# Patient Record
Sex: Male | Born: 1950
Health system: Southern US, Community
[De-identification: ages and names within clinical notes are randomized; demographics above are authoritative.]

## PROBLEM LIST (undated history)

## (undated) DIAGNOSIS — M1990 Unspecified osteoarthritis, unspecified site: Secondary | ICD-10-CM

## (undated) DIAGNOSIS — R05 Cough: Secondary | ICD-10-CM

## (undated) DIAGNOSIS — K219 Gastro-esophageal reflux disease without esophagitis: Secondary | ICD-10-CM

## (undated) DIAGNOSIS — E669 Obesity, unspecified: Secondary | ICD-10-CM

## (undated) DIAGNOSIS — J189 Pneumonia, unspecified organism: Secondary | ICD-10-CM

## (undated) DIAGNOSIS — E785 Hyperlipidemia, unspecified: Secondary | ICD-10-CM

## (undated) DIAGNOSIS — I10 Essential (primary) hypertension: Secondary | ICD-10-CM

## (undated) DIAGNOSIS — Z5189 Encounter for other specified aftercare: Secondary | ICD-10-CM

## (undated) DIAGNOSIS — J309 Allergic rhinitis, unspecified: Secondary | ICD-10-CM

## (undated) DIAGNOSIS — R7302 Impaired glucose tolerance (oral): Secondary | ICD-10-CM

## (undated) DIAGNOSIS — J45909 Unspecified asthma, uncomplicated: Secondary | ICD-10-CM

## (undated) HISTORY — DX: Unspecified osteoarthritis, unspecified site: M19.90

## (undated) HISTORY — DX: Hyperlipidemia, unspecified: E78.5

## (undated) HISTORY — PX: DG FEMUR LEFT  (ARMC HX): HXRAD1508

## (undated) HISTORY — PX: LUMBAR DISC SURGERY: SHX700

## (undated) HISTORY — DX: Gastro-esophageal reflux disease without esophagitis: K21.9

## (undated) HISTORY — PX: TOOTH EXTRACTION: SUR596

## (undated) HISTORY — DX: Unspecified asthma, uncomplicated: J45.909

## (undated) HISTORY — DX: Obesity, unspecified: E66.9

## (undated) HISTORY — PX: BACK SURGERY: SHX140

## (undated) HISTORY — DX: Impaired glucose tolerance (oral): R73.02

## (undated) HISTORY — DX: Pneumonia, unspecified organism: J18.9

## (undated) HISTORY — DX: Cough: R05

## (undated) HISTORY — DX: Encounter for other specified aftercare: Z51.89

## (undated) HISTORY — PX: COLONOSCOPY: SHX174

## (undated) HISTORY — DX: Allergic rhinitis, unspecified: J30.9

---

## 1999-02-11 ENCOUNTER — Encounter: Payer: Self-pay | Admitting: Specialist

## 1999-02-11 ENCOUNTER — Ambulatory Visit (HOSPITAL_COMMUNITY): Admission: RE | Admit: 1999-02-11 | Discharge: 1999-02-11 | Payer: Self-pay | Admitting: Specialist

## 1999-08-09 ENCOUNTER — Ambulatory Visit (HOSPITAL_COMMUNITY): Admission: RE | Admit: 1999-08-09 | Discharge: 1999-08-09 | Payer: Self-pay | Admitting: Gastroenterology

## 1999-08-16 ENCOUNTER — Ambulatory Visit (HOSPITAL_COMMUNITY): Admission: RE | Admit: 1999-08-16 | Discharge: 1999-08-16 | Payer: Self-pay | Admitting: Nurse Practitioner

## 1999-08-16 ENCOUNTER — Encounter: Payer: Self-pay | Admitting: Internal Medicine

## 1999-11-04 ENCOUNTER — Ambulatory Visit (HOSPITAL_COMMUNITY): Admission: RE | Admit: 1999-11-04 | Discharge: 1999-11-04 | Payer: Self-pay | Admitting: Gastroenterology

## 2003-02-11 ENCOUNTER — Encounter: Payer: Self-pay | Admitting: Emergency Medicine

## 2003-02-11 ENCOUNTER — Inpatient Hospital Stay (HOSPITAL_COMMUNITY): Admission: EM | Admit: 2003-02-11 | Discharge: 2003-02-18 | Payer: Self-pay | Admitting: Emergency Medicine

## 2003-02-11 ENCOUNTER — Encounter: Payer: Self-pay | Admitting: Specialist

## 2003-04-21 ENCOUNTER — Ambulatory Visit (HOSPITAL_BASED_OUTPATIENT_CLINIC_OR_DEPARTMENT_OTHER): Admission: RE | Admit: 2003-04-21 | Discharge: 2003-04-21 | Payer: Self-pay | Admitting: Specialist

## 2003-06-23 ENCOUNTER — Encounter: Payer: Self-pay | Admitting: Specialist

## 2003-06-23 ENCOUNTER — Observation Stay (HOSPITAL_COMMUNITY): Admission: RE | Admit: 2003-06-23 | Discharge: 2003-06-24 | Payer: Self-pay | Admitting: Specialist

## 2004-12-06 ENCOUNTER — Ambulatory Visit: Payer: Self-pay | Admitting: Internal Medicine

## 2004-12-31 ENCOUNTER — Ambulatory Visit: Payer: Self-pay | Admitting: Internal Medicine

## 2005-01-02 ENCOUNTER — Ambulatory Visit: Payer: Self-pay | Admitting: Internal Medicine

## 2005-01-06 ENCOUNTER — Ambulatory Visit: Payer: Self-pay | Admitting: Internal Medicine

## 2005-01-29 ENCOUNTER — Ambulatory Visit: Payer: Self-pay | Admitting: Gastroenterology

## 2005-02-14 ENCOUNTER — Ambulatory Visit: Payer: Self-pay | Admitting: Gastroenterology

## 2006-01-27 ENCOUNTER — Ambulatory Visit: Payer: Self-pay | Admitting: Internal Medicine

## 2006-02-26 ENCOUNTER — Ambulatory Visit: Payer: Self-pay | Admitting: Internal Medicine

## 2006-04-06 ENCOUNTER — Ambulatory Visit: Payer: Self-pay | Admitting: Internal Medicine

## 2006-06-09 ENCOUNTER — Ambulatory Visit: Payer: Self-pay | Admitting: Internal Medicine

## 2006-08-17 ENCOUNTER — Ambulatory Visit: Payer: Self-pay | Admitting: Gastroenterology

## 2006-08-22 ENCOUNTER — Ambulatory Visit: Payer: Self-pay | Admitting: Internal Medicine

## 2006-11-02 ENCOUNTER — Ambulatory Visit: Payer: Self-pay | Admitting: Internal Medicine

## 2007-07-18 ENCOUNTER — Encounter: Payer: Self-pay | Admitting: Internal Medicine

## 2007-07-18 DIAGNOSIS — R059 Cough, unspecified: Secondary | ICD-10-CM

## 2007-07-18 DIAGNOSIS — E669 Obesity, unspecified: Secondary | ICD-10-CM

## 2007-07-18 DIAGNOSIS — R05 Cough: Secondary | ICD-10-CM

## 2007-07-18 DIAGNOSIS — K219 Gastro-esophageal reflux disease without esophagitis: Secondary | ICD-10-CM

## 2007-07-18 DIAGNOSIS — M1990 Unspecified osteoarthritis, unspecified site: Secondary | ICD-10-CM

## 2007-07-18 HISTORY — DX: Gastro-esophageal reflux disease without esophagitis: K21.9

## 2007-07-18 HISTORY — DX: Cough, unspecified: R05.9

## 2007-07-18 HISTORY — DX: Unspecified osteoarthritis, unspecified site: M19.90

## 2007-07-18 HISTORY — DX: Obesity, unspecified: E66.9

## 2007-10-14 ENCOUNTER — Ambulatory Visit: Payer: Self-pay | Admitting: Internal Medicine

## 2007-10-14 DIAGNOSIS — J019 Acute sinusitis, unspecified: Secondary | ICD-10-CM

## 2007-12-31 ENCOUNTER — Ambulatory Visit: Payer: Self-pay | Admitting: Internal Medicine

## 2007-12-31 ENCOUNTER — Telehealth (INDEPENDENT_AMBULATORY_CARE_PROVIDER_SITE_OTHER): Payer: Self-pay | Admitting: *Deleted

## 2007-12-31 DIAGNOSIS — J309 Allergic rhinitis, unspecified: Secondary | ICD-10-CM | POA: Insufficient documentation

## 2007-12-31 DIAGNOSIS — L989 Disorder of the skin and subcutaneous tissue, unspecified: Secondary | ICD-10-CM | POA: Insufficient documentation

## 2007-12-31 HISTORY — DX: Allergic rhinitis, unspecified: J30.9

## 2008-01-01 LAB — CONVERTED CEMR LAB
ALT: 55 units/L — ABNORMAL HIGH (ref 0–53)
AST: 31 units/L (ref 0–37)
Alkaline Phosphatase: 86 units/L (ref 39–117)
BUN: 16 mg/dL (ref 6–23)
Basophils Relative: 0.8 % (ref 0.0–1.0)
Bilirubin Urine: NEGATIVE
CO2: 28 meq/L (ref 19–32)
Calcium: 9.4 mg/dL (ref 8.4–10.5)
Creatinine, Ser: 1.2 mg/dL (ref 0.4–1.5)
Hemoglobin: 14.3 g/dL (ref 13.0–17.0)
Leukocytes, UA: NEGATIVE
Monocytes Relative: 14 % — ABNORMAL HIGH (ref 3.0–11.0)
Nitrite: NEGATIVE
Platelets: 246 10*3/uL (ref 150–400)
RDW: 12.5 % (ref 11.5–14.6)
Specific Gravity, Urine: >=1.03 (ref 1.000–1.03)
Total Bilirubin: 0.6 mg/dL (ref 0.3–1.2)
Total Protein, Urine: NEGATIVE mg/dL
Total Protein: 6.5 g/dL (ref 6.0–8.3)
Triglycerides: 154 mg/dL — ABNORMAL HIGH (ref 0–149)
Urine Glucose: NEGATIVE mg/dL
VLDL: 31 mg/dL (ref 0–40)
WBC: 6.7 10*3/uL (ref 4.5–10.5)
pH: 5 (ref 5.0–8.0)

## 2008-01-03 ENCOUNTER — Encounter (INDEPENDENT_AMBULATORY_CARE_PROVIDER_SITE_OTHER): Payer: Self-pay | Admitting: *Deleted

## 2008-04-15 ENCOUNTER — Ambulatory Visit: Payer: Self-pay | Admitting: Internal Medicine

## 2008-06-27 ENCOUNTER — Ambulatory Visit: Payer: Self-pay | Admitting: Internal Medicine

## 2008-10-11 ENCOUNTER — Ambulatory Visit: Payer: Self-pay | Admitting: Internal Medicine

## 2009-02-19 ENCOUNTER — Telehealth: Payer: Self-pay | Admitting: Gastroenterology

## 2009-03-06 ENCOUNTER — Ambulatory Visit: Payer: Self-pay | Admitting: Gastroenterology

## 2009-03-21 ENCOUNTER — Telehealth: Payer: Self-pay | Admitting: Gastroenterology

## 2009-04-27 ENCOUNTER — Telehealth: Payer: Self-pay | Admitting: Gastroenterology

## 2009-08-09 ENCOUNTER — Ambulatory Visit: Payer: Self-pay | Admitting: Internal Medicine

## 2009-08-09 DIAGNOSIS — S91009A Unspecified open wound, unspecified ankle, initial encounter: Secondary | ICD-10-CM

## 2009-08-09 DIAGNOSIS — S81809A Unspecified open wound, unspecified lower leg, initial encounter: Secondary | ICD-10-CM

## 2009-08-09 DIAGNOSIS — S81009A Unspecified open wound, unspecified knee, initial encounter: Secondary | ICD-10-CM

## 2009-09-08 ENCOUNTER — Encounter: Payer: Self-pay | Admitting: Family Medicine

## 2009-09-08 ENCOUNTER — Ambulatory Visit: Payer: Self-pay | Admitting: Family Medicine

## 2009-09-08 DIAGNOSIS — J069 Acute upper respiratory infection, unspecified: Secondary | ICD-10-CM | POA: Insufficient documentation

## 2009-09-08 DIAGNOSIS — J45909 Unspecified asthma, uncomplicated: Secondary | ICD-10-CM

## 2009-09-08 HISTORY — DX: Unspecified asthma, uncomplicated: J45.909

## 2009-09-26 ENCOUNTER — Telehealth: Payer: Self-pay | Admitting: Internal Medicine

## 2009-09-28 ENCOUNTER — Ambulatory Visit: Payer: Self-pay | Admitting: Internal Medicine

## 2009-10-01 LAB — CONVERTED CEMR LAB
LDL Cholesterol: 128 mg/dL — ABNORMAL HIGH (ref 0–99)
VLDL: 20.6 mg/dL (ref 0.0–40.0)

## 2009-12-21 ENCOUNTER — Telehealth: Payer: Self-pay | Admitting: Internal Medicine

## 2010-08-13 ENCOUNTER — Ambulatory Visit: Payer: Self-pay | Admitting: Internal Medicine

## 2010-08-13 ENCOUNTER — Encounter (INDEPENDENT_AMBULATORY_CARE_PROVIDER_SITE_OTHER): Payer: Self-pay | Admitting: *Deleted

## 2010-08-13 DIAGNOSIS — R109 Unspecified abdominal pain: Secondary | ICD-10-CM

## 2010-08-14 ENCOUNTER — Ambulatory Visit: Payer: Self-pay | Admitting: Internal Medicine

## 2010-08-14 ENCOUNTER — Encounter (INDEPENDENT_AMBULATORY_CARE_PROVIDER_SITE_OTHER): Payer: Self-pay | Admitting: *Deleted

## 2010-08-14 DIAGNOSIS — IMO0002 Reserved for concepts with insufficient information to code with codable children: Secondary | ICD-10-CM

## 2010-08-14 DIAGNOSIS — E785 Hyperlipidemia, unspecified: Secondary | ICD-10-CM | POA: Insufficient documentation

## 2010-08-14 HISTORY — DX: Hyperlipidemia, unspecified: E78.5

## 2010-08-14 LAB — CONVERTED CEMR LAB
ALT: 31 units/L (ref 0–53)
AST: 22 units/L (ref 0–37)
BUN: 19 mg/dL (ref 6–23)
Bilirubin Urine: NEGATIVE
Bilirubin, Direct: 0.2 mg/dL (ref 0.0–0.3)
Eosinophils Relative: 1.9 % (ref 0.0–5.0)
GFR calc non Af Amer: 79.84 mL/min (ref >60)
HCT: 45.1 % (ref 39.0–52.0)
Hemoglobin, Urine: NEGATIVE
Ketones, ur: NEGATIVE mg/dL
Lymphs Abs: 2.2 10*3/uL (ref 0.7–4.0)
Monocytes Relative: 9 % (ref 3.0–12.0)
Platelets: 209 10*3/uL (ref 150.0–400.0)
Potassium: 4.1 meq/L (ref 3.5–5.1)
Sodium: 141 meq/L (ref 135–145)
Total Bilirubin: 0.8 mg/dL (ref 0.3–1.2)
Total Protein, Urine: NEGATIVE mg/dL
Urine Glucose: NEGATIVE mg/dL
WBC: 10.1 10*3/uL (ref 4.5–10.5)

## 2010-08-15 ENCOUNTER — Encounter: Payer: Self-pay | Admitting: Internal Medicine

## 2010-08-15 LAB — CONVERTED CEMR LAB
Cholesterol: 213 mg/dL — ABNORMAL HIGH (ref 0–200)
HDL: 43.3 mg/dL (ref >39.00)
Total CHOL/HDL Ratio: 5
Triglycerides: 166 mg/dL — ABNORMAL HIGH (ref 0.0–149.0)
VLDL: 33.2 mg/dL (ref 0.0–40.0)

## 2010-09-02 ENCOUNTER — Telehealth: Payer: Self-pay | Admitting: Internal Medicine

## 2010-10-21 ENCOUNTER — Ambulatory Visit: Payer: Self-pay | Admitting: Internal Medicine

## 2010-11-18 ENCOUNTER — Encounter: Payer: Self-pay | Admitting: Internal Medicine

## 2010-12-31 NOTE — Assessment & Plan Note (Signed)
Summary: COLD/ NWS   Vital Signs:  Patient profile:   60 year old male Height:      73 inches Weight:      229 pounds BMI:     30.32 O2 Sat:      96 % on Room air Temp:     98.5 degrees F oral Pulse rate:   79 / minute BP sitting:   118 / 80  (left arm) Cuff size:   large  Vitals Entered By: Zella Ball Ewing CMA (AAMA) (August 13, 2010 3:00 PM)  O2 Flow:  Room air CC: Cough, congestion headaches, sweating/RE   Primary Care Laurene Melendrez:  Oliver Barre, MD  CC:  Cough, congestion headaches, and sweating/RE.  History of Present Illness: here with acute illness for 2-3 days, mild to mod with headache, sinus pain, pressure, fever  and greneish d/c, general malaise and weakness, sweats, and today with increased prod cough grenish sputum;  Pt denies CP, worsening sob, doe, wheezing, orthopnea, pnd, worsening LE edema, palps, dizziness or syncope  Pt denies new neuro symptoms such as headache, facial or extremity weakness  Needs nexium refill - has recurrent daily reflux despite diet control and no dysphgia, n/v, abd pain, blood, except for fleeting pains to the LLQ, sharp without bowel change or blood.  Has known umbilical hernia without incr pain or swelling, and discomfort mild worse to walking on the treadmill;  No wt loss, night sweats, loss of appetite or other constitutional symptoms  Denies bladder changes  Problems Prior to Update: 1)  Abdominal Pain, Lower  (ICD-789.09) 2)  Bronchitis-acute  (ICD-466.0) 3)  Uri  (ICD-465.9) 4)  Extrinsic Asthma, Unspecified  (ICD-493.00) 5)  Viral Uri  (ICD-465.9) 6)  Open Wound of Knee Leg and Ankle Complicated  (ICD-891.1) 7)  Observation For Suspected Malignant Neoplasm  (ICD-V71.1) 8)  Skin Lesion  (ICD-709.9) 9)  Allergic Rhinitis  (ICD-477.9) 10)  Sinusitis- Acute-nos  (ICD-461.9) 11)  Preventive Health Care  (ICD-V70.0) 12)  Degenerative Joint Disease  (ICD-715.90) 13)  Obesity  (ICD-278.00) 14)  Gerd  (ICD-530.81) 15)  Cough, Chronic   (ICD-786.2)  Medications Prior to Update: 1)  Nexium 40 Mg  Cpdr (Esomeprazole Magnesium) .Marland Kitchen.. 1 By Mouth Qd 2)  Naproxen 500 Mg  Tabs (Naproxen) .Marland Kitchen.. 1 By Mouth Two Times A Day Prn 3)  Proventil Hfa 108 (90 Base) Mcg/act Aers (Albuterol Sulfate) .... Inhale 2 Puff As Directed Four Times A Day 4)  Fluticasone Propionate 50 Mcg/act  Susp (Fluticasone Propionate) .... 2 Spray/side Once Daily 5)  Robaxin 500 Mg Tabs (Methocarbamol) .... As Needed 6)  Fish Oil  Oil (Fish Oil) .... Take 1 Tablet By Mouth Once A Day 7)  Multivitamins  Tabs (Multiple Vitamin) .... Take 1 Tablet By Mouth Once A Day 8)  Glucosamine 1500 Complex  Caps (Glucosamine-Chondroit-Vit C-Mn) .... Take 1 Tablet By Mouth Once A Day 9)  Doxycycline Hyclate 100 Mg Caps (Doxycycline Hyclate) .Marland Kitchen.. 1 By Mouth Two Times A Day 10)  Prednisone 20 Mg Tabs (Prednisone) .... Uad 11)  Hydromet 5-1.5 Mg/53ml Syrp (Hydrocodone-Homatropine) .Marland Kitchen.. 1 or 2 Tsps Three Times A Day As Needed Cough  Current Medications (verified): 1)  Nexium 40 Mg  Cpdr (Esomeprazole Magnesium) .Marland Kitchen.. 1 By Mouth Once Daily 2)  Naproxen 500 Mg  Tabs (Naproxen) .Marland Kitchen.. 1 By Mouth Two Times A Day Prn 3)  Proventil Hfa 108 (90 Base) Mcg/act Aers (Albuterol Sulfate) .... Inhale 2 Puff As Directed Four Times A Day 4)  Fluticasone Propionate 50 Mcg/act  Susp (Fluticasone Propionate) .... 2 Spray/side Once Daily 5)  Robaxin 500 Mg Tabs (Methocarbamol) .... As Needed 6)  Fish Oil  Oil (Fish Oil) .... Take 1 Tablet By Mouth Once A Day 7)  Multivitamins  Tabs (Multiple Vitamin) .... Take 1 Tablet By Mouth Once A Day 8)  Glucosamine 1500 Complex  Caps (Glucosamine-Chondroit-Vit C-Mn) .... Take 1 Tablet By Mouth Once A Day 9)  Doxycycline Hyclate 100 Mg Caps (Doxycycline Hyclate) .Marland Kitchen.. 1 By Mouth Two Times A Day 10)  Hydromet 5-1.5 Mg/24ml Syrp (Hydrocodone-Homatropine) .Marland Kitchen.. 1 or 2 Tsps Three Times A Day As Needed Cough  Allergies (verified): No Known Drug Allergies  Past  History:  Past Medical History: Last updated: 04/15/2008 Chronic Cough GERD Obesity DJD Allergic rhinitis hx pneumonia  Past Surgical History: Last updated: 03/06/2009 Rods in L Leg-2004 L Knee Surgery- 2004 R Knee Surgery-1970  Social History: Last updated: 03/06/2009 Former Smoker-Marijuana 30 years Alcohol use-yes Married, lives with wife and: 1 daughter and children live with him Daily Caffeine Use coffee in AM Illicit Drug Use - no  Risk Factors: Smoking Status: quit (10/14/2007)  Review of Systems       all otherwise negative per pt -    Physical Exam  General:  alert and overweight-appearing, mild ill .   Head:  normocephalic and atraumatic.   Eyes:  vision grossly intact, pupils equal, and pupils round.   Ears:  bilat tms' mild erythena, sinus tender bilat max area Nose:  nasal dischargemucosal pallor and mucosal edema.   Mouth:  pharyngeal erythema and fair dentition.   Neck:  supple and no masses.   Lungs:  normal respiratory effort and normal breath sounds.   Heart:  normal rate and regular rhythm.   Abdomen:  soft, non-tender, and normal bowel sounds.  including NT and reducible umbilical hernia Msk:  no joint tenderness and no joint swelling.   Extremities:  no edema, no erythema    Impression & Recommendations:  Problem # 1:  SINUSITIS- ACUTE-NOS (ICD-461.9)  His updated medication list for this problem includes:    Fluticasone Propionate 50 Mcg/act Susp (Fluticasone propionate) .Marland Kitchen... 2 spray/side once daily    Doxycycline Hyclate 100 Mg Caps (Doxycycline hyclate) .Marland Kitchen... 1 by mouth two times a day    Hydromet 5-1.5 Mg/20ml Syrp (Hydrocodone-homatropine) .Marland Kitchen... 1 or 2 tsps three times a day as needed cough treat as above, f/u any worsening signs or symptoms , also for antibx  Problem # 2:  BRONCHITIS-ACUTE (ICD-466.0)  His updated medication list for this problem includes:    Proventil Hfa 108 (90 Base) Mcg/act Aers (Albuterol sulfate) .....  Inhale 2 puff as directed four times a day    Doxycycline Hyclate 100 Mg Caps (Doxycycline hyclate) .Marland Kitchen... 1 by mouth two times a day    Hydromet 5-1.5 Mg/31ml Syrp (Hydrocodone-homatropine) .Marland Kitchen... 1 or 2 tsps three times a day as needed cough  Orders: T-2 View CXR, Same Day (71020.5TC) cant r/o pna - for cxr, and treat as above, f/u any worsening signs or symptoms   Problem # 3:  ABDOMINAL PAIN, LOWER (ICD-789.09)  His updated medication list for this problem includes:    Naproxen 500 Mg Tabs (Naproxen) .Marland Kitchen... 1 by mouth two times a day prn    Robaxin 500 Mg Tabs (Methocarbamol) .Marland Kitchen... As needed mid and lower - no GI/GU symptoms - ? msk - no sign of hernia except for the umbilical - ok to follow for now  Complete Medication List: 1)  Nexium 40 Mg Cpdr (Esomeprazole magnesium) .Marland Kitchen.. 1 by mouth once daily 2)  Naproxen 500 Mg Tabs (Naproxen) .Marland Kitchen.. 1 by mouth two times a day prn 3)  Proventil Hfa 108 (90 Base) Mcg/act Aers (Albuterol sulfate) .... Inhale 2 puff as directed four times a day 4)  Fluticasone Propionate 50 Mcg/act Susp (Fluticasone propionate) .... 2 spray/side once daily 5)  Robaxin 500 Mg Tabs (Methocarbamol) .... As needed 6)  Fish Oil Oil (Fish oil) .... Take 1 tablet by mouth once a day 7)  Multivitamins Tabs (Multiple vitamin) .... Take 1 tablet by mouth once a day 8)  Glucosamine 1500 Complex Caps (Glucosamine-chondroit-vit c-mn) .... Take 1 tablet by mouth once a day 9)  Doxycycline Hyclate 100 Mg Caps (Doxycycline hyclate) .Marland Kitchen.. 1 by mouth two times a day 10)  Hydromet 5-1.5 Mg/91ml Syrp (Hydrocodone-homatropine) .Marland Kitchen.. 1 or 2 tsps three times a day as needed cough  Patient Instructions: 1)  Please take all new medications as prescribed 2)  Continue all previous medications as before this visit  3)  Please go to Radiology in the basement level for your X-Ray today  4)  Continue all previous medications as before this visit  5)  Please call the number on the [facility] Card for  results of your testing  6)  Please schedule a follow-up appointment as needed. 7)  You had the flu shot today, and the nexium refill Prescriptions: NEXIUM 40 MG  CPDR (ESOMEPRAZOLE MAGNESIUM) 1 by mouth once daily  #90 x 3   Entered and Authorized by:   Corwin Levins MD   Signed by:   Corwin Levins MD on 08/13/2010   Method used:   Electronically to        [facility] [id number] (retail)       [address] [place], [place]  [address]       Ph: [phone]       Fax: [phone]   RxID:   [id number] HYDROMET 5-1.5 MG/5ML SYRP (HYDROCODONE-HOMATROPINE) 1 or 2 tsps three times a day as needed cough  #6oz x 1   Entered and Authorized by:   Corwin Levins MD   Signed by:   Corwin Levins MD on 08/13/2010   Method used:   Print then Give to Patient   RxID:   [id number] DOXYCYCLINE HYCLATE 100 MG CAPS (DOXYCYCLINE HYCLATE) 1 by mouth two times a day  #20 x 0   Entered and Authorized by:   Corwin Levins MD   Signed by:   Corwin Levins MD on 08/13/2010   Method used:   Print then Give to Patient   RxID:   [id number]

## 2010-12-31 NOTE — Progress Notes (Signed)
Summary: Rx refill req  Phone Note Call from Patient Call back at Home Phone [phone]   Caller: Patient Call For: Corwin Levins MD Summary of Call: Pt requests Nexium. Pt states his GI M.D told him Dr Jonny Ruiz would need to fill this. [facility] for 90 days. [phone] phone/or fax. Initial call taken by: Verdell Face,  September 02, 2010 2:22 PM    Prescriptions: NEXIUM 40 MG  CPDR (ESOMEPRAZOLE MAGNESIUM) 1 by mouth once daily  #90 x 3   Entered by:   Margaret Pyle, CMA   Authorized by:   Corwin Levins MD   Signed by:   Margaret Pyle, CMA on 09/02/2010   Method used:   Faxed to ...       [facility] (mail-order)             , [place]         Ph: [phone]       Fax: [phone]   RxID:   [id number]

## 2010-12-31 NOTE — Letter (Signed)
Summary: Out of Work  [facility]  [address] [place], [place] [address]   Phone: [phone]  Fax: [phone]    August 13, 2010   Employee:  [name] Brainerd    To Whom It May Concern:   For Medical reasons, please excuse the above named employee from work for the following dates:  Start:   08/13/2010  End:   08/14/2010  Return to work on 08/15/2010  If you need additional information, please feel free to contact our office.         Sincerely,    Dr. Oliver Barre

## 2010-12-31 NOTE — Assessment & Plan Note (Signed)
Summary: FEVER--NOT BETTER SAW DR Sammuel Cooper YESTERDAY--[facility]   Vital Signs:  Patient profile:   60 year old male Height:      73 inches Weight:      228 pounds BMI:     30.19 O2 Sat:      98 % on Room air Temp:     97.8 degrees F oral Pulse rate:   83 / minute BP sitting:   120 / 60  (left arm) Cuff size:   large  Vitals Entered By: Zella Ball Ewing CMA Duncan Dull) (August 14, 2010 3:53 PM)  O2 Flow:  Room air CC: Nausea, dizzy, fever/RE   Primary Care Provider:  Oliver Barre, MD  CC:  Nausea, dizzy, and fever/RE.  History of Present Illness: here to f/u;  to me seems more agitated nervous state but he denies this;  very fidgety, cant sit still  - "I'm burning up" though no fever, rash, swelling, tongue sweling, sob, abd pain , v/d.  States overall cough improved, but nausea and just "feeling bad" causing him significant distress.  Pt denies CP, worsening sob, doe, wheezing, orthopnea, pnd, worsening LE edema, palps, dizziness or syncope  Pt denies new neuro symptoms such as headache, facial or extremity weakness  No wt loss, night sweats, loss of appetite or other constitutional symptoms but does have lower appetitie.   Problems Prior to Update: 1)  Hyperlipidemia  (ICD-272.4) 2)  Agitation  (ICD-307.9) 3)  Abdominal Pain, Lower  (ICD-789.09) 4)  Bronchitis-acute  (ICD-466.0) 5)  Uri  (ICD-465.9) 6)  Extrinsic Asthma, Unspecified  (ICD-493.00) 7)  Viral Uri  (ICD-465.9) 8)  Open Wound of Knee Leg and Ankle Complicated  (ICD-891.1) 9)  Observation For Suspected Malignant Neoplasm  (ICD-V71.1) 10)  Skin Lesion  (ICD-709.9) 11)  Allergic Rhinitis  (ICD-477.9) 12)  Sinusitis- Acute-nos  (ICD-461.9) 13)  Preventive Health Care  (ICD-V70.0) 14)  Degenerative Joint Disease  (ICD-715.90) 15)  Obesity  (ICD-278.00) 16)  Gerd  (ICD-530.81) 17)  Cough, Chronic  (ICD-786.2)  Medications Prior to Update: 1)  Nexium 40 Mg  Cpdr (Esomeprazole Magnesium) .Marland Kitchen.. 1 By Mouth Once Daily 2)  Naproxen 500  Mg  Tabs (Naproxen) .Marland Kitchen.. 1 By Mouth Two Times A Day Prn 3)  Proventil Hfa 108 (90 Base) Mcg/act Aers (Albuterol Sulfate) .... Inhale 2 Puff As Directed Four Times A Day 4)  Fluticasone Propionate 50 Mcg/act  Susp (Fluticasone Propionate) .... 2 Spray/side Once Daily 5)  Robaxin 500 Mg Tabs (Methocarbamol) .... As Needed 6)  Fish Oil  Oil (Fish Oil) .... Take 1 Tablet By Mouth Once A Day 7)  Multivitamins  Tabs (Multiple Vitamin) .... Take 1 Tablet By Mouth Once A Day 8)  Glucosamine 1500 Complex  Caps (Glucosamine-Chondroit-Vit C-Mn) .... Take 1 Tablet By Mouth Once A Day 9)  Doxycycline Hyclate 100 Mg Caps (Doxycycline Hyclate) .Marland Kitchen.. 1 By Mouth Two Times A Day 10)  Hydromet 5-1.5 Mg/22ml Syrp (Hydrocodone-Homatropine) .Marland Kitchen.. 1 or 2 Tsps Three Times A Day As Needed Cough  Current Medications (verified): 1)  Nexium 40 Mg  Cpdr (Esomeprazole Magnesium) .Marland Kitchen.. 1 By Mouth Once Daily 2)  Naproxen 500 Mg  Tabs (Naproxen) .Marland Kitchen.. 1 By Mouth Two Times A Day Prn 3)  Proventil Hfa 108 (90 Base) Mcg/act Aers (Albuterol Sulfate) .... Inhale 2 Puff As Directed Four Times A Day 4)  Fluticasone Propionate 50 Mcg/act  Susp (Fluticasone Propionate) .... 2 Spray/side Once Daily 5)  Robaxin 500 Mg Tabs (Methocarbamol) .... As Needed 6)  Fish Oil  Oil (Fish Oil) .... Take 1 Tablet By Mouth Once A Day 7)  Multivitamins  Tabs (Multiple Vitamin) .... Take 1 Tablet By Mouth Once A Day 8)  Glucosamine 1500 Complex  Caps (Glucosamine-Chondroit-Vit C-Mn) .... Take 1 Tablet By Mouth Once A Day 9)  Hydromet 5-1.5 Mg/64ml Syrp (Hydrocodone-Homatropine) .Marland Kitchen.. 1 or 2 Tsps Three Times A Day As Needed Cough 10)  Azithromycin 250 Mg Tabs (Azithromycin) .... 2po Qd For 1 Day, Then 1po Qd For 4days, Then Stop 11)  Promethazine Hcl 25 Mg Tabs (Promethazine Hcl) .Marland Kitchen.. 1 By Mouth Q 6 Hrs As Needed Nausea  Allergies (verified): No Known Drug Allergies  Past History:  Past Medical History: Last updated: 04/15/2008 Chronic  Cough GERD Obesity DJD Allergic rhinitis hx pneumonia  Past Surgical History: Last updated: 03/06/2009 Rods in L Leg-2004 L Knee Surgery- 2004 R Knee Surgery-1970  Social History: Last updated: 03/06/2009 Former Smoker-Marijuana 30 years Alcohol use-yes Married, lives with wife and: 1 daughter and children live with him Daily Caffeine Use coffee in AM Illicit Drug Use - no  Risk Factors: Smoking Status: quit (10/14/2007)  Review of Systems       all otherwise negative per pt -    Physical Exam  General:  alert and overweight-appearing.  , agitated and animated, cant seem to sit still,keeps changing position such as sitting , lying on his front or back Head:  normocephalic and atraumatic.   Eyes:  vision grossly intact, pupils equal, and pupils round.   Ears:  bilat tm's mild erythema, sinus nontender Nose:  no external deformity and no nasal discharge.   Mouth:  no gingival abnormalities and pharyngeal erythema.   Neck:  supple and no masses.   Lungs:  normal respiratory effort and normal breath sounds.   Heart:  normal rate and regular rhythm.   Abdomen:  soft, non-tender, normal bowel sounds, no distention, and no guarding.   Msk:  no joint tenderness and no joint swelling.   Extremities:  no edema, no erythema  Skin:  color normal and no rashes.   Psych:  as above   Impression & Recommendations:  Problem # 1:  BRONCHITIS-ACUTE (ICD-466.0)  The following medications were removed from the medication list:    Doxycycline Hyclate 100 Mg Caps (Doxycycline hyclate) .Marland Kitchen... 1 by mouth two times a day His updated medication list for this problem includes:    Proventil Hfa 108 (90 Base) Mcg/act Aers (Albuterol sulfate) ..... Inhale 2 puff as directed four times a day    Hydromet 5-1.5 Mg/76ml Syrp (Hydrocodone-homatropine) .Marland Kitchen... 1 or 2 tsps three times a day as needed cough    Azithromycin 250 Mg Tabs (Azithromycin) .Marland Kitchen... 2po qd for 1 day, then 1po qd for 4days, then  stop ? improved - to Continue all previous medications as before this visit except stop the doxy with the c/o nausea, start the zpack , cxr reviewed with pt., f/u any worsening symptoms  Problem # 2:  ABDOMINAL PAIN, LOWER (ICD-789.09)  His updated medication list for this problem includes:    Naproxen 500 Mg Tabs (Naproxen) .Marland Kitchen... 1 by mouth two times a day prn    Robaxin 500 Mg Tabs (Methocarbamol) .Marland Kitchen... As needed exam essentially no change but with the nausea and feeling overall somehow worse today with "feverish" and nausea - will check labs, urine cx, blood cx;  ok for tylenol/advil as needed   Orders: T-Culture, Urine ([id number]) T-Blood Culture ([id number]) TLB-BMP (Basic Metabolic Panel-BMET) (80048-METABOL)  TLB-CBC Platelet - w/Differential (85025-CBCD) TLB-Hepatic/Liver Function Pnl (80076-HEPATIC) TLB-Udip w/ Micro (81001-URINE) TLB-Lipase (83690-LIPASE)  Problem # 3:  AGITATION (ICD-307.9) cannot seem to sit still during the exam, "I'm burning up" - no fever today in the office, exam little change, ? anxiety - declines further med trial  Complete Medication List: 1)  Nexium 40 Mg Cpdr (Esomeprazole magnesium) .Marland Kitchen.. 1 by mouth once daily 2)  Naproxen 500 Mg Tabs (Naproxen) .Marland Kitchen.. 1 by mouth two times a day prn 3)  Proventil Hfa 108 (90 Base) Mcg/act Aers (Albuterol sulfate) .... Inhale 2 puff as directed four times a day 4)  Fluticasone Propionate 50 Mcg/act Susp (Fluticasone propionate) .... 2 spray/side once daily 5)  Robaxin 500 Mg Tabs (Methocarbamol) .... As needed 6)  Fish Oil Oil (Fish oil) .... Take 1 tablet by mouth once a day 7)  Multivitamins Tabs (Multiple vitamin) .... Take 1 tablet by mouth once a day 8)  Glucosamine 1500 Complex Caps (Glucosamine-chondroit-vit c-mn) .... Take 1 tablet by mouth once a day 9)  Hydromet 5-1.5 Mg/33ml Syrp (Hydrocodone-homatropine) .Marland Kitchen.. 1 or 2 tsps three times a day as needed cough 10)  Azithromycin 250 Mg Tabs (Azithromycin) ....  2po qd for 1 day, then 1po qd for 4days, then stop 11)  Promethazine Hcl 25 Mg Tabs (Promethazine hcl) .Marland Kitchen.. 1 by mouth q 6 hrs as needed nausea  Other Orders: TLB-Lipid Panel (80061-LIPID)  Patient Instructions: 1)  Please take all new medications as prescribed - the  antibx, and nausea medication 2)  Ok to take tylenol or advil for feverishness 3)  Continue all previous medications as before this visit , except should stop the doxycycline as this can cause nausea 4)  Please go to the Lab in the basement for your blood and/or urine tests today 5)  /Please call the number on the [facility] Card for results of your testing 6)  Please schedule a follow-up appointment as needed. Prescriptions: PROMETHAZINE HCL 25 MG TABS (PROMETHAZINE HCL) 1 by mouth q 6 hrs as needed nausea  #40 x 1   Entered and Authorized by:   Corwin Levins MD   Signed by:   Corwin Levins MD on 08/14/2010   Method used:   Print then Give to Patient   RxID:   [id number] AZITHROMYCIN 250 MG TABS (AZITHROMYCIN) 2po qd for 1 day, then 1po qd for 4days, then stop  #6 x 1   Entered and Authorized by:   Corwin Levins MD   Signed by:   Corwin Levins MD on 08/14/2010   Method used:   Print then Give to Patient   RxID:   [id number]

## 2010-12-31 NOTE — Assessment & Plan Note (Signed)
Summary: head congestion/cd   Vital Signs:  Patient profile:   60 year old male Height:      73 inches Weight:      234 pounds BMI:     30.98 O2 Sat:      97 % on Room air Temp:     98.8 degrees F oral Pulse rate:   72 / minute BP sitting:   118 / 80  (left arm) Cuff size:   large  Vitals Entered By: Zella Ball Ewing CMA Duncan Dull) (October 21, 2010 4:53 PM)  O2 Flow:  Room air  CC: head Congestion, sore throat, ear pain/RE   Primary Care Provider:  Oliver Barre, MD  CC:  head Congestion, sore throat, and ear pain/RE.  History of Present Illness: here with 3 days onset facial pain, pressure, fever and greenish d/c mild to mod, with slight ST, but Pt denies CP, worsening sob, doe, wheezing, orthopnea, pnd, worsening LE edema, palps, dizziness or syncope  Pt denies new neuro symptoms such as headache, facial or extremity weakness  Pt denies polydipsia, polyuria.  Problems Prior to Update: 1)  Sinusitis- Acute-nos  (ICD-461.9) 2)  Hyperlipidemia  (ICD-272.4) 3)  Agitation  (ICD-307.9) 4)  Abdominal Pain, Lower  (ICD-789.09) 5)  Uri  (ICD-465.9) 6)  Extrinsic Asthma, Unspecified  (ICD-493.00) 7)  Viral Uri  (ICD-465.9) 8)  Open Wound of Knee Leg and Ankle Complicated  (ICD-891.1) 9)  Observation For Suspected Malignant Neoplasm  (ICD-V71.1) 10)  Skin Lesion  (ICD-709.9) 11)  Allergic Rhinitis  (ICD-477.9) 12)  Sinusitis- Acute-nos  (ICD-461.9) 13)  Preventive Health Care  (ICD-V70.0) 14)  Degenerative Joint Disease  (ICD-715.90) 15)  Obesity  (ICD-278.00) 16)  Gerd  (ICD-530.81) 17)  Cough, Chronic  (ICD-786.2)  Medications Prior to Update: 1)  Nexium 40 Mg  Cpdr (Esomeprazole Magnesium) .Marland Kitchen.. 1 By Mouth Once Daily 2)  Naproxen 500 Mg  Tabs (Naproxen) .Marland Kitchen.. 1 By Mouth Two Times A Day Prn 3)  Proventil Hfa 108 (90 Base) Mcg/act Aers (Albuterol Sulfate) .... Inhale 2 Puff As Directed Four Times A Day 4)  Fluticasone Propionate 50 Mcg/act  Susp (Fluticasone Propionate) .... 2  Spray/side Once Daily 5)  Robaxin 500 Mg Tabs (Methocarbamol) .... As Needed 6)  Fish Oil  Oil (Fish Oil) .... Take 1 Tablet By Mouth Once A Day 7)  Multivitamins  Tabs (Multiple Vitamin) .... Take 1 Tablet By Mouth Once A Day 8)  Glucosamine 1500 Complex  Caps (Glucosamine-Chondroit-Vit C-Mn) .... Take 1 Tablet By Mouth Once A Day 9)  Hydromet 5-1.5 Mg/24ml Syrp (Hydrocodone-Homatropine) .Marland Kitchen.. 1 or 2 Tsps Three Times A Day As Needed Cough 10)  Azithromycin 250 Mg Tabs (Azithromycin) .... 2po Qd For 1 Day, Then 1po Qd For 4days, Then Stop 11)  Promethazine Hcl 25 Mg Tabs (Promethazine Hcl) .Marland Kitchen.. 1 By Mouth Q 6 Hrs As Needed Nausea  Current Medications (verified): 1)  Nexium 40 Mg  Cpdr (Esomeprazole Magnesium) .Marland Kitchen.. 1 By Mouth Once Daily 2)  Naproxen 500 Mg  Tabs (Naproxen) .Marland Kitchen.. 1 By Mouth Two Times A Day Prn 3)  Fluticasone Propionate 50 Mcg/act  Susp (Fluticasone Propionate) .... 2 Spray/side Once Daily 4)  Robaxin 500 Mg Tabs (Methocarbamol) .... As Needed 5)  Fish Oil  Oil (Fish Oil) .... Take 1 Tablet By Mouth Once A Day 6)  Glucosamine 1500 Complex  Caps (Glucosamine-Chondroit-Vit C-Mn) .... Take 1 Tablet By Mouth Once A Day 7)  Hydromet 5-1.5 Mg/78ml Syrp (Hydrocodone-Homatropine) .Marland Kitchen.. 1 or 2  Tsps Three Times A Day As Needed Cough 8)  Levofloxacin 500 Mg Tabs (Levofloxacin) .Marland Kitchen.. 1po Once Daily  Allergies (verified): No Known Drug Allergies  Past History:  Past Medical History: Last updated: 04/15/2008 Chronic Cough GERD Obesity DJD Allergic rhinitis hx pneumonia  Past Surgical History: Last updated: 03/06/2009 Rods in L Leg-2004 L Knee Surgery- 2004 R Knee Surgery-1970  Social History: Last updated: 03/06/2009 Former Smoker-Marijuana 30 years Alcohol use-yes Married, lives with wife and: 1 daughter and children live with him Daily Caffeine Use coffee in AM Illicit Drug Use - no  Risk Factors: Smoking Status: quit (10/14/2007)  Review of Systems       all  otherwise negative per pt -    Physical Exam  General:  alert and overweight-appearing.  , mild ill  Head:  normocephalic and atraumatic.   Eyes:  vision grossly intact, pupils equal, and pupils round.   Ears:  bilat tm's red, sinus tender bialt Nose:  nasal dischargemucosal pallor and mucosal edema.   Mouth:  pharyngeal erythema and fair dentition.   Neck:  supple and no masses.   Lungs:  normal respiratory effort and normal breath sounds.   Heart:  normal rate and regular rhythm.   Extremities:  no edema, no erythema    Impression & Recommendations:  Problem # 1:  SINUSITIS- ACUTE-NOS (ICD-461.9)  The following medications were removed from the medication list:    Azithromycin 250 Mg Tabs (Azithromycin) .Marland Kitchen... 2po qd for 1 day, then 1po qd for 4days, then stop His updated medication list for this problem includes:    Fluticasone Propionate 50 Mcg/act Susp (Fluticasone propionate) .Marland Kitchen... 2 spray/side once daily    Hydromet 5-1.5 Mg/61ml Syrp (Hydrocodone-homatropine) .Marland Kitchen... 1 or 2 tsps three times a day as needed cough    Levofloxacin 500 Mg Tabs (Levofloxacin) .Marland Kitchen... 1po once daily treat as above, f/u any worsening signs or symptoms   Complete Medication List: 1)  Nexium 40 Mg Cpdr (Esomeprazole magnesium) .Marland Kitchen.. 1 by mouth once daily 2)  Naproxen 500 Mg Tabs (Naproxen) .Marland Kitchen.. 1 by mouth two times a day prn 3)  Fluticasone Propionate 50 Mcg/act Susp (Fluticasone propionate) .... 2 spray/side once daily 4)  Robaxin 500 Mg Tabs (Methocarbamol) .... As needed 5)  Fish Oil Oil (Fish oil) .... Take 1 tablet by mouth once a day 6)  Glucosamine 1500 Complex Caps (Glucosamine-chondroit-vit c-mn) .... Take 1 tablet by mouth once a day 7)  Hydromet 5-1.5 Mg/37ml Syrp (Hydrocodone-homatropine) .Marland Kitchen.. 1 or 2 tsps three times a day as needed cough 8)  Levofloxacin 500 Mg Tabs (Levofloxacin) .Marland Kitchen.. 1po once daily  Patient Instructions: 1)  Please take all new medications as prescribed  2)  Continue all  previous medications as before this visit 3)  You can also use Mucinex OTC or it's generic for congestion  4)  Please schedule a follow-up appointment as needed. Prescriptions: LEVOFLOXACIN 500 MG TABS (LEVOFLOXACIN) 1po once daily  #10 x 0   Entered and Authorized by:   Corwin Levins MD   Signed by:   Corwin Levins MD on 10/21/2010   Method used:   Print then Give to Patient   RxID:   [id number]    Orders Added: 1)  Est. Patient Level III [95093]

## 2010-12-31 NOTE — Progress Notes (Signed)
  Phone Note Refill Request  on December 21, 2009 8:31 AM  Refills Requested: Medication #1:  PROVENTIL HFA 108 (90 BASE) MCG/ACT AERS Inhale 2 puff as directed four times a day   Dosage confirmed as above?Dosage Confirmed   Notes: [facility] Initial call taken by: Scharlene Gloss,  December 21, 2009 8:31 AM    Prescriptions: FLUTICASONE PROPIONATE 50 MCG/ACT  SUSP (FLUTICASONE PROPIONATE) 2 spray/side once daily  #1 x 10   Entered by:   Scharlene Gloss   Authorized by:   Corwin Levins MD   Signed by:   Scharlene Gloss on 12/21/2009   Method used:   Faxed to ...       [facility]* (mail-order)             ,          Ph: [phone]       Fax: [phone]   RxID:   [id number]

## 2010-12-31 NOTE — Letter (Signed)
Summary: Out of Work  [facility]  [address] [place], [place] [address]   Phone: [phone]  Fax: [phone]    August 14, 2010   Employee:  EWART CARRERA Dubach    To Whom It May Concern:   For Medical reasons, please excuse the above named employee from work for the following dates:  Start:   08/12/2010  End:   08/15/2010  Return to work on Friday 08/16/2010  If you need additional information, please feel free to contact our office.         Sincerely,    Dr. Oliver Barre

## 2011-01-02 NOTE — Letter (Signed)
Summary: Primary Care Appointment Letter  [facility]  [address] [place], [place] [address]   Phone: [phone]  Fax: [phone]    11/18/2010 MRN: [id number]  [facility] [address] [place], [place]  [address]  Dear Mr. Shuttleworth,   Your Primary Care Physician Corwin Levins MD has indicated that:    ____X___it is time to schedule an appointment with Dr. Jonny Ruiz for your physical with labs in June 2012.  Please call the office to schedule.    _______you missed your appointment on______ and need to call and          reschedule.    _______you need to have lab work done.    _______you need to schedule an appointment discuss lab or test results.    _______you need to call to reschedule your appointment that is                       scheduled on _________.     Please call our office as soon as possible. Our phone number is [phone]. Please press option 1. Our office is open 8a-12noon and 1p-5p, Monday through Friday.     Thank you,    [facility]

## 2011-03-04 ENCOUNTER — Other Ambulatory Visit: Payer: Self-pay | Admitting: Internal Medicine

## 2011-04-18 NOTE — H&P (Signed)
NAME:  [name], [name]                         ACCOUNT NO.:  [id number]   MEDICAL RECORD NO.:  [id number]                   PATIENT TYPE:  INP   LOCATION:  0478                                 FACILITY:  [facility]   PHYSICIAN:  Ronnell Guadalajara, M.D.                DATE OF BIRTH:  [birth year]   DATE OF ADMISSION:  02/11/2003  DATE OF DISCHARGE:                                HISTORY & PHYSICAL   CHIEF COMPLAINT:  Pain in my left thigh.   HISTORY OF PRESENT ILLNESS:  This 60 year old white male was on his property  cutting some trees.  Apparently he cut a smaller tree which struck a larger  tree and somehow his left thigh became caught between the trees.  He had  immediate pain in the left thigh area.  He called a neighbor who called an  ambulance and he was brought to the [facility] emergency room for evaluation.  He  is very stable.  He had no other injuries other than an abrasion over the  left knee.  Did not lose consciousness.  He was in a considerable amount of  pain and discomfort.  It was known that there were several cases at [facility]  that preceded his emergent surgery so we transferred him to [facility] for admission and intermedullary nail to the left femur.   Neurovascularly he is intact to the left lower extremity.  Pulses were  excellent in the dorsalis pedis and posterior tibial pulses.  The patient  was in [place] traction since being picked up by the EMS.   PAST MEDICAL HISTORY:  This gentleman has been in good health throughout his  lifetime. He has had no serious major surgeries.  He currently has GERD.  He  had some right knee surgery in the distant past.   MEDICATIONS:  1. Nexium.  2. Mobic.   ALLERGIES:  No known drug allergies.   PRIMARY CARE PHYSICIAN:  Dr. Alwyn Ren is his medical doctor.   SOCIAL HISTORY AND FAMILY HISTORY:  Noncontributory.   REVIEW OF SYSTEMS:  CNS:  No seizures, stroke or paralysis, blindness,  double vision.  RESPIRATORY:  No productive  cough, no hemoptysis, no  shortness of breath.  CARDIOVASCULAR:  No chest pain, no angina, no  orthopnea.  GASTROINTESTINAL:  No nausea, vomiting, diarrhea, melena, or  bloody stools.  GENITOURINARY:  No discharge, dysuria, or hematuria.  MUSCULOSKELETAL:  Primarily in the present illness.   PHYSICAL EXAMINATION:  GENERAL:  Alert and cooperative, friendly 60 year old  white male.  VITAL SIGNS:  Blood pressure 101/69, pulse 88, respirations 18, temperature  97.7.  HEENT:  Normocephalic.  Oropharynx is clear.  CHEST:  Clear to auscultation.  No rhonchi or rales.  HEART:  Regular rate and rhythm.  No murmurs are heard.  ABDOMEN:  Soft, nontender.  Liver, spleen not felt.  GENITALIA AND RECTAL:  Not done and not pertinent  to the present illness.  EXTREMITIES:  The patient's left lower extremity is in [place] traction.  He  has good pulses.  His sensation is intact to the foot.   ADMISSION DIAGNOSES:  1. Midshaft femoral fracture of the left leg.  2. Gastroesophageal reflux disease.  3. Osteoarthritis of the right knee.   PLAN:  The patient will be admitted for IM nail of the left femur.     Dooley L. Cherlynn June.                 Ronnell Guadalajara, M.D.    DLU/MEDQ  D:  02/11/2003  T:  02/12/2003  Job:  [id number]   cc:   Titus Dubin. Alwyn Ren, M.D. [facility]

## 2011-04-18 NOTE — H&P (Signed)
NAME:  [name], [name]                         ACCOUNT NO.:  [id number]   MEDICAL RECORD NO.:  [id number]                   PATIENT TYPE:  AMB   LOCATION:  DAY                                  FACILITY:  [facility]   PHYSICIAN:  Ronnell Guadalajara, M.D.                DATE OF BIRTH:  [birth year]   DATE OF ADMISSION:  DATE OF DISCHARGE:                                HISTORY & PHYSICAL   CHIEF COMPLAINT:  Pain in my left leg and buttocks.Marland Kitchen   HISTORY OF PRESENT ILLNESS:  The patient is a 60 year old white male who has  been seen by Dr. Montez Morita for continuing and progressive problems concerning  his left leg.  The patient has had pain into the left buttocks with  radiation into the left lateral foot, into the foot and numbness along the  lateral foot and small toes.  He has been on anti-inflammatory medications  as well as had facet injections into his back by Dr. Ethelene Hal which  unfortunately have not ameliorated his discomfort.  He also has been on  Neurontin 100 mg t.i.d. and 300 mg h.s.  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  The patient has had a rather rough year.  The patient had a fractured left femur when a tree kicked him.  This was in  March of this year. He has an IM rod in the left femur.  Subsequently the  next month he went in for arthroscopic surgery of the left knee with  meniscectomy.   PAST MEDICAL HISTORY:  The patient has gastroesophageal reflux disease.   CURRENT MEDICATIONS:  1. Neurontin 100 mg t.i.d. and 300 mg h.s.  2. Mobic 75 mg b.i.d.  3. Robaxin 500 mg q.6h p.r.n. muscle spasm.  4. Percocet one to two q.4 to 6h p.r.n. pain.  5. Nexium p.r.n.  6. Ranitidine p.r.n.   ALLERGIES:  He has no medical allergies.   SOCIAL HISTORY:  The patient is married and has no intake of tobacco  products. He has occasional beer.   FAMILY HISTORY:  Noncontributory.   REVIEW OF  SYMPTOMS:  CNS:  No seizure disorder, paralysis or double vision  but he does have numbness consistent with L4 nerve root radiculopathy on the  left.  CARDIOVASCULAR:  No chest pain, angina or orthopnea.  RESPIRATORY:  No productive cough.  No hemoptysis.  No shortness of breath.  GASTROINTESTINAL:  No nausea, vomiting, melena or bloody stools.  GENITOURINARY:  No discharge, dysuria or hematuria.  MUSCULOSKELETAL:  Primarily in history of present illness with his low back and left leg.   PHYSICAL EXAMINATION:  GENERAL:  Very cooperative, friendly 60 year old  white male.  VITAL SIGNS:  Blood pressure is 110/78, pulse 80, respirations are 12.  HEENT:  Normocephalic.  PERRLA.  He does not  wear glasses.  Oropharynx is  clear.  He has a slight torus palatinus.  The neck is supple with no  lymphadenopathy.  CHEST:  Clear to auscultation. No rhonchi.  No rales.  No wheezes.  HEART:  Regular rate and rhythm.  No murmurs are heard.  ABDOMEN:  Soft and nontender, slightly obese.  Liver and spleen are not  felt.  GENITALIA AND RECTAL: Not done, not pertinent to present illness.  EXTREMITIES:  The patient has a positive straight leg raise on the left with  radiculitis into the left buttocks area.  He also has a weakness in ankle  dorsiflexion.  Numbness as noted above along the lateral left foot into the  small toe.   ADMISSION DIAGNOSES:  1. As MRI studies have shown, the patient has a synovial cyst at L4-5     secondary to degenerative changes.  The synovial cyst is on the left     dissecting beneath the full ligamentum flavum combined with a disk bulge     causing subarticular recess stenosis and crowding of the left L5 nerve     root.   PLAN:  The patient will undergo excision of the synovial cyst and  foraminotomy at L4-5 on the left.  We will be careful to keep his  medications intact, particularly the Neurontin, while he is in the hospital.  The work note is given for him to remain out  of work for about three months.  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     Dooley L. Cherlynn June.                 Ronnell Guadalajara, M.D.    DLU/MEDQ  D:  06/20/2003  T:  06/20/2003  Job:  [id number]

## 2011-04-18 NOTE — Op Note (Signed)
NAME:  [name], [name]                         ACCOUNT NO.:  [id number]   MEDICAL RECORD NO.:  [id number]                   PATIENT TYPE:  OBV   LOCATION:  0476                                 [facility]ACILITY:  [facility]   PHYSICIAN:  Ronnell Guadalajara, M.D.                DATE OF BIRTH:  [birth year]   DATE OF PROCEDURE:  06/23/2003  DATE OF DISCHARGE:                                 OPERATIVE REPORT   PREOPERATIVE DIAGNOSES:  L5 nerve root compression due to lateral recess  stenosis and ganglion synovial cyst coming from the L4-5 facet joint.   POSTOPERATIVE DIAGNOSES:  L5 nerve root compression due to lateral recess  stenosis and ganglion synovial cyst coming from the L4-5 facet joint.   OPERATION:  Hemilaminectomy, partial facetectomy, foraminotomy, and cyst  removal microscope assisted.   SURGEON:  Ronnell Guadalajara, M.D.   ASSISTANT:  Georges Lynch. Gioffre, M.D.   DESCRIPTION OF PROCEDURE:  After suitable general anesthesia, he was  positioned on the laminectomy frame and two needles were placed to localize  the L4-5 area. An exposure of the L4 spinous process was then made on the  left and a Kocher clamp was placed and the second x-ray confirmed this was  the fourth spinous process. The 4-5 interspace and the enlarged facet joint  were then exposed revealing a synovial cyst coming outward from the facet  joint and this cyst was removed. The microscope was then brought in at this  point and a bur was used to bur down the thickened inferior facet of L4 and  then a goodly portion of it was removed with the rongeurs. We took a portion  of the lamina still leaving a good bit of ligamentum flavum intact. The bur  was used on the portion of the superior lamina of L5, a portion of that was  opened. Between the two, we exposed the superior facet of L5. It was quite  thick and with protecting with a Derrico towards the center. The bur was  used to thin down this facet. We could then get a 2 and 3 mm  rongeur under  it and remove it exposing the L5 nerve totally decompressing it extending it  and then after extending it distally and removing half of the superior facet  of L5, we went proximally and got at more of the cyst that went upward. We  left a portion of the ligamentum flavum more centrally intact, removed the  rest and the cyst protecting the dura revealing the nerve to be quite free  and loose at the end. Gelfoam soaked in thrombin was left in, the wound was  closed routinely with #1 coated Vicryl, 2-0 and running Monocryl, 3-0 on the  skin. With a compression dressing, he goes to recovery in good condition.  Ronnell Guadalajara, M.D.    PC/MEDQ  D:  06/23/2003  T:  06/23/2003  Job:  [id number]

## 2011-04-18 NOTE — Discharge Summary (Signed)
NAME:  [name], [name]                         ACCOUNT NO.:  [id number]   MEDICAL RECORD NO.:  [id number]                   PATIENT TYPE:  INP   LOCATION:  0103                                 FACILITY:  [facility]   PHYSICIAN:  Ronnell Guadalajara, M.D.                DATE OF BIRTH:  1951/02/15   DATE OF ADMISSION:  02/11/2003  DATE OF DISCHARGE:  02/18/2003                                 DISCHARGE SUMMARY   ADMISSION DIAGNOSES:  1. Mid shaft femoral fracture of the left leg.  2. Gastroesophageal reflux disease.  3. Osteoarthritis of the right knee.   DISCHARGE DIAGNOSES:  1. Mid shaft femoral fracture of the left leg.  2. Gastroesophageal reflux disease.  3. Osteoarthritis of the right knee.  4. Postoperative anemia treated with a transfusion.   OPERATION:  On February 11, 2003 the patient underwent fracture reduction with  intramedullary rodding, Dooley L. Idolina Primer, P.A. assisted.   CONSULTATIONS:  None.   BRIEF HISTORY:  This 60 year old white male was cutting some trees on his  property.  He was cutting a smaller tree which struck a larger tree and  somehow his left thigh became caught between the two.  He had immediate pain  in the left thigh area, summoned help and was brought to [facility] Emergency Room  for evaluation.  He was stable.  He had no other injuries other than some  mild abrasions to that knee.  As it would have been late in the evening for  this gentleman to have any surgery due to the backed up [facility]  schedule, it was decided to transfer him to [facility] where he  could receive appropriate and timely care to his left lower extremity.  Fortunately, neurovascular remained intact to the left lower extremity both  preoperatively and postoperatively.   HOSPITAL COURSE:  The patient tolerated the surgical procedure quite well.  He did have blood loss and postoperative hemorrhagic anemia which required  two units of banked blood.  This brought his  hemoglobin up from 8.2 to  eventually final at 9.8 with a hematocrit of 29.1.  Preoperatively, his  hemoglobin was 11.2, hematocrit was 32.8.  I might note that this type of  fracture and trauma to the thigh musculature also increased with the  bleeding.   Postoperatively, we had extreme difficulty in managing his overall pain.  We  tried various medications which caused side effects reported by the patient  to be weird.  We eventually tried several analgesics and settled on  Talwin.  This seemed to control his discomfort where he could participate  more in activities, transfers, and ambulating.  We also had difficulty in  weaning him from the PCA Dilaudid postoperatively as well.  After much  discussion and pain challenge with various medications, we were able to  control his discomfort.  Due to the side effects of the medications, we  had  to use IV antiemetics due to his nausea.  Throughout we allowed touchdown  weightbearing to the left lower extremity.  Eventually, the patient was able  to progress to a higher level of activity.  He did need to have care at home  for his safety and the fact that he only participated in physical therapy  briefly prior to discharge.  Home equipment was in place.  We eventually  were able to note that he had more confidence with ambulation.   On the day of discharge, his wounds were dry, neurovascular was intact to  left lower extremity, he had accomplished some ambulation with touchdown  weightbearing to the left lower extremity, p.o. analgesics were controlling  his discomfort.  His IV's were discontinued and he was discharged home.   LABORATORY DATA:  Laboratory values in the hospital hematologically showed a  preoperatively hemoglobin of 11.2, hematocrit was 32.8, final 9.8 and a  hematocrit of 39.1.  Urinalysis showed a urinary tract infection sensitive  to the sulfa preparation and he was given Septra DS.   No other laboratory studies done.    CONDITION ON DISCHARGE:  Improved, stable.   PLAN:  The patient is discharged to his home in the care of his family.  He  will have home health which I believe was finally decided to be from the  [facility].  He is to continue Keflex 500 mg one b.i.d. x5  days; Phenergan 25 mg, dispense #30 with a refill, one every 6 hours p.r.n.  nausea; Toradol 10 mg one t.i.d. x5 days; Talwin NX, dispense #60, one to  two every 4 hours p.r.n. pain; Robaxin 500 mg, dispense #30 with a refill,  one every 6 hours p.r.n. muscle spasm.  He will use dry dressing to the  wounds in his left lateral thigh/knee p.r.n., he may shower.  A three-in-one  commode, walker, and crutches were supplied for his home health and safety.  He is to return to see Dr. Montez Morita in about 7-10 days after the date of  surgery.  Should he have any dysuria or other problems concerning any  urinary situation, then he should followup with family physician.  We did  recommend for him to take a multivitamin tablet a day as well as one aspirin  a day.     Dooley L. Cherlynn June.                 Ronnell Guadalajara, M.D.    DLU/MEDQ  D:  02/28/2003  T:  02/28/2003  Job:  [id number]

## 2011-04-18 NOTE — Op Note (Signed)
NAME:  [name], [name]                         ACCOUNT NO.:  [id number]   MEDICAL RECORD NO.:  [id number]                   PATIENT TYPE:  INP   LOCATION:  0478                                 FACILITY:  [facility]   PHYSICIAN:  Ronnell Guadalajara, M.D.                DATE OF BIRTH:  04/08/1951   DATE OF PROCEDURE:  02/11/2003  DATE OF DISCHARGE:                                 OPERATIVE REPORT   PREOPERATIVE DIAGNOSIS:  Transverse fracture, left femur, just distal to the  middle third.   POSTOPERATIVE DIAGNOSIS:  Transverse fracture, left femur, just distal to  the middle third.   OPERATIVE PROCEDURE:  Fracture reduction with intramedullary rodding.   SURGEON:  Ronnell Guadalajara, M.D.   ASSISTANT:  Druscilla Brownie. Idolina Primer, P.A.   DESCRIPTION OF PROCEDURE:  After suitable general anesthesia, he was  positioned on the fracture table with the right leg up on a lithotomy leg  holder and the left leg in longitudinal traction.  X-rays show with the leg  in slight adduction good position on the AP view, the same significant  angulation posteriorly that we saw and displacement seen preoperatively.  He  was prepped and draped using the large hip sheet drape by extending the prep  all the way down to the knee and from just above the iliac crest.  An x-ray  was brought in marking the top of the trochanter. A guide pin was used at  that level to find the anterior and posterior areas of the trochanter and  then an incision was made from the trochanter posteriorly towards the iliac  crest.  The muscles were splint in line.  The awl was brought in and placed  at the level of the intercondylar pyriformis fossa, a hole was made with the  awl and it was checked for position and posteriorly and anteriorly looked  good.  A hole was made and a guide pin was passed and passed through nicely  to the fragment.  We reamed a bit with 9 and 10-mm proximally to try to get  some control with a handle on reduction.  We  found it was best reduced by  bringing in a crutch up underneath the distal fragment to reduce it,  applying some force from medial to lateral as well as upward and the guide  pin was passed across the fracture site.  This was a bald-tipped guide pin  in the [employer] titanium nail set.  He was subsequently reamed to 15 mm as we  planned on using a 14-mm nail as the large 15-mm nails were not available at  this time.  No reaming was felt to be needed distal to the fracture.  The  nail was then hooked to the proximal apparatus and driven across the  fracture site and checked in AP and lateral planes and looked excellent.  We  then tried to remove the bald-tipped  guidewire which was supposed to pass  through the nail easily and it would not advance.  After fiddling with it  for a while, we then pushed it distally, pulled the nail back out, put the  usual plastic piece across, pulled out the bald-tipped guidewire, put the  plain guide-tip guidewire across, and then proceeded to reposition the nail.  The traction was taken off as we positioned the nail back across the  fracture site.  The two distal screws were then put in with the targeting  device and we then dynamize the fracture compression as there is a little  gap, and with the new system, a small screw was placed down in the  intramedullary portion of the center of the nail proximally.  Once the bolt  was placed on top of its and we put the screwdriver back into that small  screw, it will dynamize on the proximal screw which was placed through the  dynamizing hole.  Unfortunately, we had trouble getting the small  screwdriver out, it required a pair of vice grip pliers to pull it out and  there was no way to get it back in with soft tissue coverage in the angle,  so although it sounded like a good way to apply compression, it was not  possible.  We then did compression then by banging on the attachment that  was applied to the nail in a  proximal direction so that we would pull the  distal fragment with affected screws up against the proximal and with  checking on the x-ray we did achieve some compression.  The proximal screw  was placed in a dynamizing position so with some weightbearing he should  continue to compress there as well.  The wounds were sterilely irrigated and  closed with some 0 coated Vicryl and some 2-0 and some staples in the skin.  He perhaps lost a pint of blood with the reaming and probably lost a pint  ahead of time but he had a hemoglobin over 14 at the start.  Rotational  check at the end revealed the same rotation as the opposite leg.  He  tolerated the surgery well and went to recovery in good condition.   ADDENDUM:  At the end of the procedure, as it had taken about three hours  longer than anticipated, in and out catheterization was done.                                               Ronnell Guadalajara, M.D.    PC/MEDQ  D:  02/11/2003  T:  02/13/2003  Job:  [id number]

## 2011-04-18 NOTE — Op Note (Signed)
   NAME:  [name], [name]                         ACCOUNT NO.:  [id number]   MEDICAL RECORD NO.:  [id number]                   PATIENT TYPE:  AMB   LOCATION:  DSC                                  FACILITY:  [facility]   PHYSICIAN:  Ronnell Guadalajara, M.D.                DATE OF BIRTH:  [birth year]   DATE OF PROCEDURE:  DATE OF DISCHARGE:                                 OPERATIVE REPORT   PREOPERATIVE DIAGNOSIS:  Torn medial meniscus, left knee.   POSTOPERATIVE DIAGNOSIS:  Torn medial meniscus, left knee.   PROCEDURE:  Partial medial meniscectomy.   SURGEON:  Ronnell Guadalajara, M.D.   ANESTHESIA:  Local plus general.   DESCRIPTION OF PROCEDURE:  After suitable local anesthesia the patient was  brought to the operating room and prepped and draped routinely. Additional  local was put in for a lateral parapatellar in flow stick and it was not  suitable for local and a general was administered.  An anterolateral portal  was made and the scope was brought in.  The patellofemoral joint looked  okay.  The medial meniscus revealed the degenerative tearing as seen on the  MRI.  This was trimmed up with a rotary meniscotome and small punch forceps.  The remaining meniscus was stable with the probe.  The lateral meniscus  looked good.  The ACL was fine.  At the end of the procedure an additional  10 mL of 1/2% Marcaine with adrenalin was put in the joint.  Ice and a  pressure dressing were applied.  The patient goes to the recovery room in  good condition.                                               Ronnell Guadalajara, M.D.    PC/MEDQ  D:  04/21/2003  T:  04/22/2003  Job:  [id number]

## 2011-04-18 NOTE — Assessment & Plan Note (Signed)
 HEALTHCARE                           GASTROENTEROLOGY OFFICE NOTE   NAME:Herbel, KODEY XUE                      MRN:          [id number]  DATE:08/17/2006                            DOB:          1951-10-09    PROBLEM:  Reflux.   REASON:  Mr. Humbarger has returned for a scheduled visit.  He suffers from  GERD manifested by cough.  This is well controlled on daily Nexium.  He has  been taking Ranitidine nightly intermittently.  He denies pyrosis,  dysphagia, or hoarseness.  He has no other GI complaints.  He underwent  screening colonoscopy in March 2006 which was entirely normal.   PHYSICAL EXAMINATION:  Pulse 82, blood pressure 124/84.  Weight 229.  HEENT: EOMI. PERRLA. Sclerae are anicteric.  Conjunctivae are pink.  NECK:  Supple without thyromegaly, adenopathy or carotid bruits.  CHEST:  Clear to auscultation and percussion without adventitious sounds.  CARDIAC:  Regular rhythm; normal S1 S2.  There are no murmurs, gallops or  rubs.  ABDOMEN:  Bowel sounds are normoactive.  Abdomen is soft, non-tender and non-  distended.  There are no abdominal masses, tenderness, splenic enlargement  or hepatomegaly.  EXTREMITIES:  Full range of motion.  No cyanosis, clubbing or edema.  RECTAL:  Deferred.   IMPRESSION:  Gastroesophageal reflux disease manifested by cough - well  controlled with Nexium.  I am not convinced that he requires the Ranitidine.   RECOMMENDATION:  Continue Nexium.                                   Barbette Hair. Arlyce Dice, MD,FACG   RDK/MedQ  DD:  08/17/2006  DT:  08/18/2006  Job #:  [id number]

## 2011-09-23 ENCOUNTER — Other Ambulatory Visit (INDEPENDENT_AMBULATORY_CARE_PROVIDER_SITE_OTHER): Payer: Managed Care, Other (non HMO)

## 2011-09-23 ENCOUNTER — Ambulatory Visit (INDEPENDENT_AMBULATORY_CARE_PROVIDER_SITE_OTHER): Payer: Managed Care, Other (non HMO) | Admitting: Internal Medicine

## 2011-09-23 ENCOUNTER — Other Ambulatory Visit: Payer: Self-pay | Admitting: Internal Medicine

## 2011-09-23 ENCOUNTER — Encounter: Payer: Self-pay | Admitting: Internal Medicine

## 2011-09-23 VITALS — BP 122/82 | HR 75 | Temp 98.1°F | Ht 72.0 in | Wt 230.0 lb

## 2011-09-23 DIAGNOSIS — J019 Acute sinusitis, unspecified: Secondary | ICD-10-CM

## 2011-09-23 DIAGNOSIS — R7309 Other abnormal glucose: Secondary | ICD-10-CM

## 2011-09-23 DIAGNOSIS — R7302 Impaired glucose tolerance (oral): Secondary | ICD-10-CM

## 2011-09-23 DIAGNOSIS — Z0000 Encounter for general adult medical examination without abnormal findings: Secondary | ICD-10-CM | POA: Insufficient documentation

## 2011-09-23 HISTORY — DX: Impaired glucose tolerance (oral): R73.02

## 2011-09-23 LAB — HEMOGLOBIN A1C: Hgb A1c MFr Bld: 5.8 % (ref 4.6–6.5)

## 2011-09-23 LAB — CBC WITH DIFFERENTIAL/PLATELET
Basophils Relative: 0.3 % (ref 0.0–3.0)
Eosinophils Absolute: 0.3 10*3/uL (ref 0.0–0.7)
Eosinophils Relative: 2.9 % (ref 0.0–5.0)
Hemoglobin: 15.2 g/dL (ref 13.0–17.0)
Lymphocytes Relative: 22.3 % (ref 12.0–46.0)
MCHC: 33.8 g/dL (ref 30.0–36.0)
Monocytes Relative: 11.3 % (ref 3.0–12.0)
Neutro Abs: 6.5 10*3/uL (ref 1.4–7.7)
Neutrophils Relative %: 63.2 % (ref 43.0–77.0)
RBC: 4.86 Mil/uL (ref 4.22–5.81)
WBC: 10.2 10*3/uL (ref 4.5–10.5)

## 2011-09-23 LAB — URINALYSIS, ROUTINE W REFLEX MICROSCOPIC
Hgb urine dipstick: NEGATIVE
Nitrite: NEGATIVE
Specific Gravity, Urine: 1.015 (ref 1.000–1.030)
Total Protein, Urine: NEGATIVE
Urine Glucose: NEGATIVE
pH: 6.5 (ref 5.0–8.0)

## 2011-09-23 LAB — BASIC METABOLIC PANEL
CO2: 30 mEq/L (ref 19–32)
Calcium: 9.5 mg/dL (ref 8.4–10.5)
Creatinine, Ser: 1 mg/dL (ref 0.4–1.5)
GFR: 77.77 mL/min (ref >60.00)
Sodium: 142 mEq/L (ref 135–145)

## 2011-09-23 LAB — LIPID PANEL
HDL: 54.2 mg/dL (ref >39.00)
VLDL: 32 mg/dL (ref 0.0–40.0)

## 2011-09-23 LAB — PSA: PSA: 0.77 ng/mL (ref 0.10–4.00)

## 2011-09-23 LAB — HEPATIC FUNCTION PANEL
Bilirubin, Direct: 0.1 mg/dL (ref 0.0–0.3)
Total Protein: 7 g/dL (ref 6.0–8.3)

## 2011-09-23 MED ORDER — LEVOFLOXACIN 500 MG PO TABS: 500.0000 mg | ORAL_TABLET | Freq: Every day | ORAL | Status: DC

## 2011-09-23 MED ORDER — LEVOFLOXACIN 500 MG PO TABS: 500.0000 mg | ORAL_TABLET | Freq: Every day | ORAL | Status: AC

## 2011-09-23 NOTE — Progress Notes (Signed)
Addended by: Scharlene Gloss B on: 09/23/2011 04:39 PM   Modules accepted: Orders

## 2011-09-23 NOTE — Progress Notes (Signed)
Subjective:    Patient ID: [name], male    DOB: 08-31-1951, 60 y.o.   MRN: [id number]  HPI Here for wellness and f/u;  Overall doing ok;  Pt denies CP, worsening SOB, DOE, wheezing, orthopnea, PND, worsening LE edema, palpitations, dizziness or syncope.  Pt denies neurological change such as new Headache, facial or extremity weakness.  Pt denies polydipsia, polyuria, or low sugar symptoms. Pt states overall good compliance with treatment and medications, good tolerability, and trying to follow lower cholesterol diet.  Pt denies worsening depressive symptoms, suicidal ideation or panic. No fever, wt loss, night sweats, loss of appetite, or other constitutional symptoms.  Pt states good ability with ADL's, low fall risk, home safety reviewed and adequate, no significant changes in hearing or vision, and occasionally active with exercise. Also  -  Here with 3 days acute onset fever, facial pain, pressure, general weakness and malaise, and greenish d/c, with marked ST and non prod cough.  Mentions also due to ESI to right lumbar next wk, and scheduled for right knee cortisone approx 1 wk after that.  Had BS 113 with labs sept 2011. No past medical history on file. No past surgical history on file.  reports that he has quit smoking. He does not have any smokeless tobacco history on file. He reports that he drinks alcohol. His drug history not on file. family history is not on file. No Known Allergies Current Outpatient Prescriptions on File Prior to Visit  Medication Sig Dispense Refill  . fluticasone (FLONASE) 50 MCG/ACT nasal spray USE 2 SPRAYS IN EACH NOSTRIL DAILY  3 g  3   Review of Systems Review of Systems  Constitutional: Negative for diaphoresis, activity change, appetite change and unexpected weight change.  HENT: Negative for hearing loss, ear pain, facial swelling, mouth sores and neck stiffness.   Eyes: Negative for pain, redness and visual disturbance.  Respiratory: Negative for  shortness of breath and wheezing.   Cardiovascular: Negative for chest pain and palpitations.  Gastrointestinal: Negative for diarrhea, blood in stool, abdominal distention and rectal pain.  Genitourinary: Negative for hematuria, flank pain and decreased urine volume.  Musculoskeletal: Negative for myalgias and joint swelling.  Skin: Negative for color change and wound.  Neurological: Negative for syncope and numbness.  Hematological: Negative for adenopathy.  Psychiatric/Behavioral: Negative for hallucinations, self-injury, decreased concentration and agitation.      Objective:   Physical Exam BP 122/82  Pulse 75  Temp(Src) 98.1 F (36.7 C) (Oral)  Ht 6' (1.829 m)  Wt 230 lb (104.327 kg)  BMI 31.19 kg/m2  SpO2 95% Physical Exam  VS noted, mild ill Constitutional: Pt is oriented to person, place, and time. Appears well-developed and well-nourished.  Head: Normocephalic and atraumatic.  Right Ear: External ear normal.  Left Ear: External ear normal.  Nose: Nose normal.  Mouth/Throat: Oropharynx is clear and moist.  Bilat tm's mild erythema.  Sinus tender bilat.  Pharynx mild erythema Eyes: Conjunctivae and EOM are normal. Pupils are equal, round, and reactive to light.  Neck: Normal range of motion. Neck supple. No JVD present. No tracheal deviation present.  Cardiovascular: Normal rate, regular rhythm, normal heart sounds and intact distal pulses.   Pulmonary/Chest: Effort normal and breath sounds normal.  Abdominal: Soft. Bowel sounds are normal. There is no tenderness.  Musculoskeletal: Normal range of motion. Exhibits no edema.  Lymphadenopathy:  Has no cervical adenopathy.  Neurological: Pt is alert and oriented to person, place, and time. Pt has  normal reflexes. No cranial nerve deficit.  Skin: Skin is warm and dry. No rash noted.  Psychiatric:  Has  normal mood and affect. Behavior is normal.  Spine nontender,, right knee with marked medial bony osteophyte, loss of medial  cartilage    Assessment & Plan:

## 2011-09-23 NOTE — Patient Instructions (Addendum)
Take all new medications as prescribed Continue all other medications as before You can also take Delsym OTC for cough, and/or Mucinex (or it's generic off brand) for congestion Please go to LAB in the Basement for the blood and/or urine tests to be done today Please call the phone number [phone] (the PhoneTree System) for results of testing in 2-3 days;  When calling, simply dial the number, and when prompted enter the MRN number above (the Medical Record Number) and the # key, then the message should start. Please keep your appointments with your specialists as you have planned - the steroid injections Please return in 1 year for your yearly visit, or sooner if needed, with Lab testing done 3-5 days before

## 2011-09-23 NOTE — Assessment & Plan Note (Signed)
asympt   - for a1c today  

## 2011-09-23 NOTE — Assessment & Plan Note (Signed)
Overall doing well, age appropriate education and counseling updated, referrals for preventative services and immunizations addressed, dietary and smoking counseling addressed, most recent labs and ECG reviewed.  I have personally reviewed and have noted: 1) the patient's medical and social history 2) The pt's use of alcohol, tobacco, and illicit drugs 3) The patient's current medications and supplements 4) Functional ability including ADL's, fall risk, home safety risk, hearing and visual impairment 5) Diet and physical activities 6) Evidence for depression or mood disorder 7) The patient's height, weight, and BMI have been recorded in the chart I have made referrals, and provided counseling and education based on review of the above Declines immunizations, or colonscopy

## 2011-09-23 NOTE — Assessment & Plan Note (Signed)
Mild to mod, for antibx course,  to f/u any worsening symptoms or concerns 

## 2011-09-24 ENCOUNTER — Telehealth: Payer: Self-pay

## 2011-09-24 NOTE — Telephone Encounter (Signed)
No, ok to take - the concern is only theoretical that taking the antibiotic will increase the blood level of the nexium, but this will not cause any problem, so ok to take both at same time

## 2011-09-24 NOTE — Telephone Encounter (Signed)
Patient left message stating he is on nexium and cannot take the antiobiotic prescribed yesterday if taking nexium.

## 2011-09-24 NOTE — Telephone Encounter (Signed)
Patient informed. 

## 2011-12-19 ENCOUNTER — Encounter: Payer: Self-pay | Admitting: Internal Medicine

## 2011-12-19 ENCOUNTER — Ambulatory Visit (INDEPENDENT_AMBULATORY_CARE_PROVIDER_SITE_OTHER): Payer: Managed Care, Other (non HMO) | Admitting: Internal Medicine

## 2011-12-19 VITALS — BP 120/82 | HR 79 | Temp 98.6°F | Ht 73.0 in | Wt 234.5 lb

## 2011-12-19 DIAGNOSIS — R7302 Impaired glucose tolerance (oral): Secondary | ICD-10-CM

## 2011-12-19 DIAGNOSIS — J45909 Unspecified asthma, uncomplicated: Secondary | ICD-10-CM

## 2011-12-19 DIAGNOSIS — R7309 Other abnormal glucose: Secondary | ICD-10-CM

## 2011-12-19 DIAGNOSIS — J019 Acute sinusitis, unspecified: Secondary | ICD-10-CM | POA: Insufficient documentation

## 2011-12-19 MED ORDER — LEVAQUIN 250 MG PO TABS: 250.0000 mg | ORAL_TABLET | Freq: Every day | ORAL | Status: AC

## 2011-12-19 MED ORDER — HYDROCODONE-HOMATROPINE 5-1.5 MG/5ML PO SYRP: 5.0000 mL | ORAL_SOLUTION | Freq: Four times a day (QID) | ORAL | Status: AC | PRN

## 2011-12-19 NOTE — Assessment & Plan Note (Signed)
Mild to mod, for antibx course,  to f/u any worsening symptoms or concerns 

## 2011-12-19 NOTE — Assessment & Plan Note (Signed)
stable overall by hx and exam, most recent data reviewed with pt, and pt to continue medical treatment as before  SpO2 Readings from Last 3 Encounters:  12/19/11 96%  09/23/11 95%  10/21/10 97%

## 2011-12-19 NOTE — Progress Notes (Signed)
  Subjective:    Patient ID: [name], male    DOB: Jan 18, 1951, 61 y.o.   MRN: [id number]  HPI   Here with 3 days acute onset fever, facial pain, pressure, general weakness and malaise, and greenish d/c, with slight ST, but little to no cough and Pt denies chest pain, increased sob or doe, wheezing, orthopnea, PND, increased LE swelling, palpitations, dizziness or syncope.  No night time awakening with wheezing.  Pt denies new neurological symptoms such as new headache, or facial or extremity weakness or numbness   Pt denies polydipsia, polyuria, Pt states overall good compliance with meds, trying to follow lower cholesterol diet, wt overall stable but little exercise however.    Pt denies fever, wt loss, night sweats, loss of appetite, or other constitutional symptoms except for the above.  Has some recurring right sciatica pain to the right buttock no change overall.  Has chronic mod to severe right knee pain, likely due for right knee TKA later this yr per Dr [name] Past Medical History  Diagnosis Date  . ALLERGIC RHINITIS 12/31/2007  . DEGENERATIVE JOINT DISEASE 07/18/2007  . Extrinsic asthma, unspecified 09/08/2009  . GERD 07/18/2007  . HYPERLIPIDEMIA 08/14/2010  . Impaired glucose tolerance 09/23/2011  . COUGH, CHRONIC 07/18/2007  . OBESITY 07/18/2007   No past surgical history on file.  reports that he has quit smoking. He does not have any smokeless tobacco history on file. He reports that he drinks alcohol. His drug history not on file. family history is not on file. No Known Allergies Current Outpatient Prescriptions on File Prior to Visit  Medication Sig Dispense Refill  . fluticasone (FLONASE) 50 MCG/ACT nasal spray USE 2 SPRAYS IN EACH NOSTRIL DAILY  3 g  3  . methocarbamol (ROBAXIN) 500 MG tablet Take 500 mg by mouth as needed.        . naproxen (NAPROSYN) 500 MG tablet Take 500 mg by mouth 2 (two) times daily as needed.         Review of Systems Review of Systems    Constitutional: Negative for diaphoresis and unexpected weight change.  HENT: Negative for drooling and tinnitus.   Eyes: Negative for photophobia and visual disturbance.  Respiratory: Negative for choking and stridor.   Gastrointestinal: Negative for vomiting and blood in stool.  Genitourinary: Negative for hematuria and decreased urine volume.     Objective:   Physical Exam BP 120/82  Pulse 79  Temp(Src) 98.6 F (37 C) (Oral)  Ht 6\' 1"  (1.854 m)  Wt 234 lb 8 oz (106.369 kg)  BMI 30.94 kg/m2  SpO2 96% Physical Exam  VS noted, mild ill Constitutional: Pt appears well-developed and well-nourished.  HENT: Head: Normocephalic.  Right Ear: External ear normal.  Left Ear: External ear normal.  Bilat tm's mild erythema.  Sinus tender bilat.  Pharynx mild erythema Eyes: Conjunctivae and EOM are normal. Pupils are equal, round, and reactive to light.  Neck: Normal range of motion. Neck supple.  Cardiovascular: Normal rate and regular rhythm.   Pulmonary/Chest: Effort normal and breath sounds normal.  Neurological: Pt is alert. No cranial nerve deficit.  Skin: Skin is warm. No erythema.  Psychiatric: Pt behavior is normal. Thought content normal. 1+ nervous    Assessment & Plan:

## 2011-12-19 NOTE — Assessment & Plan Note (Signed)
stable overall by hx and exam, most recent data reviewed with pt, and pt to continue medical treatment as before  Lab Results  Component Value Date   WBC 10.2 09/23/2011   HGB 15.2 09/23/2011   HCT 44.8 09/23/2011   PLT 230.0 09/23/2011   GLUCOSE 72 09/23/2011   CHOL 212* 09/23/2011   TRIG 160.0* 09/23/2011   HDL 54.20 09/23/2011   LDLDIRECT 132.9 09/23/2011   LDLCALC 128* 09/28/2009   ALT 49 09/23/2011   AST 36 09/23/2011   NA 142 09/23/2011   K 4.6 09/23/2011   CL 105 09/23/2011   CREATININE 1.0 09/23/2011   BUN 15 09/23/2011   CO2 30 09/23/2011   TSH 1.22 09/23/2011   PSA 0.77 09/23/2011   HGBA1C 5.8 09/23/2011

## 2011-12-19 NOTE — Patient Instructions (Addendum)
Take all new medications as prescribed Continue all other medications as before  

## 2012-01-14 ENCOUNTER — Encounter: Payer: Self-pay | Admitting: Gastroenterology

## 2012-03-03 ENCOUNTER — Telehealth: Payer: Self-pay

## 2012-03-03 MED ORDER — ESOMEPRAZOLE MAGNESIUM 40 MG PO CPDR: 40.0000 mg | DELAYED_RELEASE_CAPSULE | Freq: Every day | ORAL | Status: DC

## 2012-03-03 NOTE — Telephone Encounter (Signed)
Patient is requesting Nexium to be sent in to [facility], medication currently not on patients medication list please advise

## 2012-03-03 NOTE — Telephone Encounter (Signed)
Done per emr 

## 2012-05-10 ENCOUNTER — Ambulatory Visit (INDEPENDENT_AMBULATORY_CARE_PROVIDER_SITE_OTHER): Payer: Managed Care, Other (non HMO) | Admitting: Internal Medicine

## 2012-05-10 ENCOUNTER — Other Ambulatory Visit: Payer: Self-pay

## 2012-05-10 ENCOUNTER — Encounter: Payer: Self-pay | Admitting: Internal Medicine

## 2012-05-10 VITALS — BP 122/82 | HR 94 | Temp 98.2°F | Resp 16 | Wt 222.2 lb

## 2012-05-10 DIAGNOSIS — L03114 Cellulitis of left upper limb: Secondary | ICD-10-CM | POA: Insufficient documentation

## 2012-05-10 DIAGNOSIS — IMO0002 Reserved for concepts with insufficient information to code with codable children: Secondary | ICD-10-CM

## 2012-05-10 DIAGNOSIS — R7309 Other abnormal glucose: Secondary | ICD-10-CM

## 2012-05-10 DIAGNOSIS — J309 Allergic rhinitis, unspecified: Secondary | ICD-10-CM

## 2012-05-10 DIAGNOSIS — J45909 Unspecified asthma, uncomplicated: Secondary | ICD-10-CM

## 2012-05-10 DIAGNOSIS — R7302 Impaired glucose tolerance (oral): Secondary | ICD-10-CM

## 2012-05-10 MED ORDER — DOXYCYCLINE HYCLATE 100 MG PO TABS: 100.0000 mg | ORAL_TABLET | Freq: Two times a day (BID) | ORAL | Status: DC

## 2012-05-10 NOTE — Assessment & Plan Note (Signed)
stable overall by hx and exam, and pt to continue medical treatment as before 

## 2012-05-10 NOTE — Assessment & Plan Note (Signed)
stable overall by hx and exam,  and pt to continue medical treatment as before, no new med needed at this time

## 2012-05-10 NOTE — Assessment & Plan Note (Signed)
Mild to mod, for antibx course,  to f/u any worsening symptoms or concerns, to Er for any worsening pain, red, swelling, drainage, fever or chills

## 2012-05-10 NOTE — Progress Notes (Signed)
  Subjective:    Patient ID: [name], male    DOB: [birth year], 61 y.o.   MRN: [id number]  HPI  Here with acute onset 2-3 days left arm red, swelling, tender starting at the mid post arm, now extending to the left elbow over 2-3 days, but no fever, chills, n/v or other feeling ill.  No trauma or prior hx of same, no hx of MRSA.  Pt denies chest pain, increased sob or doe, wheezing, orthopnea, PND, increased LE swelling, palpitations, dizziness or syncope.  Pt denies new neurological symptoms such as new headache, or facial or extremity weakness or numbness   Pt denies polydipsia, polyuria.  Recent allergy symptoms improved with re-start flonase, after several wks ongoing nasal allergy symptoms with clear congestion, itch and sneeze, without fever, pain, ST, cough or wheezing.  Past Medical History  Diagnosis Date  . ALLERGIC RHINITIS 12/31/2007  . DEGENERATIVE JOINT DISEASE 07/18/2007  . Extrinsic asthma, unspecified 09/08/2009  . GERD 07/18/2007  . HYPERLIPIDEMIA 08/14/2010  . Impaired glucose tolerance 09/23/2011  . COUGH, CHRONIC 07/18/2007  . OBESITY 07/18/2007   No past surgical history on file.  reports that he has quit smoking. He does not have any smokeless tobacco history on file. He reports that he drinks alcohol. His drug history not on file. family history is not on file. No Known Allergies Current Outpatient Prescriptions on File Prior to Visit  Medication Sig Dispense Refill  . esomeprazole (NEXIUM) 40 MG capsule Take 1 capsule (40 mg total) by mouth daily.  90 capsule  3  . gabapentin (NEURONTIN) 300 MG capsule Take 300 mg by mouth 2 (two) times daily.      . methocarbamol (ROBAXIN) 500 MG tablet Take 500 mg by mouth as needed.        . naproxen (NAPROSYN) 500 MG tablet Take 500 mg by mouth 2 (two) times daily as needed.        Marland Kitchen DISCONTD: fluticasone (FLONASE) 50 MCG/ACT nasal spray USE 2 SPRAYS IN EACH NOSTRIL DAILY  3 g  3   Review of Systems Review of Systems    Constitutional: Negative for diaphoresis and unexpected weight change.  Eyes: Negative for photophobia and visual disturbance.  Respiratory: Negative for choking and stridor.   Gastrointestinal: Negative for vomiting and blood in stool.  Genitourinary: Negative for hematuria and decreased urine volume.  Musculoskeletal: Negative for gait problem.  Neurological: Negative for tremors and numbness.     Objective:   Physical Exam BP 122/82  Pulse 94  Temp(Src) 98.2 F (36.8 C) (Oral)  Resp 16  Wt 222 lb 4 oz (100.812 kg)  SpO2 95% Physical Exam  VS noted Constitutional: Pt appears well-developed and well-nourished.  HENT: Head: Normocephalic.  Right Ear: External ear normal.  Left Ear: External ear normal.  Eyes: Conjunctivae and EOM are normal. Pupils are equal, round, and reactive to light.  Neck: Normal range of motion. Neck supple.  Cardiovascular: Normal rate and regular rhythm.   Pulmonary/Chest: Effort normal and breath sounds normal.  Neurological: Pt is alert. Not confused Skin: left post mid arm with mild to mod approx 6 cm area red, tender, swelling/mild induration extending to the left elbow but not post upper arm, no red streaks, no drainage or fluctuance Psychiatric: Pt behavior is normal. Thought content normal. not nervous or depressed appearing    Assessment & Plan:

## 2012-05-10 NOTE — Assessment & Plan Note (Signed)
stable overall by hx and exam, most recent data reviewed with pt, and pt to continue medical treatment as before Lab Results  Component Value Date   HGBA1C 5.8 09/23/2011

## 2012-05-10 NOTE — Patient Instructions (Addendum)
Take all new medications as prescribed Continue all other medications as before  

## 2012-05-11 ENCOUNTER — Telehealth: Payer: Self-pay

## 2012-05-11 NOTE — Telephone Encounter (Signed)
If only slight, ok to watch further for any more worsening such as more redness, swelling, tender, drainage or d/c, or red streaks going up the arm, or higher fever, chills

## 2012-05-11 NOTE — Telephone Encounter (Signed)
Pt called stating that he started ABX yesterday evening but has noticed very slight increase of redness at infected area. Pt says he was strongly advised to go to ER by MD at visit 06/10 if infection worsened in any way. Please advise, should pt continue with treatment or go to ER?

## 2012-05-11 NOTE — Telephone Encounter (Signed)
Patient informed of MD's instructions

## 2012-05-12 ENCOUNTER — Encounter (HOSPITAL_COMMUNITY): Payer: Self-pay | Admitting: Emergency Medicine

## 2012-05-12 ENCOUNTER — Observation Stay (HOSPITAL_COMMUNITY)
Admission: EM | Admit: 2012-05-12 | Discharge: 2012-05-12 | Disposition: A | Discharge status: Home / Self Care | Payer: Managed Care, Other (non HMO) | Source: Ambulatory Visit | Attending: Emergency Medicine | Admitting: Emergency Medicine

## 2012-05-12 DIAGNOSIS — L0390 Cellulitis, unspecified: Secondary | ICD-10-CM

## 2012-05-12 DIAGNOSIS — L539 Erythematous condition, unspecified: Principal | ICD-10-CM | POA: Insufficient documentation

## 2012-05-12 DIAGNOSIS — R609 Edema, unspecified: Secondary | ICD-10-CM | POA: Insufficient documentation

## 2012-05-12 DIAGNOSIS — Z792 Long term (current) use of antibiotics: Secondary | ICD-10-CM | POA: Insufficient documentation

## 2012-05-12 DIAGNOSIS — M719 Bursopathy, unspecified: Secondary | ICD-10-CM

## 2012-05-12 LAB — CBC
HCT: 41.7 % (ref 39.0–52.0)
Hemoglobin: 13.9 g/dL (ref 13.0–17.0)
MCH: 30 pg (ref 26.0–34.0)
MCHC: 33.3 g/dL (ref 30.0–36.0)
RDW: 13.2 % (ref 11.5–15.5)

## 2012-05-12 LAB — DIFFERENTIAL
Basophils Absolute: 0 10*3/uL (ref 0.0–0.1)
Basophils Relative: 1 % (ref 0–1)
Eosinophils Absolute: 0.1 10*3/uL (ref 0.0–0.7)
Monocytes Absolute: 0.9 10*3/uL (ref 0.1–1.0)
Monocytes Relative: 12 % (ref 3–12)
Neutro Abs: 4.7 10*3/uL (ref 1.7–7.7)
Neutrophils Relative %: 62 % (ref 43–77)

## 2012-05-12 LAB — BASIC METABOLIC PANEL
BUN: 18 mg/dL (ref 6–23)
Creatinine, Ser: 0.9 mg/dL (ref 0.50–1.35)
GFR calc Af Amer: >90 mL/min (ref >90)
GFR calc non Af Amer: >90 mL/min (ref >90)

## 2012-05-12 LAB — SYNOVIAL CELL COUNT + DIFF, W/ CRYSTALS
Crystals, Fluid: NONE SEEN
Lymphocytes-Synovial Fld: 4 % (ref 0–20)
Monocyte-Macrophage-Synovial Fluid: 1 % — ABNORMAL LOW (ref 50–90)
Neutrophil, Synovial: 95 % — ABNORMAL HIGH (ref 0–25)

## 2012-05-12 LAB — GRAM STAIN

## 2012-05-12 MED ORDER — VANCOMYCIN HCL IN DEXTROSE 1-5 GM/200ML-% IV SOLN
1000.0000 mg | Freq: Once | INTRAVENOUS | Status: AC
  Administered 2012-05-12: 1000 mg via INTRAVENOUS
  Filled 2012-05-12: qty 200

## 2012-05-12 MED ORDER — SODIUM CHLORIDE 0.9 % IV SOLN
20.0000 mL | INTRAVENOUS | Status: DC
  Administered 2012-05-12: 20 mL/h via INTRAVENOUS

## 2012-05-12 NOTE — Progress Notes (Signed)
Orthopedic Tech Progress Note Patient Details:  [name] [birth year] [id number]  Ortho Devices Type of Ortho Device: Arm foam sling Ortho Device/Splint Interventions: Application   Cammer, Mickie Bail 05/12/2012, 3:53 PM

## 2012-05-12 NOTE — Discharge Instructions (Signed)
Read the information below.  Please follow the care instructions below and rest your arm as much as possible.   If the redness gets worse and spreads up your arm or your develop fevers, return to the ER immediately for a recheck.  You should be seen on Friday again for a recheck even if it appears to be getting better.  Continue to take ibuprofen and tylenol (or vicodin - do not take tylenol and vicodin at the same time to avoid overdose) as needed for pain.  You may return to the ER at any time for worsening condition or any new symptoms that concern you.   Bursitis Bursitis is when the fluid-filled sac (bursa) that covers and protects a joint gets puffy and irritated. The elbow, shoulder, hip, and knee joints are most often affected. HOME CARE  Put ice on the area.   Put ice in a plastic bag.   Place a towel between your skin and the bag.   Leave the ice on for 15 to 20 minutes, 3 to 4 times a day.   Put the joint through a full range of motion 4 times a day. Rest the injured joint at other times. When you have less pain, begin slow movements and usual activities.   Only take medicine as told by your doctor.   Follow up with your doctor. Any delay in care could stop the bursitis from healing. This could cause long-term pain.  GET HELP RIGHT AWAY IF:   You have more pain with treatment.   You have a temperature by mouth above 102 F (38.9 C), not controlled by medicine.   You have heat and irritation over the fluid-filled sac.  MAKE SURE YOU:   Understand these instructions.   Will watch your condition.   Will get help right away if you are not doing well or get worse.  Document Released: 05/07/2010 Document Revised: 11/06/2011 Document Reviewed: 05/07/2010 [facility] Patient Information 2012 [place], [place].  Cellulitis Cellulitis is an infection of the skin and the tissue beneath it. The area is typically red and tender. It is caused by germs (bacteria) (usually staph or  strep) that enter the body through cuts or sores. Cellulitis most commonly occurs in the arms or lower legs.  HOME CARE INSTRUCTIONS   If you are given a prescription for medications which kill germs (antibiotics), take as directed until finished.   If the infection is on the arm or leg, keep the limb elevated as able.   Use a warm cloth several times per day to relieve pain and encourage healing.   See your caregiver for recheck of the infected site as directed if problems arise.   Only take over-the-counter or prescription medicines for pain, discomfort, or fever as directed by your caregiver.  SEEK MEDICAL CARE IF:   The area of redness (inflammation) is spreading, there are red streaks coming from the infected site, or if a part of the infection begins to turn dark in color.   The joint or bone underneath the infected skin becomes painful after the skin has healed.   The infection returns in the same or another area after it seems to have gone away.   A boil or bump swells up. This may be an abscess.   New, unexplained problems such as pain or fever develop.  SEEK IMMEDIATE MEDICAL CARE IF:   You have a fever.   You or your child feels drowsy or lethargic.   There is vomiting,  diarrhea, or lasting discomfort or feeling ill (malaise) with muscle aches and pains.  MAKE SURE YOU:   Understand these instructions.   Will watch your condition.   Will get help right away if you are not doing well or get worse.  Document Released: 08/27/2005 Document Revised: 11/06/2011 Document Reviewed: 07/05/2008 [facility] Patient Information 2012 [place], [place].

## 2012-05-12 NOTE — ED Provider Notes (Signed)
11:47 AM  Pt is in CDU holding for continued monitoring of left arm.  Signout received from Dr Anitra Lauth.  Pt with erythema, edema over left forearm.  Also with normal appearing synovial fluid from bursa, aspirated by Dr Anitra Lauth.  States he has inflammatory bursitis and cellulitis.  Pt reports it has not gotten worse since he has been here.  Declines pain medication.  1:40 PM Per Dr Plunkett's request, I have spoken with Dr Melvyn Novas.  Dr Melvyn Novas suggests continued treatment on doxycycline, long arm posterior splint, f/u in office on Monday.   2:15 PM Patient's erythema is unchanged.  I have discussed the plan with the patient who states he is leaving town this weekend for the beach and will not be able to see Dr Melvyn Novas in the office on Monday.    I spoke again with Dr Melvyn Novas who cannot see patient earlier this week before patient leaves for the beach.  I spoke again with Dr Anitra Lauth who recommends splint and f/u in ED.  Patient declines splint, as a compromise I have ordered a sling, which he agrees to.  Have discussed return precautions, care instructions with patient.  Patient verbalizes understanding and agrees with plan.    4:33 PM Patient states he has seen Dr Otelia Sergeant of [facility] orthopedics below - he will call there to see if he can follow up with them on Friday, if not he will return to ED.    Results for orders placed during the hospital encounter of 05/12/12  CELL COUNT + DIFF,  W/ CRYST-SYNVL FLD      Component Value Range   Color, Synovial RED (*) YELLOW   Appearance-Synovial CLOUDY (*) CLEAR   Crystals, Fluid NO CRYSTALS SEEN     WBC, Synovial 2181 (*) 0 - 200 /cu mm   Neutrophil, Synovial 95 (*) 0 - 25 %   Lymphocytes-Synovial Fld 4  0 - 20 %   Monocyte-Macrophage-Synovial Fluid 1 (*) 50 - 90 %   Eosinophils-Synovial 0  0 - 1 %   Other Cells-SYN       Value: RARE PYKNOTIC NEUTROPHILS, FEW VACUOLATED NEUTROPHILS.  GRAM STAIN      Component Value Range   Specimen Description FLUID  SYNOVIAL LEFT     Special Requests ELBOW 6.6CC     Gram Stain       Value: FEW WBC PRESENT,BOTH PMN AND MONONUCLEAR     NO ORGANISMS SEEN   Report Status 05/12/2012 FINAL    CBC      Component Value Range   WBC 7.6  4.0 - 10.5 K/uL   RBC 4.64  4.22 - 5.81 MIL/uL   Hemoglobin 13.9  13.0 - 17.0 g/dL   HCT 04.5  40.9 - 81.1 %   MCV 89.9  78.0 - 100.0 fL   MCH 30.0  26.0 - 34.0 pg   MCHC 33.3  30.0 - 36.0 g/dL   RDW 91.4  78.2 - 95.6 %   Platelets 217  150 - 400 K/uL  DIFFERENTIAL      Component Value Range   Neutrophils Relative 62  43 - 77 %   Neutro Abs 4.7  1.7 - 7.7 K/uL   Lymphocytes Relative 23  12 - 46 %   Lymphs Abs 1.8  0.7 - 4.0 K/uL   Monocytes Relative 12  3 - 12 %   Monocytes Absolute 0.9  0.1 - 1.0 K/uL   Eosinophils Relative 2  0 - 5 %   Eosinophils  Absolute 0.1  0.0 - 0.7 K/uL   Basophils Relative 1  0 - 1 %   Basophils Absolute 0.0  0.0 - 0.1 K/uL  BASIC METABOLIC PANEL      Component Value Range   Sodium 138  135 - 145 mEq/L   Potassium 3.9  3.5 - 5.1 mEq/L   Chloride 106  96 - 112 mEq/L   CO2 21  19 - 32 mEq/L   Glucose, Bld 168 (*) 70 - 99 mg/dL   BUN 18  6 - 23 mg/dL   Creatinine, Ser 6.21  0.50 - 1.35 mg/dL   Calcium 9.1  8.4 - 30.8 mg/dL   GFR calc non Af Amer >90  >90 mL/min   GFR calc Af Amer >90  >90 mL/min   No results found.    Rise Patience, PA 05/12/12 1611  Rise Patience, [place] 05/12/12 [phone]

## 2012-05-12 NOTE — ED Provider Notes (Signed)
History     CSN: [id number]  Arrival date & time 05/12/12  4098   First MD Initiated Contact with Patient 05/12/12 0746      Chief Complaint  Patient presents with  . Wound Infection    (Consider location/radiation/quality/duration/timing/severity/associated sxs/prior treatment) HPI Comments: Patient gradually started to develop left upper forearm pain and swelling for 5 days ago. The swelling, redness and pain has gradually worsened now to involve his entire left forearm and a portion of his elbow. He is able to bend the elbow but it's painful. 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 He was seen at his doctor's office on Monday and was placed on doxycycline but told to come to the ER if the symptoms worsen. HEENT is a 5/10 and does not radiate it's made worse by bending the arm or touching the arm. He denies fever, injury or insect/spider bite  The history is provided by the patient.    Past Medical History  Diagnosis Date  . ALLERGIC RHINITIS 12/31/2007  . DEGENERATIVE JOINT DISEASE 07/18/2007  . Extrinsic asthma, unspecified 09/08/2009  . GERD 07/18/2007  . HYPERLIPIDEMIA 08/14/2010  . Impaired glucose tolerance 09/23/2011  . COUGH, CHRONIC 07/18/2007  . OBESITY 07/18/2007    History reviewed. No pertinent past surgical history.  No family history on file.  History  Substance Use Topics  . Smoking status: Former Games developer  . Smokeless tobacco: Not on file   Comment: Marijuana 30 years  . Alcohol Use: Yes      Review of Systems  Constitutional: Negative for fever, chills and fatigue.  Musculoskeletal: Positive for joint swelling.  All other systems reviewed and are negative.    Allergies  Review of patient's allergies indicates no known allergies.  Home Medications   Current Outpatient Rx  Name Route Sig Dispense Refill  . DOXYCYCLINE HYCLATE 100 MG PO TABS Oral Take 100 mg by mouth 2 (two) times daily. Started  6/10 for 10 days    . ESOMEPRAZOLE MAGNESIUM 40 MG PO CPDR Oral Take 40 mg by mouth daily before breakfast.    . GABAPENTIN 300 MG PO CAPS Oral Take 300 mg by mouth 2 (two) times daily.    Marland Kitchen NAPROXEN 500 MG PO TABS Oral Take 500 mg by mouth 2 (two) times daily as needed. For pain      BP 111/74  Pulse 83  Temp 98.1 F (36.7 C)  Resp 16  SpO2 96%  Physical Exam  Nursing note and vitals reviewed. Constitutional: He is oriented to person, place, and time. He appears well-developed and well-nourished. No distress.  HENT:  Head: Normocephalic and atraumatic.  Eyes: EOM are normal. Pupils are equal, round, and reactive to light.  Pulmonary/Chest: Effort normal. No respiratory distress.  Abdominal: Soft. He exhibits no distension. There is no tenderness. There is no rebound and no guarding.  Musculoskeletal: He exhibits tenderness.       Left forearm: He exhibits tenderness and swelling.       Arms:      Areas marked are erythematous with swelling and fluctuance distal to the elbow.  Full ROM of the left elbow with swelling over the olecranon.  Edema from the elbow to the wrist  Neurological: He is alert and oriented to person, place, and time. He has normal strength. No sensory deficit.  Skin: Skin is warm and dry. There is erythema.    ED Course  ARTHOCENTESIS Date/Time: 05/12/2012 9:20 AM Performed by: Gwyneth Sprout Authorized by: Gwyneth Sprout Consent: Verbal consent obtained. Consent given by: patient Patient identity confirmed: verbally with patient Time out: Immediately prior to procedure a "time out" was called to verify the correct patient, procedure, equipment, support staff and site/side marked as required. Indications: joint swelling and pain  Body area: elbow (left elbow bursa) Local anesthesia used: no Patient sedated: no Preparation: Patient was prepped and draped in the usual sterile fashion. Needle gauge: 20 G Approach: inferior Aspirate: yellow and  blood-tinged Aspirate amount: 5 ml Patient tolerance: Patient tolerated the procedure well with no immediate complications.   (including critical care time)  Labs Reviewed  CELL COUNT + DIFF,  W/ CRYST-SYNVL FLD - Abnormal; Notable for the following:    Color, Synovial RED (*)     Appearance-Synovial CLOUDY (*)     WBC, Synovial 2181 (*)     Neutrophil, Synovial 95 (*)     Monocyte-Macrophage-Synovial Fluid 1 (*)     All other components within normal limits  BASIC METABOLIC PANEL - Abnormal; Notable for the following:    Glucose, Bld 168 (*)     All other components within normal limits  GRAM STAIN  CBC  DIFFERENTIAL  BODY FLUID CULTURE   No results found.   No diagnosis found.    MDM   Patient with left arm pain and swelling for the last 5 days. He saw his PCP on Monday and was placed on doxycycline however the redness and swelling has worsened. He denies any systemic symptoms. On exam he has palpable fluid in his proximal forearm as well as over his olecranon.  Pt has full ROM of the joint without concern for septic arthritis however concern for possible infected bursa.  Fluctuance in the proximal forearm had needle inserted with synovial fluid removed.  Pt also has presence of cellulitis in the arm despite being on doxy.  Will give IV dose of vanc and send fluid for culture, cell count, gram stain and crystals.  The lab here is unable to do lactate.  Will place in CDU for obs and will speak with hand when results are back if signs of infection.        Gwyneth Sprout, MD 05/12/12 1528

## 2012-05-12 NOTE — ED Notes (Signed)
Pt. Stated, I started having pain in my lt. Elbow and went to Dr. On Sheral Flow and was given an antibiotic, and I was told to come here if no better.

## 2012-05-14 ENCOUNTER — Emergency Department (HOSPITAL_COMMUNITY)
Admission: EM | Admit: 2012-05-14 | Discharge: 2012-05-14 | Disposition: A | Discharge status: Home Health | Payer: Managed Care, Other (non HMO) | Attending: Emergency Medicine | Admitting: Emergency Medicine

## 2012-05-14 ENCOUNTER — Encounter (HOSPITAL_COMMUNITY): Payer: Self-pay | Admitting: Emergency Medicine

## 2012-05-14 DIAGNOSIS — Z79899 Other long term (current) drug therapy: Secondary | ICD-10-CM | POA: Insufficient documentation

## 2012-05-14 DIAGNOSIS — L03114 Cellulitis of left upper limb: Secondary | ICD-10-CM

## 2012-05-14 DIAGNOSIS — IMO0002 Reserved for concepts with insufficient information to code with codable children: Secondary | ICD-10-CM | POA: Insufficient documentation

## 2012-05-14 DIAGNOSIS — M1990 Unspecified osteoarthritis, unspecified site: Secondary | ICD-10-CM | POA: Insufficient documentation

## 2012-05-14 DIAGNOSIS — E785 Hyperlipidemia, unspecified: Secondary | ICD-10-CM | POA: Insufficient documentation

## 2012-05-14 DIAGNOSIS — Z87891 Personal history of nicotine dependence: Secondary | ICD-10-CM | POA: Insufficient documentation

## 2012-05-14 DIAGNOSIS — Z791 Long term (current) use of non-steroidal anti-inflammatories (NSAID): Secondary | ICD-10-CM | POA: Insufficient documentation

## 2012-05-14 LAB — BODY FLUID CULTURE

## 2012-05-14 MED ORDER — IBUPROFEN 800 MG PO TABS: 800.0000 mg | ORAL_TABLET | Freq: Three times a day (TID) | ORAL | Status: AC | PRN

## 2012-05-14 MED ORDER — HYDROCODONE-ACETAMINOPHEN 5-325 MG PO TABS: ORAL_TABLET | ORAL | Status: AC

## 2012-05-14 NOTE — ED Notes (Signed)
Patient has Staph infection. Chart sent to EDP office for review.

## 2012-05-14 NOTE — ED Notes (Signed)
Pt here on Wednesday for cellulitis of left arm; pt here for recheck; pt sts redness has gone down but some increase in swelling; pt here for recheck

## 2012-05-14 NOTE — ED Provider Notes (Signed)
History     CSN: [id number]  Arrival date & time 05/14/12  7829   First MD Initiated Contact with Patient 05/14/12 [phone]      Chief Complaint  Patient presents with  . Follow-up    (Consider location/radiation/quality/duration/timing/severity/associated sxs/prior treatment) HPI Comments: Patient presents for followup of left forearm cellulitis with associated left elbow bursitis. Patient was seen in emergency department 2 days ago and given IV antibiotics as well as drainage of the bursa. Patient is currently taking oral doxycycline. Patient reports improvement in the redness of the skin on the forearm. He states that swelling and soreness is slightly increased. Patient continues to deny fevers, nausea or vomiting. Movement or palpation makes the symptoms worse. Nothing makes the symptoms better. Course is gradually improving. Onset was acute.  The history is provided by the patient.    Past Medical History  Diagnosis Date  . ALLERGIC RHINITIS 12/31/2007  . DEGENERATIVE JOINT DISEASE 07/18/2007  . Extrinsic asthma, unspecified 09/08/2009  . GERD 07/18/2007  . HYPERLIPIDEMIA 08/14/2010  . Impaired glucose tolerance 09/23/2011  . COUGH, CHRONIC 07/18/2007  . OBESITY 07/18/2007    History reviewed. No pertinent past surgical history.  History reviewed. No pertinent family history.  History  Substance Use Topics  . Smoking status: Former Games developer  . Smokeless tobacco: Not on file   Comment: Marijuana 30 years  . Alcohol Use: Yes      Review of Systems  Constitutional: Negative for fever.  Gastrointestinal: Negative for nausea, vomiting, abdominal pain and diarrhea.  Musculoskeletal: Negative for myalgias.  Skin: Positive for color change and rash. Negative for wound.    Allergies  Review of patient's allergies indicates no known allergies.  Home Medications   Current Outpatient Rx  Name Route Sig Dispense Refill  . ACETAMINOPHEN 500 MG PO TABS Oral Take 1,000 mg by mouth  every 6 (six) hours as needed. For pain    . DOXYCYCLINE HYCLATE 100 MG PO TABS Oral Take 100 mg by mouth 2 (two) times daily. Started 6/10 for 10 days    . ESOMEPRAZOLE MAGNESIUM 40 MG PO CPDR Oral Take 40 mg by mouth daily before breakfast.    . GABAPENTIN 300 MG PO CAPS Oral Take 300 mg by mouth 2 (two) times daily.    Marland Kitchen NAPROXEN 500 MG PO TABS Oral Take 500 mg by mouth 2 (two) times daily as needed. For pain      BP 151/93  Pulse 89  Temp 98 F (36.7 C) (Oral)  Resp 22  SpO2 99%  Physical Exam  Nursing note and vitals reviewed. Constitutional: He appears well-developed and well-nourished.  HENT:  Head: Normocephalic and atraumatic.  Eyes: Conjunctivae are normal.  Neck: Normal range of motion. Neck supple.  Cardiovascular:  Pulses:      Radial pulses are 2+ on the right side, and 2+ on the left side.  Pulmonary/Chest: No respiratory distress.  Musculoskeletal: Normal range of motion.       Right shoulder: He exhibits tenderness, swelling, effusion and pain. He exhibits normal range of motion and normal pulse.       Arms: Neurological: He is alert.  Skin: Skin is warm and dry.  Psychiatric: He has a normal mood and affect.    ED Course  Procedures (including critical care time)  Labs Reviewed - No data to display No results found.   1. Left arm cellulitis     7:51 AM Patient seen and examined. Previous records reviewed. Patient continues to  have an elbow effusion but the erythema is much improved. Patient instructed to continue doxycycline. Urged to return if worsening redness or fever. He verbalizes understanding and agrees with plan.    Vital signs reviewed and are as follows: Filed Vitals:   05/14/12 0725  BP: 151/93  Pulse: 89  Temp: 98 F (36.7 C)  Resp: 22   Pt urged to return with worsening pain, worsening swelling, expanding area of redness or streaking up extremity, fever, or any other concerns. Urged to take complete course of antibiotics as  prescribed. Counseled to take pain medications as prescribed. Pt verbalizes understanding and agrees with plan.  Patient counseled on use of narcotic pain medications. Counseled not to combine these medications with others containing tylenol. Urged not to drink alcohol, drive, or perform any other activities that requires focus while taking these medications. The patient verbalizes understanding and agrees with the plan.  MDM  Bursitis, cellulitis -- erythema is improving. Effusion continues.         [place], [place] 05/14/12 [phone]

## 2012-05-14 NOTE — Discharge Instructions (Signed)
Please read and follow all provided instructions.  Your diagnoses today include:  1. Left arm cellulitis     Tests performed today include:  Vital signs. See below for your results today.   Medications prescribed:   Vicodin (hydrocodone/acetaminophen) - narcotic pain medication  You have been prescribed narcotic pain medication such as Vicodin or Percocet: DO NOT drive or perform any activities that require you to be awake and alert because this medicine can make you drowsy. BE VERY CAREFUL not to take multiple medicines containing Tylenol (also called acetaminophen). Doing so can lead to an overdose which can damage your liver and cause liver failure and possibly death.    Ibuprofen - anti-inflammatory pain medication  Do not exceed 800mg  ibuprofen every 8 hours  You have been prescribed an anti-inflammatory medication or NSAID. Take with food. Take smallest effective dose for the shortest duration needed for your pain. Stop taking if you experience stomach pain or vomiting.   Take any prescribed medications only as directed.  Home care instructions:  Follow any educational materials contained in this packet.  Continue taking the doxycycline as previously prescribed.   BE VERY CAREFUL not to take multiple medicines containing Tylenol (also called acetaminophen). Doing so can lead to an overdose which can damage your liver and cause liver failure and possibly death.   Follow-up instructions: Please follow-up with your primary care provider in the next 3 days for further evaluation of your symptoms. If you do not have a primary care doctor -- see below for referral information.   Return instructions:   Please return to the Emergency Department if you experience worsening symptoms.   Return with fever or worsening redness.   Please return if you have any other emergent concerns.  Additional Information:  Your vital signs today were: BP 151/93  Pulse 89  Temp 98 F (36.7  C) (Oral)  Resp 22  SpO2 99% If your blood pressure (BP) was elevated above 135/85 this visit, please have this repeated by your doctor within one month. -------------- No Primary Care Doctor Call [facility]  [phone] Other agencies that provide inexpensive medical care    [facility]  [phone]    [facility]  [phone]    [facility]  [phone]    [facility]  [phone]    [facility]  [phone]    [facility]  [phone] -------------- RESOURCE GUIDE:  Dental Problems  Patients with Medicaid: [facility] [phone] [address]                                            [phone] [address] Phone:  [phone]                                                   Phone:  [phone]  If unable to pay or uninsured, contact:  [facility] or [facility]. to become qualified for the adult dental clinic.  Chronic Pain Problems Contact [facility]  [phone] Patients need to be referred by their  primary care doctor.  Insufficient Money for Medicine Contact [employer]:  call "211" or [facility] [phone].  Psychological Services [facility]  [phone] [facility]  [phone] [facility]   [phone] (emergency services [phone])  Substance Abuse Resources [facility]  [phone] [facility] [phone] [facility] [place] [phone] Floydene Flock [phone] Residential & Outpatient Substance Abuse Program  [phone]  Abuse/Neglect [facility] Child Abuse Hotline [phone])065-9925 [facility] Child Abuse Hotline 2894114957 (After Hours)  Emergency Shelter [facility] [phone]  [facility] [place] [facility] [place] [phone] [place] [facility] [phone]  [facility] Resources  [facility]  [place]     [employer]                          [facility] Dept. [address] [place]                       [address]      [address]  Blondell Reveal Phone:  [phone]                                   Phone:  [phone]                 Phone:  [phone]  [facility] Phone:  [phone]  [facility] Child Abuse Hotline [phone] [phone] (After Hours)

## 2012-05-14 NOTE — ED Provider Notes (Signed)
Medical screening examination/treatment/procedure(s) were performed by non-physician practitioner and as supervising physician I was immediately available for consultation/collaboration.   Joshuwa Vecchio, MD 05/14/12 1642 

## 2012-05-15 NOTE — ED Provider Notes (Signed)
Medical screening examination/treatment/procedure(s) were conducted as a shared visit with non-physician practitioner(s) and myself.  I personally evaluated the patient during the encounter   Gwyneth Sprout, MD 05/15/12 1104

## 2012-05-17 NOTE — ED Notes (Signed)
Patient needs to be contacted to ensure improvement in symptoms. The vanc and doxycyline should cover.Culture with staph looks sensitive to these abx.He needs to f/u with hand. Melvyn Novas or someone else of choosing per [facility].

## 2012-05-17 NOTE — Progress Notes (Signed)
Observation review is complete for 05/12/2012 visit.

## 2012-05-17 NOTE — ED Notes (Signed)
Patient informed of results.  

## 2012-05-19 ENCOUNTER — Telehealth: Payer: Self-pay

## 2012-05-19 MED ORDER — DOXYCYCLINE HYCLATE 100 MG PO TABS: ORAL_TABLET | ORAL | Status: DC

## 2012-05-19 NOTE — Telephone Encounter (Signed)
Called the patient informed rx sent to pharmacy.

## 2012-05-19 NOTE — Telephone Encounter (Signed)
Done per emr  [facility] [place];  I [place] [facility] [place]

## 2012-05-19 NOTE — Telephone Encounter (Signed)
[place] cellulitis on Lt arm is 50% improved since OV but there has been no improvement in the last 2 days. Pt denies fever, redness, drainage but states there is some swelling. Pt has appt with Ortho 06/24 regarding bursitis of Lt elbow but will be out of Doxy given in ER on 06/12. Pt is requesting refill of ABX to last until this appt? Pt is adamant that after ER visit 06/12 he was told to return to ER 06/14 for IV ABX but did not received any because the doctor noted improvement but he believes he still needs "aggressive" treatment". Please advise.

## 2012-11-16 ENCOUNTER — Telehealth: Payer: Self-pay | Admitting: Internal Medicine

## 2012-11-16 NOTE — Telephone Encounter (Signed)
Patient is calling for refill for Flonase. Is not listed on medication list but states only uses when has allergy symptoms. May have to use 3 days and then symptoms are gone. Wanted to get refilled "before the end of the year". Declines appointment when advised MD may want to see before refills since  Has not been seen for this symptoms since 09-25-11. Declines triage. PLEASE CALL AND ADVISE IF MD WILL RENEW THIS MEDICATION

## 2012-11-17 MED ORDER — FLUTICASONE PROPIONATE 50 MCG/ACT NA SUSP: 2.0000 | Freq: Every day | NASAL | Status: DC

## 2012-11-17 NOTE — Telephone Encounter (Signed)
Patient informed. 

## 2012-11-17 NOTE — Telephone Encounter (Signed)
Done erx 

## 2013-12-05 ENCOUNTER — Ambulatory Visit (INDEPENDENT_AMBULATORY_CARE_PROVIDER_SITE_OTHER)
Admission: RE | Admit: 2013-12-05 | Discharge: 2013-12-05 | Disposition: A | Discharge status: Home / Self Care | Payer: Managed Care, Other (non HMO) | Source: Ambulatory Visit | Attending: Internal Medicine | Admitting: Internal Medicine

## 2013-12-05 ENCOUNTER — Ambulatory Visit (INDEPENDENT_AMBULATORY_CARE_PROVIDER_SITE_OTHER): Payer: Managed Care, Other (non HMO) | Admitting: Internal Medicine

## 2013-12-05 ENCOUNTER — Encounter: Payer: Self-pay | Admitting: Internal Medicine

## 2013-12-05 VITALS — BP 136/86 | HR 81 | Temp 98.0°F | Resp 16 | Ht 73.0 in | Wt 235.0 lb

## 2013-12-05 DIAGNOSIS — R059 Cough, unspecified: Secondary | ICD-10-CM

## 2013-12-05 DIAGNOSIS — R05 Cough: Secondary | ICD-10-CM

## 2013-12-05 DIAGNOSIS — J209 Acute bronchitis, unspecified: Secondary | ICD-10-CM

## 2013-12-05 MED ORDER — AZITHROMYCIN 500 MG PO TABS: 500.0000 mg | ORAL_TABLET | Freq: Every day | ORAL | Status: DC

## 2013-12-05 MED ORDER — HYDROCODONE-HOMATROPINE 5-1.5 MG/5ML PO SYRP: 5.0000 mL | ORAL_SOLUTION | Freq: Three times a day (TID) | ORAL | Status: DC | PRN

## 2013-12-05 NOTE — Progress Notes (Signed)
Pre visit review using our clinic review tool, if applicable. No additional management support is needed unless otherwise documented below in the visit note. 

## 2013-12-05 NOTE — Patient Instructions (Signed)

## 2013-12-06 ENCOUNTER — Encounter: Payer: Self-pay | Admitting: Internal Medicine

## 2013-12-06 NOTE — Progress Notes (Signed)
   Subjective:    Patient ID: [name], male    DOB: 10/11/1951, 63 y.o.   MRN: [id number]  Cough This is a new problem. The current episode started in the past 7 days. The problem has been gradually worsening. The problem occurs every few hours. The cough is productive of purulent sputum. Associated symptoms include a sore throat. Pertinent negatives include no chest pain, chills, ear congestion, ear pain, fever, headaches, heartburn, hemoptysis, myalgias, nasal congestion, postnasal drip, rash, rhinorrhea, shortness of breath, sweats, weight loss or wheezing. He has tried OTC cough suppressant for the symptoms. The treatment provided mild relief. There is no history of asthma, bronchiectasis, bronchitis, COPD, emphysema, environmental allergies or pneumonia.      Review of Systems  Constitutional: Negative.  Negative for fever, chills, weight loss, diaphoresis, appetite change and fatigue.  HENT: Positive for sore throat. Negative for ear pain, nosebleeds, postnasal drip, rhinorrhea, sinus pressure, sneezing, tinnitus, trouble swallowing and voice change.   Eyes: Negative.   Respiratory: Positive for cough. Negative for apnea, hemoptysis, choking, chest tightness, shortness of breath and wheezing.   Cardiovascular: Negative.  Negative for chest pain, palpitations and leg swelling.  Gastrointestinal: Negative.  Negative for heartburn, vomiting, abdominal pain, diarrhea, constipation and anal bleeding.  Endocrine: Negative.   Genitourinary: Negative.   Musculoskeletal: Negative.  Negative for myalgias.  Skin: Negative.  Negative for rash.  Allergic/Immunologic: Negative.  Negative for environmental allergies, food allergies and immunocompromised state.  Neurological: Negative.  Negative for dizziness, tremors, syncope, weakness, light-headedness and headaches.  Hematological: Negative.  Negative for adenopathy. Does not bruise/bleed easily.  Psychiatric/Behavioral: Negative.          Objective:   Physical Exam  Vitals reviewed. Constitutional: He is oriented to person, place, and time. He appears well-developed and well-nourished.  Non-toxic appearance. He does not have a sickly appearance. He does not appear ill. No distress.  HENT:  Head: Normocephalic and atraumatic.  Mouth/Throat: Oropharynx is clear and moist. No oropharyngeal exudate.  Eyes: Conjunctivae are normal. Right eye exhibits no discharge. Left eye exhibits no discharge. No scleral icterus.  Neck: Normal range of motion. Neck supple. No JVD present. No tracheal deviation present. No thyromegaly present.  Cardiovascular: Normal rate, regular rhythm, normal heart sounds and intact distal pulses.  Exam reveals no gallop and no friction rub.   No murmur heard. Pulmonary/Chest: Effort normal and breath sounds normal. No accessory muscle usage or stridor. Not tachypneic. No respiratory distress. He has no decreased breath sounds. He has no wheezes. He has no rhonchi. He has no rales. He exhibits no tenderness.  Abdominal: Soft. Bowel sounds are normal. He exhibits no distension and no mass. There is no tenderness. There is no rebound and no guarding.  Musculoskeletal: Normal range of motion. He exhibits no edema and no tenderness.  Lymphadenopathy:    He has no cervical adenopathy.  Neurological: He is oriented to person, place, and time.  Skin: Skin is warm and dry. No rash noted. He is not diaphoretic. No erythema. No pallor.  Psychiatric: He has a normal mood and affect. His behavior is normal. Judgment normal.          Assessment & Plan:

## 2013-12-06 NOTE — Assessment & Plan Note (Addendum)
His CXR shows emhysema so I have asked him to return for further testing

## 2013-12-06 NOTE — Assessment & Plan Note (Signed)
I will treat the infection with Zpak and will control the cough with hycodan 

## 2013-12-16 ENCOUNTER — Telehealth: Payer: Self-pay

## 2013-12-16 MED ORDER — ESOMEPRAZOLE MAGNESIUM 40 MG PO CPDR: 40.0000 mg | DELAYED_RELEASE_CAPSULE | Freq: Every day | ORAL | Status: DC

## 2013-12-16 NOTE — Telephone Encounter (Signed)
[facility]

## 2014-04-13 ENCOUNTER — Other Ambulatory Visit: Payer: Self-pay | Admitting: Internal Medicine

## 2014-06-27 DIAGNOSIS — S0501XS Injury of conjunctiva and corneal abrasion without foreign body, right eye, sequela: Secondary | ICD-10-CM | POA: Insufficient documentation

## 2014-07-04 ENCOUNTER — Encounter: Payer: Self-pay | Admitting: Internal Medicine

## 2014-07-04 ENCOUNTER — Ambulatory Visit (INDEPENDENT_AMBULATORY_CARE_PROVIDER_SITE_OTHER): Payer: Managed Care, Other (non HMO) | Admitting: Internal Medicine

## 2014-07-04 VITALS — BP 160/102 | HR 73 | Temp 98.2°F | Wt 239.0 lb

## 2014-07-04 DIAGNOSIS — R030 Elevated blood-pressure reading, without diagnosis of hypertension: Secondary | ICD-10-CM

## 2014-07-04 DIAGNOSIS — S8010XA Contusion of unspecified lower leg, initial encounter: Secondary | ICD-10-CM

## 2014-07-04 DIAGNOSIS — S8012XA Contusion of left lower leg, initial encounter: Secondary | ICD-10-CM

## 2014-07-04 NOTE — Progress Notes (Signed)
   Subjective:    Patient ID: Joseph FarberRobert M Geister, male    DOB: 1951-06-28, 63 y.o.   MRN: [id number]  HPI  4-5 weeks ago his  L shin was struck by a steel beam resulting in a significant hematoma with bruising as far as the foot/heel.  Approximately 3-4 weeks ago the lesion drainined clear material and decreased in size.  He's had some residual tenderness but there has been progressive, albeit slow decrease in size of the lesion. His wife is still concerned and made this appointment.    Review of Systems    Chest pain, palpitations, tachycardia, exertional dyspnea, paroxysmal nocturnal dyspnea, claudication or edema are absent.         Objective:   Physical Exam   Positive or significant findings include:  There is a 4 x 4.5 cm slightly fluctuant boss over the left medial shin. This is not significantly tender to palpation. Homans sign is negative. Repeat blood pressure was 118/80.  Appears healthy and well-nourished & in no acute distress No carotid bruits are present.No neck pain distention present at 10 - 15 degrees. Thyroid normal to palpation Heart rhythm and rate are normal with no gallop or murmur Chest is clear with no increased work of breathing There is no evidence of aortic aneurysm or renal artery bruits Abdomen soft with no organomegaly or masses. No HJR No clubbing, cyanosis or edema present. Pedal pulses are intact  No ischemic skin changes are present . Fingernails healthy  Alert and oriented. Strength, tone, DTRs reflexes normal          Assessment & Plan:  #1 posttraumatic hematoma left shin; no evidence of complications such as deep venous thrombosis  #2 elevated hypertension, transitory  Plan: Elevation  & warm compresses to the hematoma as much as possible

## 2014-07-04 NOTE — Progress Notes (Signed)
Pre visit review using our clinic review tool, if applicable. No additional management support is needed unless otherwise documented below in the visit note. 

## 2014-07-04 NOTE — Patient Instructions (Signed)
Use warm moist compresses to 3 times a day to the affected area.Elevation & compression will help resolve the hematoma as we discussed.

## 2015-01-17 ENCOUNTER — Encounter: Payer: Self-pay | Admitting: Gastroenterology

## 2015-01-17 ENCOUNTER — Ambulatory Visit (INDEPENDENT_AMBULATORY_CARE_PROVIDER_SITE_OTHER): Payer: Managed Care, Other (non HMO) | Admitting: Internal Medicine

## 2015-01-17 ENCOUNTER — Encounter: Payer: Self-pay | Admitting: Internal Medicine

## 2015-01-17 ENCOUNTER — Other Ambulatory Visit (INDEPENDENT_AMBULATORY_CARE_PROVIDER_SITE_OTHER): Payer: Managed Care, Other (non HMO)

## 2015-01-17 VITALS — BP 160/100 | HR 68 | Temp 98.5°F | Ht 72.0 in | Wt 237.0 lb

## 2015-01-17 DIAGNOSIS — Z0000 Encounter for general adult medical examination without abnormal findings: Secondary | ICD-10-CM

## 2015-01-17 DIAGNOSIS — N529 Male erectile dysfunction, unspecified: Secondary | ICD-10-CM | POA: Insufficient documentation

## 2015-01-17 DIAGNOSIS — Z23 Encounter for immunization: Secondary | ICD-10-CM

## 2015-01-17 DIAGNOSIS — R7302 Impaired glucose tolerance (oral): Secondary | ICD-10-CM

## 2015-01-17 DIAGNOSIS — J069 Acute upper respiratory infection, unspecified: Secondary | ICD-10-CM

## 2015-01-17 DIAGNOSIS — E785 Hyperlipidemia, unspecified: Secondary | ICD-10-CM

## 2015-01-17 LAB — URINALYSIS, ROUTINE W REFLEX MICROSCOPIC
BILIRUBIN URINE: NEGATIVE
Hgb urine dipstick: NEGATIVE
Ketones, ur: NEGATIVE
Leukocytes, UA: NEGATIVE
NITRITE: NEGATIVE
RBC / HPF: NONE SEEN (ref 0–?)
Specific Gravity, Urine: 1.02 (ref 1.000–1.030)
Total Protein, Urine: NEGATIVE
UROBILINOGEN UA: 0.2 (ref 0.0–1.0)
Urine Glucose: NEGATIVE
WBC UA: NONE SEEN (ref 0–?)
pH: 7 (ref 5.0–8.0)

## 2015-01-17 LAB — CBC WITH DIFFERENTIAL/PLATELET
Basophils Absolute: 0 10*3/uL (ref 0.0–0.1)
Basophils Relative: 0.8 % (ref 0.0–3.0)
EOS PCT: 3.4 % (ref 0.0–5.0)
Eosinophils Absolute: 0.2 10*3/uL (ref 0.0–0.7)
HCT: 44.5 % (ref 39.0–52.0)
HEMOGLOBIN: 14.7 g/dL (ref 13.0–17.0)
Lymphocytes Relative: 29.1 % (ref 12.0–46.0)
Lymphs Abs: 1.7 10*3/uL (ref 0.7–4.0)
MCHC: 33.2 g/dL (ref 30.0–36.0)
MCV: 89.3 fl (ref 78.0–100.0)
MONOS PCT: 16.8 % — AB (ref 3.0–12.0)
Monocytes Absolute: 1 10*3/uL (ref 0.1–1.0)
NEUTROS PCT: 49.9 % (ref 43.0–77.0)
Neutro Abs: 2.9 10*3/uL (ref 1.4–7.7)
Platelets: 200 10*3/uL (ref 150.0–400.0)
RBC: 4.98 Mil/uL (ref 4.22–5.81)
RDW: 13.5 % (ref 11.5–15.5)
WBC: 5.8 10*3/uL (ref 4.0–10.5)

## 2015-01-17 LAB — BASIC METABOLIC PANEL
BUN: 18 mg/dL (ref 6–23)
CALCIUM: 9.6 mg/dL (ref 8.4–10.5)
CO2: 27 meq/L (ref 19–32)
CREATININE: 1.02 mg/dL (ref 0.40–1.50)
Chloride: 108 mEq/L (ref 96–112)
GFR: 78.65 mL/min (ref >60.00)
GLUCOSE: 80 mg/dL (ref 70–99)
Potassium: 4.8 mEq/L (ref 3.5–5.1)
Sodium: 142 mEq/L (ref 135–145)

## 2015-01-17 LAB — HEPATIC FUNCTION PANEL
ALBUMIN: 4.1 g/dL (ref 3.5–5.2)
ALT: 30 U/L (ref 0–53)
AST: 21 U/L (ref 0–37)
Alkaline Phosphatase: 80 U/L (ref 39–117)
Bilirubin, Direct: 0.1 mg/dL (ref 0.0–0.3)
TOTAL PROTEIN: 6.5 g/dL (ref 6.0–8.3)
Total Bilirubin: 0.6 mg/dL (ref 0.2–1.2)

## 2015-01-17 LAB — LIPID PANEL
CHOL/HDL RATIO: 4
Cholesterol: 180 mg/dL (ref 0–200)
HDL: 46.2 mg/dL (ref >39.00)
LDL CALC: 113 mg/dL — AB (ref 0–99)
NonHDL: 133.8
Triglycerides: 104 mg/dL (ref 0.0–149.0)
VLDL: 20.8 mg/dL (ref 0.0–40.0)

## 2015-01-17 LAB — TSH: TSH: 0.83 u[IU]/mL (ref 0.35–4.50)

## 2015-01-17 LAB — HEMOGLOBIN A1C: HEMOGLOBIN A1C: 5.8 % (ref 4.6–6.5)

## 2015-01-17 LAB — PSA: PSA: 0.76 ng/mL (ref 0.10–4.00)

## 2015-01-17 MED ORDER — AZITHROMYCIN 250 MG PO TABS: ORAL_TABLET | ORAL | Status: DC

## 2015-01-17 MED ORDER — TADALAFIL 20 MG PO TABS: 20.0000 mg | ORAL_TABLET | Freq: Every day | ORAL | Status: DC | PRN

## 2015-01-17 NOTE — Progress Notes (Signed)
Pre visit review using our clinic review tool, if applicable. No additional management support is needed unless otherwise documented below in the visit note. 

## 2015-01-17 NOTE — Assessment & Plan Note (Signed)
Mild to mod, for antibx course,  to f/u any worsening symptoms or concerns 

## 2015-01-17 NOTE — Assessment & Plan Note (Signed)
Ok for cialis trial,  to f/u any worsening symptoms or concerns  

## 2015-01-17 NOTE — Assessment & Plan Note (Signed)
stable overall by history and exam, recent data reviewed with pt, and pt to continue medical treatment as before,  to f/u any worsening symptoms or concerns Lab Results  Component Value Date   LDLCALC 128* 09/28/2009   For f/u lab

## 2015-01-17 NOTE — Progress Notes (Signed)
Subjective:    Patient ID: [name], male    DOB: [birth year], 64 y.o.   MRN: [id number]  HPI  Here for wellness and f/u;  Overall doing ok;  Pt denies CP, worsening SOB, DOE, wheezing, orthopnea, PND, worsening LE edema, palpitations, dizziness or syncope.  Pt denies neurological change such as new headache, facial or extremity weakness.  Pt denies polydipsia, polyuria, or low sugar symptoms. Pt states overall good compliance with treatment and medications, good tolerability, and has been trying to follow lower cholesterol diet.  Pt denies worsening depressive symptoms, suicidal ideation or panic. No fever, night sweats, wt loss, loss of appetite, or other constitutional symptoms.  Pt states good ability with ADL's, has low fall risk, home safety reviewed and adequate, no other significant changes in hearing or vision, and only occasionally active with exercise, as he has end stage right knee DJD, needs TKR, but has been putting off. 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 BP at home normally < 140/90, nervous today Incidentally today -  Here with 2 days acute onset fever, facial pain, pressure, headache, general weakness and malaise, and greenish d/c, with mild ST and cough.  A;so with worsening ED symtpoms in past 6 mo, asks for cialis to try  Past Medical History  Diagnosis Date  . ALLERGIC RHINITIS 12/31/2007  . DEGENERATIVE JOINT DISEASE 07/18/2007  . Extrinsic asthma, unspecified 09/08/2009  . GERD 07/18/2007  . HYPERLIPIDEMIA 08/14/2010  . Impaired glucose tolerance 09/23/2011  . COUGH, CHRONIC 07/18/2007  . OBESITY 07/18/2007   No past surgical history on file.  reports that he has quit smoking. He does not have any smokeless tobacco history on file. He reports that he does not drink alcohol. His drug history is not on file. family history is not on file. No Known Allergies Current Outpatient Prescriptions on File Prior to Visit  Medication Sig Dispense Refill  . acetaminophen  (TYLENOL) 500 MG tablet Take 1,000 mg by mouth every 6 (six) hours as needed. For pain    . esomeprazole (NEXIUM) 40 MG capsule Take 1 capsule (40 mg total) by mouth daily before breakfast. 90 capsule 3  . fluticasone (FLONASE) 50 MCG/ACT nasal spray Place 2 sprays into the nose daily. 16 g 2  . gabapentin (NEURONTIN) 300 MG capsule Take 300 mg by mouth 2 (two) times daily.    . naproxen (NAPROSYN) 500 MG tablet Take 500 mg by mouth 2 (two) times daily as needed. For pain     No current facility-administered medications on file prior to visit.    Review of Systems Constitutional: Negative for increased diaphoresis, other activity, appetite or other siginficant weight change  HENT: Negative for worsening hearing loss, ear pain, facial swelling, mouth sores and neck stiffness.   Eyes: Negative for other worsening pain, redness or visual disturbance.  Respiratory: Negative for shortness of breath and wheezing.   Cardiovascular: Negative for chest pain and palpitations.  Gastrointestinal: Negative for diarrhea, blood in stool, abdominal distention or other pain Genitourinary: Negative for hematuria, flank pain or change in urine volume.  Musculoskeletal: Negative for myalgias or other joint complaints.  Skin: Negative for color change and wound.  Neurological: Negative for syncope and numbness. other than noted Hematological: Negative for adenopathy. or other swelling Psychiatric/Behavioral: Negative for hallucinations, self-injury, decreased concentration or other worsening agitation.      Objective:   Physical Exam BP 160/100 mmHg  Pulse 68  Temp(Src) 98.5 F (36.9 C) (Oral)  Ht 6' (1.829 m)  Wt 237 lb (107.502 kg)  BMI 32.14 kg/m2 VS noted,  Constitutional: Pt is oriented to person, place, and time. Appears well-developed and well-nourished. Lavella Lemons/obese Head: Normocephalic and atraumatic.  Right Ear: External ear normal.  Left Ear: External ear normal.  Nose: Nose normal.    Mouth/Throat: Oropharynx is clear and moist.  Bilat tm's with mild erythema.  Max sinus areas mild tender.  Pharynx with mild erythema, no exudate Eyes: Conjunctivae and EOM are normal. Pupils are equal, round, and reactive to light.  Neck: Normal range of motion. Neck supple. No JVD present. No tracheal deviation present.  Cardiovascular: Normal rate, regular rhythm, normal heart sounds and intact distal pulses.   Pulmonary/Chest: Effort normal and breath sounds without rales or wheezing  Abdominal: Soft. Bowel sounds are normal. NT. No HSM  Musculoskeletal: Normal range of motion. Exhibits no edema.  Lymphadenopathy:  Has no cervical adenopathy.  Neurological: Pt is alert and oriented to person, place, and time. Pt has normal reflexes. No cranial nerve deficit. Motor grossly intact Skin: Skin is warm and dry. No rash noted.  Psychiatric:  Has normal mood and affect. Behavior is normal. mild nervous Has severe right knee bony changes c/w DJD    Assessment & Plan:

## 2015-01-17 NOTE — Assessment & Plan Note (Signed)

## 2015-01-17 NOTE — Assessment & Plan Note (Signed)
Asymtp, for a1c,  to f/u any worsening symptoms or concerns  

## 2015-01-17 NOTE — Patient Instructions (Signed)
Please take all new medication as prescribed - [facility]), and the cialis is given in the hardcopy prescription to "shop around"  Please continue all other medications as before, and refills have been done if requested.  Please have the pharmacy call with any other refills you may need.  Please continue your efforts at being more active, low cholesterol diet, and weight control.  You are otherwise up to date with prevention measures today.  Please keep your appointments with your specialists as you may have planned  You will be contacted regarding the referral for:   Colonoscopy  Please go to the LAB in the Basement (turn left off the elevator) for the tests to be done today  You will be contacted by phone if any changes need to be made immediately.  Otherwise, you will receive a letter about your results with an explanation, but please check with MyChart first.  Please remember to sign up for MyChart if you have not done so, as this will be important to you in the future with finding out test results, communicating by private email, and scheduling acute appointments online when needed.  Please return in 1 year for your yearly visit, or sooner if needed, with Lab testing done 3-5 days before

## 2015-02-08 ENCOUNTER — Other Ambulatory Visit: Payer: Self-pay

## 2015-02-08 ENCOUNTER — Telehealth: Payer: Self-pay | Admitting: Internal Medicine

## 2015-02-08 MED ORDER — FLUTICASONE PROPIONATE 50 MCG/ACT NA SUSP: 2.0000 | Freq: Every day | NASAL | Status: DC

## 2015-02-08 NOTE — Telephone Encounter (Signed)
Patient is requesting refill of fluticasone (FLONASE) 50 MCG/ACT nasal spray [[id number]][[id number]] [facility]

## 2015-02-08 NOTE — Telephone Encounter (Signed)
Done

## 2015-02-15 ENCOUNTER — Encounter: Payer: Self-pay | Admitting: Internal Medicine

## 2015-07-11 ENCOUNTER — Encounter: Payer: Self-pay | Admitting: Gastroenterology

## 2015-08-21 ENCOUNTER — Encounter: Payer: Self-pay | Admitting: Emergency Medicine

## 2015-08-21 ENCOUNTER — Encounter: Payer: Self-pay | Admitting: Internal Medicine

## 2015-08-21 ENCOUNTER — Ambulatory Visit (INDEPENDENT_AMBULATORY_CARE_PROVIDER_SITE_OTHER): Payer: Managed Care, Other (non HMO) | Admitting: Internal Medicine

## 2015-08-21 VITALS — BP 120/68 | HR 107 | Temp 98.9°F | Resp 18 | Wt 231.0 lb

## 2015-08-21 DIAGNOSIS — J309 Allergic rhinitis, unspecified: Secondary | ICD-10-CM

## 2015-08-21 MED ORDER — AMOXICILLIN 500 MG PO CAPS: 500.0000 mg | ORAL_CAPSULE | Freq: Three times a day (TID) | ORAL | Status: DC

## 2015-08-21 MED ORDER — PREDNISONE 10 MG PO TABS: ORAL_TABLET | ORAL | Status: DC

## 2015-08-21 NOTE — Patient Instructions (Signed)
Plain Mucinex (NOT D) for thick secretions ;force NON dairy fluids .   Nasal cleansing in the shower as discussed with lather of mild shampoo.After 10 seconds wash off lather while  exhaling through nostrils. Make sure that all residual soap is removed to prevent irritation.  Flonase OR Nasacort AQ 1 spray in each nostril twice a day as needed. Use the "crossover" technique into opposite nostril spraying toward opposite ear @ 45 degree angle, not straight up into nostril.  Plain Allegra (NOT D )  160 daily , Loratidine 10 mg , OR Zyrtec 10 mg @ bedtime  as needed for itchy eyes & sneezing  Fill the  prescription for antibiotic if fever, discolored nasal or chest secretions or significant pain above & below eyes appear in the next 48-72 hours. Fill the  prescription for Prednisone if cough or wheezing not better in the next 48 hours with new medications.  To use Breo: Pull cap down to release medication. Blow out as much as possible then inhale powder as deeply as possible. Hold breath to count of ten then exhale.  Gargle and spit after use. Lot #: [id number] Expiration date: 2/18

## 2015-08-21 NOTE — Progress Notes (Signed)
Pre visit review using our clinic review tool, if applicable. No additional management support is needed unless otherwise documented below in the visit note. 

## 2015-08-21 NOTE — Progress Notes (Signed)
   Subjective:    Patient ID: [name], male    DOB: [birth year], 64 y.o.   MRN: [id number]  HPI   He was working in his yard 08/19/15 with  some dust exposure. He subsequently developed sore throat and rhinorrhea with clear secretions. Cough has been nonproductive. He does have some maxillary sinus pressure as well as watery eyes. He's noticed some chest discomfort on the left with the cough. He also has noted some wheezing. He does have some pressure in his ears.  He does have a history of allergic rhinitis as well as extrinsic asthma. He is a former smoker.  Also that same-day he spilled diluted Roundup on his back. He rinsed it off an hour later. He's had no associated rash, itching, or skin lesions following this.  His main concern is he will be visiting his father in law who is terminally ill and does not want to expose him to any acute illness  Review of Systems   He denies significant frontal or maxillary sinus pain or nasal purulence. He has no itchy eyes. He also denies fever, chills, or sweats. There's been no purulent sputum. He has no shortness of breath.     Objective:   Physical Exam   General appearance:Adequately nourished; no acute distress or increased work of breathing is present.    Lymphatic: No  lymphadenopathy about the head, neck, or axilla .  Eyes: No conjunctival inflammation or lid edema is present. There is no scleral icterus.  Ears:  External ear exam shows no significant lesions or deformities.  Otoscopic examination reveals clear canals, tympanic membranes are intact bilaterally without bulging, retraction, inflammation or discharge.  Nose:  External nasal examination shows no deformity or inflammation.  He has bilateral rhinitis with boggy erythematous nares.  Oral exam: Dental hygiene is good; lips and gums are healthy appearing.There is no oropharyngeal erythema or exudate .  Neck:  No deformities, thyromegaly, masses, or tenderness noted.    Supple with full range of motion without pain.   Heart:   He exhibits a resting S4 gallop with pulse rate of 104. The rhytym is regular. S1 and S2 normal without  murmur, click, rub or other extra sounds.   Lungs:Chest clear to auscultation; no wheezes, rhonchi,rales ,or rubs present.  Extremities:  No cyanosis, edema, or clubbing  noted    Skin: Warm & dry w/o tenting or jaundice. No significant lesions or rash.       Assessment & Plan:  #1 extrinsic rhinitis without rhinosinusitis  #2 topical weed killer exposure without clinical evidence of sequela  Plan: See orders and recommendations

## 2015-11-22 ENCOUNTER — Ambulatory Visit (INDEPENDENT_AMBULATORY_CARE_PROVIDER_SITE_OTHER): Payer: Managed Care, Other (non HMO) | Admitting: Internal Medicine

## 2015-11-22 ENCOUNTER — Encounter: Payer: Self-pay | Admitting: Internal Medicine

## 2015-11-22 VITALS — BP 146/102 | HR 68 | Temp 98.0°F | Ht 72.0 in | Wt 238.0 lb

## 2015-11-22 DIAGNOSIS — Z0189 Encounter for other specified special examinations: Secondary | ICD-10-CM | POA: Diagnosis not present

## 2015-11-22 DIAGNOSIS — E785 Hyperlipidemia, unspecified: Secondary | ICD-10-CM

## 2015-11-22 DIAGNOSIS — Z0000 Encounter for general adult medical examination without abnormal findings: Secondary | ICD-10-CM

## 2015-11-22 DIAGNOSIS — R609 Edema, unspecified: Secondary | ICD-10-CM

## 2015-11-22 DIAGNOSIS — R7302 Impaired glucose tolerance (oral): Secondary | ICD-10-CM | POA: Diagnosis not present

## 2015-11-22 DIAGNOSIS — R030 Elevated blood-pressure reading, without diagnosis of hypertension: Secondary | ICD-10-CM

## 2015-11-22 NOTE — Progress Notes (Signed)
Subjective:    Patient ID: [name], male    DOB: 01/03/1951, 64 y.o.   MRN: [id number]  HPI  Here to f/u, has seen ortho approx 2 wks ago, and advised to f/u here due to finding of peripheral edema, pt states was unaware at the time, had not noticed wt change, and Pt denies chest pain, increased sob or doe, wheezing, orthopnea, PND, increased LE swelling, palpitations, dizziness or syncope.  Pt denies new neurological symptoms such as new headache, or facial or extremity weakness or numbness   Pt denies polydipsia, polyuria, BP Readings from Last 3 Encounters:  11/22/15 146/102  08/21/15 120/68  01/17/15 160/100   Wt Readings from Last 3 Encounters:  11/22/15 238 lb (107.956 kg)  08/21/15 231 lb (104.781 kg)  01/17/15 237 lb (107.502 kg)   Past Medical History  Diagnosis Date  . ALLERGIC RHINITIS 12/31/2007  . DEGENERATIVE JOINT DISEASE 07/18/2007  . Extrinsic asthma, unspecified 09/08/2009  . GERD 07/18/2007  . HYPERLIPIDEMIA 08/14/2010  . Impaired glucose tolerance 09/23/2011  . COUGH, CHRONIC 07/18/2007  . OBESITY 07/18/2007   No past surgical history on file.  reports that he has quit smoking. He does not have any smokeless tobacco history on file. He reports that he does not drink alcohol. His drug history is not on file. family history is not on file. No Known Allergies Current Outpatient Prescriptions on File Prior to Visit  Medication Sig Dispense Refill  . acetaminophen (TYLENOL) 500 MG tablet Take 1,000 mg by mouth every 6 (six) hours as needed. For pain    . esomeprazole (NEXIUM) 40 MG capsule Take 1 capsule (40 mg total) by mouth daily before breakfast. 90 capsule 3  . gabapentin (NEURONTIN) 300 MG capsule Take 300 mg by mouth 2 (two) times daily.    . naproxen (NAPROSYN) 500 MG tablet Take 500 mg by mouth 2 (two) times daily as needed. For pain    . tadalafil (CIALIS) 20 MG tablet Take 1 tablet (20 mg total) by mouth daily as needed for erectile dysfunction. 10  tablet 11  . amoxicillin (AMOXIL) 500 MG capsule Take 1 capsule (500 mg total) by mouth 3 (three) times daily. (Patient not taking: Reported on 11/22/2015) 30 capsule 0  . fluticasone (FLONASE) 50 MCG/ACT nasal spray Place 2 sprays into both nostrils daily. (Patient not taking: Reported on 11/22/2015) 16 g 2  . predniSONE (DELTASONE) 10 MG tablet 1 tid pc (Patient not taking: Reported on 11/22/2015) 21 tablet 0   No current facility-administered medications on file prior to visit.   Review of Systems  Constitutional: Negative for unusual diaphoresis or night sweats HENT: Negative for ringing in ear or discharge Eyes: Negative for double vision or worsening visual disturbance.  Respiratory: Negative for choking and stridor.   Gastrointestinal: Negative for vomiting or other signifcant bowel change Genitourinary: Negative for hematuria or change in urine volume.  Musculoskeletal: Negative for other MSK pain or swelling Skin: Negative for color change and worsening wound.  Neurological: Negative for tremors and numbness other than noted  Psychiatric/Behavioral: Negative for decreased concentration or agitation other than above       Objective:   Physical Exam BP 146/102 mmHg  Pulse 68  Temp(Src) 98 F (36.7 C) (Oral)  Ht 6' (1.829 m)  Wt 238 lb (107.956 kg)  BMI 32.27 kg/m2  SpO2 97% BP 146/102 mmHg  Pulse 68  Temp(Src) 98 F (36.7 C) (Oral)  Ht 6' (1.829 m)  Wt 238  lb (107.956 kg)  BMI 32.27 kg/m2  SpO2 97% VS noted,  Constitutional: Pt appears in no significant distress HENT: Head: NCAT.  Right Ear: External ear normal.  Left Ear: External ear normal.  Eyes: . Pupils are equal, round, and reactive to light. Conjunctivae and EOM are normal Neck: Normal range of motion. Neck supple.  Cardiovascular: Normal rate and regular rhythm.   Pulmonary/Chest: Effort normal and breath sounds without rales or wheezing.  Abd:  Soft, NT, ND, + BS Neurological: Pt is alert. Not confused  , motor grossly intact Skin: Skin is warm. No rash, no LE edema, has mult varicosities,  Left calf more muscular volume compared to right (pt is left handed) Dorsalis pedis 1+ bilat Psychiatric: Pt behavior is normal. No agitation.     Assessment & Plan:

## 2015-11-22 NOTE — Patient Instructions (Signed)
Please continue to monitor your Blood Pressure at home as you can, as we are looking for an average BP less than 140/90  Please continue all other medications as before, and refills have been done if requested.  Please have the pharmacy call with any other refills you may need.  Please continue your efforts at being more active, low cholesterol diet, and weight control  Please keep your appointments with your specialists as you may have planned  Please return in 3 months, or sooner if needed, with Lab testing done 3-5 days before

## 2015-11-22 NOTE — Progress Notes (Signed)
Pre visit review using our clinic review tool, if applicable. No additional management support is needed unless otherwise documented below in the visit note. 

## 2015-11-24 DIAGNOSIS — R030 Elevated blood-pressure reading, without diagnosis of hypertension: Secondary | ICD-10-CM | POA: Insufficient documentation

## 2015-11-24 NOTE — Assessment & Plan Note (Signed)
Suspect pt likely to have sustained essential HTN, but he believes situational, for BP further checks at home and next visit BP Readings from Last 3 Encounters:  11/22/15 146/102  08/21/15 120/68  01/17/15 160/100

## 2015-11-24 NOTE — Assessment & Plan Note (Signed)
No LE edema noted today, ? Possible some venous insufficiency type edema, but unclear etiology for now, ok to follow, no change in tx for this at this time

## 2015-11-24 NOTE — Assessment & Plan Note (Signed)
stable overall by history and exam, recent data reviewed with pt, and pt to continue medical treatment as before,  to f/u any worsening symptoms or concerns Lab Results  Component Value Date   HGBA1C 5.8 01/17/2015

## 2015-11-24 NOTE — Assessment & Plan Note (Signed)
D/w pt, trying to follow lower chol diet, stable overall by history and exam, recent data reviewed with pt, and pt to continue medical treatment as before,  to f/u any worsening symptoms or concerns Lab Results  Component Value Date   LDLCALC 113* 01/17/2015

## 2015-12-13 ENCOUNTER — Other Ambulatory Visit: Payer: Self-pay | Admitting: *Deleted

## 2015-12-13 MED ORDER — ESOMEPRAZOLE MAGNESIUM 40 MG PO CPDR: 40.0000 mg | DELAYED_RELEASE_CAPSULE | Freq: Every day | ORAL | Status: DC

## 2015-12-13 NOTE — Telephone Encounter (Signed)
Pt stated he fail to get a new rx on his nexium when he was saw md back in Dec. Needing rx [facility]. Sent electronically....Raechel Chute/lmb

## 2016-02-27 ENCOUNTER — Encounter (INDEPENDENT_AMBULATORY_CARE_PROVIDER_SITE_OTHER): Payer: Self-pay

## 2016-05-19 ENCOUNTER — Other Ambulatory Visit (HOSPITAL_COMMUNITY): Payer: Self-pay | Admitting: Physical Medicine and Rehabilitation

## 2016-05-19 DIAGNOSIS — M545 Low back pain: Secondary | ICD-10-CM

## 2016-05-19 DIAGNOSIS — M48061 Spinal stenosis, lumbar region without neurogenic claudication: Secondary | ICD-10-CM

## 2016-05-26 ENCOUNTER — Ambulatory Visit (HOSPITAL_COMMUNITY)
Admission: RE | Admit: 2016-05-26 | Discharge: 2016-05-26 | Disposition: A | Discharge status: Home / Self Care | Payer: Managed Care, Other (non HMO) | Source: Ambulatory Visit | Attending: Physical Medicine and Rehabilitation | Admitting: Physical Medicine and Rehabilitation

## 2016-05-26 DIAGNOSIS — M5136 Other intervertebral disc degeneration, lumbar region: Secondary | ICD-10-CM | POA: Insufficient documentation

## 2016-05-26 DIAGNOSIS — M4806 Spinal stenosis, lumbar region: Secondary | ICD-10-CM | POA: Insufficient documentation

## 2016-05-26 DIAGNOSIS — M545 Low back pain: Secondary | ICD-10-CM

## 2016-05-26 DIAGNOSIS — M48061 Spinal stenosis, lumbar region without neurogenic claudication: Secondary | ICD-10-CM

## 2016-05-26 DIAGNOSIS — M5126 Other intervertebral disc displacement, lumbar region: Secondary | ICD-10-CM | POA: Insufficient documentation

## 2016-10-20 ENCOUNTER — Ambulatory Visit (INDEPENDENT_AMBULATORY_CARE_PROVIDER_SITE_OTHER): Payer: Managed Care, Other (non HMO) | Admitting: Nurse Practitioner

## 2016-10-20 ENCOUNTER — Encounter: Payer: Self-pay | Admitting: Nurse Practitioner

## 2016-10-20 VITALS — BP 138/94 | HR 82 | Temp 98.2°F | Ht 72.0 in | Wt 232.0 lb

## 2016-10-20 DIAGNOSIS — J209 Acute bronchitis, unspecified: Secondary | ICD-10-CM

## 2016-10-20 MED ORDER — ALBUTEROL SULFATE HFA 108 (90 BASE) MCG/ACT IN AERS: 2.0000 | INHALATION_SPRAY | Freq: Four times a day (QID) | RESPIRATORY_TRACT | 0 refills | Status: DC | PRN

## 2016-10-20 MED ORDER — CEFUROXIME AXETIL 500 MG PO TABS: 500.0000 mg | ORAL_TABLET | Freq: Two times a day (BID) | ORAL | 0 refills | Status: DC

## 2016-10-20 MED ORDER — DM-GUAIFENESIN ER 30-600 MG PO TB12: 1.0000 | ORAL_TABLET | Freq: Two times a day (BID) | ORAL | 0 refills | Status: DC | PRN

## 2016-10-20 MED ORDER — BENZONATATE 100 MG PO CAPS: 100.0000 mg | ORAL_CAPSULE | Freq: Three times a day (TID) | ORAL | 0 refills | Status: DC | PRN

## 2016-10-20 MED ORDER — ALBUTEROL SULFATE (2.5 MG/3ML) 0.083% IN NEBU
2.5000 mg | INHALATION_SOLUTION | Freq: Once | RESPIRATORY_TRACT | Status: AC
  Administered 2016-10-20: 2.5 mg via RESPIRATORY_TRACT

## 2016-10-20 MED ORDER — SACCHAROMYCES BOULARDII 250 MG PO CAPS: 250.0000 mg | ORAL_CAPSULE | Freq: Two times a day (BID) | ORAL | 0 refills | Status: DC

## 2016-10-20 MED ORDER — SALINE SPRAY 0.65 % NA SOLN: 1.0000 | NASAL | 0 refills | Status: DC | PRN

## 2016-10-20 NOTE — Progress Notes (Signed)
Pre visit review using our clinic review tool, if applicable. No additional management support is needed unless otherwise documented below in the visit note. 

## 2016-10-20 NOTE — Progress Notes (Signed)
Subjective:  Patient ID: Joseph FarberRobert M [name], male    DOB: [birth year]  Age: 65 y.o. MRN: [id number]  CC: Cough (pt stated having cough,chest tightness,tired for 1 week)   Cough  This is a new problem. The current episode started in the past 7 days. The problem has been gradually worsening. The problem occurs constantly. The cough is productive of sputum. Associated symptoms include nasal congestion, postnasal drip, rhinorrhea, a sore throat, shortness of breath and wheezing. Pertinent negatives include no fever. The symptoms are aggravated by lying down. Risk factors for lung disease include smoking/tobacco exposure. He has tried OTC cough suppressant for the symptoms. The treatment provided no relief.    Outpatient Medications Prior to Visit  Medication Sig Dispense Refill  . acetaminophen (TYLENOL) 500 MG tablet Take 1,000 mg by mouth every 6 (six) hours as needed. For pain    . esomeprazole (NEXIUM) 40 MG capsule Take 1 capsule (40 mg total) by mouth daily before breakfast. 90 capsule 3  . gabapentin (NEURONTIN) 300 MG capsule Take 300 mg by mouth 2 (two) times daily.    . naproxen (NAPROSYN) 500 MG tablet Take 500 mg by mouth 2 (two) times daily as needed. For pain    . tadalafil (CIALIS) 20 MG tablet Take 1 tablet (20 mg total) by mouth daily as needed for erectile dysfunction. 10 tablet 11  . amoxicillin (AMOXIL) 500 MG capsule Take 1 capsule (500 mg total) by mouth 3 (three) times daily. (Patient not taking: Reported on 11/22/2015) 30 capsule 0  . fluticasone (FLONASE) 50 MCG/ACT nasal spray Place 2 sprays into both nostrils daily. (Patient not taking: Reported on 11/22/2015) 16 g 2  . predniSONE (DELTASONE) 10 MG tablet 1 tid pc (Patient not taking: Reported on 11/22/2015) 21 tablet 0   No facility-administered medications prior to visit.     ROS See HPI  Objective:  BP (!) 138/94 (BP Location: Left Arm, Patient Position: Sitting, Cuff Size: Normal)   Pulse 82   Temp 98.2 F (36.8  C)   Ht 6' (1.829 m)   Wt 232 lb (105.2 kg)   SpO2 95%   BMI 31.46 kg/m   BP Readings from Last 3 Encounters:  10/20/16 (!) 138/94  11/22/15 (!) 146/102  08/21/15 120/68    Wt Readings from Last 3 Encounters:  10/20/16 232 lb (105.2 kg)  05/26/16 230 lb (104.3 kg)  11/22/15 238 lb (108 kg)    Physical Exam  Constitutional: He is oriented to person, place, and time. No distress.  HENT:  Right Ear: Tympanic membrane, external ear and ear canal normal.  Left Ear: Tympanic membrane, external ear and ear canal normal.  Nose: Mucosal edema present. No rhinorrhea. Right sinus exhibits no maxillary sinus tenderness and no frontal sinus tenderness. Left sinus exhibits no maxillary sinus tenderness and no frontal sinus tenderness.  Mouth/Throat: Posterior oropharyngeal erythema present. No oropharyngeal exudate.  Eyes: No scleral icterus.  Neck: Normal range of motion. Neck supple.  Cardiovascular: Normal rate and normal heart sounds.   Pulmonary/Chest: Effort normal. He has wheezes.  Musculoskeletal: He exhibits no edema.  Lymphadenopathy:    He has no cervical adenopathy.  Neurological: He is alert and oriented to person, place, and time.  Skin: Skin is warm and dry.  Vitals reviewed.   Lab Results  Component Value Date   WBC 5.8 01/17/2015   HGB 14.7 01/17/2015   HCT 44.5 01/17/2015   PLT 200.0 01/17/2015   GLUCOSE 80 01/17/2015  CHOL 180 01/17/2015   TRIG 104.0 01/17/2015   HDL 46.20 01/17/2015   LDLDIRECT 132.9 09/23/2011   LDLCALC 113 (H) 01/17/2015   ALT 30 01/17/2015   AST 21 01/17/2015   NA 142 01/17/2015   K 4.8 01/17/2015   CL 108 01/17/2015   CREATININE 1.02 01/17/2015   BUN 18 01/17/2015   CO2 27 01/17/2015   TSH 0.83 01/17/2015   PSA 0.76 01/17/2015   HGBA1C 5.8 01/17/2015    Mr Lumbar Spine Wo Contrast  Result Date: 05/26/2016 CLINICAL DATA:  Larey SeatFell from tree in 2004 with back injury and subsequent surgery. Low back pain and severe right leg pain  recently. EXAM: MRI LUMBAR SPINE WITHOUT CONTRAST TECHNIQUE: Multiplanar, multisequence MR imaging of the lumbar spine was performed. No intravenous contrast was administered. COMPARISON:  03/17/2011 FINDINGS: Segmentation:  5 lumbar type vertebral bodies. Alignment: No curvature. 4 mm anterolisthesis at L4-5. 2 mm anterolisthesis at L2-3. Vertebrae: No focal bone lesion. Degenerative endplate changes at L5-S1. Conus medullaris: Extends to the L1 level and appears normal. Paraspinal and other soft tissues: No significant finding. Disc levels: T12-L1:  Normal. L1-2: Desiccation and bulging of the disc with a shallow left posterior lateral disc herniation that indents the thecal sac. Narrowing of the left lateral recess could cause neural compression. This has worsened since the previous study. L2-3: Bilateral facet degeneration and hypertrophy. 2 mm of anterolisthesis. Mild bulging of the disc. Very shallow left-sided protrusion superimposed. Mild narrowing of the lateral recesses without visible neural compression. Similar when compared to the previous study. L3-4: Mild bulging of the disc. Mild facet and ligamentous hypertrophy. Mild lateral recess narrowing without visible neural compression. L4-5: Advanced bilateral facet arthropathy with 4 mm of anterolisthesis. Circumferential bulging of the disc. Stenosis of both lateral recesses that could cause neural compression on either or both sides. Moderate foraminal narrowing right more than left. Facet edema could be associated with pain. Spinous process abutment could be associated with pain. Findings have worsened considerably since the previous study at this level. L5-S1: Chronic disc degeneration with desiccation and loss of height. Chronic endplate cystic changes. Mild bulging of the disc. Mild facet degeneration. Mild narrowing of the subarticular lateral recesses without visible neural compression. Mild foraminal narrowing without visible neural compression.  Degenerative changes at this level have worsened somewhat since previous study. IMPRESSION: The dominant finding is worsening of degenerative changes at the L4-5 level. There is advanced facet arthropathy with hypertrophic degenerative change allowing anterolisthesis of 4 mm. Facet joints are edematous and could be painful. The disc bulges circumferentially. There is spinal stenosis that could cause neural compression in either or both lateral recesses or neural foramina. This appearance could worsen with standing or flexion. L2-3 facet arthropathy with 2 mm of anterolisthesis. Bulging of the disc and a shallow left posterior lateral disc protrusion. Mild stenosis of both lateral recesses. L1-2 shallow disc herniation in the left posterior lateral direction with narrowing of left lateral recess that could be symptomatic. This has worsened since the previous study. Worsened disc degeneration at L5-S1 with endplate changes. Mild facet degeneration. No visible neural compression at this level, but the findings could contribute to low back pain. Electronically Signed   By: Paulina FusiMark  Shogry M.D.   On: 05/26/2016 19:26    Assessment & Plan:   Molly MaduroRobert was seen today for cough.  Diagnoses and all orders for this visit:  Acute bronchitis, unspecified organism -     albuterol (PROVENTIL) (2.5 MG/3ML) 0.083% nebulizer solution  2.5 mg; Take 3 mLs (2.5 mg total) by nebulization once. -     Discontinue: benzonatate (TESSALON) 100 MG capsule; Take 1 capsule (100 mg total) by mouth 3 (three) times daily as needed for cough. -     Discontinue: albuterol (PROVENTIL HFA;VENTOLIN HFA) 108 (90 Base) MCG/ACT inhaler; Inhale 2 puffs into the lungs every 6 (six) hours as needed for wheezing or shortness of breath. -     dextromethorphan-guaiFENesin (MUCINEX DM) 30-600 MG 12hr tablet; Take 1 tablet by mouth 2 (two) times daily as needed for cough. -     Discontinue: cefUROXime (CEFTIN) 500 MG tablet; Take 1 tablet (500 mg total) by  mouth 2 (two) times daily with a meal. -     sodium chloride (OCEAN) 0.65 % SOLN nasal spray; Place 1 spray into both nostrils as needed for congestion. -     saccharomyces boulardii (FLORASTOR) 250 MG capsule; Take 1 capsule (250 mg total) by mouth 2 (two) times daily. -     benzonatate (TESSALON) 100 MG capsule; Take 1 capsule (100 mg total) by mouth 3 (three) times daily as needed for cough. -     albuterol (PROVENTIL HFA;VENTOLIN HFA) 108 (90 Base) MCG/ACT inhaler; Inhale 2 puffs into the lungs every 6 (six) hours as needed for wheezing or shortness of breath. -     cefUROXime (CEFTIN) 500 MG tablet; Take 1 tablet (500 mg total) by mouth 2 (two) times daily with a meal.   I have discontinued Mr. Ra's fluticasone, amoxicillin, and predniSONE. I am also having him start on dextromethorphan-guaiFENesin, sodium chloride, and saccharomyces boulardii. Additionally, I am having him maintain his naproxen, gabapentin, acetaminophen, tadalafil, esomeprazole, Hydrocodone-Acetaminophen, benzonatate, albuterol, and cefUROXime. We administered albuterol.  Meds ordered this encounter  Medications  . Hydrocodone-Acetaminophen 5-300 MG TABS    Sig: Take by mouth.  Marland Kitchen albuterol (PROVENTIL) (2.5 MG/3ML) 0.083% nebulizer solution 2.5 mg  . DISCONTD: benzonatate (TESSALON) 100 MG capsule    Sig: Take 1 capsule (100 mg total) by mouth 3 (three) times daily as needed for cough.    Dispense:  20 capsule    Refill:  0    Order Specific Question:   Supervising Provider    Answer:   Tresa Garter [[id number]]  . DISCONTD: albuterol (PROVENTIL HFA;VENTOLIN HFA) 108 (90 Base) MCG/ACT inhaler    Sig: Inhale 2 puffs into the lungs every 6 (six) hours as needed for wheezing or shortness of breath.    Dispense:  1 Inhaler    Refill:  0    Order Specific Question:   Supervising Provider    Answer:   Tresa Garter [[id number]]  . dextromethorphan-guaiFENesin (MUCINEX DM) 30-600 MG 12hr tablet    Sig: Take 1 tablet  by mouth 2 (two) times daily as needed for cough.    Dispense:  14 tablet    Refill:  0    Order Specific Question:   Supervising Provider    Answer:   Tresa Garter [[id number]]  . DISCONTD: cefUROXime (CEFTIN) 500 MG tablet    Sig: Take 1 tablet (500 mg total) by mouth 2 (two) times daily with a meal.    Dispense:  14 tablet    Refill:  0    Order Specific Question:   Supervising Provider    Answer:   Tresa Garter [[id number]]  . sodium chloride (OCEAN) 0.65 % SOLN nasal spray    Sig: Place 1 spray into both nostrils as needed for congestion.  Dispense:  15 mL    Refill:  0    Order Specific Question:   Supervising Provider    Answer:   Tresa Garter [[id number]]  . saccharomyces boulardii (FLORASTOR) 250 MG capsule    Sig: Take 1 capsule (250 mg total) by mouth 2 (two) times daily.    Dispense:  14 capsule    Refill:  0    Order Specific Question:   Supervising Provider    Answer:   Tresa Garter [[id number]]  . benzonatate (TESSALON) 100 MG capsule    Sig: Take 1 capsule (100 mg total) by mouth 3 (three) times daily as needed for cough.    Dispense:  20 capsule    Refill:  0  . albuterol (PROVENTIL HFA;VENTOLIN HFA) 108 (90 Base) MCG/ACT inhaler    Sig: Inhale 2 puffs into the lungs every 6 (six) hours as needed for wheezing or shortness of breath.    Dispense:  1 Inhaler    Refill:  0  . cefUROXime (CEFTIN) 500 MG tablet    Sig: Take 1 tablet (500 mg total) by mouth 2 (two) times daily with a meal.    Dispense:  14 tablet    Refill:  0    Follow-up: Return if symptoms worsen or fail to improve.  Alysia Penna, NP

## 2016-10-20 NOTE — Patient Instructions (Signed)
URI Instructions: Flonase and Afrin use: apply 1spray of afrin in each nare, wait 5mins, then apply 2sprays of flonase in each nare. Use both nasal spray consecutively x 3days, then flonase only for at least 14days.  Use over-the-counter  "cold" medicines  such as "Tylenol cold" , "Advil cold",  "Mucinex" or" Mucinex D"  for cough and congestion.  Avoid decongestants if you have high blood pressure. Use" Delsym" or" Robitussin" cough syrup varietis for cough.  You can use plain "Tylenol" or "Advi"l for fever, chills and achyness.   "Common cold" symptoms are usually triggered by a virus.  The antibiotics are usually not necessary. On average, a" viral cold" illness would take 4-7 days to resolve. Please, make an appointment if you are not better or if you're worse.  

## 2016-10-30 ENCOUNTER — Ambulatory Visit (INDEPENDENT_AMBULATORY_CARE_PROVIDER_SITE_OTHER): Payer: Managed Care, Other (non HMO) | Admitting: Internal Medicine

## 2016-10-30 ENCOUNTER — Encounter: Payer: Self-pay | Admitting: Internal Medicine

## 2016-10-30 VITALS — BP 138/80 | HR 80 | Temp 98.8°F | Resp 20 | Wt 230.0 lb

## 2016-10-30 DIAGNOSIS — J45901 Unspecified asthma with (acute) exacerbation: Secondary | ICD-10-CM | POA: Insufficient documentation

## 2016-10-30 DIAGNOSIS — R05 Cough: Secondary | ICD-10-CM | POA: Diagnosis not present

## 2016-10-30 DIAGNOSIS — I10 Essential (primary) hypertension: Secondary | ICD-10-CM | POA: Insufficient documentation

## 2016-10-30 DIAGNOSIS — R7302 Impaired glucose tolerance (oral): Secondary | ICD-10-CM

## 2016-10-30 DIAGNOSIS — R059 Cough, unspecified: Secondary | ICD-10-CM | POA: Insufficient documentation

## 2016-10-30 DIAGNOSIS — Z0001 Encounter for general adult medical examination with abnormal findings: Secondary | ICD-10-CM | POA: Diagnosis not present

## 2016-10-30 DIAGNOSIS — J4521 Mild intermittent asthma with (acute) exacerbation: Secondary | ICD-10-CM

## 2016-10-30 MED ORDER — LEVOFLOXACIN 500 MG PO TABS: 500.0000 mg | ORAL_TABLET | Freq: Every day | ORAL | 0 refills | Status: AC

## 2016-10-30 MED ORDER — LOSARTAN POTASSIUM 50 MG PO TABS: 50.0000 mg | ORAL_TABLET | Freq: Every day | ORAL | 3 refills | Status: DC

## 2016-10-30 MED ORDER — HYDROCODONE-HOMATROPINE 5-1.5 MG/5ML PO SYRP: 5.0000 mL | ORAL_SOLUTION | Freq: Four times a day (QID) | ORAL | 0 refills | Status: AC | PRN

## 2016-10-30 MED ORDER — PREDNISONE 10 MG PO TABS: ORAL_TABLET | ORAL | 0 refills | Status: DC

## 2016-10-30 NOTE — Assessment & Plan Note (Signed)
New onset mild, for losartan 50 qd,  to f/u any worsening symptoms or concerns

## 2016-10-30 NOTE — Assessment & Plan Note (Signed)
Mild to mod, c/w bornchitis vs pna, declines cxr, for antibx course, cough med prn,  to f/u any worsening symptoms or concerns °

## 2016-10-30 NOTE — Progress Notes (Signed)
Subjective:    Patient ID: Joseph FarberRobert M [name], male    DOB: 11-06-1951, 65 y.o.   MRN: [id number]  HPI   Here with acute onset mild to mod 2-3 days ST, HA, general weakness and malaise, with prod cough greenish sputum, but Pt denies chest pain, increased sob or doe, orthopnea, PND, increased LE swelling, palpitations, dizziness or syncope, except with onset mild wheezing yestesrday with increased inhaler use.  Even before this acute illness, BP has been elevated mild several times at home in last few wks.   Pt denies polydipsia, polyuria,  Past Medical History:  Diagnosis Date  . ALLERGIC RHINITIS 12/31/2007  . COUGH, CHRONIC 07/18/2007  . DEGENERATIVE JOINT DISEASE 07/18/2007  . Extrinsic asthma, unspecified 09/08/2009  . GERD 07/18/2007  . HYPERLIPIDEMIA 08/14/2010  . Impaired glucose tolerance 09/23/2011  . OBESITY 07/18/2007   No past surgical history on file.  reports that he has quit smoking. He does not have any smokeless tobacco history on file. He reports that he does not drink alcohol. His drug history is not on file. family history is not on file. No Known Allergies Current Outpatient Prescriptions on File Prior to Visit  Medication Sig Dispense Refill  . acetaminophen (TYLENOL) 500 MG tablet Take 1,000 mg by mouth every 6 (six) hours as needed. For pain    . albuterol (PROVENTIL HFA;VENTOLIN HFA) 108 (90 Base) MCG/ACT inhaler Inhale 2 puffs into the lungs every 6 (six) hours as needed for wheezing or shortness of breath. 1 Inhaler 0  . benzonatate (TESSALON) 100 MG capsule Take 1 capsule (100 mg total) by mouth 3 (three) times daily as needed for cough. 20 capsule 0  . dextromethorphan-guaiFENesin (MUCINEX DM) 30-600 MG 12hr tablet Take 1 tablet by mouth 2 (two) times daily as needed for cough. 14 tablet 0  . esomeprazole (NEXIUM) 40 MG capsule Take 1 capsule (40 mg total) by mouth daily before breakfast. 90 capsule 3  . gabapentin (NEURONTIN) 300 MG capsule Take 300 mg by mouth 2  (two) times daily.    . Hydrocodone-Acetaminophen 5-300 MG TABS Take by mouth.    . naproxen (NAPROSYN) 500 MG tablet Take 500 mg by mouth 2 (two) times daily as needed. For pain    . saccharomyces boulardii (FLORASTOR) 250 MG capsule Take 1 capsule (250 mg total) by mouth 2 (two) times daily. 14 capsule 0  . sodium chloride (OCEAN) 0.65 % SOLN nasal spray Place 1 spray into both nostrils as needed for congestion. 15 mL 0  . tadalafil (CIALIS) 20 MG tablet Take 1 tablet (20 mg total) by mouth daily as needed for erectile dysfunction. 10 tablet 11   No current facility-administered medications on file prior to visit.    Review of Systems  Constitutional: Negative for unusual diaphoresis or night sweats HENT: Negative for ear swelling or discharge Eyes: Negative for worsening visual haziness  Respiratory: Negative for choking and stridor.   Gastrointestinal: Negative for distension or worsening eructation Genitourinary: Negative for retention or change in urine volume.  Musculoskeletal: Negative for other MSK pain or swelling Skin: Negative for color change and worsening wound Neurological: Negative for tremors and numbness other than noted  Psychiatric/Behavioral: Negative for decreased concentration or agitation other than above   All other system neg per pt    Objective:   Physical Exam BP 138/80   Pulse 80   Temp 98.8 F (37.1 C) (Oral)   Resp 20   Wt 230 lb (104.3 kg)  SpO2 92%   BMI 31.19 kg/m  VS noted, f/u BP 142/100 Constitutional: Pt appears in no apparent distress HENT: Head: NCAT.  Right Ear: External ear normal.  Left Ear: External ear normal.  Eyes: . Pupils are equal, round, and reactive to light. Conjunctivae and EOM are normal Neck: Normal range of motion. Neck supple.  Cardiovascular: Normal rate and regular rhythm.   Pulmonary/Chest: Effort normal and breath sounds decreased without rales but with mild diffuse wheezing.  Neurological: Pt is alert. Not  confused , motor grossly intact Skin: Skin is warm. No rash, no LE edema Psychiatric: Pt behavior is normal. No agitation.  No other new exam findings    Assessment & Plan:

## 2016-10-30 NOTE — Assessment & Plan Note (Signed)
Mild to mod, for depomedrol IM 80, predpac asd, to f/u any worsening symptoms or concerns 

## 2016-10-30 NOTE — Progress Notes (Signed)
Pre visit review using our clinic review tool, if applicable. No additional management support is needed unless otherwise documented below in the visit note. 

## 2016-10-30 NOTE — Patient Instructions (Addendum)
You had the steroid shot today  Please take all new medication as prescribed - the antibiotic, cough medicine if needed, prednisone, as well as the losartan 50 mg per day for blood pressure  Please continue all other medications as before, and refills have been done if requested.  Please have the pharmacy call with any other refills you may need.  Please continue your efforts at being more active, low cholesterol diet, and weight control.  Please keep your appointments with your specialists as you may have planned  Please return in 3 months, or sooner if needed, with Lab testing done 3-5 days before

## 2016-10-30 NOTE — Assessment & Plan Note (Signed)
stable overall by history and exam, recent data reviewed with pt, and pt to continue medical treatment as before,  to f/u any worsening symptoms or concerns Lab Results  Component Value Date   HGBA1C 5.8 01/17/2015    

## 2017-01-20 ENCOUNTER — Other Ambulatory Visit (INDEPENDENT_AMBULATORY_CARE_PROVIDER_SITE_OTHER): Payer: Self-pay | Admitting: Specialist

## 2017-01-21 NOTE — Telephone Encounter (Signed)
Naproxen Refill

## 2017-01-30 ENCOUNTER — Telehealth (INDEPENDENT_AMBULATORY_CARE_PROVIDER_SITE_OTHER): Payer: Self-pay | Admitting: Specialist

## 2017-01-30 ENCOUNTER — Other Ambulatory Visit (INDEPENDENT_AMBULATORY_CARE_PROVIDER_SITE_OTHER): Payer: Self-pay | Admitting: Specialist

## 2017-01-30 MED ORDER — METHOCARBAMOL 500 MG PO TABS: 500.0000 mg | ORAL_TABLET | Freq: Four times a day (QID) | ORAL | 1 refills | Status: DC

## 2017-01-30 NOTE — Telephone Encounter (Signed)
Rx for robaxin renewed. jen

## 2017-01-30 NOTE — Telephone Encounter (Signed)
Patient called asking for a refill on robaxin

## 2017-01-30 NOTE — Telephone Encounter (Signed)
Patient called asking for a refill on robaxin. CB # [phone]

## 2017-02-02 NOTE — Telephone Encounter (Signed)
I called and advised that his rx was sent in to his pharm

## 2017-02-12 ENCOUNTER — Telehealth: Payer: Self-pay | Admitting: Internal Medicine

## 2017-02-12 ENCOUNTER — Telehealth: Payer: Self-pay

## 2017-02-12 MED ORDER — LOSARTAN POTASSIUM 50 MG PO TABS: 50.0000 mg | ORAL_TABLET | Freq: Every day | ORAL | 2 refills | Status: DC

## 2017-02-12 NOTE — Telephone Encounter (Signed)
Pt called and said that he needs a refill on losartan (COZAAR) 50 MG tablet. He said that this would need to go to [facility] for a 90 day supply. If this could be sent as soon as possible. They told him that he would not receive it until April 6th. Please advise. Thanks [employer]

## 2017-02-12 NOTE — Telephone Encounter (Signed)
Patient advised refill has been sent to mail order---he has picked up partial amount at local pharm until mail order arrives

## 2017-02-12 NOTE — Telephone Encounter (Signed)
error 

## 2017-03-04 ENCOUNTER — Other Ambulatory Visit: Payer: Self-pay | Admitting: *Deleted

## 2017-03-04 ENCOUNTER — Telehealth (INDEPENDENT_AMBULATORY_CARE_PROVIDER_SITE_OTHER): Payer: Self-pay | Admitting: Specialist

## 2017-03-04 MED ORDER — ESOMEPRAZOLE MAGNESIUM 40 MG PO CPDR: 40.0000 mg | DELAYED_RELEASE_CAPSULE | Freq: Every day | ORAL | 0 refills | Status: DC

## 2017-03-04 NOTE — Telephone Encounter (Signed)
PT REQUESTS REFILL OF HYDROCODONE.   [phone]

## 2017-03-04 NOTE — Telephone Encounter (Signed)
PT REQUESTS REFILL OF HYDROCODONE

## 2017-03-23 ENCOUNTER — Other Ambulatory Visit (INDEPENDENT_AMBULATORY_CARE_PROVIDER_SITE_OTHER): Payer: Self-pay | Admitting: Specialist

## 2017-04-30 ENCOUNTER — Other Ambulatory Visit (INDEPENDENT_AMBULATORY_CARE_PROVIDER_SITE_OTHER): Payer: Self-pay | Admitting: Specialist

## 2017-04-30 NOTE — Telephone Encounter (Signed)
Patient called and asked for a refill on Hydrocodone. CB # [phone] °

## 2017-05-01 NOTE — Telephone Encounter (Signed)
Patient called and asked for a refill on Hydrocodone. CB # [phone]

## 2017-05-04 ENCOUNTER — Other Ambulatory Visit (INDEPENDENT_AMBULATORY_CARE_PROVIDER_SITE_OTHER): Payer: Self-pay | Admitting: Specialist

## 2017-05-04 MED ORDER — HYDROCODONE-ACETAMINOPHEN 5-300 MG PO TABS: 1.0000 | ORAL_TABLET | Freq: Three times a day (TID) | ORAL | 0 refills | Status: DC

## 2017-05-04 NOTE — Telephone Encounter (Signed)
Patient is aware his rx is ready for pick up at the front desk.   Advised patient of message about pain meds. He states that a 1 month supply lasted him 7 months.

## 2017-05-04 NOTE — Telephone Encounter (Signed)
Rx for reduced amount prescribed if he needs continued narcotic then a pain management  Program is recommended.

## 2017-05-06 NOTE — Telephone Encounter (Signed)
error 

## 2017-05-25 ENCOUNTER — Telehealth (INDEPENDENT_AMBULATORY_CARE_PROVIDER_SITE_OTHER): Payer: Self-pay | Admitting: Orthopaedic Surgery

## 2017-05-25 NOTE — Telephone Encounter (Signed)
error 

## 2017-06-19 ENCOUNTER — Ambulatory Visit (INDEPENDENT_AMBULATORY_CARE_PROVIDER_SITE_OTHER): Payer: Self-pay | Admitting: Specialist

## 2017-06-23 ENCOUNTER — Other Ambulatory Visit (INDEPENDENT_AMBULATORY_CARE_PROVIDER_SITE_OTHER): Payer: Self-pay | Admitting: Specialist

## 2017-06-23 NOTE — Telephone Encounter (Signed)
Patient called needing Rx refilled ([facility]apentin) at [facility]. The number to contact patient [phone]

## 2017-06-24 MED ORDER — GABAPENTIN 300 MG PO CAPS: 300.0000 mg | ORAL_CAPSULE | Freq: Two times a day (BID) | ORAL | 11 refills | Status: DC

## 2017-10-13 ENCOUNTER — Other Ambulatory Visit: Payer: Self-pay | Admitting: Internal Medicine

## 2018-01-07 ENCOUNTER — Ambulatory Visit (INDEPENDENT_AMBULATORY_CARE_PROVIDER_SITE_OTHER): Payer: Managed Care, Other (non HMO) | Admitting: Internal Medicine

## 2018-01-07 ENCOUNTER — Other Ambulatory Visit (INDEPENDENT_AMBULATORY_CARE_PROVIDER_SITE_OTHER): Payer: Managed Care, Other (non HMO)

## 2018-01-07 ENCOUNTER — Ambulatory Visit (INDEPENDENT_AMBULATORY_CARE_PROVIDER_SITE_OTHER)
Admission: RE | Admit: 2018-01-07 | Discharge: 2018-01-07 | Disposition: A | Discharge status: Home / Self Care | Payer: Managed Care, Other (non HMO) | Source: Ambulatory Visit | Attending: Internal Medicine | Admitting: Internal Medicine

## 2018-01-07 ENCOUNTER — Encounter: Payer: Self-pay | Admitting: Internal Medicine

## 2018-01-07 ENCOUNTER — Ambulatory Visit (INDEPENDENT_AMBULATORY_CARE_PROVIDER_SITE_OTHER): Payer: Managed Care, Other (non HMO) | Admitting: Specialist

## 2018-01-07 ENCOUNTER — Encounter (INDEPENDENT_AMBULATORY_CARE_PROVIDER_SITE_OTHER): Payer: Self-pay | Admitting: Specialist

## 2018-01-07 VITALS — BP 142/90 | HR 60 | Temp 97.7°F | Ht 72.0 in | Wt 239.0 lb

## 2018-01-07 VITALS — BP 141/90 | HR 78 | Ht 72.0 in | Wt 237.0 lb

## 2018-01-07 DIAGNOSIS — Z0001 Encounter for general adult medical examination with abnormal findings: Secondary | ICD-10-CM

## 2018-01-07 DIAGNOSIS — I10 Essential (primary) hypertension: Secondary | ICD-10-CM | POA: Diagnosis not present

## 2018-01-07 DIAGNOSIS — R0600 Dyspnea, unspecified: Secondary | ICD-10-CM

## 2018-01-07 DIAGNOSIS — M1711 Unilateral primary osteoarthritis, right knee: Secondary | ICD-10-CM

## 2018-01-07 DIAGNOSIS — M48062 Spinal stenosis, lumbar region with neurogenic claudication: Secondary | ICD-10-CM | POA: Diagnosis not present

## 2018-01-07 DIAGNOSIS — M4316 Spondylolisthesis, lumbar region: Secondary | ICD-10-CM

## 2018-01-07 DIAGNOSIS — Z1159 Encounter for screening for other viral diseases: Secondary | ICD-10-CM

## 2018-01-07 DIAGNOSIS — R7302 Impaired glucose tolerance (oral): Secondary | ICD-10-CM

## 2018-01-07 DIAGNOSIS — Z114 Encounter for screening for human immunodeficiency virus [HIV]: Secondary | ICD-10-CM

## 2018-01-07 DIAGNOSIS — J209 Acute bronchitis, unspecified: Secondary | ICD-10-CM

## 2018-01-07 LAB — CBC WITH DIFFERENTIAL/PLATELET
BASOS ABS: 0 10*3/uL (ref 0.0–0.1)
BASOS PCT: 0.9 % (ref 0.0–3.0)
EOS ABS: 0.2 10*3/uL (ref 0.0–0.7)
Eosinophils Relative: 3.8 % (ref 0.0–5.0)
HEMATOCRIT: 43.3 % (ref 39.0–52.0)
Hemoglobin: 14.3 g/dL (ref 13.0–17.0)
LYMPHS ABS: 1.9 10*3/uL (ref 0.7–4.0)
Lymphocytes Relative: 37.1 % (ref 12.0–46.0)
MCHC: 33 g/dL (ref 30.0–36.0)
MCV: 91.4 fl (ref 78.0–100.0)
MONO ABS: 0.7 10*3/uL (ref 0.1–1.0)
Monocytes Relative: 14.5 % — ABNORMAL HIGH (ref 3.0–12.0)
NEUTROS ABS: 2.2 10*3/uL (ref 1.4–7.7)
NEUTROS PCT: 43.7 % (ref 43.0–77.0)
PLATELETS: 212 10*3/uL (ref 150.0–400.0)
RBC: 4.74 Mil/uL (ref 4.22–5.81)
RDW: 13.2 % (ref 11.5–15.5)
WBC: 5 10*3/uL (ref 4.0–10.5)

## 2018-01-07 LAB — PSA: PSA: 0.93 ng/mL (ref 0.10–4.00)

## 2018-01-07 LAB — URINALYSIS, ROUTINE W REFLEX MICROSCOPIC
BILIRUBIN URINE: NEGATIVE
Hgb urine dipstick: NEGATIVE
KETONES UR: NEGATIVE
LEUKOCYTES UA: NEGATIVE
NITRITE: NEGATIVE
RBC / HPF: NONE SEEN (ref 0–?)
Specific Gravity, Urine: 1.01 (ref 1.000–1.030)
TOTAL PROTEIN, URINE-UPE24: NEGATIVE
URINE GLUCOSE: NEGATIVE
UROBILINOGEN UA: 0.2 (ref 0.0–1.0)
WBC, UA: NONE SEEN (ref 0–?)
pH: 5.5 (ref 5.0–8.0)

## 2018-01-07 LAB — HEPATIC FUNCTION PANEL
ALK PHOS: 77 U/L (ref 39–117)
ALT: 22 U/L (ref 0–53)
AST: 20 U/L (ref 0–37)
Albumin: 4.4 g/dL (ref 3.5–5.2)
BILIRUBIN DIRECT: 0.1 mg/dL (ref 0.0–0.3)
TOTAL PROTEIN: 6.9 g/dL (ref 6.0–8.3)
Total Bilirubin: 0.6 mg/dL (ref 0.2–1.2)

## 2018-01-07 LAB — BASIC METABOLIC PANEL
BUN: 18 mg/dL (ref 6–23)
CHLORIDE: 107 meq/L (ref 96–112)
CO2: 28 meq/L (ref 19–32)
CREATININE: 1.11 mg/dL (ref 0.40–1.50)
Calcium: 9.7 mg/dL (ref 8.4–10.5)
GFR: 70.67 mL/min (ref >60.00)
Glucose, Bld: 93 mg/dL (ref 70–99)
POTASSIUM: 4.9 meq/L (ref 3.5–5.1)
Sodium: 140 mEq/L (ref 135–145)

## 2018-01-07 LAB — LIPID PANEL
CHOL/HDL RATIO: 4
CHOLESTEROL: 172 mg/dL (ref 0–200)
HDL: 45.7 mg/dL (ref >39.00)
LDL CALC: 109 mg/dL — AB (ref 0–99)
NonHDL: 126.65
TRIGLYCERIDES: 86 mg/dL (ref 0.0–149.0)
VLDL: 17.2 mg/dL (ref 0.0–40.0)

## 2018-01-07 LAB — HEMOGLOBIN A1C: HEMOGLOBIN A1C: 5.6 % (ref 4.6–6.5)

## 2018-01-07 LAB — TSH: TSH: 0.75 u[IU]/mL (ref 0.35–4.50)

## 2018-01-07 LAB — BRAIN NATRIURETIC PEPTIDE: PRO B NATRI PEPTIDE: 53 pg/mL (ref 0.0–100.0)

## 2018-01-07 MED ORDER — PREDNISONE 10 MG PO TABS: ORAL_TABLET | ORAL | 0 refills | Status: DC

## 2018-01-07 MED ORDER — SALINE SPRAY 0.65 % NA SOLN: 1.0000 | NASAL | 0 refills | Status: DC | PRN

## 2018-01-07 MED ORDER — ALBUTEROL SULFATE HFA 108 (90 BASE) MCG/ACT IN AERS: 2.0000 | INHALATION_SPRAY | Freq: Four times a day (QID) | RESPIRATORY_TRACT | 3 refills | Status: DC | PRN

## 2018-01-07 MED ORDER — HYDROCODONE-ACETAMINOPHEN 5-300 MG PO TABS: 1.0000 | ORAL_TABLET | Freq: Three times a day (TID) | ORAL | 0 refills | Status: DC

## 2018-01-07 MED ORDER — ALBUTEROL SULFATE HFA 108 (90 BASE) MCG/ACT IN AERS: 2.0000 | INHALATION_SPRAY | Freq: Four times a day (QID) | RESPIRATORY_TRACT | 0 refills | Status: DC | PRN

## 2018-01-07 MED ORDER — BUDESONIDE-FORMOTEROL FUMARATE 160-4.5 MCG/ACT IN AERO: 2.0000 | INHALATION_SPRAY | Freq: Two times a day (BID) | RESPIRATORY_TRACT | 3 refills | Status: DC

## 2018-01-07 MED ORDER — METHYLPREDNISOLONE ACETATE 80 MG/ML IJ SUSP
80.0000 mg | Freq: Once | INTRAMUSCULAR | Status: AC
  Administered 2018-01-07: 80 mg via INTRAMUSCULAR

## 2018-01-07 NOTE — Patient Instructions (Addendum)
You will be contacted regarding the referral for: cologuard (but please check with your insurance to make sure is covered)  You had the steroid shot today  Your EKG was OK today  Please take all new medication as prescribed - the prednisone (to [facility]), and the Symbicort inhaler to [facility]  Please continue all other medications as before, and refills have been done if requested.  Please have the pharmacy call with any other refills you may need.  Please continue your efforts at being more active, low cholesterol diet, and weight control.  You are otherwise up to date with prevention measures today.  Please keep your appointments with your specialists as you may have planned  Please go to the XRAY Department in the Basement (go straight as you get off the elevator) for the x-ray testing  Please go to the LAB in the Basement (turn left off the elevator) for the tests to be done today  You will be contacted by phone if any changes need to be made immediately.  Otherwise, you will receive a letter about your results with an explanation, but please check with MyChart first.  Please return in 6 months, or sooner if needed, with Lab testing done 3-5 days before

## 2018-01-07 NOTE — Patient Instructions (Addendum)
Avoid bending, stooping and avoid lifting weights greater than 10 lbs. Avoid prolong standing and walking. Avoid frequent bending and stooping  No lifting greater than 10 lbs. May use ice or moist heat for pain. Weight loss is of benefit.    Knee is suffering from osteoarthritis, only real proven treatments are Weight loss, NSIADs like diclofenac and exercise. Well padded shoes help. Ice the knee 2-3 times a day 15-20 mins at a time.

## 2018-01-07 NOTE — Progress Notes (Signed)
Subjective:    Patient ID: [name], male    DOB: 12-01-1951, 67 y.o.   MRN: [id number]  HPI  Here for wellness and f/u;  Overall doing ok;  Pt denies Chest pain, orthopnea, PND, worsening LE edema, palpitations, dizziness or syncope.  Pt denies neurological change such as new headache, facial or extremity weakness.  Pt denies polydipsia, polyuria, or low sugar symptoms. Pt states overall good compliance with treatment and medications, good tolerability, and has been trying to follow appropriate diet.  Pt denies worsening depressive symptoms, suicidal ideation or panic. No fever, night sweats, wt loss, loss of appetite, or other constitutional symptoms.  Pt states good ability with ADL's, has low fall risk, home safety reviewed and adequate, no other significant changes in hearing or vision, and only occasionally active with exercise. Declines colonoscopy repeat, but will do the cologuard.   Gabapentin has been working ok for RLE radiating pain/sciatica.  Forgot to take losartan this am.   Also c/o incidental onset 2-3 days dyspnea in the setting of hx of asthma, conts to work hard, was wheezing this am, better now after albuterol, but has occurred several times in past few months like this as well.  Nothing else seems to make better or worse.  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  .   Past Medical History:  Diagnosis Date  . ALLERGIC RHINITIS 12/31/2007  . COUGH, CHRONIC 07/18/2007  . DEGENERATIVE JOINT DISEASE 07/18/2007  . Extrinsic asthma, unspecified 09/08/2009  . GERD 07/18/2007  . HYPERLIPIDEMIA 08/14/2010  . Impaired glucose tolerance 09/23/2011  . OBESITY 07/18/2007   No past surgical history on file.  reports that he has quit smoking. he has never used smokeless tobacco. He reports that he does not drink alcohol. His drug history is not on file. family history is not on  file. No Known Allergies Current Outpatient Medications on File Prior to Visit  Medication Sig Dispense Refill  . acetaminophen (TYLENOL) 500 MG tablet Take 1,000 mg by mouth every 6 (six) hours as needed. For pain    . esomeprazole (NEXIUM) 40 MG capsule TAKE 1 CAPSULE DAILY BEFORE BREAKFAST. (YEARLY PHYSICAL WITH LABS ARE DUE, MUST SEE M.D. FOR FUTURE REFILLS) 90 capsule 0  . gabapentin (NEURONTIN) 300 MG capsule Take 1 capsule (300 mg total) by mouth 2 (two) times daily. 60 capsule 11  . losartan (COZAAR) 50 MG tablet Take 1 tablet (50 mg total) by mouth daily. 90 tablet 2  . methocarbamol (ROBAXIN) 500 MG tablet Take 1 tablet (500 mg total) by mouth 4 (four) times daily. 40 tablet 1  . naproxen (NAPROSYN) 500 MG tablet TAKE 1 TABLET TWICE A DAY WITH FOOD 180 tablet 3  . tadalafil (CIALIS) 20 MG tablet Take 1 tablet (20 mg total) by mouth daily as needed for erectile dysfunction. 10 tablet 11  . Hydrocodone-Acetaminophen 5-300 MG TABS Take 1 tablet by mouth 3 (three) times daily. 50 each 0   No current facility-administered medications on file prior to visit.    Review of Systems Constitutional: Negative for other unusual diaphoresis, sweats, appetite or weight changes HENT: Negative for other worsening hearing loss, ear pain, facial swelling, mouth sores or neck stiffness.   Eyes: Negative for other worsening pain, redness or other visual disturbance.  Respiratory: Negative for other stridor or swelling Cardiovascular: Negative for other palpitations  or other chest pain  Gastrointestinal: Negative for worsening diarrhea or loose stools, blood in stool, distention or other pain Genitourinary: Negative for hematuria, flank pain or other change in urine volume.  Musculoskeletal: Negative for myalgias or other joint swelling.  Skin: Negative for other color change, or other wound or worsening drainage.  Neurological: Negative for other syncope or numbness. Hematological: Negative for other  adenopathy or swelling Psychiatric/Behavioral: Negative for hallucinations, other worsening agitation, SI, self-injury, or new decreased concentration All other system neg per pt    Objective:   Physical Exam BP (!) 142/90   Pulse 60   Temp 97.7 F (36.5 C) (Oral)   Ht 6' (1.829 m)   Wt 239 lb (108.4 kg)   SpO2 98%   BMI 32.41 kg/m  VS noted, not ill appaering Constitutional: Pt is oriented to person, place, and time. Appears well-developed and well-nourished, in no significant distress and comfortable Head: Normocephalic and atraumatic  Eyes: Conjunctivae and EOM are normal. Pupils are equal, round, and reactive to light Right Ear: External ear normal without discharge Left Ear: External ear normal without discharge Nose: Nose without discharge or deformity Mouth/Throat: Oropharynx is without other ulcerations and moist  Neck: Normal range of motion. Neck supple. No JVD present. No tracheal deviation present or significant neck LA or mass Cardiovascular: Normal rate, regular rhythm, normal heart sounds and intact distal pulses.   Pulmonary/Chest: WOB normal and breath sounds decreased bilat without rales but with few bilat wheezing  Abdominal: Soft. Bowel sounds are normal. NT. No HSM  Musculoskeletal: Normal range of motion. Exhibits no edema Lymphadenopathy: Has no other cervical adenopathy.  Neurological: Pt is alert and oriented to person, place, and time. Pt has normal reflexes. No cranial nerve deficit. Motor grossly intact, Gait intact Skin: Skin is warm and dry. No rash noted or new ulcerations Psychiatric:  Has normal mood and affect. Behavior is normal without agitation No other exam findings  ECG today I have personaly interpreted SR 62 - no ischemia changes    Assessment & Plan:

## 2018-01-07 NOTE — Progress Notes (Signed)
Office Visit Note   Patient: Joseph FarberRobert M [name]           Date of Birth: 1951-08-20           MRN: [id number] Visit Date: 01/07/2018              Requested by: Corwin LevinsJohn, Haseeb Fiallos W, MD [address] [place], [place] [id number] PCP: Corwin LevinsJohn, Roldan Laforest W, MD   Assessment & Plan: Visit Diagnoses:  1. Unilateral primary osteoarthritis, right knee   2. Spondylolisthesis, lumbar region   3. Spinal stenosis of lumbar region with neurogenic claudication     Plan: Avoid bending, stooping and avoid lifting weights greater than 10 lbs. Avoid prolong standing and walking. Avoid frequent bending and stooping  No lifting greater than 10 lbs. May use ice or moist heat for pain. Weight loss is of benefit.  Knee is suffering from osteoarthritis, only real proven treatments are Weight loss, NSIADs like diclofenac and exercise. Well padded shoes help. Ice the knee 2-3 times a day 15-20 mins at a time. Follow-Up Instructions: No Follow-up on file.   Orders:  No orders of the defined types were placed in this encounter.  Meds ordered this encounter  Medications  . Hydrocodone-Acetaminophen 5-300 MG TABS    Sig: Take 1 tablet by mouth 3 (three) times daily.    Dispense:  50 each    Refill:  0      Procedures: No procedures performed   Clinical Data: No additional findings.   Subjective: Chief Complaint  Patient presents with  . Right Foot - Numbness  . Left Foot - Numbness    67 year old male with history of right knee pain with history of meniscectomy by Dr. Simonne ComeAplington years in the 631970s. Still with difficulty with the right knee but is doing delivery work full time. Takes intermittant hydrocodone about one or two every week for more severe pain.     Review of Systems  Constitutional: Negative.   HENT: Negative.   Eyes: Negative.   Respiratory: Negative.   Cardiovascular: Negative.   Gastrointestinal: Negative.   Endocrine: Negative.   Genitourinary: Negative.   Musculoskeletal:  Negative.   Skin: Negative.   Allergic/Immunologic: Negative.   Neurological: Negative.   Hematological: Negative.   Psychiatric/Behavioral: Negative.      Objective: Vital Signs: BP (!) 141/90 (BP Location: Left Arm, Patient Position: Sitting)   Pulse 78   Ht 6' (1.829 m)   Wt 237 lb (107.5 kg)   BMI 32.14 kg/m   Physical Exam  Constitutional: He is oriented to person, place, and time. He appears well-developed and well-nourished.  HENT:  Head: Normocephalic and atraumatic.  Eyes: EOM are normal. Pupils are equal, round, and reactive to light.  Neck: Normal range of motion. Neck supple.  Pulmonary/Chest: Effort normal and breath sounds normal.  Abdominal: Soft. Bowel sounds are normal.  Neurological: He is alert and oriented to person, place, and time.  Skin: Skin is warm and dry.  Psychiatric: He has a normal mood and affect. His behavior is normal. Judgment and thought content normal.    Right Knee Exam   Tenderness  The patient is experiencing tenderness in the medial retinaculum, medial joint line and patella.  Range of Motion  Extension:  -10 abnormal  Flexion: 130   Tests  McMurray:  Medial - positive Lateral - negative Varus: positive Valgus: negative Lachman:  Anterior - negative    Posterior - negative Drawer:  Anterior -  negative    Posterior - negative Pivot shift: negative Patellar apprehension: positive  Other  Erythema: absent Scars: present Sensation: normal Pulse: present Swelling: none  Comments:  Grating of the P-F joint.      Specialty Comments:  No specialty comments available.  Imaging: No results found.   PMFS History: Patient Active Problem List   Diagnosis Date Noted  . Dyspnea 01/07/2018  . HTN (hypertension) 10/30/2016  . Cough 10/30/2016  . Asthma with exacerbation 10/30/2016  . Edema 11/22/2015  . Erectile dysfunction 01/17/2015  . Encounter for well adult exam with abnormal findings 09/23/2011  . Impaired  glucose tolerance 09/23/2011  . Hyperlipidemia 08/14/2010  . Extrinsic asthma, unspecified 09/08/2009  . ALLERGIC RHINITIS 12/31/2007  . OBESITY 07/18/2007  . GERD 07/18/2007  . DEGENERATIVE JOINT DISEASE 07/18/2007   Past Medical History:  Diagnosis Date  . ALLERGIC RHINITIS 12/31/2007  . COUGH, CHRONIC 07/18/2007  . DEGENERATIVE JOINT DISEASE 07/18/2007  . Extrinsic asthma, unspecified 09/08/2009  . GERD 07/18/2007  . HYPERLIPIDEMIA 08/14/2010  . Impaired glucose tolerance 09/23/2011  . OBESITY 07/18/2007    History reviewed. No pertinent family history.  History reviewed. No pertinent surgical history. Social History   Occupational History  . Not on file  Tobacco Use  . Smoking status: Former Games developer  . Smokeless tobacco: Never Used  . Tobacco comment: Marijuana 30 years  Substance and Sexual Activity  . Alcohol use: No  . Drug use: Not on file  . Sexual activity: Not on file

## 2018-01-08 LAB — HIV ANTIBODY (ROUTINE TESTING W REFLEX): HIV 1&2 Ab, 4th Generation: NONREACTIVE

## 2018-01-08 LAB — HEPATITIS C ANTIBODY
Hepatitis C Ab: NONREACTIVE
SIGNAL TO CUT-OFF: 0.01 (ref <1.00)

## 2018-01-10 NOTE — Assessment & Plan Note (Signed)
Lab Results  Component Value Date   HGBA1C 5.6 01/07/2018  stable overall by history and exam, recent data reviewed with pt, and pt to continue medical treatment as before,  to f/u any worsening symptoms or concerns

## 2018-01-10 NOTE — Assessment & Plan Note (Addendum)
Etiology unclear, most likely bronchospastic type but cant r/o other, ECG reviewed, for BNP, CXR but also depomedrol 80 IM, predpac asd, and add daily symbicort  In addition to the time spent performing CPE, I spent an additional 15 minutes face to face,in which greater than 50% of this time was spent in counseling and coordination of care for patient's illness as documented, including the differential dx, treatment, further evaluation and other management of dyspnea, asthma, hyperglycemia, and HTN

## 2018-01-10 NOTE — Assessment & Plan Note (Signed)
Did not take losartan this and o/w likely reactive, o/w stable overall by history and exam, recent data reviewed with pt, and pt to continue medical treatment as before,  to f/u any worsening symptoms or concerns BP Readings from Last 3 Encounters:  01/07/18 (!) 141/90  01/07/18 (!) 142/90  10/30/16 138/80

## 2018-01-10 NOTE — Assessment & Plan Note (Signed)

## 2018-01-13 ENCOUNTER — Telehealth: Payer: Self-pay | Admitting: Internal Medicine

## 2018-01-13 NOTE — Telephone Encounter (Signed)
Pt has been informed and expressed understanding.  

## 2018-01-13 NOTE — Telephone Encounter (Signed)
Copied from CRM [phone]. Topic: Quick Communication - See Telephone Encounter >> Jan 13, 2018 10:06 AM Terisa Starraylor, Brittany L wrote: CRM for notification. See Telephone encounter for:   01/13/18.  Patient said that budesonide-formoterol (SYMBICORT) 160-4.5 MCG/ACT inhaler [facility] was $270. He wants to know is there something cheaper? [facility] [place] - [address]  He wants to know what else he could have and call him first before he [facility] this back [facility]  Call back is [phone]

## 2018-01-13 NOTE — Telephone Encounter (Signed)
I could not know, as cost of medications are between pt and insurance  OK [facility]?

## 2018-01-20 ENCOUNTER — Ambulatory Visit: Payer: Managed Care, Other (non HMO) | Admitting: Internal Medicine

## 2018-01-20 ENCOUNTER — Encounter: Payer: Self-pay | Admitting: Internal Medicine

## 2018-01-20 VITALS — BP 114/72 | HR 99 | Temp 97.8°F | Ht 72.0 in | Wt 239.0 lb

## 2018-01-20 DIAGNOSIS — J4520 Mild intermittent asthma, uncomplicated: Secondary | ICD-10-CM | POA: Diagnosis not present

## 2018-01-20 DIAGNOSIS — I10 Essential (primary) hypertension: Secondary | ICD-10-CM

## 2018-01-20 DIAGNOSIS — R7302 Impaired glucose tolerance (oral): Secondary | ICD-10-CM

## 2018-01-20 DIAGNOSIS — R6889 Other general symptoms and signs: Secondary | ICD-10-CM

## 2018-01-20 LAB — POC INFLUENZA A&B (BINAX/QUICKVUE)
INFLUENZA A, POC: NEGATIVE
Influenza B, POC: NEGATIVE

## 2018-01-20 MED ORDER — AZITHROMYCIN 250 MG PO TABS: ORAL_TABLET | ORAL | 0 refills | Status: DC

## 2018-01-20 MED ORDER — OSELTAMIVIR PHOSPHATE 75 MG PO CAPS: 75.0000 mg | ORAL_CAPSULE | Freq: Two times a day (BID) | ORAL | 0 refills | Status: DC

## 2018-01-20 MED ORDER — HYDROCOD POLST-CPM POLST ER 10-8 MG/5ML PO SUER: 5.0000 mL | Freq: Two times a day (BID) | ORAL | 0 refills | Status: DC | PRN

## 2018-01-20 MED ORDER — AZITHROMYCIN 250 MG PO TABS
ORAL_TABLET | ORAL | 0 refills | Status: DC
Start: 2018-01-20 — End: 2018-01-20

## 2018-01-20 MED ORDER — HYDROCODONE-ACETAMINOPHEN 5-300 MG PO TABS: 1.0000 | ORAL_TABLET | Freq: Three times a day (TID) | ORAL | 0 refills | Status: DC

## 2018-01-20 NOTE — Patient Instructions (Signed)
Please take all new medication as prescribed - the antibiotic, and cough medicine  Please continue all other medications as before, and refills have been done if requested.  Please have the pharmacy call with any other refills you may need.  Please keep your appointments with your specialists as you may have planned      

## 2018-01-20 NOTE — Progress Notes (Signed)
Subjective:    Patient ID: [name], male    DOB: 02-Sep-1951, 67 y.o.   MRN: [id number]  HPI  Here with acute onset mild to mod 2-3 days fever,l ST, HA, general weakness and malaise, with prod cough sometimes clear and sometime greenish sputum, but Pt denies chest pain, increased sob or doe, wheezing, orthopnea, PND, increased LE swelling, palpitations, dizziness or syncope. Pt denies new neurological symptoms such as new headache, or facial or extremity weakness or numbness   Pt denies polydipsia, polyuria Past Medical History:  Diagnosis Date  . ALLERGIC RHINITIS 12/31/2007  . COUGH, CHRONIC 07/18/2007  . DEGENERATIVE JOINT DISEASE 07/18/2007  . Extrinsic asthma, unspecified 09/08/2009  . GERD 07/18/2007  . HYPERLIPIDEMIA 08/14/2010  . Impaired glucose tolerance 09/23/2011  . OBESITY 07/18/2007   No past surgical history on file.  reports that he has quit smoking. he has never used smokeless tobacco. He reports that he does not drink alcohol. His drug history is not on file. family history is not on file. No Known Allergies Current Outpatient Medications on File Prior to Visit  Medication Sig Dispense Refill  . acetaminophen (TYLENOL) 500 MG tablet Take 1,000 mg by mouth every 6 (six) hours as needed. For pain    . albuterol (PROVENTIL HFA;VENTOLIN HFA) 108 (90 Base) MCG/ACT inhaler Inhale 2 puffs into the lungs every 6 (six) hours as needed for wheezing or shortness of breath. 3 Inhaler 3  . budesonide-formoterol (SYMBICORT) 160-4.5 MCG/ACT inhaler Inhale 2 puffs into the lungs 2 (two) times daily. 3 Inhaler 3  . esomeprazole (NEXIUM) 40 MG capsule TAKE 1 CAPSULE DAILY BEFORE BREAKFAST. (YEARLY PHYSICAL WITH LABS ARE DUE, MUST SEE M.D. FOR FUTURE REFILLS) 90 capsule 0  . gabapentin (NEURONTIN) 300 MG capsule Take 1 capsule (300 mg total) by mouth 2 (two) times daily. 60 capsule 11  . losartan (COZAAR) 50 MG tablet Take 1 tablet (50 mg total) by mouth daily. 90 tablet 2  .  methocarbamol (ROBAXIN) 500 MG tablet Take 1 tablet (500 mg total) by mouth 4 (four) times daily. 40 tablet 1  . naproxen (NAPROSYN) 500 MG tablet TAKE 1 TABLET TWICE A DAY WITH FOOD 180 tablet 3  . predniSONE (DELTASONE) 10 MG tablet 3 tabs by mouth per day for 3 days,2tabs per day for 3 days,1tab per day for 3 days 18 tablet 0  . sodium chloride (OCEAN) 0.65 % SOLN nasal spray Place 1 spray into both nostrils as needed for congestion. 15 mL 0  . tadalafil (CIALIS) 20 MG tablet Take 1 tablet (20 mg total) by mouth daily as needed for erectile dysfunction. 10 tablet 11   No current facility-administered medications on file prior to visit.    Review of Systems  Constitutional: Negative for other unusual diaphoresis or sweats HENT: Negative for ear discharge or swelling Eyes: Negative for other worsening visual disturbances Respiratory: Negative for stridor or other swelling  Gastrointestinal: Negative for worsening distension or other blood Genitourinary: Negative for retention or other urinary change Musculoskeletal: Negative for other MSK pain or swelling Skin: Negative for color change or other new lesions Neurological: Negative for worsening tremors and other numbness  Psychiatric/Behavioral: Negative for worsening agitation or other fatigue No other exam findings    Objective:   Physical Exam BP 114/72   Pulse 99   Temp 97.8 F (36.6 C) (Oral)   Ht 6' (1.829 m)   Wt 239 lb (108.4 kg)   SpO2 99%   BMI 32.41  kg/m  VS noted, mild ill Constitutional: Pt appears in NAD HENT: Head: NCAT.  Right Ear: External ear normal.  Left Ear: External ear normal.  Eyes: . Pupils are equal, round, and reactive to light. Conjunctivae and EOM are normal Bilat tm's with mild erythema.  Max sinus areas non tender.  Pharynx with mild erythema, no exudate Nose: without d/c or deformity Neck: Neck supple. Gross normal ROM Cardiovascular: Normal rate and regular rhythm.   Pulmonary/Chest: Effort  normal and breath sounds without rales or wheezing.  Abd:  Soft, NT, ND, + BS, no organomegaly Neurological: Pt is alert. At baseline orientation, motor grossly intact Skin: Skin is warm. No rashes, other new lesions, no LE edema Psychiatric: Pt behavior is normal without agitation  No other exam findings  POC Influenza A&B(BINAX/QUICKVUE)   Ref Range & Units 11:29  Influenza A, POC Negative Negative   Influenza B, POC Negative Negative             Assessment & Plan:

## 2018-01-23 DIAGNOSIS — R6889 Other general symptoms and signs: Secondary | ICD-10-CM | POA: Insufficient documentation

## 2018-01-23 NOTE — Assessment & Plan Note (Signed)
Primarily with cough, cant r/o viral vs bacterial as Rapid flu has 30% false neg rate, for tamiflu and zpack asd,  to f/u any worsening symptoms or concerns

## 2018-01-23 NOTE — Assessment & Plan Note (Signed)
stable overall by history and exam, and pt to continue medical treatment as before,  to f/u any worsening symptoms or concerns 

## 2018-01-23 NOTE — Assessment & Plan Note (Signed)
stable overall by history and exam, recent data reviewed with pt, and pt to continue medical treatment as before,  to f/u any worsening symptoms or concerns Lab Results  Component Value Date   HGBA1C 5.6 01/07/2018

## 2018-01-23 NOTE — Assessment & Plan Note (Signed)
stable overall by history and exam, recent data reviewed with pt, and pt to continue medical treatment as before,  to f/u any worsening symptoms or concerns BP Readings from Last 3 Encounters:  01/20/18 114/72  01/07/18 (!) 141/90  01/07/18 (!) 142/90

## 2018-02-05 ENCOUNTER — Telehealth: Payer: Self-pay | Admitting: Internal Medicine

## 2018-02-05 MED ORDER — LOSARTAN POTASSIUM 50 MG PO TABS: 50.0000 mg | ORAL_TABLET | Freq: Every day | ORAL | 2 refills | Status: DC

## 2018-02-05 NOTE — Telephone Encounter (Signed)
Copied from CRM [phone]. Topic: Quick Communication - Rx Refill/Question >> Feb 05, 2018  3:55 PM Landry MellowFoltz, Melissa J wrote: Medication:  losartan (COZAAR) 50 MG tablet  Has the patient contacted their pharmacy? Yes.     (Agent: If no, request that the patient contact the pharmacy for the refill.)   Preferred Pharmacy (with phone number or street name[facility]  Cb is [phone]    Agent: Please be advised that RX refills may take up to 3 business days. We ask that you follow-up with your pharmacy.

## 2018-02-05 NOTE — Telephone Encounter (Signed)
LOV 01/20/18 with Dr. Jonny RuizJohn / Refill request for Cozaar / Refilled per protocol /

## 2018-02-08 ENCOUNTER — Ambulatory Visit (INDEPENDENT_AMBULATORY_CARE_PROVIDER_SITE_OTHER): Payer: Managed Care, Other (non HMO) | Admitting: Orthopaedic Surgery

## 2018-02-08 ENCOUNTER — Encounter (INDEPENDENT_AMBULATORY_CARE_PROVIDER_SITE_OTHER): Payer: Self-pay | Admitting: Orthopaedic Surgery

## 2018-02-08 DIAGNOSIS — M1711 Unilateral primary osteoarthritis, right knee: Secondary | ICD-10-CM

## 2018-02-08 MED ORDER — DICLOFENAC SODIUM 1 % TD GEL: 2.0000 g | Freq: Four times a day (QID) | TRANSDERMAL | 5 refills | Status: DC

## 2018-02-08 MED ORDER — LIDOCAINE HCL 1 % IJ SOLN
2.0000 mL | INTRAMUSCULAR | Status: AC | PRN
  Administered 2018-02-08: 2 mL

## 2018-02-08 MED ORDER — BUPIVACAINE HCL 0.5 % IJ SOLN
2.0000 mL | INTRAMUSCULAR | Status: AC | PRN
  Administered 2018-02-08: 2 mL via INTRA_ARTICULAR

## 2018-02-08 MED ORDER — METHYLPREDNISOLONE ACETATE 40 MG/ML IJ SUSP
40.0000 mg | INTRAMUSCULAR | Status: AC | PRN
  Administered 2018-02-08: 40 mg via INTRA_ARTICULAR

## 2018-02-08 NOTE — Progress Notes (Signed)
Office Visit Note   Patient: Joseph FarberRobert M [name]           Date of Birth: Dec 31, 1950           MRN: [id number] Visit Date: 02/08/2018              Requested by: Corwin LevinsJohn, [name], MD [address] [place] [place], [place] [id number] PCP: Corwin LevinsJohn, [name], MD   Assessment & Plan: Visit Diagnoses:  1. Unilateral primary osteoarthritis, right knee     Plan: Impression is 67 year old gentleman with advanced degenerative joint disease of the right knee.  Cortisone injection was performed today.  Prescription for Voltaren gel.  Patient is currently still able to live with the pain and would like to postpone total knee replacement for as long as he can.  Follow-up with me as needed.  Follow-Up Instructions: Return if symptoms worsen or fail to improve.   Orders:  No orders of the defined types were placed in this encounter.  Meds ordered this encounter  Medications  . diclofenac sodium (VOLTAREN) 1 % GEL    Sig: Apply 2 g topically 4 (four) times daily.    Dispense:  1 Tube    Refill:  5      Procedures: Large Joint Inj: R knee on 02/08/2018 1:15 PM Indications: pain Details: 22 G needle  Arthrogram: No  Medications: 40 mg methylPREDNISolone acetate 40 MG/ML; 2 mL lidocaine 1 %; 2 mL bupivacaine 0.5 % Consent was given by the patient. Patient was prepped and draped in the usual sterile fashion.       Clinical Data: No additional findings.   Subjective: Chief Complaint  Patient presents with  . Right Knee - Pain    Patient is 67 year old gentleman who is coming in today for recent onset of right knee pain worse on the medial side.  He has had previous cortisone injections.  He denies any constitutional symptoms.  He does endorse some swelling and giving way.    Review of Systems  Constitutional: Negative.   All other systems reviewed and are negative.    Objective: Vital Signs: There were no vitals taken for this visit.  Physical Exam  Constitutional: He is oriented to  person, place, and time. He appears well-developed and well-nourished.  Pulmonary/Chest: Effort normal.  Abdominal: Soft.  Neurological: He is alert and oriented to person, place, and time.  Skin: Skin is warm.  Psychiatric: He has a normal mood and affect. His behavior is normal. Judgment and thought content normal.  Nursing note and vitals reviewed.   Ortho Exam Right knee exam shows a trace effusion.  Collaterals and cruciates are stable.  Medial joint line tenderness.  Normal range of motion.  1+ patellofemoral crepitus Specialty Comments:  No specialty comments available.  Imaging: No results found.   PMFS History: Patient Active Problem List   Diagnosis Date Noted  . Flu-like symptoms 01/23/2018  . Dyspnea 01/07/2018  . HTN (hypertension) 10/30/2016  . Cough 10/30/2016  . Asthma with exacerbation 10/30/2016  . Edema 11/22/2015  . Erectile dysfunction 01/17/2015  . Encounter for well adult exam with abnormal findings 09/23/2011  . Impaired glucose tolerance 09/23/2011  . Hyperlipidemia 08/14/2010  . Extrinsic asthma 09/08/2009  . ALLERGIC RHINITIS 12/31/2007  . OBESITY 07/18/2007  . GERD 07/18/2007  . DEGENERATIVE JOINT DISEASE 07/18/2007   Past Medical History:  Diagnosis Date  . ALLERGIC RHINITIS 12/31/2007  . COUGH, CHRONIC 07/18/2007  . DEGENERATIVE JOINT DISEASE 07/18/2007  .  Extrinsic asthma, unspecified 09/08/2009  . GERD 07/18/2007  . HYPERLIPIDEMIA 08/14/2010  . Impaired glucose tolerance 09/23/2011  . OBESITY 07/18/2007    History reviewed. No pertinent family history.  History reviewed. No pertinent surgical history. Social History   Occupational History  . Not on file  Tobacco Use  . Smoking status: Former Games developer  . Smokeless tobacco: Never Used  . Tobacco comment: Marijuana 30 years  Substance and Sexual Activity  . Alcohol use: No  . Drug use: Not on file  . Sexual activity: Not on file

## 2018-03-03 ENCOUNTER — Telehealth (INDEPENDENT_AMBULATORY_CARE_PROVIDER_SITE_OTHER): Payer: Self-pay | Admitting: Specialist

## 2018-03-03 NOTE — Telephone Encounter (Signed)
Patient called saying that his knee is really swollen and hot to the touch. He would like to speak with you about seeing Dr. Otelia SergeantNitka asap to get it drained. CB # [phone]

## 2018-03-03 NOTE — Telephone Encounter (Signed)
I called and worked him in with Fayrene FearingJames on 03/04/18, @ 415pm, per pts request

## 2018-03-04 ENCOUNTER — Ambulatory Visit (INDEPENDENT_AMBULATORY_CARE_PROVIDER_SITE_OTHER): Payer: Self-pay

## 2018-03-04 ENCOUNTER — Encounter (INDEPENDENT_AMBULATORY_CARE_PROVIDER_SITE_OTHER): Payer: Self-pay | Admitting: Surgery

## 2018-03-04 ENCOUNTER — Ambulatory Visit (INDEPENDENT_AMBULATORY_CARE_PROVIDER_SITE_OTHER): Payer: Managed Care, Other (non HMO) | Admitting: Surgery

## 2018-03-04 DIAGNOSIS — G8929 Other chronic pain: Secondary | ICD-10-CM | POA: Diagnosis not present

## 2018-03-04 DIAGNOSIS — M25561 Pain in right knee: Secondary | ICD-10-CM | POA: Diagnosis not present

## 2018-03-04 DIAGNOSIS — M1711 Unilateral primary osteoarthritis, right knee: Secondary | ICD-10-CM | POA: Diagnosis not present

## 2018-03-04 NOTE — Progress Notes (Signed)
Office Visit Note   Patient: Joseph FarberRobert M [name]           Date of Birth: 17-Nov-1951           MRN: [id number] Visit Date: 03/04/2018              Requested by: Corwin LevinsJohn, Senya Hinzman W, MD [address] [place] [place], [place] [id number] PCP: Corwin LevinsJohn, Rachit Grim W, MD   Assessment & Plan: Visit Diagnoses:  1. Chronic pain of right knee   2. Unilateral primary osteoarthritis, right knee     Plan: I advised patient that only viable option at this point regarding his right knee would be total knee replacement.  I do not recommend aspirating his knee today.  He actually has about 120 degrees flexion with may be 2+ effusion.  I do not think Visco supplementation would be of any benefit to him due to the severity of his medial compartment bone-on-bone changes.  He will follow-up as needed and let us know when he is ready to proceed with scheduling surgery.  Patient was given samples of Pennsaid to use.  Follow-Up Instructions: Return if symptoms worsen or fail to improve.   Orders:  Orders Placed This Encounter  Procedures  . XR KNEE 3 VIEW RIGHT   No orders of the defined types were placed in this encounter.     Procedures: No procedures performed   Clinical Data: No additional findings.   Subjective: Chief Complaint  Patient presents with  . Right Knee - Pain    HPI Patient returns with complaints of right knee pain.  Has known history of severe end-stage right knee arthritis.  Dr. Roda Shuttersxu performed right knee intra-articular Marcaine/Depo-Medrol injection a couple weeks ago and this did not give any long-term improvement.  He continues to have ongoing pain and swelling in his knee and is wanting to have knee aspiration today.  He has been told that definitive treatment is total knee replacement Review of Systems No current cardiac pulmonary GI GU issues  Objective: Vital Signs: There were no vitals taken for this visit.  Physical Exam  Constitutional: He is oriented to person, place, and time.  No distress.  HENT:  Head: Normocephalic and atraumatic.  Eyes: Pupils are equal, round, and reactive to light.  Pulmonary/Chest: No respiratory distress.  Musculoskeletal:  Gait is somewhat antalgic.  Right knee positive crepitus.  Range of motion about 0-120 degrees.  2+ effusion.  Palpable loose bodies suprapatellar pouch.  Neurological: He is alert and oriented to person, place, and time.    Ortho Exam  Specialty Comments:  No specialty comments available.  Imaging: No results found.   PMFS History: Patient Active Problem List   Diagnosis Date Noted  . Flu-like symptoms 01/23/2018  . Dyspnea 01/07/2018  . HTN (hypertension) 10/30/2016  . Cough 10/30/2016  . Asthma with exacerbation 10/30/2016  . Edema 11/22/2015  . Erectile dysfunction 01/17/2015  . Encounter for well adult exam with abnormal findings 09/23/2011  . Impaired glucose tolerance 09/23/2011  . Hyperlipidemia 08/14/2010  . Extrinsic asthma 09/08/2009  . ALLERGIC RHINITIS 12/31/2007  . OBESITY 07/18/2007  . GERD 07/18/2007  . DEGENERATIVE JOINT DISEASE 07/18/2007   Past Medical History:  Diagnosis Date  . ALLERGIC RHINITIS 12/31/2007  . COUGH, CHRONIC 07/18/2007  . DEGENERATIVE JOINT DISEASE 07/18/2007  . Extrinsic asthma, unspecified 09/08/2009  . GERD 07/18/2007  . HYPERLIPIDEMIA 08/14/2010  . Impaired glucose tolerance 09/23/2011  . OBESITY 07/18/2007    History reviewed.  No pertinent family history.  History reviewed. No pertinent surgical history. Social History   Occupational History  . Not on file  Tobacco Use  . Smoking status: Former Games developer  . Smokeless tobacco: Never Used  . Tobacco comment: Marijuana 30 years  Substance and Sexual Activity  . Alcohol use: No  . Drug use: Not on file  . Sexual activity: Not on file

## 2018-03-25 ENCOUNTER — Telehealth (INDEPENDENT_AMBULATORY_CARE_PROVIDER_SITE_OTHER): Payer: Self-pay | Admitting: Specialist

## 2018-03-25 NOTE — Telephone Encounter (Signed)
Patient called asked if he can be seen sooner by Dr Otelia SergeantNitka is an appointment comes open. Patient has an appointment 04/01/18 at 10:30am. Patient said there is fluid on his knee. The number to contact patient is [phone]

## 2018-03-25 NOTE — Telephone Encounter (Signed)
I put patient on cancellation list 

## 2018-03-29 ENCOUNTER — Ambulatory Visit (INDEPENDENT_AMBULATORY_CARE_PROVIDER_SITE_OTHER): Payer: Managed Care, Other (non HMO) | Admitting: Specialist

## 2018-03-29 ENCOUNTER — Encounter (INDEPENDENT_AMBULATORY_CARE_PROVIDER_SITE_OTHER): Payer: Self-pay | Admitting: Specialist

## 2018-03-29 VITALS — BP 141/99 | HR 85 | Ht 72.0 in | Wt 237.0 lb

## 2018-03-29 DIAGNOSIS — M1711 Unilateral primary osteoarthritis, right knee: Secondary | ICD-10-CM | POA: Diagnosis not present

## 2018-03-29 DIAGNOSIS — M7041 Prepatellar bursitis, right knee: Secondary | ICD-10-CM | POA: Diagnosis not present

## 2018-03-29 NOTE — Progress Notes (Signed)
Office Visit Note   Patient: [name]           Date of Birth: July 01, 1951           MRN: [id number] Visit Date: 03/29/2018              Requested by: Corwin Levins, MD [address] [place], [place] [address] PCP: Corwin Levins, MD   Assessment & Plan: Visit Diagnoses:  1. Prepatellar bursitis, right knee   2. Unilateral primary osteoarthritis, right knee     Plan: Decrease kneeling, squatting and ladder or stair climbing. Extension exercises. Ice locally at the end of the day. Hot shower and stretching exercises at the beginning of the day.  Follow-Up Instructions: Return in about 3 years (around 03/29/2021).   Orders:  Orders Placed This Encounter  Procedures  . Incision & Drainage   No orders of the defined types were placed in this encounter.     Procedures: Incision & Drainage Date/Time: 03/29/2018 4:32 PM Performed by: Kerrin Champagne, MD Authorized by: Kerrin Champagne, MD   Consent:    Consent obtained:  Verbal   Consent given by:  Patient   Risks discussed:  Bleeding and infection   Alternatives discussed:  Delayed treatment, alternative treatment, observation and no treatment Location:    Type:  Bursa   Size:  Right prepatella bursitis, moderately large amount of fluid.   Location:  Lower extremity   Lower extremity location:  Knee   Knee location:  R knee Pre-procedure details:    Skin preparation:  Alcohol and Betadine Anesthesia (see MAR for exact dosages):    Anesthesia method:  Local infiltration   Local anesthetic:  Lidocaine 2% w/o epi and bupivacaine 0.5% WITH epi Procedure type:    Complexity:  Simple Procedure details:    Needle aspiration: yes     Needle size:  18 G   Drainage:  Serosanguinous   Drainage amount:  Moderate Post-procedure details:    Patient tolerance of procedure:  Tolerated with difficulty Comments:     Aspirated the right prepatellabursa 24cc of yellow clear fluid, consistent with inflamatory bursa fluid.       Clinical Data: No additional findings.   Subjective: Chief Complaint  Patient presents with  . Right Knee - Edema, Pain    67 year old male with history of right greater than left severe osteoarthritis of the knee and he has been under going cortisone injection almost every 3 months for pain and swelling. He was seen with severe pain and night pain. He takes vicodin for pain and tylenol for pain. Has tried ice but it doesn't help and a neoprene sleeve with out  Help.    Review of Systems  Constitutional: Negative.   HENT: Negative.   Eyes: Negative.   Respiratory: Negative.   Cardiovascular: Negative.   Gastrointestinal: Negative.   Endocrine: Negative.   Genitourinary: Negative.   Musculoskeletal: Negative.   Skin: Negative.   Allergic/Immunologic: Negative.   Neurological: Negative.   Hematological: Negative.   Psychiatric/Behavioral: Negative.      Objective: Vital Signs: BP (!) 141/99 (BP Location: Left Arm, Patient Position: Sitting)   Pulse 85   Ht 6' (1.829 m)   Wt 237 lb (107.5 kg)   BMI 32.14 kg/m   Physical Exam  Constitutional: He is oriented to person, place, and time. He appears well-developed and well-nourished.  HENT:  Head: Normocephalic and atraumatic.  Eyes: Pupils are equal,  round, and reactive to light. EOM are normal.  Neck: Normal range of motion. Neck supple.  Pulmonary/Chest: Effort normal and breath sounds normal.  Abdominal: Soft. Bowel sounds are normal.  Neurological: He is alert and oriented to person, place, and time.  Skin: Skin is warm and dry.  Psychiatric: He has a normal mood and affect. His behavior is normal. Judgment and thought content normal.    Right Knee Exam   Tenderness  The patient is experiencing tenderness in the medial joint line, patella and patellar tendon.  Range of Motion  Extension:  -5 abnormal  Flexion: 130   Tests  McMurray:  Medial - positive Lateral - negative Valgus: negative Lachman:   Anterior - negative    Posterior - negative Drawer:  Anterior - negative    Posterior - negative Pivot shift: negative  Other  Erythema: absent Scars: absent Sensation: normal Pulse: present Swelling: none      Specialty Comments:  No specialty comments available.  Imaging: No results found.   PMFS History: Patient Active Problem List   Diagnosis Date Noted  . Flu-like symptoms 01/23/2018  . Dyspnea 01/07/2018  . HTN (hypertension) 10/30/2016  . Cough 10/30/2016  . Asthma with exacerbation 10/30/2016  . Edema 11/22/2015  . Erectile dysfunction 01/17/2015  . Encounter for well adult exam with abnormal findings 09/23/2011  . Impaired glucose tolerance 09/23/2011  . Hyperlipidemia 08/14/2010  . Extrinsic asthma 09/08/2009  . ALLERGIC RHINITIS 12/31/2007  . OBESITY 07/18/2007  . GERD 07/18/2007  . DEGENERATIVE JOINT DISEASE 07/18/2007   Past Medical History:  Diagnosis Date  . ALLERGIC RHINITIS 12/31/2007  . COUGH, CHRONIC 07/18/2007  . DEGENERATIVE JOINT DISEASE 07/18/2007  . Extrinsic asthma, unspecified 09/08/2009  . GERD 07/18/2007  . HYPERLIPIDEMIA 08/14/2010  . Impaired glucose tolerance 09/23/2011  . OBESITY 07/18/2007    History reviewed. No pertinent family history.  History reviewed. No pertinent surgical history. Social History   Occupational History  . Not on file  Tobacco Use  . Smoking status: Former Games developer  . Smokeless tobacco: Never Used  . Tobacco comment: Marijuana 30 years  Substance and Sexual Activity  . Alcohol use: No  . Drug use: Not on file  . Sexual activity: Not on file

## 2018-03-29 NOTE — Patient Instructions (Signed)
Decrease kneeling, squatting and ladder or stair climbing. Extension exercises. Ice locally at the end of the day. Hot shower and stretching exercises at the beginning of the day.

## 2018-03-30 ENCOUNTER — Encounter (INDEPENDENT_AMBULATORY_CARE_PROVIDER_SITE_OTHER): Payer: Self-pay | Admitting: Specialist

## 2018-03-30 ENCOUNTER — Ambulatory Visit (INDEPENDENT_AMBULATORY_CARE_PROVIDER_SITE_OTHER): Payer: Managed Care, Other (non HMO) | Admitting: Specialist

## 2018-03-30 ENCOUNTER — Telehealth (INDEPENDENT_AMBULATORY_CARE_PROVIDER_SITE_OTHER): Payer: Self-pay | Admitting: Specialist

## 2018-03-30 VITALS — BP 145/80 | HR 75 | Temp 97.4°F | Ht 72.0 in | Wt 237.0 lb

## 2018-03-30 DIAGNOSIS — M1711 Unilateral primary osteoarthritis, right knee: Secondary | ICD-10-CM | POA: Diagnosis not present

## 2018-03-30 DIAGNOSIS — M25561 Pain in right knee: Secondary | ICD-10-CM | POA: Diagnosis not present

## 2018-03-30 MED ORDER — HYDROCODONE-ACETAMINOPHEN 5-300 MG PO TABS: 1.0000 | ORAL_TABLET | Freq: Three times a day (TID) | ORAL | 0 refills | Status: DC

## 2018-03-30 MED ORDER — BUPIVACAINE HCL 0.25 % IJ SOLN
4.0000 mL | INTRAMUSCULAR | Status: AC | PRN
  Administered 2018-03-30: 4 mL via INTRA_ARTICULAR

## 2018-03-30 MED ORDER — METHYLPREDNISOLONE ACETATE 40 MG/ML IJ SUSP
40.0000 mg | INTRAMUSCULAR | Status: AC | PRN
  Administered 2018-03-30: 40 mg via INTRA_ARTICULAR

## 2018-03-30 NOTE — Progress Notes (Signed)
Office Visit Note   Patient: [name]           Date of Birth: 12-22-1950           MRN: [id number] Visit Date: 03/30/2018              Requested by: Corwin Levins, MD [address] [place], [place] [address] PCP: Corwin Levins, MD   Assessment & Plan: Visit Diagnoses:  1. Acute pain of right knee   2. Unilateral primary osteoarthritis, right knee     Plan: Knee is suffering from osteoarthritis, only real proven treatments are Weight loss, NSIADs like naprosyn and exercise. Well padded shoes help. Ice the knee 2-3 times a day 15-20 mins at a time. Hydrocodone for more severe pain  Follow-Up Instructions: Return in about 6 weeks (around 05/11/2018).   Orders:  Orders Placed This Encounter  Procedures  . Large Joint Inj: R knee   Meds ordered this encounter  Medications  . HYDROcodone-Acetaminophen 5-300 MG TABS    Sig: Take 1 tablet by mouth 3 (three) times daily.    Dispense:  50 each    Refill:  0      Procedures: Large Joint Inj: R knee on 03/30/2018 4:57 PM Indications: pain Details: 25 G 1.5 in needle, anteromedial approach  Arthrogram: No  Medications: 40 mg methylPREDNISolone acetate 40 MG/ML; 4 mL bupivacaine 0.25 % Outcome: tolerated well, no immediate complications  Bandaid applied. Procedure, treatment alternatives, risks and benefits explained, specific risks discussed. Consent was given by the patient. Immediately prior to procedure a time out was called to verify the correct patient, procedure, equipment, support staff and site/side marked as required. Patient was prepped and draped in the usual sterile fashion.       Clinical Data: No additional findings.   Subjective: Chief Complaint  Patient presents with  . Right Knee - Pain    67 year old male with history of right knee OA and recent prepatella bursitis. Now with significant worsening of right knee. The prepatella bursa was aspirated and injected with corticosteroid but  the pain relief was only for a few hours and the pain returned. He has pain over the right medial knee, pain with weight bearing. He is considering TKA this  Fall.    Review of Systems  Constitutional: Negative.   HENT: Negative.   Eyes: Negative.   Respiratory: Negative.   Cardiovascular: Negative.   Gastrointestinal: Negative.   Endocrine: Negative.   Genitourinary: Negative.   Musculoskeletal: Negative.   Skin: Negative.   Allergic/Immunologic: Negative.   Neurological: Negative.   Hematological: Negative.   Psychiatric/Behavioral: Negative.      Objective: Vital Signs: BP (!) 145/80   Pulse 75   Temp (!) 97.4 F (36.3 C)   Ht 6' (1.829 m)   Wt 237 lb (107.5 kg)   BMI 32.14 kg/m   Physical Exam  Constitutional: He is oriented to person, place, and time. He appears well-developed and well-nourished.  HENT:  Head: Normocephalic and atraumatic.  Eyes: Pupils are equal, round, and reactive to light. EOM are normal.  Neck: Normal range of motion. Neck supple.  Pulmonary/Chest: Effort normal and breath sounds normal.  Abdominal: Soft. Bowel sounds are normal.  Musculoskeletal: Normal range of motion.  Neurological: He is alert and oriented to person, place, and time.  Skin: Skin is warm and dry.  Psychiatric: He has a normal mood and affect. His behavior is normal. Judgment and  thought content normal.    Right Knee Exam   Tenderness  The patient is experiencing tenderness in the medial retinaculum, patellar tendon and patella.  Range of Motion  Extension: -10  Flexion: 120   Tests  McMurray:  Medial - negative  Varus: positive Valgus: positive Lachman:  Anterior - negative    Posterior - negative Drawer:  Anterior - negative    Posterior - negative Pivot shift: negative Patellar apprehension: negative  Other  Erythema: absent Scars: present Sensation: normal Pulse: present Swelling: mild  Comments:  Some mild recurrence of the prepatella bursa  swelling may 20%. There is more pain right medial knee.       Specialty Comments:  No specialty comments available.  Imaging: No results found.   PMFS History: Patient Active Problem List   Diagnosis Date Noted  . Flu-like symptoms 01/23/2018  . Dyspnea 01/07/2018  . HTN (hypertension) 10/30/2016  . Cough 10/30/2016  . Asthma with exacerbation 10/30/2016  . Edema 11/22/2015  . Erectile dysfunction 01/17/2015  . Encounter for well adult exam with abnormal findings 09/23/2011  . Impaired glucose tolerance 09/23/2011  . Hyperlipidemia 08/14/2010  . Extrinsic asthma 09/08/2009  . ALLERGIC RHINITIS 12/31/2007  . OBESITY 07/18/2007  . GERD 07/18/2007  . DEGENERATIVE JOINT DISEASE 07/18/2007   Past Medical History:  Diagnosis Date  . ALLERGIC RHINITIS 12/31/2007  . COUGH, CHRONIC 07/18/2007  . DEGENERATIVE JOINT DISEASE 07/18/2007  . Extrinsic asthma, unspecified 09/08/2009  . GERD 07/18/2007  . HYPERLIPIDEMIA 08/14/2010  . Impaired glucose tolerance 09/23/2011  . OBESITY 07/18/2007    History reviewed. No pertinent family history.  History reviewed. No pertinent surgical history. Social History   Occupational History  . Not on file  Tobacco Use  . Smoking status: Former Games developer  . Smokeless tobacco: Never Used  . Tobacco comment: Marijuana 30 years  Substance and Sexual Activity  . Alcohol use: No  . Drug use: Not on file  . Sexual activity: Not on file

## 2018-03-30 NOTE — Telephone Encounter (Signed)
Patient called asked for a call back when you get a chance. The number to contact patient is [phone]

## 2018-03-30 NOTE — Patient Instructions (Addendum)
  Knee is suffering from osteoarthritis, only real proven treatments are Weight loss, NSIADs like naprosyn and exercise. Well padded shoes help. Ice the knee 2-3 times a day 15-20 mins at a time. Hydrocodone for more severe pain

## 2018-03-30 NOTE — Telephone Encounter (Signed)
I called and worked patient in due to increased pain

## 2018-04-01 ENCOUNTER — Ambulatory Visit (INDEPENDENT_AMBULATORY_CARE_PROVIDER_SITE_OTHER): Payer: Managed Care, Other (non HMO) | Admitting: Specialist

## 2018-04-27 ENCOUNTER — Telehealth (INDEPENDENT_AMBULATORY_CARE_PROVIDER_SITE_OTHER): Payer: Self-pay | Admitting: Specialist

## 2018-04-27 NOTE — Telephone Encounter (Signed)
Pleases sched with Fayrene Fearing

## 2018-04-27 NOTE — Telephone Encounter (Signed)
Patient called advised still his right knee is still swollen and he could hardly walk on it yesterday. Patient said he went to work today but don't think he should have gone into work. Patient said he don't know what he should do. Patient said he has put ice and wrapped his knee. Patient said his knee fill warm.  Patient said his knee still have fluid in it. The number to contact patient is [phone]

## 2018-04-29 ENCOUNTER — Encounter (INDEPENDENT_AMBULATORY_CARE_PROVIDER_SITE_OTHER): Payer: Self-pay | Admitting: Surgery

## 2018-04-29 ENCOUNTER — Ambulatory Visit (INDEPENDENT_AMBULATORY_CARE_PROVIDER_SITE_OTHER): Payer: Managed Care, Other (non HMO) | Admitting: Surgery

## 2018-04-29 DIAGNOSIS — M1711 Unilateral primary osteoarthritis, right knee: Secondary | ICD-10-CM | POA: Diagnosis not present

## 2018-04-29 NOTE — Progress Notes (Signed)
   Office Visit Note   Patient: [name]           Date of Birth: [birth year]           MRN: [id number] Visit Date: 04/29/2018              Requested by: Corwin Levins, MD [address] [place], [place] [address] PCP: Corwin Levins, MD   Assessment & Plan: Visit Diagnoses:  1. Unilateral primary osteoarthritis, right knee     Plan: Advised patient that I absolutely would not aspirate his knee today risk of infection.  He just had knee injection a month ago.  He understands that we absolutely cannot keep doing this.  I did fill out preop paperwork today total knee replacement.  He will call and let us know he is ready to schedule.    Follow-Up Instructions: Return if symptoms worsen or fail to improve.   Orders:  No orders of the defined types were placed in this encounter.  No orders of the defined types were placed in this encounter.     Procedures: No procedures performed   Clinical Data: No additional findings.   Subjective: Chief Complaint  Patient presents with  . Right Knee - Pain    HPI Patient with history of end-stage DJD right knee returns requesting knee aspiration.  He was last seen by Dr. Otelia Sergeant about a month ago and had knee injection.  My last office visit with him I advised that definitive treatment for his significant degenerative changes would be total knee replacement.  He continues to have ongoing pain and swelling throughout his knee.  He [place] his [place] up. Review of Systems No current cardiac pulmonary GI GU issues  Objective: Vital Signs: There were no vitals taken for this visit.  Physical Exam Gait is somewhat antalgic.  Right knee range of motion about 0 to 120 degrees.  Positive effusion. Ortho Exam  Specialty Comments:  No specialty comments available.  Imaging: No results found.   PMFS History: Patient Active Problem List   Diagnosis Date Noted  . Flu-like  symptoms 01/23/2018  . Dyspnea 01/07/2018  . HTN (hypertension) 10/30/2016  . Cough 10/30/2016  . Asthma with exacerbation 10/30/2016  . Edema 11/22/2015  . Erectile dysfunction 01/17/2015  . Corneal abrasion, right, sequela 06/27/2014  . Encounter for well adult exam with abnormal findings 09/23/2011  . Impaired glucose tolerance 09/23/2011  . Hyperlipidemia 08/14/2010  . Extrinsic asthma 09/08/2009  . ALLERGIC RHINITIS 12/31/2007  . OBESITY 07/18/2007  . GERD 07/18/2007  . DEGENERATIVE JOINT DISEASE 07/18/2007   Past Medical History:  Diagnosis Date  . ALLERGIC RHINITIS 12/31/2007  . COUGH, CHRONIC 07/18/2007  . DEGENERATIVE JOINT DISEASE 07/18/2007  . Extrinsic asthma, unspecified 09/08/2009  . GERD 07/18/2007  . HYPERLIPIDEMIA 08/14/2010  . Impaired glucose tolerance 09/23/2011  . OBESITY 07/18/2007    History reviewed. No pertinent family history.  History reviewed. No pertinent surgical history. Social History   Occupational History  . Not on file  Tobacco Use  . Smoking status: Former Games developer  . Smokeless tobacco: Never Used  . Tobacco comment: Marijuana 30 years  Substance and Sexual Activity  . Alcohol use: No  . Drug use: Not on file  . Sexual activity: Not on file

## 2018-05-04 ENCOUNTER — Other Ambulatory Visit: Payer: Self-pay | Admitting: Internal Medicine

## 2018-06-04 ENCOUNTER — Other Ambulatory Visit (INDEPENDENT_AMBULATORY_CARE_PROVIDER_SITE_OTHER): Payer: Self-pay | Admitting: Specialist

## 2018-06-07 NOTE — Telephone Encounter (Signed)
naproxen refill request

## 2018-06-09 ENCOUNTER — Other Ambulatory Visit (INDEPENDENT_AMBULATORY_CARE_PROVIDER_SITE_OTHER): Payer: Self-pay

## 2018-06-17 NOTE — Pre-Procedure Instructions (Signed)
Eino FarberRobert M Hoppel  06/17/2018      [facility] - [place] - [address] [place] [id number] Phone: [phone] Fax: [phone]  [phone] [place] - [address] [place] [address] Phone: [phone] Fax: [phone]  [facility] - [place] - [address] [place] [id number] Phone: [phone] Fax: [phone]    Your procedure is scheduled on July 29  [facility] at 0530 A.M.  Call this number if you have problems the morning of surgery:  [phone]   Remember:  Do not eat or drink after midnight.      Take these medicines the morning of surgery with A SIP OF WATER  acetaminophen (TYLENOL)  albuterol (PROVENTIL HFA;VENTOLIN HFA) budesonide-formoterol (SYMBICORT) esomeprazole (NEXIUM) HYDROcodone-Acetaminophen methocarbamol (ROBAXIN)   7 days prior to surgery STOP taking any Aspirin(unless otherwise instructed by your surgeon), Aleve, Naproxen, Ibuprofen, Motrin, Advil, Goody's, BC's, all herbal medications, fish oil, and all vitamins     Do not wear jewelry  Do not wear lotions, powders, or cologne, or deodorant.  Men may shave face and neck.  Do not bring valuables to the hospital.  [facility] is not responsible for any belongings or valuables.  Contacts, dentures or bridgework may not be worn into surgery.  Leave your suitcase in the car.  After surgery it may be brought to your room.  For patients admitted to the hospital, discharge time will be determined by your treatment team.  Patients discharged the day of surgery will not be allowed to drive home.    Special instructions:   [place]- Preparing For Surgery  Before surgery, you can play an important role. Because skin is not sterile, your skin needs to be as free of germs as possible. You can reduce the  number of germs on your skin by washing with CHG (chlorahexidine gluconate) Soap before surgery.  CHG is an antiseptic cleaner which kills germs and bonds with the skin to continue killing germs even after washing.    Oral Hygiene is also important to reduce your risk of infection.  Remember - BRUSH YOUR TEETH THE MORNING OF SURGERY WITH YOUR REGULAR TOOTHPASTE  Please do not use if you have an allergy to CHG or antibacterial soaps. If your skin becomes reddened/irritated stop using the CHG.  Do not shave (including legs and underarms) for at least 48 hours prior to first CHG shower. It is OK to shave your face.  Please follow these instructions carefully.   1. Shower the NIGHT BEFORE SURGERY and the MORNING OF SURGERY with CHG.   2. If you chose to wash your hair, wash your hair first as usual with your normal shampoo.  3. After you shampoo, rinse your hair and body thoroughly to remove the shampoo.  4. Use CHG as you would any other liquid soap. You can apply CHG directly to the skin and wash gently with a scrungie or a clean washcloth.   5. Apply the CHG Soap to your body ONLY FROM THE NECK DOWN.  Do not use on open wounds or open sores. Avoid contact with your eyes, ears, mouth and genitals (private parts). Wash Face and genitals (private parts)  with your normal soap.  6. Wash thoroughly, paying special attention to the area where your surgery will be performed.  7.  Thoroughly rinse your body with warm water from the neck down.  8. DO NOT shower/wash with your normal soap after using and rinsing off the CHG Soap.  9. Pat yourself dry with a CLEAN TOWEL.  10. Wear CLEAN PAJAMAS to bed the night before surgery, wear comfortable clothes the morning of surgery  11. Place CLEAN SHEETS on your bed the night of your first shower and DO NOT SLEEP WITH PETS.    Day of Surgery:  Do not apply any deodorants/lotions.  Please wear clean clothes to the hospital/surgery center.   Remember  to brush your teeth WITH YOUR REGULAR TOOTHPASTE.    Please read over the following fact sheets that you were given.

## 2018-06-18 ENCOUNTER — Other Ambulatory Visit: Payer: Self-pay

## 2018-06-18 ENCOUNTER — Encounter (HOSPITAL_COMMUNITY): Payer: Self-pay

## 2018-06-18 ENCOUNTER — Encounter (HOSPITAL_COMMUNITY)
Admission: RE | Admit: 2018-06-18 | Discharge: 2018-06-18 | Disposition: A | Discharge status: Home / Self Care | Payer: Managed Care, Other (non HMO) | Source: Ambulatory Visit | Attending: Specialist | Admitting: Specialist

## 2018-06-18 DIAGNOSIS — Z01812 Encounter for preprocedural laboratory examination: Secondary | ICD-10-CM | POA: Insufficient documentation

## 2018-06-18 HISTORY — DX: Essential (primary) hypertension: I10

## 2018-06-18 LAB — BASIC METABOLIC PANEL
Anion gap: 6 (ref 5–15)
BUN: 17 mg/dL (ref 8–23)
CO2: 25 mmol/L (ref 22–32)
Calcium: 9.4 mg/dL (ref 8.9–10.3)
Chloride: 110 mmol/L (ref 98–111)
Creatinine, Ser: 1.11 mg/dL (ref 0.61–1.24)
GFR calc non Af Amer: >60 mL/min (ref >60)
Glucose, Bld: 87 mg/dL (ref 70–99)
POTASSIUM: 4.7 mmol/L (ref 3.5–5.1)
SODIUM: 141 mmol/L (ref 135–145)

## 2018-06-18 LAB — SURGICAL PCR SCREEN
MRSA, PCR: NEGATIVE
STAPHYLOCOCCUS AUREUS: POSITIVE — AB

## 2018-06-18 LAB — CBC
HCT: 44.7 % (ref 39.0–52.0)
HEMOGLOBIN: 14.2 g/dL (ref 13.0–17.0)
MCH: 30.1 pg (ref 26.0–34.0)
MCHC: 31.8 g/dL (ref 30.0–36.0)
MCV: 94.9 fL (ref 78.0–100.0)
Platelets: 217 10*3/uL (ref 150–400)
RBC: 4.71 MIL/uL (ref 4.22–5.81)
RDW: 12.4 % (ref 11.5–15.5)
WBC: 5.8 10*3/uL (ref 4.0–10.5)

## 2018-06-24 ENCOUNTER — Ambulatory Visit (INDEPENDENT_AMBULATORY_CARE_PROVIDER_SITE_OTHER): Payer: Managed Care, Other (non HMO) | Admitting: Surgery

## 2018-06-24 ENCOUNTER — Encounter (INDEPENDENT_AMBULATORY_CARE_PROVIDER_SITE_OTHER): Payer: Self-pay | Admitting: Surgery

## 2018-06-24 VITALS — BP 120/86 | HR 86 | Ht 73.0 in | Wt 232.0 lb

## 2018-06-24 DIAGNOSIS — M1711 Unilateral primary osteoarthritis, right knee: Secondary | ICD-10-CM

## 2018-06-24 NOTE — Progress Notes (Signed)
67 year old white male history of end-stage DJD right knee and chronic pain presents for preop evaluation today.  Today full history and physical performed.  Patient states that he was watching the preop total knee replacement video and there was something as stated that all dental caries should be addressed before schedule surgery.  He stated that he currently has 3 cavities and was encouraged last year to have this addressed by his dentist but he did not return to do so.  Will speak with Dr. Otelia SergeantNitka to see if we should proceed with surgery.

## 2018-06-25 ENCOUNTER — Telehealth (INDEPENDENT_AMBULATORY_CARE_PROVIDER_SITE_OTHER): Payer: Self-pay | Admitting: Radiology

## 2018-06-25 ENCOUNTER — Telehealth (INDEPENDENT_AMBULATORY_CARE_PROVIDER_SITE_OTHER): Payer: Self-pay | Admitting: Specialist

## 2018-06-25 NOTE — Telephone Encounter (Signed)
Called to inform patient right TKA will be canceled for 06/28/18 due to needing dental work for cavities. Patient states he is being worked in as a new patient at a dental office today ( 06/25/18) and trying to have fillings done today. Informed patient Dr. Nitka's surgery scheduler will be in contact with him to reschedule and verify dates of fillings to start over the pre-op appointments.  Patient # [phone] 

## 2018-06-25 NOTE — Telephone Encounter (Signed)
Patient was advised in office today that surgery has been cancelled.

## 2018-06-25 NOTE — Telephone Encounter (Signed)
Patient called stated he wanted to speak to you in regards in having dental work prior to surgery.  Please call patient as he has information on dental work.  [phone]

## 2018-06-25 NOTE — Telephone Encounter (Signed)
Called to inform patient right TKA will be canceled for 06/28/18 due to needing dental work for cavities. Patient states he is being worked in as a new patient at a [facility] office today ( 06/25/18) and trying to have fillings done today. Informed patient Dr. Barbaraann FasterNitka's surgery scheduler will be in contact with him to reschedule and verify dates of fillings to start over the pre-op appointments.  Patient # [phone]

## 2018-06-25 NOTE — Telephone Encounter (Signed)
Patient came into office this afternoon, said he went to a dentist today.  He was evaluated and found to only have one small cavity.  The dentist could not fix it, and said it was very tiny, but it was on a tooth that already has a large filling in it, and if they tried to fix it he would not have enough tooth to attach a crown.  They referred him to an oral surgeon to discuss options.  I have told him we would need a letter of clearance from his dentist/oral surgeon.   I advised him that there is no way to do surgery as previously planned at this point, it has been cancelled already.   He is upset that he did not know this about his teeth, that he needed this addressed prior to a total joint.  He says he only asked about it because he saw it in the paperwork [facility]. He has really had to do a ton of paperwork and prep for this surgery, and is upset it is not happening as planned.

## 2018-06-28 ENCOUNTER — Telehealth (INDEPENDENT_AMBULATORY_CARE_PROVIDER_SITE_OTHER): Payer: Self-pay | Admitting: Radiology

## 2018-06-28 ENCOUNTER — Inpatient Hospital Stay (HOSPITAL_COMMUNITY)
Admission: RE | Admit: 2018-06-28 | Payer: Managed Care, Other (non HMO) | Source: Ambulatory Visit | Admitting: Specialist

## 2018-06-28 ENCOUNTER — Encounter (HOSPITAL_COMMUNITY): Admission: RE | Payer: Self-pay | Source: Ambulatory Visit

## 2018-06-28 SURGERY — ARTHROPLASTY, KNEE, TOTAL
Anesthesia: General | Laterality: Right

## 2018-06-28 NOTE — Telephone Encounter (Signed)
I s/w Dr Otelia SergeantNitka and he advised me to fax a clearance letter to Dr Geoffery LyonsHerbert McNeil's office, and then we can proceed with surgery if patient is cleared.  I called and LM at Dr Mickie KayMcNeil's office for them to call me with their fax number.  Ph [phone], patient updated and advised we will get back with him ASAP.

## 2018-06-29 NOTE — Telephone Encounter (Signed)
Clearance request faxed to Dois DavenportSandra Public affairs consultant(office manager) at [phone].  It should come back attn to you.

## 2018-07-02 ENCOUNTER — Telehealth (INDEPENDENT_AMBULATORY_CARE_PROVIDER_SITE_OTHER): Payer: Self-pay | Admitting: Radiology

## 2018-07-02 NOTE — Telephone Encounter (Signed)
Jill AlexandersJustin was calling to get info regarding patients surgery, I called him back and left message to advise that the surgery had been cancelled due to patient needing dental work, and that he would have to be cleared to oral surgeon before we can do surgery.

## 2018-07-02 NOTE — Telephone Encounter (Signed)
Spoke with patient yesterday.  He is going to oral surgeon today to get the problem tooth taken care of.  He will call me back once all work done and he is cleared to proceed with surgery.

## 2018-07-15 NOTE — Pre-Procedure Instructions (Addendum)
Joseph FarberRobert M [name]  07/15/2018      [facility] - [place] - [address] [place] [id number] Phone: [phone] Fax: [phone]  [facility] [phone] [place] - [address] [place] [address] Phone: [phone] Fax: [phone]  [facility] - [place] - [address] [place] [id number] Phone: [phone] Fax: [phone]    Your procedure is scheduled on Aug.26  [name] at 5:30A.M.  Call this number if you have problems the morning of surgery:  [phone]   Remember:  Do not eat or drink after midnight.                      Take these medicines the morning of surgery with A SIP OF WATER :              Tylenol if needed              Albuterol inhaler--bring to [name]              symbicort--bring to [name]              Hydrocodone if needed              Methocarbamol (robaxin) if needed                              7 days prior to surgery STOP taking any Aspirin(unless otherwise instructed by your surgeon), Aleve, Naproxen, Ibuprofen, Motrin, Advil, Goody's, BC's, all herbal medications, fish oil, and all vitamins    Do not wear jewelry.  Do not wear lotions, powders, or perfumes, or deodorant.  Do not shave 48 hours prior to surgery.  Men may shave face and neck.  Do not bring valuables to the [name].  [name] is not responsible for any belongings or valuables.  Contacts, dentures or bridgework may not be worn into surgery.  Leave your suitcase in the car.  After surgery it may be brought to your room.  For patients admitted to the [name], discharge time will be determined by your treatment team.  Patients discharged the day of surgery will not be allowed to drive home.    Special instructions:  [name]- Preparing For Surgery  Before surgery, you can play an  important role. Because skin is not sterile, your skin needs to be as free of germs as possible. You can reduce the number of germs on your skin by washing with CHG (chlorahexidine gluconate) Soap before surgery.  CHG is an antiseptic cleaner which kills germs and bonds with the skin to continue killing germs even after washing.    Oral Hygiene is also important to reduce your risk of infection.  Remember - BRUSH YOUR TEETH THE MORNING OF SURGERY WITH YOUR REGULAR TOOTHPASTE  Please do not use if you have an allergy to CHG or antibacterial soaps. If your skin becomes reddened/irritated stop using the CHG.  Do not shave (including legs and underarms) for at least 48 hours prior to first CHG shower. It is OK to shave your face.  Please follow these instructions carefully.   1. Shower the NIGHT BEFORE SURGERY and the MORNING OF SURGERY with CHG.   2. If you chose to wash your hair, wash your hair  first as usual with your normal shampoo.  3. After you shampoo, rinse your hair and body thoroughly to remove the shampoo.  4. Use CHG as you would any other liquid soap. You can apply CHG directly to the skin and wash gently with a scrungie or a clean washcloth.   5. Apply the CHG Soap to your body ONLY FROM THE NECK DOWN.  Do not use on open wounds or open sores. Avoid contact with your eyes, ears, mouth and genitals (private parts). Wash Face and genitals (private parts)  with your normal soap.  6. Wash thoroughly, paying special attention to the area where your surgery will be performed.  7. Thoroughly rinse your body with warm water from the neck down.  8. DO NOT shower/wash with your normal soap after using and rinsing off the CHG Soap.  9. Pat yourself dry with a CLEAN TOWEL.  10. Wear CLEAN PAJAMAS to bed the night before surgery, wear comfortable clothes the morning of surgery  11. Place CLEAN SHEETS on your bed the night of your first shower and DO NOT SLEEP WITH PETS.    Day of  Surgery:  Do not apply any deodorants/lotions.  Please wear clean clothes to the [name]/surgery center.   Remember to brush your teeth WITH YOUR REGULAR TOOTHPASTE.    Please read over the following fact sheets that you were given. Coughing and Deep Breathing, MRSA Information and Surgical Site Infection Prevention

## 2018-07-16 ENCOUNTER — Encounter (HOSPITAL_COMMUNITY)
Admission: RE | Admit: 2018-07-16 | Discharge: 2018-07-16 | Disposition: A | Discharge status: Home / Self Care | Payer: Managed Care, Other (non HMO) | Source: Ambulatory Visit | Attending: Specialist | Admitting: Specialist

## 2018-07-16 ENCOUNTER — Other Ambulatory Visit: Payer: Self-pay

## 2018-07-16 ENCOUNTER — Encounter (HOSPITAL_COMMUNITY): Payer: Self-pay

## 2018-07-16 DIAGNOSIS — Z01812 Encounter for preprocedural laboratory examination: Secondary | ICD-10-CM | POA: Diagnosis not present

## 2018-07-16 DIAGNOSIS — Z01818 Encounter for other preprocedural examination: Secondary | ICD-10-CM | POA: Insufficient documentation

## 2018-07-16 DIAGNOSIS — Z0183 Encounter for blood typing: Secondary | ICD-10-CM | POA: Insufficient documentation

## 2018-07-16 LAB — URINALYSIS, ROUTINE W REFLEX MICROSCOPIC
BILIRUBIN URINE: NEGATIVE
GLUCOSE, UA: NEGATIVE mg/dL
Hgb urine dipstick: NEGATIVE
KETONES UR: NEGATIVE mg/dL
Leukocytes, UA: NEGATIVE
NITRITE: NEGATIVE
PROTEIN: NEGATIVE mg/dL
Specific Gravity, Urine: 1.031 — ABNORMAL HIGH (ref 1.005–1.030)
pH: 5 (ref 5.0–8.0)

## 2018-07-16 LAB — PROTIME-INR
INR: 0.96
PROTHROMBIN TIME: 12.7 s (ref 11.4–15.2)

## 2018-07-16 LAB — COMPREHENSIVE METABOLIC PANEL
ALT: 27 U/L (ref 0–44)
AST: 27 U/L (ref 15–41)
Albumin: 3.9 g/dL (ref 3.5–5.0)
Alkaline Phosphatase: 77 U/L (ref 38–126)
Anion gap: 8 (ref 5–15)
BUN: 18 mg/dL (ref 8–23)
CO2: 22 mmol/L (ref 22–32)
Calcium: 9.4 mg/dL (ref 8.9–10.3)
Chloride: 110 mmol/L (ref 98–111)
Creatinine, Ser: 1.27 mg/dL — ABNORMAL HIGH (ref 0.61–1.24)
GFR, EST NON AFRICAN AMERICAN: 58 mL/min — AB (ref >60)
Glucose, Bld: 97 mg/dL (ref 70–99)
POTASSIUM: 4.4 mmol/L (ref 3.5–5.1)
Sodium: 140 mmol/L (ref 135–145)
Total Bilirubin: 1 mg/dL (ref 0.3–1.2)
Total Protein: 6.5 g/dL (ref 6.5–8.1)

## 2018-07-16 LAB — CBC
HCT: 44.9 % (ref 39.0–52.0)
Hemoglobin: 14.2 g/dL (ref 13.0–17.0)
MCH: 29.8 pg (ref 26.0–34.0)
MCHC: 31.6 g/dL (ref 30.0–36.0)
MCV: 94.3 fL (ref 78.0–100.0)
PLATELETS: 205 10*3/uL (ref 150–400)
RBC: 4.76 MIL/uL (ref 4.22–5.81)
RDW: 12.5 % (ref 11.5–15.5)
WBC: 5.1 10*3/uL (ref 4.0–10.5)

## 2018-07-16 LAB — SURGICAL PCR SCREEN
MRSA, PCR: NEGATIVE
Staphylococcus aureus: NEGATIVE

## 2018-07-16 LAB — TYPE AND SCREEN
ABO/RH(D): A POS
ANTIBODY SCREEN: NEGATIVE

## 2018-07-16 LAB — APTT: aPTT: 28 seconds (ref 24–36)

## 2018-07-16 LAB — ABO/RH: ABO/RH(D): A POS

## 2018-07-16 NOTE — Progress Notes (Signed)
PCP - Oliver BarreJohn JAmes Chest x-ray - 01/07/18 EKG - 01/07/18  Denies cardiac workup or cardiac hx.  Pt previously cancelled d/t multiple cavities. Pt got second opinion on cavities and found to have only one cavity. This cavity was filled.   Patient denies shortness of breath, fever, cough and chest pain at PAT appointment   Patient verbalized understanding of instructions that were given to them at the PAT appointment. Patient was also instructed that they will need to review over the PAT instructions again at home before surgery.

## 2018-07-20 ENCOUNTER — Other Ambulatory Visit (INDEPENDENT_AMBULATORY_CARE_PROVIDER_SITE_OTHER): Payer: Self-pay

## 2018-07-26 ENCOUNTER — Inpatient Hospital Stay (HOSPITAL_COMMUNITY): Payer: Managed Care, Other (non HMO) | Admitting: Anesthesiology

## 2018-07-26 ENCOUNTER — Encounter (HOSPITAL_COMMUNITY): Payer: Self-pay | Admitting: *Deleted

## 2018-07-26 ENCOUNTER — Inpatient Hospital Stay (HOSPITAL_COMMUNITY)
Admission: RE | Admit: 2018-07-26 | Discharge: 2018-07-29 | DRG: 470 | Disposition: A | Discharge status: Home / Self Care | Payer: Managed Care, Other (non HMO) | Source: Ambulatory Visit | Attending: Specialist | Admitting: Specialist

## 2018-07-26 ENCOUNTER — Encounter (HOSPITAL_COMMUNITY)
Admission: RE | Disposition: A | Discharge status: Home / Self Care | Payer: Self-pay | Source: Ambulatory Visit | Attending: Specialist

## 2018-07-26 DIAGNOSIS — M25751 Osteophyte, right hip: Secondary | ICD-10-CM | POA: Diagnosis present

## 2018-07-26 DIAGNOSIS — D631 Anemia in chronic kidney disease: Secondary | ICD-10-CM | POA: Diagnosis present

## 2018-07-26 DIAGNOSIS — K219 Gastro-esophageal reflux disease without esophagitis: Secondary | ICD-10-CM | POA: Diagnosis present

## 2018-07-26 DIAGNOSIS — Z23 Encounter for immunization: Secondary | ICD-10-CM | POA: Diagnosis not present

## 2018-07-26 DIAGNOSIS — I951 Orthostatic hypotension: Secondary | ICD-10-CM | POA: Diagnosis not present

## 2018-07-26 DIAGNOSIS — M1711 Unilateral primary osteoarthritis, right knee: Secondary | ICD-10-CM | POA: Diagnosis present

## 2018-07-26 DIAGNOSIS — Z87891 Personal history of nicotine dependence: Secondary | ICD-10-CM | POA: Diagnosis not present

## 2018-07-26 DIAGNOSIS — D62 Acute posthemorrhagic anemia: Secondary | ICD-10-CM | POA: Diagnosis not present

## 2018-07-26 DIAGNOSIS — I129 Hypertensive chronic kidney disease with stage 1 through stage 4 chronic kidney disease, or unspecified chronic kidney disease: Secondary | ICD-10-CM | POA: Diagnosis present

## 2018-07-26 DIAGNOSIS — I82409 Acute embolism and thrombosis of unspecified deep veins of unspecified lower extremity: Secondary | ICD-10-CM

## 2018-07-26 DIAGNOSIS — Z79899 Other long term (current) drug therapy: Secondary | ICD-10-CM | POA: Diagnosis not present

## 2018-07-26 DIAGNOSIS — I82491 Acute embolism and thrombosis of other specified deep vein of right lower extremity: Secondary | ICD-10-CM | POA: Diagnosis not present

## 2018-07-26 DIAGNOSIS — Z7951 Long term (current) use of inhaled steroids: Secondary | ICD-10-CM

## 2018-07-26 DIAGNOSIS — I10 Essential (primary) hypertension: Secondary | ICD-10-CM | POA: Diagnosis present

## 2018-07-26 DIAGNOSIS — Z6831 Body mass index (BMI) 31.0-31.9, adult: Secondary | ICD-10-CM | POA: Diagnosis not present

## 2018-07-26 DIAGNOSIS — E669 Obesity, unspecified: Secondary | ICD-10-CM | POA: Diagnosis present

## 2018-07-26 DIAGNOSIS — R7302 Impaired glucose tolerance (oral): Secondary | ICD-10-CM | POA: Diagnosis present

## 2018-07-26 DIAGNOSIS — Z96651 Presence of right artificial knee joint: Secondary | ICD-10-CM

## 2018-07-26 DIAGNOSIS — N183 Chronic kidney disease, stage 3 (moderate): Secondary | ICD-10-CM | POA: Diagnosis present

## 2018-07-26 DIAGNOSIS — M7989 Other specified soft tissue disorders: Secondary | ICD-10-CM | POA: Diagnosis not present

## 2018-07-26 DIAGNOSIS — J309 Allergic rhinitis, unspecified: Secondary | ICD-10-CM | POA: Diagnosis present

## 2018-07-26 DIAGNOSIS — M79609 Pain in unspecified limb: Secondary | ICD-10-CM | POA: Diagnosis not present

## 2018-07-26 DIAGNOSIS — E785 Hyperlipidemia, unspecified: Secondary | ICD-10-CM | POA: Diagnosis present

## 2018-07-26 HISTORY — PX: TOTAL KNEE ARTHROPLASTY: SHX125

## 2018-07-26 SURGERY — ARTHROPLASTY, KNEE, TOTAL
Anesthesia: Monitor Anesthesia Care | Site: Knee | Laterality: Right

## 2018-07-26 MED ORDER — METOCLOPRAMIDE HCL 5 MG/ML IJ SOLN
5.0000 mg | Freq: Three times a day (TID) | INTRAMUSCULAR | Status: DC | PRN
  Administered 2018-07-27 – 2018-07-28 (×2): 10 mg via INTRAVENOUS
  Filled 2018-07-26 (×2): qty 2

## 2018-07-26 MED ORDER — BUPIVACAINE LIPOSOME 1.3 % IJ SUSP
20.0000 mL | INTRAMUSCULAR | Status: AC
  Administered 2018-07-26: 10 mL
  Filled 2018-07-26: qty 20

## 2018-07-26 MED ORDER — DIPHENHYDRAMINE HCL 12.5 MG/5ML PO ELIX: 12.5000 mg | ORAL_SOLUTION | ORAL | Status: DC | PRN

## 2018-07-26 MED ORDER — ONDANSETRON HCL 4 MG/2ML IJ SOLN
INTRAMUSCULAR | Status: AC
  Filled 2018-07-26: qty 2

## 2018-07-26 MED ORDER — POLYETHYLENE GLYCOL 3350 17 G PO PACK: 17.0000 g | PACK | Freq: Every day | ORAL | Status: DC | PRN

## 2018-07-26 MED ORDER — OXYCODONE HCL 5 MG PO TABS
5.0000 mg | ORAL_TABLET | ORAL | Status: DC | PRN
  Administered 2018-07-27 – 2018-07-28 (×6): 10 mg via ORAL
  Filled 2018-07-26 (×5): qty 2

## 2018-07-26 MED ORDER — OXYCODONE HCL 5 MG/5ML PO SOLN: 5.0000 mg | Freq: Once | ORAL | Status: DC | PRN

## 2018-07-26 MED ORDER — BISACODYL 5 MG PO TBEC: 5.0000 mg | DELAYED_RELEASE_TABLET | Freq: Every day | ORAL | Status: DC | PRN

## 2018-07-26 MED ORDER — BUPIVACAINE HCL 0.5 % IJ SOLN
INTRAMUSCULAR | Status: DC | PRN
  Administered 2018-07-26: 10 mL

## 2018-07-26 MED ORDER — MENTHOL 3 MG MT LOZG: 1.0000 | LOZENGE | OROMUCOSAL | Status: DC | PRN

## 2018-07-26 MED ORDER — OXYCODONE HCL ER 10 MG PO T12A
10.0000 mg | EXTENDED_RELEASE_TABLET | Freq: Two times a day (BID) | ORAL | Status: DC
  Administered 2018-07-26 – 2018-07-27 (×3): 10 mg via ORAL
  Filled 2018-07-26 (×3): qty 1

## 2018-07-26 MED ORDER — CEFAZOLIN SODIUM-DEXTROSE 2-4 GM/100ML-% IV SOLN
2.0000 g | INTRAVENOUS | Status: AC
  Administered 2018-07-26: 2 g via INTRAVENOUS
  Filled 2018-07-26: qty 100

## 2018-07-26 MED ORDER — BUPIVACAINE HCL (PF) 0.5 % IJ SOLN
INTRAMUSCULAR | Status: AC
  Filled 2018-07-26: qty 30

## 2018-07-26 MED ORDER — PANTOPRAZOLE SODIUM 40 MG PO TBEC
80.0000 mg | DELAYED_RELEASE_TABLET | Freq: Every day | ORAL | Status: DC
  Administered 2018-07-27 – 2018-07-29 (×3): 80 mg via ORAL
  Filled 2018-07-26 (×3): qty 2

## 2018-07-26 MED ORDER — PHENYLEPHRINE 40 MCG/ML (10ML) SYRINGE FOR IV PUSH (FOR BLOOD PRESSURE SUPPORT)
PREFILLED_SYRINGE | INTRAVENOUS | Status: AC
  Filled 2018-07-26: qty 10

## 2018-07-26 MED ORDER — EPHEDRINE SULFATE 50 MG/ML IJ SOLN
INTRAMUSCULAR | Status: DC | PRN
  Administered 2018-07-26 (×5): 10 mg via INTRAVENOUS

## 2018-07-26 MED ORDER — ROPIVACAINE HCL 7.5 MG/ML IJ SOLN
INTRAMUSCULAR | Status: DC | PRN
  Administered 2018-07-26: 20 mL via PERINEURAL

## 2018-07-26 MED ORDER — PHENOL 1.4 % MT LIQD: 1.0000 | OROMUCOSAL | Status: DC | PRN

## 2018-07-26 MED ORDER — MIDAZOLAM HCL 2 MG/2ML IJ SOLN
INTRAMUSCULAR | Status: AC
  Filled 2018-07-26: qty 2

## 2018-07-26 MED ORDER — KETOROLAC TROMETHAMINE 15 MG/ML IJ SOLN
7.5000 mg | Freq: Four times a day (QID) | INTRAMUSCULAR | Status: AC
  Administered 2018-07-26 – 2018-07-27 (×4): 7.5 mg via INTRAVENOUS
  Filled 2018-07-26 (×4): qty 1

## 2018-07-26 MED ORDER — ACETAMINOPHEN 325 MG PO TABS: 325.0000 mg | ORAL_TABLET | Freq: Four times a day (QID) | ORAL | Status: DC | PRN

## 2018-07-26 MED ORDER — EPHEDRINE 5 MG/ML INJ
INTRAVENOUS | Status: AC
  Filled 2018-07-26: qty 10

## 2018-07-26 MED ORDER — PROPOFOL 500 MG/50ML IV EMUL
INTRAVENOUS | Status: DC | PRN
  Administered 2018-07-26: 25 ug/kg/min via INTRAVENOUS

## 2018-07-26 MED ORDER — ONDANSETRON HCL 4 MG PO TABS
4.0000 mg | ORAL_TABLET | Freq: Four times a day (QID) | ORAL | Status: DC | PRN
  Administered 2018-07-26: 4 mg via ORAL
  Filled 2018-07-26: qty 1

## 2018-07-26 MED ORDER — BUPIVACAINE IN DEXTROSE 0.75-8.25 % IT SOLN
INTRATHECAL | Status: DC | PRN
  Administered 2018-07-26: 2 mL via INTRATHECAL

## 2018-07-26 MED ORDER — PROPOFOL 10 MG/ML IV BOLUS
INTRAVENOUS | Status: DC | PRN
  Administered 2018-07-26: 20 mg via INTRAVENOUS

## 2018-07-26 MED ORDER — PHENYLEPHRINE HCL 10 MG/ML IJ SOLN
INTRAMUSCULAR | Status: DC | PRN
  Administered 2018-07-26 (×5): 80 ug via INTRAVENOUS

## 2018-07-26 MED ORDER — LIDOCAINE 2% (20 MG/ML) 5 ML SYRINGE
INTRAMUSCULAR | Status: AC
  Filled 2018-07-26: qty 5

## 2018-07-26 MED ORDER — MOMETASONE FURO-FORMOTEROL FUM 200-5 MCG/ACT IN AERO
2.0000 | INHALATION_SPRAY | Freq: Two times a day (BID) | RESPIRATORY_TRACT | Status: DC
  Administered 2018-07-27 – 2018-07-29 (×5): 2 via RESPIRATORY_TRACT
  Filled 2018-07-26: qty 8.8

## 2018-07-26 MED ORDER — ONDANSETRON HCL 4 MG/2ML IJ SOLN
4.0000 mg | Freq: Four times a day (QID) | INTRAMUSCULAR | Status: DC | PRN
  Administered 2018-07-26 – 2018-07-29 (×6): 4 mg via INTRAVENOUS
  Filled 2018-07-26 (×6): qty 2

## 2018-07-26 MED ORDER — METOCLOPRAMIDE HCL 5 MG PO TABS: 5.0000 mg | ORAL_TABLET | Freq: Three times a day (TID) | ORAL | Status: DC | PRN

## 2018-07-26 MED ORDER — DEXAMETHASONE SODIUM PHOSPHATE 10 MG/ML IJ SOLN
INTRAMUSCULAR | Status: DC | PRN
  Administered 2018-07-26: 10 mg via INTRAVENOUS

## 2018-07-26 MED ORDER — CHLORHEXIDINE GLUCONATE 4 % EX LIQD: 60.0000 mL | Freq: Once | CUTANEOUS | Status: DC

## 2018-07-26 MED ORDER — ASPIRIN EC 325 MG PO TBEC
325.0000 mg | DELAYED_RELEASE_TABLET | Freq: Every day | ORAL | Status: DC
  Administered 2018-07-27 – 2018-07-28 (×2): 325 mg via ORAL
  Filled 2018-07-26 (×2): qty 1

## 2018-07-26 MED ORDER — ALBUTEROL SULFATE (2.5 MG/3ML) 0.083% IN NEBU: 2.5000 mg | INHALATION_SOLUTION | Freq: Four times a day (QID) | RESPIRATORY_TRACT | Status: DC | PRN

## 2018-07-26 MED ORDER — HYDROMORPHONE HCL 1 MG/ML IJ SOLN
0.5000 mg | INTRAMUSCULAR | Status: DC | PRN
  Administered 2018-07-26 – 2018-07-28 (×2): 1 mg via INTRAVENOUS
  Filled 2018-07-26 (×2): qty 1

## 2018-07-26 MED ORDER — GABAPENTIN 300 MG PO CAPS
300.0000 mg | ORAL_CAPSULE | Freq: Three times a day (TID) | ORAL | Status: DC
  Administered 2018-07-26 – 2018-07-29 (×9): 300 mg via ORAL
  Filled 2018-07-26 (×10): qty 1

## 2018-07-26 MED ORDER — 0.9 % SODIUM CHLORIDE (POUR BTL) OPTIME
TOPICAL | Status: DC | PRN
  Administered 2018-07-26: 1000 mL

## 2018-07-26 MED ORDER — OXYCODONE HCL 5 MG PO TABS
10.0000 mg | ORAL_TABLET | ORAL | Status: DC | PRN
  Administered 2018-07-26: 10 mg via ORAL
  Filled 2018-07-26 (×3): qty 2

## 2018-07-26 MED ORDER — DOCUSATE SODIUM 100 MG PO CAPS
100.0000 mg | ORAL_CAPSULE | Freq: Two times a day (BID) | ORAL | Status: DC
  Administered 2018-07-26 – 2018-07-29 (×6): 100 mg via ORAL
  Filled 2018-07-26 (×6): qty 1

## 2018-07-26 MED ORDER — OXYCODONE HCL 5 MG PO TABS: 5.0000 mg | ORAL_TABLET | Freq: Once | ORAL | Status: DC | PRN

## 2018-07-26 MED ORDER — SODIUM CHLORIDE 0.9 % IR SOLN
Status: DC | PRN
  Administered 2018-07-26: 3000 mL

## 2018-07-26 MED ORDER — LOSARTAN POTASSIUM 50 MG PO TABS
50.0000 mg | ORAL_TABLET | Freq: Every day | ORAL | Status: DC
  Administered 2018-07-27 – 2018-07-29 (×3): 50 mg via ORAL
  Filled 2018-07-26 (×3): qty 1

## 2018-07-26 MED ORDER — GABAPENTIN 300 MG PO CAPS: 300.0000 mg | ORAL_CAPSULE | Freq: Two times a day (BID) | ORAL | Status: DC

## 2018-07-26 MED ORDER — MIDAZOLAM HCL 5 MG/5ML IJ SOLN
INTRAMUSCULAR | Status: DC | PRN
  Administered 2018-07-26 (×2): 2 mg via INTRAVENOUS

## 2018-07-26 MED ORDER — SODIUM CHLORIDE 0.9 % IV SOLN
INTRAVENOUS | Status: DC
  Administered 2018-07-26 – 2018-07-27 (×3): via INTRAVENOUS

## 2018-07-26 MED ORDER — FLEET ENEMA 7-19 GM/118ML RE ENEM: 1.0000 | ENEMA | Freq: Once | RECTAL | Status: DC | PRN

## 2018-07-26 MED ORDER — FENTANYL CITRATE (PF) 250 MCG/5ML IJ SOLN
INTRAMUSCULAR | Status: AC
  Filled 2018-07-26: qty 5

## 2018-07-26 MED ORDER — HYDROMORPHONE HCL 1 MG/ML IJ SOLN: 0.2500 mg | INTRAMUSCULAR | Status: DC | PRN

## 2018-07-26 MED ORDER — FENTANYL CITRATE (PF) 100 MCG/2ML IJ SOLN
INTRAMUSCULAR | Status: DC | PRN
  Administered 2018-07-26 (×3): 50 ug via INTRAVENOUS

## 2018-07-26 MED ORDER — PROPOFOL 10 MG/ML IV BOLUS
INTRAVENOUS | Status: AC
  Filled 2018-07-26: qty 40

## 2018-07-26 MED ORDER — LACTATED RINGERS IV SOLN
INTRAVENOUS | Status: DC | PRN
  Administered 2018-07-26 (×2): via INTRAVENOUS

## 2018-07-26 MED ORDER — DEXAMETHASONE SODIUM PHOSPHATE 10 MG/ML IJ SOLN
INTRAMUSCULAR | Status: AC
  Filled 2018-07-26: qty 1

## 2018-07-26 MED ORDER — ONDANSETRON HCL 4 MG/2ML IJ SOLN
INTRAMUSCULAR | Status: DC | PRN
  Administered 2018-07-26: 4 mg via INTRAVENOUS

## 2018-07-26 MED ORDER — ALUM & MAG HYDROXIDE-SIMETH 200-200-20 MG/5ML PO SUSP: 30.0000 mL | ORAL | Status: DC | PRN

## 2018-07-26 SURGICAL SUPPLY — 72 items
ATTUNE MED DOME PAT 38 KNEE (Knees) ×2 IMPLANT
ATTUNE MED DOME PAT 38MM KNEE (Knees) ×1 IMPLANT
ATTUNE PS FEM RT SZ 8 CEM KNEE (Femur) ×3 IMPLANT
ATTUNE PSRP INSR SZ8 5 KNEE (Insert) ×2 IMPLANT
ATTUNE PSRP INSR SZ8 5MM KNEE (Insert) ×1 IMPLANT
BANDAGE ACE 4X5 VEL STRL LF (GAUZE/BANDAGES/DRESSINGS) ×3 IMPLANT
BANDAGE ACE 6X5 VEL STRL LF (GAUZE/BANDAGES/DRESSINGS) ×3 IMPLANT
BANDAGE ESMARK 6X9 LF (GAUZE/BANDAGES/DRESSINGS) ×1 IMPLANT
BASE TIBIAL ROT PLAT SZ 8 KNEE (Knees) ×1 IMPLANT
BENZOIN TINCTURE PRP APPL 2/3 (GAUZE/BANDAGES/DRESSINGS) ×3 IMPLANT
BLADE SAG 18X100X1.27 (BLADE) ×6 IMPLANT
BLADE SAW SGTL 13X75X1.27 (BLADE) ×3 IMPLANT
BNDG ELASTIC 6X10 VLCR STRL LF (GAUZE/BANDAGES/DRESSINGS) ×3 IMPLANT
BNDG ESMARK 6X9 LF (GAUZE/BANDAGES/DRESSINGS) ×3
BOWL SMART MIX CTS (DISPOSABLE) ×3 IMPLANT
CEMENT HV SMART SET (Cement) ×6 IMPLANT
CLOSURE WOUND 1/2 X4 (GAUZE/BANDAGES/DRESSINGS)
COVER SURGICAL LIGHT HANDLE (MISCELLANEOUS) ×3 IMPLANT
CUFF TOURNIQUET SINGLE 34IN LL (TOURNIQUET CUFF) ×3 IMPLANT
CUFF TOURNIQUET SINGLE 44IN (TOURNIQUET CUFF) IMPLANT
DRAPE ORTHO SPLIT 77X108 STRL (DRAPES) ×4
DRAPE SURG ORHT 6 SPLT 77X108 (DRAPES) ×2 IMPLANT
DRAPE U-SHAPE 47X51 STRL (DRAPES) ×3 IMPLANT
DRSG ADAPTIC 3X8 NADH LF (GAUZE/BANDAGES/DRESSINGS) ×3 IMPLANT
DRSG AQUACEL AG ADV 3.5X14 (GAUZE/BANDAGES/DRESSINGS) ×3 IMPLANT
DRSG PAD ABDOMINAL 8X10 ST (GAUZE/BANDAGES/DRESSINGS) ×3 IMPLANT
DURAPREP 26ML APPLICATOR (WOUND CARE) ×3 IMPLANT
ELECT REM PT RETURN 9FT ADLT (ELECTROSURGICAL) ×3
ELECTRODE REM PT RTRN 9FT ADLT (ELECTROSURGICAL) ×1 IMPLANT
EVACUATOR 1/8 PVC DRAIN (DRAIN) IMPLANT
FACESHIELD WRAPAROUND (MASK) ×6 IMPLANT
GAUZE SPONGE 4X4 12PLY STRL (GAUZE/BANDAGES/DRESSINGS) IMPLANT
GLOVE BIOGEL PI IND STRL 8 (GLOVE) ×1 IMPLANT
GLOVE BIOGEL PI INDICATOR 8 (GLOVE) ×2
GLOVE ECLIPSE 9.0 STRL (GLOVE) ×3 IMPLANT
GLOVE ORTHO TXT STRL SZ7.5 (GLOVE) ×3 IMPLANT
GLOVE SURG 8.5 LATEX PF (GLOVE) ×3 IMPLANT
GOWN STRL REUS W/ TWL LRG LVL3 (GOWN DISPOSABLE) ×1 IMPLANT
GOWN STRL REUS W/TWL 2XL LVL3 (GOWN DISPOSABLE) ×6 IMPLANT
GOWN STRL REUS W/TWL LRG LVL3 (GOWN DISPOSABLE) ×2
HANDPIECE INTERPULSE COAX TIP (DISPOSABLE) ×2
IMMOBILIZER KNEE 22 UNIV (SOFTGOODS) ×3 IMPLANT
KIT BASIN OR (CUSTOM PROCEDURE TRAY) ×3 IMPLANT
KIT TURNOVER KIT B (KITS) ×3 IMPLANT
MANIFOLD NEPTUNE II (INSTRUMENTS) ×3 IMPLANT
NEEDLE HYPO 25X1 1.5 SAFETY (NEEDLE) IMPLANT
NS IRRIG 1000ML POUR BTL (IV SOLUTION) ×3 IMPLANT
PACK TOTAL JOINT (CUSTOM PROCEDURE TRAY) ×3 IMPLANT
PAD ARMBOARD 7.5X6 YLW CONV (MISCELLANEOUS) ×3 IMPLANT
PAD CAST 4YDX4 CTTN HI CHSV (CAST SUPPLIES) IMPLANT
PADDING CAST COTTON 4X4 STRL (CAST SUPPLIES)
PADDING CAST COTTON 6X4 STRL (CAST SUPPLIES) IMPLANT
SET HNDPC FAN SPRY TIP SCT (DISPOSABLE) ×1 IMPLANT
STAPLER VISISTAT 35W (STAPLE) IMPLANT
STRIP CLOSURE SKIN 1/2X4 (GAUZE/BANDAGES/DRESSINGS) IMPLANT
SUCTION FRAZIER HANDLE 10FR (MISCELLANEOUS)
SUCTION TUBE FRAZIER 10FR DISP (MISCELLANEOUS) IMPLANT
SUT BONE WAX W31G (SUTURE) IMPLANT
SUT VIC AB 0 CT1 27 (SUTURE) ×6
SUT VIC AB 0 CT1 27XBRD ANBCTR (SUTURE) ×3 IMPLANT
SUT VIC AB 1 CT1 27 (SUTURE) ×4
SUT VIC AB 1 CT1 27XBRD ANBCTR (SUTURE) ×2 IMPLANT
SUT VIC AB 2-0 CT1 27 (SUTURE) ×6
SUT VIC AB 2-0 CT1 TAPERPNT 27 (SUTURE) ×3 IMPLANT
SUT VICRYL 4-0 PS2 18IN ABS (SUTURE) ×3 IMPLANT
SYR CONTROL 10ML LL (SYRINGE) IMPLANT
TIBIAL BASE ROT PLAT SZ 8 KNEE (Knees) ×3 IMPLANT
TOWEL OR 17X24 6PK STRL BLUE (TOWEL DISPOSABLE) ×3 IMPLANT
TOWEL OR 17X26 10 PK STRL BLUE (TOWEL DISPOSABLE) ×3 IMPLANT
TRAY CATH 16FR W/PLASTIC CATH (SET/KITS/TRAYS/PACK) IMPLANT
TRAY FOLEY CATH SILVER 16FR (SET/KITS/TRAYS/PACK) IMPLANT
WATER STERILE IRR 1000ML POUR (IV SOLUTION) IMPLANT

## 2018-07-26 NOTE — Progress Notes (Signed)
Patient due to void by 1700 per pacu report.  Patient bladder scanned for 25mL urine.  No bladder distention noted upon palpation.  Will continue to monitor.

## 2018-07-26 NOTE — Interval H&P Note (Signed)
History and Physical Interval Note:  07/26/2018 7:33 AM  [name]  has presented today for surgery, with the diagnosis of right knee arthritis  The various methods of treatment have been discussed with the patient and family. After consideration of risks, benefits and other options for treatment, the patient has consented to  Procedure(s): RIGHT TOTAL KNEE ARTHROPLASTY (Right) as a surgical intervention .  The patient's history has been reviewed, patient examined, no change in status, stable for surgery.  I have reviewed the patient's chart and labs.  Questions were answered to the patient's satisfaction.     Vira BrownsJames Nitka

## 2018-07-26 NOTE — Transfer of Care (Signed)
Immediate Anesthesia Transfer of Care Note  Patient: [name]  Procedure(s) Performed: RIGHT TOTAL KNEE ARTHROPLASTY (Right Knee)  Patient Location: PACU  Anesthesia Type:MAC, Regional and Spinal  Level of Consciousness: awake, alert , oriented and sedated  Airway & Oxygen Therapy: Patient Spontanous Breathing and Patient connected to face mask oxygen  Post-op Assessment: Report given to RN, Post -op Vital signs reviewed and stable and Patient moving all extremities  Post vital signs: Reviewed and stable  Last Vitals:  Vitals Value Taken Time  BP 93/53 07/26/2018 11:00 AM  Temp    Pulse 68 07/26/2018 11:02 AM  Resp 10 07/26/2018 11:02 AM  SpO2 98 % 07/26/2018 11:02 AM  Vitals shown include unvalidated device data.  Last Pain:  Vitals:   07/26/18 0605  TempSrc:   PainSc: 0-No pain         Complications: No apparent anesthesia complications

## 2018-07-26 NOTE — Anesthesia Procedure Notes (Signed)
Anesthesia Regional Block: Adductor canal block   Pre-Anesthetic Checklist: ,, timeout performed, Correct Patient, Correct Site, Correct Laterality, Correct Procedure, Correct Position, site marked, Risks and benefits discussed,  Surgical consent,  Pre-op evaluation,  At surgeon's request and post-op pain management  Laterality: Lower and Right  Prep: chloraprep       Needles:  Injection technique: Single-shot  Needle Type: Echogenic Stimulator Needle          Additional Needles:   Procedures:,,,, ultrasound used (permanent image in chart),,,,  Narrative:  Start time: 07/26/2018 7:18 AM End time: 07/26/2018 7:23 AM Injection made incrementally with aspirations every 5 mL.  Performed by: Personally  Anesthesiologist: Val EagleMoser, Snyder Colavito, MD  Additional Notes: H+P and labs reviewed, risks and benefits discussed with patient, procedure tolerated well without complications

## 2018-07-26 NOTE — Anesthesia Preprocedure Evaluation (Addendum)
Anesthesia Evaluation  Patient identified by MRN, date of birth, ID band Patient awake    Reviewed: Allergy & Precautions, NPO status , Patient's Chart, lab work & pertinent test results  History of Anesthesia Complications Negative for: history of anesthetic complications  Airway Mallampati: I  TM Distance: >3 FB Neck ROM: Full    Dental  (+) Teeth Intact   Pulmonary neg shortness of breath, asthma , neg sleep apnea, neg COPD, neg recent URI, former smoker,    breath sounds clear to auscultation       Cardiovascular hypertension, Pt. on medications  Rhythm:Regular     Neuro/Psych negative neurological ROS  negative psych ROS   GI/Hepatic Neg liver ROS, GERD  Medicated and Controlled,  Endo/Other  negative endocrine ROS  Renal/GU Renal InsufficiencyRenal disease  negative genitourinary   Musculoskeletal  (+) Arthritis ,   Abdominal   Peds negative pediatric ROS (+)  Hematology negative hematology ROS (+)   Anesthesia Other Findings   Reproductive/Obstetrics                             Anesthesia Physical Anesthesia Plan  ASA: II  Anesthesia Plan: MAC, Regional and Spinal   Post-op Pain Management:    Induction:   PONV Risk Score and Plan: 1 and Treatment may vary due to age or medical condition  Airway Management Planned: Nasal Cannula  Additional Equipment:   Intra-op Plan:   Post-operative Plan:   Informed Consent: I have reviewed the patients History and Physical, chart, labs and discussed the procedure including the risks, benefits and alternatives for the proposed anesthesia with the patient or authorized representative who has indicated his/her understanding and acceptance.   Dental advisory given  Plan Discussed with: CRNA and Surgeon  Anesthesia Plan Comments:         Anesthesia Quick Evaluation

## 2018-07-26 NOTE — Evaluation (Signed)
Physical Therapy Evaluation Patient Details Name: [name] MRN: [id number] DOB: Feb 09, 1951 Today's Date: 07/26/2018   History of Present Illness  Pt is a 67 y/o male s/p elective R TKA. PMH includes HTN.   Clinical Impression  Pt is s/p surgery above with deficits below. Mobility limited to chair this session as pt with increased lightheadedness and nausea. Checked BP and BP at 123/82 mmHg. Pt requiring min to min guard A for mobility using RW this session. Educated about knee precautions and supine HEP. Will continue to follow acutely to maximize functional mobility independence and safety.     Follow Up Recommendations Follow surgeon's recommendation for DC plan and follow-up therapies;Supervision for mobility/OOB    Equipment Recommendations  None recommended by PT    Recommendations for Other Services OT consult     Precautions / Restrictions Precautions Precautions: Knee Precaution Booklet Issued: Yes (comment) Precaution Comments: Reviewed knee precautions and supine HEP with pt.  Required Braces or Orthoses: Knee Immobilizer - Right Restrictions Weight Bearing Restrictions: Yes RLE Weight Bearing: Weight bearing as tolerated      Mobility  Bed Mobility Overal bed mobility: Needs Assistance Bed Mobility: Supine to Sit     Supine to sit: Supervision     General bed mobility comments: Supervision for safety. Increased time required.   Transfers Overall transfer level: Needs assistance Equipment used: Rolling walker (2 wheeled) Transfers: Sit to/from Stand Sit to Stand: Min assist         General transfer comment: Min A for lift assist and steadying. Verbal cues for safe hand placement.   Ambulation/Gait Ambulation/Gait assistance: Min assist;Min guard Gait Distance (Feet): 5 Feet Assistive device: Rolling walker (2 wheeled) Gait Pattern/deviations: Step-to pattern;Decreased step length - right;Decreased step length - left;Decreased weight shift to  left;Antalgic Gait velocity: Decreased    General Gait Details: Slow, antalgic gait. Required verbal cues for sequencing using RW. Pt with limited weightbearing on RLE. Reports increasing lightheadedness and nausea, so distance limited to chair. Checked BP and BP at 123/82 mmHg. Notified RN.   Stairs            Wheelchair Mobility    Modified Rankin (Stroke Patients Only)       Balance Overall balance assessment: Needs assistance Sitting-balance support: Feet supported;No upper extremity supported Sitting balance-Leahy Scale: Good     Standing balance support: Bilateral upper extremity supported;During functional activity Standing balance-Leahy Scale: Poor Standing balance comment: Reliant on BUE support                              Pertinent Vitals/Pain Pain Assessment: Faces Faces Pain Scale: Hurts even more Pain Location: R knee  Pain Descriptors / Indicators: Aching;Operative site guarding Pain Intervention(s): Limited activity within patient's tolerance;Monitored during session;Repositioned    Home Living Family/patient expects to be discharged to:: Private residence Living Arrangements: Spouse/significant other Available Help at Discharge: Family;Available 24 hours/day Type of Home: House Home Access: Stairs to enter Entrance Stairs-Rails: None Entrance Stairs-Number of Steps: 2 Home Layout: One level Home Equipment: Walker - 2 wheels;Bedside commode      Prior Function Level of Independence: Independent         Comments: Was working as a Dance movement psychotherapist        Extremity/Trunk Assessment   Upper Extremity Assessment Upper Extremity Assessment: Defer to OT evaluation    Lower Extremity Assessment Lower Extremity Assessment: RLE  deficits/detail RLE Deficits / Details: Deficits consistent with post op pain and weakness. Able to perform ther ex below.     Cervical / Trunk Assessment Cervical / Trunk Assessment:  Normal  Communication   Communication: No difficulties  Cognition Arousal/Alertness: Awake/alert Behavior During Therapy: WFL for tasks assessed/performed Overall Cognitive Status: Within Functional Limits for tasks assessed                                        General Comments      Exercises Total Joint Exercises Ankle Circles/Pumps: AROM;Both;20 reps;Supine Quad Sets: AROM;Right;10 reps;Supine Heel Slides: AROM;Right;10 reps;Supine   Assessment/Plan    PT Assessment Patient needs continued PT services  PT Problem List Decreased strength;Decreased range of motion;Decreased activity tolerance;Decreased balance;Decreased mobility;Decreased knowledge of use of DME;Decreased knowledge of precautions;Pain       PT Treatment Interventions DME instruction;Gait training;Stair training;Therapeutic activities;Therapeutic exercise;Functional mobility training;Balance training;Patient/family education    PT Goals (Current goals can be found in the Care Plan section)  Acute Rehab PT Goals Patient Stated Goal: to feel better  PT Goal Formulation: With patient Time For Goal Achievement: 08/09/18 Potential to Achieve Goals: Good    Frequency 7X/week   Barriers to discharge        Co-evaluation               AM-PAC PT "6 Clicks" Daily Activity  Outcome Measure Difficulty turning over in bed (including adjusting bedclothes, sheets and blankets)?: None Difficulty moving from lying on back to sitting on the side of the bed? : A Little Difficulty sitting down on and standing up from a chair with arms (e.g., wheelchair, bedside commode, etc,.)?: Unable Help needed moving to and from a bed to chair (including a wheelchair)?: A Little Help needed walking in hospital room?: A Little Help needed climbing 3-5 steps with a railing? : A Lot 6 Click Score: 16    End of Session Equipment Utilized During Treatment: Gait belt Activity Tolerance: Treatment limited  secondary to medical complications (Comment)(lightheadedness/nausea ) Patient left: in chair;with call bell/phone within reach Nurse Communication: Mobility status;Other (comment)(lightheadedness/nausea ) PT Visit Diagnosis: Unsteadiness on feet (R26.81);Muscle weakness (generalized) (M62.81);Pain Pain - Right/Left: Right Pain - part of body: Knee    Time: [id number] PT Time Calculation (min) (ACUTE ONLY): 24 min   Charges:   PT Evaluation $PT Eval Low Complexity: 1 Low PT Treatments $Therapeutic Activity: 8-22 mins       Gladys DammeBrittany Jjesus Dingley, PT, DPT  Acute Rehabilitation Services  Pager: [phone]   Lehman PromBrittany S Anjelo Pullman 07/26/2018, 2:37 PM

## 2018-07-26 NOTE — Plan of Care (Signed)

## 2018-07-26 NOTE — Discharge Instructions (Addendum)
° ° °Keep knee incision dry for 5 days post op then may wet while bathing. °Therapy daily and CPM goal full extension and greater than 90 degrees flexion. °Call if fever or chills or increased drainage. °Go to ER if acutely short of breath or call for ambulance. °Return for follow up in 2 weeks. °May full weight bear on the surgical leg unless told otherwise. °Use knee immobilizer until able to straight leg raise off bed with knee stable. °In house walking for first 2 weeks. °INSTRUCTIONS AFTER JOINT REPLACEMENT  ° °o Remove items at home which could result in a fall. This includes throw rugs or furniture in walking pathways °o ICE to the affected joint every three hours while awake for 30 minutes at a time, for at least the first 3-5 days, and then as needed for pain and swelling.  Continue to use ice for pain and swelling. You may notice swelling that will progress down to the foot and ankle.  This is normal after surgery.  Elevate your leg when you are not up walking on it.   °o Continue to use the breathing machine you got in the hospital (incentive spirometer) which will help keep your temperature down.  It is common for your temperature to cycle up and down following surgery, especially at night when you are not up moving around and exerting yourself.  The breathing machine keeps your lungs expanded and your temperature down. ° ° °DIET:  As you were doing prior to hospitalization, we recommend a well-balanced diet. ° °DRESSING / WOUND CARE / SHOWERING ° °You may change your dressing 3-5 days after surgery.  Then change the dressing every day with sterile gauze.  Please use good hand washing techniques before changing the dressing.  Do not use any lotions or creams on the incision until instructed by your surgeon. ° °ACTIVITY ° °o Increase activity slowly as tolerated, but follow the weight bearing instructions below.   °o No driving for 6 weeks or until further direction given by your physician.  You cannot  drive while taking narcotics.  °o No lifting or carrying greater than 10 lbs. until further directed by your surgeon. °o Avoid periods of inactivity such as sitting longer than an hour when not asleep. This helps prevent blood clots.  °o You may return to work once you are authorized by your doctor.  ° ° ° °WEIGHT BEARING  ° °Weight bearing as tolerated with assist device (walker, cane, etc) as directed, use it as long as suggested by your surgeon or therapist, typically at least 4-6 weeks. ° ° °EXERCISES ° °Results after joint replacement surgery are often greatly improved when you follow the exercise, range of motion and muscle strengthening exercises prescribed by your doctor. Safety measures are also important to protect the joint from further injury. Any time any of these exercises cause you to have increased pain or swelling, decrease what you are doing until you are comfortable again and then slowly increase them. If you have problems or questions, call your caregiver or physical therapist for advice.  ° °Rehabilitation is important following a joint replacement. After just a few days of immobilization, the muscles of the leg can become weakened and shrink (atrophy).  These exercises are designed to build up the tone and strength of the thigh and leg muscles and to improve motion. Often times heat used for twenty to thirty minutes before working out will loosen up your tissues and help with improving the range   of motion but do not use heat for the first two weeks following surgery (sometimes heat can increase post-operative swelling).  ° °These exercises can be done on a training (exercise) mat, on the floor, on a table or on a bed. Use whatever works the best and is most comfortable for you.    Use music or television while you are exercising so that the exercises are a pleasant break in your day. This will make your life better with the exercises acting as a break in your routine that you can look forward  to.   Perform all exercises about fifteen times, three times per day or as directed.  You should exercise both the operative leg and the other leg as well. ° °Exercises include: °  °• Quad Sets - Tighten up the muscle on the front of the thigh (Quad) and hold for 5-10 seconds.   °• Straight Leg Raises - With your knee straight (if you were given a brace, keep it on), lift the leg to 60 degrees, hold for 3 seconds, and slowly lower the leg.  Perform this exercise against resistance later as your leg gets stronger.  °• Leg Slides: Lying on your back, slowly slide your foot toward your buttocks, bending your knee up off the floor (only go as far as is comfortable). Then slowly slide your foot back down until your leg is flat on the floor again.  °• Angel Wings: Lying on your back spread your legs to the side as far apart as you can without causing discomfort.  °• Hamstring Strength:  Lying on your back, push your heel against the floor with your leg straight by tightening up the muscles of your buttocks.  Repeat, but this time bend your knee to a comfortable angle, and push your heel against the floor.  You may put a pillow under the heel to make it more comfortable if necessary.  ° °A rehabilitation program following joint replacement surgery can speed recovery and prevent re-injury in the future due to weakened muscles. Contact your doctor or a physical therapist for more information on knee rehabilitation.  ° ° °CONSTIPATION ° °Constipation is defined medically as fewer than three stools per week and severe constipation as less than one stool per week.  Even if you have a regular bowel pattern at home, your normal regimen is likely to be disrupted due to multiple reasons following surgery.  Combination of anesthesia, postoperative narcotics, change in appetite and fluid intake all can affect your bowels.  ° °YOU MUST use at least one of the following options; they are listed in order of increasing strength to get  the job done.  They are all available over the counter, and you may need to use some, POSSIBLY even all of these options:   ° °Drink plenty of fluids (prune juice may be helpful) and high fiber foods °Colace 100 mg by mouth twice a day  °Senokot for constipation as directed and as needed Dulcolax (bisacodyl), take with full glass of water  °Miralax (polyethylene glycol) once or twice a day as needed. ° °If you have tried all these things and are unable to have a bowel movement in the first 3-4 days after surgery call either your surgeon or your primary doctor.   ° °If you experience loose stools or diarrhea, hold the medications until you stool forms back up.  If your symptoms do not get better within 1 week or if they get worse, check with your   doctor.  If you experience "the worst abdominal pain ever" or develop nausea or vomiting, please contact the office immediately for further recommendations for treatment.   ITCHING:  If you experience itching with your medications, try taking only a single pain pill, or even half a pain pill at a time.  You can also use Benadryl over the counter for itching or also to help with sleep.   TED HOSE STOCKINGS:  Use stockings on both legs until for at least 2 weeks or as directed by physician office. They may be removed at night for sleeping.  MEDICATIONS:  See your medication summary on the After Visit Summary that nursing will review with you.  You may have some home medications which will be placed on hold until you complete the course of blood thinner medication.  It is important for you to complete the blood thinner medication as prescribed.  PRECAUTIONS:  If you experience chest pain or shortness of breath - call 911 immediately for transfer to the hospital emergency department.   If you develop a fever greater that 101 F, purulent drainage from wound, increased redness or drainage from wound, foul odor from the wound/dressing, or calf pain - CONTACT YOUR  SURGEON.                                                   FOLLOW-UP APPOINTMENTS:  If you do not already have a post-op appointment, please call the office for an appointment to be seen by your surgeon.  Guidelines for how soon to be seen are listed in your After Visit Summary, but are typically between 1-4 weeks after surgery.  OTHER INSTRUCTIONS:   Knee Replacement:  Do not place pillow under knee, focus on keeping the knee straight while resting. CPM instructions: 0-90 degrees, 2 hours in the morning, 2 hours in the afternoon, and 2 hours in the evening. Place foam block, curve side up under heel at all times except when in CPM or when walking.  DO NOT modify, tear, cut, or change the foam block in any way.  MAKE SURE YOU:   Understand these instructions.   Get help right away if you are not doing well or get worse.    Thank you for letting us be a part of your medical care team.  It is a privilege we respect greatly.  We hope these instructions will help you stay on track for a fast and full recovery!    Information on my medicine - ELIQUIS (apixaban)  Why was Eliquis prescribed for you? Eliquis was prescribed to treat blood clots that may have been found in the veins of your legs (deep vein thrombosis) or in your lungs (pulmonary embolism) and to reduce the risk of them occurring again.  What do You need to know about Eliquis ? The starting dose is 10 mg (two 5 mg tablets) taken TWICE daily for the FIRST SEVEN (7) DAYS, then on 9/4 the dose is reduced to ONE 5 mg tablet taken TWICE daily.  Eliquis may be taken with or without food.   Try to take the dose about the same time in the morning and in the evening. If you have difficulty swallowing the tablet whole please discuss with your pharmacist how to take the medication safely.  Take Eliquis exactly as prescribed and DO NOT  stop taking Eliquis without talking to the doctor who prescribed the medication.  Stopping may  increase your risk of developing a new blood clot.  Refill your prescription before you run out.  After discharge, you should have regular check-up appointments with your healthcare provider that is prescribing your Eliquis.    What do you do if you miss a dose? If a dose of ELIQUIS is not taken at the scheduled time, take it as soon as possible on the same day and twice-daily administration should be resumed. The dose should not be doubled to make up for a missed dose.  Important Safety Information A possible side effect of Eliquis is bleeding. You should call your healthcare provider right away if you experience any of the following: ? Bleeding from an injury or your nose that does not stop. ? Unusual colored urine (red or dark brown) or unusual colored stools (red or black). ? Unusual bruising for unknown reasons. ? A serious fall or if you hit your head (even if there is no bleeding).  Some medicines may interact with Eliquis and might increase your risk of bleeding or clotting while on Eliquis. To help avoid this, consult your healthcare provider or pharmacist prior to using any new prescription or non-prescription medications, including herbals, vitamins, non-steroidal anti-inflammatory drugs (NSAIDs) and supplements.  This website has more information on Eliquis (apixaban): http://www.eliquis.com/eliquis/home

## 2018-07-26 NOTE — Progress Notes (Signed)
Orthopedic Tech Progress Note Patient Details:  Joseph FarberRobert M [name] March 30, 1951 [id number]  CPM Right Knee CPM Right Knee: On Right Knee Flexion (Degrees): 60 Right Knee Extension (Degrees): 0 Additional Comments: trapeze bar patient helper Viewed order from doctor's order list Post Interventions Patient Tolerated: Well Instructions Provided: Care of device  Nikki DomCrawford, [name] 07/26/2018, 11:36 AM

## 2018-07-26 NOTE — Anesthesia Procedure Notes (Signed)
Spinal  Patient location during procedure: OR Start time: 07/26/2018 7:46 AM End time: 07/26/2018 7:50 AM Staffing Anesthesiologist: Val EagleMoser, Yashas Camilli, MD Performed: anesthesiologist  Preanesthetic Checklist Completed: patient identified, surgical consent, pre-op evaluation, timeout performed, IV checked, risks and benefits discussed and monitors and equipment checked Spinal Block Patient position: sitting Prep: Betadine, site prepped and draped and DuraPrep Patient monitoring: heart rate, cardiac monitor, continuous pulse ox and blood pressure Approach: midline Location: L3-4 Injection technique: single-shot Needle Needle type: Pencan  Needle gauge: 24 G Needle length: 10 cm Assessment Sensory level: T4 Additional Notes Functioning IV was confirmed and monitors were applied. Sterile prep and drape, including hand hygiene and sterile gloves were used. The patient was positioned and the spine was prepped. The skin was anesthetized with lidocaine.  Free flow of clear CSF was obtained prior to injecting local anesthetic into the CSF.  The spinal needle aspirated freely following injection.  The needle was carefully withdrawn.  The patient tolerated the procedure well.

## 2018-07-26 NOTE — Progress Notes (Signed)
Patient arrived to room 5N14 at 1150 from pacu.  Orders were released at that time.  Patient has requested pain medication several times but there was nothing in the pyxis under his name.  This RN called pharmacy and was told "the person who verifies the meds hasnt gotten to him yet" but I can go ahead and verify his pain medication.  Waiting for meds to be verified at this time so they can be administered

## 2018-07-26 NOTE — Brief Op Note (Signed)
07/26/2018  10:37 AM  PATIENT:  [name]  67 y.o. male  PRE-OPERATIVE DIAGNOSIS:  right knee arthritis  POST-OPERATIVE DIAGNOSIS:  right knee arthritis  PROCEDURE:  Procedure(s): RIGHT TOTAL KNEE ARTHROPLASTY (Right)  SURGEON:  Surgeon(s) and Role:    * Jessy Oto, MD - Primary  PHYSICIAN ASSISTANT: Esaw Grandchild  ANESTHESIA:   local, regional, spinal, MAC and Dr. Ermalene Postin. Right adductor canal regional, spinal Dr. Ermalene Postin.  EBL:  75 mL   BLOOD ADMINISTERED:none  DRAINS: none   LOCAL MEDICATIONS USED:  MARCAINE 90.5% 1:1 EXPAREL 1.3%Amount: 55m  SPECIMEN:  No Specimen  DISPOSITION OF SPECIMEN:  N/A  COUNTS:  YES  TOURNIQUET:   Total Tourniquet Time Documented: Thigh (Right) - 98 minutes Total: Thigh (Right) - 98 minutes   DICTATION: .DViviann SpareDictation  PLAN OF CARE: Admit to inpatient   PATIENT DISPOSITION:  PACU - hemodynamically stable.   Delay start of Pharmacological VTE agent (>24hrs) due to surgical blood loss or risk of bleeding: no

## 2018-07-26 NOTE — H&P (Signed)
PREOPERATIVE H&P  Chief Complaint: right knee arthritis  HPI: Joseph FarberRobert M [name] is a 67 y.o. male who presents for preoperative history and physical with a diagnosis of right knee arthritis. Symptoms are rated as moderate to severe, and have been worsening.  This is significantly impairing activities of daily living.  He has elected for surgical management.   Past Medical History:  Diagnosis Date  . ALLERGIC RHINITIS 12/31/2007  . COUGH, CHRONIC 07/18/2007  . DEGENERATIVE JOINT DISEASE 07/18/2007  . Extrinsic asthma, unspecified 09/08/2009  . GERD 07/18/2007  . HYPERLIPIDEMIA 08/14/2010  . Hypertension   . Impaired glucose tolerance 09/23/2011  . OBESITY 07/18/2007   Past Surgical History:  Procedure Laterality Date  . BACK SURGERY    . DG FEMUR LEFT  ([facility] HX)     rod placed  . TOOTH EXTRACTION     Social History   Socioeconomic History  . Marital status: Married    Spouse name: Not on file  . Number of children: Not on file  . Years of education: Not on file  . Highest education level: Not on file  Occupational History  . Not on file  Social Needs  . Financial resource strain: Not on file  . Food insecurity:    Worry: Not on file    Inability: Not on file  . Transportation needs:    Medical: Not on file    Non-medical: Not on file  Tobacco Use  . Smoking status: Former Games developermoker  . Smokeless tobacco: Never Used  . Tobacco comment: Marijuana 30 years  Substance and Sexual Activity  . Alcohol use: Yes    Comment: occ  . Drug use: Never  . Sexual activity: Not on file  Lifestyle  . Physical activity:    Days per week: Not on file    Minutes per session: Not on file  . Stress: Not on file  Relationships  . Social connections:    Talks on phone: Not on file    Gets together: Not on file    Attends religious service: Not on file    Active member of club or organization: Not on file    Attends meetings of clubs or organizations: Not on file    Relationship status: Not  on file  Other Topics Concern  . Not on file  Social History Narrative  . Not on file   History reviewed. No pertinent family history. No Known Allergies Prior to Admission medications   Medication Sig Start Date End Date Taking? Authorizing Provider  acetaminophen (TYLENOL) 500 MG tablet Take 500 mg by mouth 2 (two) times daily. For pain    Yes [provider]  albuterol (PROVENTIL HFA;VENTOLIN HFA) 108 (90 Base) MCG/ACT inhaler Inhale 2 puffs into the lungs every 6 (six) hours as needed for wheezing or shortness of breath. 01/07/18  Yes Corwin LevinsJohn, [name], MD  budesonide-formoterol [facility](SYMBICORT) 160-4.5 MCG/ACT inhaler Inhale 2 puffs into the lungs 2 (two) times daily. Patient taking differently: Inhale 1 puff into the lungs every evening.  01/07/18  Yes Corwin LevinsJohn, [name], MD  esomeprazole (NEXIUM) 40 MG capsule Take 1 capsule (40 mg total) by mouth daily before breakfast. Patient taking differently: Take 40 mg by mouth every three (3) days as needed (heartburn).  05/05/18  Yes Corwin LevinsJohn, [name], MD  gabapentin (NEURONTIN) 300 MG capsule Take 1 capsule (300 mg total) by mouth 2 (two) times daily. Patient taking differently: Take 300 mg by mouth at bedtime.  06/24/17  Yes  Kerrin Champagne, MD  HYDROcodone-acetaminophen (NORCO/VICODIN) 5-325 MG tablet Take 1 tablet by mouth at bedtime as needed (for pain.).   Yes [provider]  losartan (COZAAR) 50 MG tablet Take 1 tablet (50 mg total) by mouth daily. Patient taking differently: Take 50 mg by mouth at bedtime.  02/05/18  Yes Corwin Levins, MD  methocarbamol (ROBAXIN) 500 MG tablet Take 1 tablet (500 mg total) by mouth 4 (four) times daily. Patient taking differently: Take 500 mg by mouth every 8 (eight) hours as needed for muscle spasms.  01/30/17  Yes Kerrin Champagne, MD  mupirocin ointment (BACTROBAN) 2 % Place 1 application into the nose 2 (two) times daily. 06/18/18  Yes [provider]  naproxen (NAPROSYN) 500 MG tablet TAKE 1 TABLET  TWICE A DAY WITH FOOD Patient taking differently: Take 1 tablet at bedtime 06/07/18  Yes Kerrin Champagne, MD  HYDROcodone-Acetaminophen 5-300 MG TABS Take 1 tablet by mouth 3 (three) times daily. Patient not taking: Reported on 07/14/2018 03/30/18   Kerrin Champagne, MD  tadalafil (CIALIS) 20 MG tablet Take 1 tablet (20 mg total) by mouth daily as needed for erectile dysfunction. 01/17/15   Corwin Levins, MD     Positive ROS: All other systems have been reviewed and were otherwise negative with the exception of those mentioned in the HPI and as above.  Physical Exam: General: Alert, no acute distress Cardiovascular: No pedal edema Respiratory: No cyanosis, no use of accessory musculature GI: No organomegaly, abdomen is soft and non-tender Skin: No lesions in the area of chief complaint Neurologic: Sensation intact distally Psychiatric: Patient is competent for consent with normal mood and affect Lymphatic: No axillary or cervical lymphadenopathy  MUSCULOSKELETAL: Right knee varus deformity, ROM 10-120, with grating of P-F joint, loose body within the suprapatella pouch.   Assessment: right knee arthritis  Plan: Plan for Procedure(s): RIGHT TOTAL KNEE ARTHROPLASTY  The risks benefits and alternatives were discussed with the patient including but not limited to the risks of nonoperative treatment, versus surgical intervention including infection, bleeding, nerve injury,  blood clots, cardiopulmonary complications, morbidity, mortality, among others, and they were willing to proceed.   Vira Browns, MD Cell [phone] Office [phone] 07/26/2018 7:31 AM

## 2018-07-26 NOTE — Op Note (Signed)
07/26/2018  10:46 AM  PATIENT:  [name]  67 y.o. male  MRN: [id number]  OPERATIVE REPORT   PRE-OPERATIVE DIAGNOSIS:  right knee arthritis  POST-OPERATIVE DIAGNOSIS:  right knee arthritis  PROCEDURE:  Procedure(s): RIGHT TOTAL KNEE ARTHROPLASTY    SURGEON: Basil Dess, MD     ASSISTANT:  Benjiman Core, PA-C  (Present throughout the entire procedure and necessary for completion of procedure in a timely manner)     ANESTHESIA:  Spinal T4 level, with supplemental right adductor block. Supplemented with local marcaine 0.5% 1:1 exparel 1.3% total 20 cc, Dr. Ermalene Postin.     COMPLICATIONS:  None.   EBL:150CC  TOTAL TOURNIQUET TIME:       99 MIN @ 300  mm Hg    COMPONENTS:  DePuy Attune cemented total knee system.  A #8 femoral component.  A #8 tibial tray with a 5 mm polyethylene RP tibial spacer , a 38 mm polyethylene patella.   PROCEDURE:The patient was met in the holding area, and the appropriate right knee identified and marked with "X" and my initials. The patient did not received a preoperative femoral nerve block by anesthesia.  The patient was then transported to OR and was placed on the operative table in a supine position. The patient was then placed under general anesthesia without difficulty. The patient received appropriate preoperative antibiotic prophylaxis. The patien'ts left knee was examined under anesthesia and shown to have -10-120 range of motion,varus deformity, ligamentously stable, and normal patella tracking.Tourniquet was applied to the operative thigh. Leg was then prepped using sterile conditions and draped using sterile technique. Time-out procedure was called and correct right knee identified.  The leg was elevated and Esmarch exsanguinated with a thigh  tourniquet elevated ot 300 mmHg.  Initially, through a 15-cm longitudinal incision based over the patella initial exposure was made. The underlying subcutaneous tissues were incised along with the skin  incision. A median arthrotomy was performed revealing an excessive amount of normal-appearing joint fluid,a large free floating loose body was removed from the medial suprapatella pouch.   The articular surfaces were inspected. The patient had grade 6 changes medially, grade 4 changes laterally, and grade 5 changes in the patellofemoral joint. Osteophytes were removed from the femoral condyles and the tibial plateau. The medial and lateral meniscal remnants were removed as well as the anterior cruciate ligament.   Attention then turned to the femur where the knee was flexed to 90 degrees and an intramedullary guide placed a drill hole placed just anterior 7 mm to the intercondylar notch on the distal femur. The distal cutting jig then attached to the intramedulllay nail 5 degrees of valgus for the right knee. A planned 9 mm to be removed off the most prominent condyle, medial in this case. The jig pinned in place and the distal femoral cut performed.  Anterior bow of the femur measured at 0. Based on this then the amount of proximal tibial cut was determined to be in line with off the depressed medial side of the joint 38m. Using the proximal tibial cutting jig with the leg alignment guide the jig was pinned to the proximal tibia. 3 pins were used 0 degrees of posterior slope.Tibia then subluxed and proximal tibial cut was then performed. Soft tissue tensioning then examined and found to be well balanced in extension. #8 femoral component chosen. The trial placed against the end of the femur and felt to be a good fit. Distal femur cutting jig was then  carefully positioned on the distal femur using the end guide and placing 2 pins in the anterior end of the cut distal femur.  2 threaded pins used to pin the jig in place   Rotation guide was then corrected the alignment 2 pins placed over the distal cut surface of the femur for placement of the jig for the chamfer cuts and coronal cuts. This is a carefully placed  and held in place with threaded pins. Protecting soft tissues then the anterior cut was made after first verifying with the wing depth of the cut. Then the posterior coronal cuts protect soft tissue structures posteriorly. Posterior chamfer cut and anterior chamfer cut. These were done without difficulty. Cutting guide removed and box cutting jig was then applied to the distal femur carefully aligned and pinned into place. Box cut was then performed from the distal femur intercondylar box. Proximal tibia was then subluxed the posterior cruciate resected. A #8 plate and attached to the transverse cut surface of the proximal tibia using 2 pins. The upright for the reamer was then applied and reaming carried out for the keel for the tibial component. Using the osteotome was then inserted and impacted into place the temporary fixation pins removed from the proximal tibial plate. Femoral component was inserted using a trial component. Then a 5 mm insert placed and the knee brought into full extension full extension to 0 and 1.5 hyperextension was possible full flexion to 135 without difficulty or lift off. The components were accepted and the permanent #8 femoral and tibial components chosen and brought onto the field. Patella was observed to be a width anterior to posterior 23 mm and the residual 15.5 mm was allowed for the patella component. The cutting jig was then adjusted to allow for 15.5 mm remaining width. Applied and then the coronal cut the posterior aspect of the patella was performed. Lateral facet of patella showed very little resection. The trial 38 mm patella was chosen based on the size of the cut surface of patella. The 38 mm drill plate applied and drill holes placed through the posterior aspect of the patella. Trial reduction with the 38 mm trial and the leg placed through range of motion showed excellent motion full extension and flexion 135 patella remained located and glided normally with  flexion and extension. No shuck noted then the knee was stable to varus and valgus at 0 30 and 60. Expose were then removed and the knee was then irrigated with copious amounts of. Solution. A 38 mm permanent patella prosthesis also brought to the field as well as the #8 femoral and tibial components. Cement was mixed the knee was subluxed and carefully soft tissues protected note that the lug holes in the distal femur where drill using the trial prosthesis and with the drill guide provided. With irrigation completed and permanent tibial components were then inserted into place using cement and a semi-putty state over the proximal aspect of the tibia cement was also applied to the deep surface of the tibial component as well as over the posterior runners of the femur and the deep surface of the patella component. These were then carefully place excess cement removed about the circumference of the tibial tray the femur was then placed applying cement to the cut it distal aspect of the femur and posterior chamfer area anterior chamfer and anterior coronal cut surface small amount within the box. Femoral component was then impacted into place excess cement removed amounts of circumference including  the posterior chamfer area and the posterior femoral condyle area. The trial tibial peg then placed in the tibial component and a 5 mm rotating platform trial was inserted knee brought into full extension observed on the appeared to be in 0 of valgus with full extension 1 of hyperextension. Cement was then applied to the posterior cut surface of the patella within each of the patella peg opening was then pin holes were placed for the cementing along the lateral aspect of the patella. Patella component was then carefully aligned and inserted over the posterior aspect of the patella and clamped in place excess cement was then resected circumferentially using scalpel in order for her to preserve cement the lateral facet  area. Cement was allowed to fully harden tourniquet was released while cement was nearly completely hardened. Following this then irrigation was carried down careful inspection demonstrated some small bleeders present over the lateral posterior aspect of the knee joint cauterized using electrocautery off to the medial aspect and the areas of geniculate arteries. There is no active bleeding present then at that time. Trial 5 mm tibial insert demonstrated stability of the anterior drawer and negative and the knee stable to varus valgus stress and 30 and 60 flexion and full extension. A 5 mm rotating platform insert was then chosen this was applied to the tibia using the permanent implant removing the trial examining tibia and determined there was no evident residual cement remaining. Also examined posterior runners I posterior aspect of the femoral condyles for any residual cement none was present. The tibial insert in place the reduced placed through range of motion and full extension flexion and 30 knee stable to varus stress at 0 30 and 60 anterior drawer negative. This component was accepted.Tourniquet released at 99 minutes.  A drain was not necessary the synovium reapproximated over the medial aspect of the knee with 0 Vicryl sutures.  The retinaculum of the knee and the quadriceps tendon were then reapproximated with interrupted #1 Vicryl sutures. Peritenon of the reapproximated with interrupted 0 Vicryl sutures deep subcutaneous layers approximated with interrupted 0 and 2-0 Vicryl sutures and the skin closed with stainless steel staples. Adaptic 4 x 4's ABDs pads fixed to the skin with sterile labrum an Ace wrap applied from the foot to the right upper thigh and knee immobilizer. All instrument and sponge counts were correct. Then reactivated extubated and returned to recovery room in satisfactory condition.  Benjiman Core, PA-C perform the duties of assistant surgeon she performed careful retraction of  soft tissues assisted in addition the patient had removal on the OR table is present beginning the case the case and performed closure of the incision from the peritenon to the skin and application of dressing.  Basil Dess 07/26/2018, 10:46 AM

## 2018-07-27 ENCOUNTER — Telehealth (INDEPENDENT_AMBULATORY_CARE_PROVIDER_SITE_OTHER): Payer: Self-pay

## 2018-07-27 ENCOUNTER — Encounter (HOSPITAL_COMMUNITY): Payer: Self-pay | Admitting: Specialist

## 2018-07-27 LAB — BASIC METABOLIC PANEL
Anion gap: 6 (ref 5–15)
BUN: 17 mg/dL (ref 8–23)
CHLORIDE: 107 mmol/L (ref 98–111)
CO2: 24 mmol/L (ref 22–32)
Calcium: 8.8 mg/dL — ABNORMAL LOW (ref 8.9–10.3)
Creatinine, Ser: 1.24 mg/dL (ref 0.61–1.24)
GFR calc Af Amer: >60 mL/min (ref >60)
GFR calc non Af Amer: 59 mL/min — ABNORMAL LOW (ref >60)
GLUCOSE: 142 mg/dL — AB (ref 70–99)
Potassium: 4.3 mmol/L (ref 3.5–5.1)
Sodium: 137 mmol/L (ref 135–145)

## 2018-07-27 LAB — CBC
HEMATOCRIT: 35.3 % — AB (ref 39.0–52.0)
HEMOGLOBIN: 11.3 g/dL — AB (ref 13.0–17.0)
MCH: 30.4 pg (ref 26.0–34.0)
MCHC: 32 g/dL (ref 30.0–36.0)
MCV: 94.9 fL (ref 78.0–100.0)
Platelets: 180 10*3/uL (ref 150–400)
RBC: 3.72 MIL/uL — ABNORMAL LOW (ref 4.22–5.81)
RDW: 12.8 % (ref 11.5–15.5)
WBC: 10.3 10*3/uL (ref 4.0–10.5)

## 2018-07-27 MED ORDER — PNEUMOCOCCAL VAC POLYVALENT 25 MCG/0.5ML IJ INJ
0.5000 mL | INJECTION | INTRAMUSCULAR | Status: AC
  Administered 2018-07-28: 0.5 mL via INTRAMUSCULAR
  Filled 2018-07-27: qty 0.5

## 2018-07-27 NOTE — Evaluation (Signed)
Occupational Therapy Evaluation Patient Details Name: [name] MRN: [id number] DOB: 12/05/1950 Today's Date: 07/27/2018    History of Present Illness Pt is a 67 y/o male s/p elective R TKA. PMH includes HTN.    Clinical Impression   Pt admitted with the above diagnoses and presents with below problem list. Pt will benefit from continued acute OT to address the below listed deficits and maximize independence with basic ADLs prior to d/c home. PTA pt was independent with ADLs, works as a Naval architect. Pt is currently mostly min guard with occasional min A with LB ADLs, toilet and shower transfers, and functional mobility.       Follow Up Recommendations  No OT follow up;Supervision - Intermittent    Equipment Recommendations  None recommended by OT    Recommendations for Other Services       Precautions / Restrictions Precautions Precautions: Knee Precaution Booklet Issued: Yes (comment) Precaution Comments: Reviewed knee precautions  Restrictions Weight Bearing Restrictions: Yes RLE Weight Bearing: Weight bearing as tolerated      Mobility Bed Mobility Overal bed mobility: Modified Independent Bed Mobility: Supine to Sit;Sit to Supine     Supine to sit: Supervision Sit to supine: Modified independent (Device/Increase time)   General bed mobility comments: pt able to bring bilat LE back into bed without assist  Transfers Overall transfer level: Needs assistance Equipment used: Rolling walker (2 wheeled) Transfers: Sit to/from Stand Sit to Stand: Min assist;Min guard         General transfer comment: minA for initial power up and steadiness during transition of hands from chair to walker    Balance Overall balance assessment: Needs assistance Sitting-balance support: Feet supported;No upper extremity supported Sitting balance-Leahy Scale: Good     Standing balance support: Bilateral upper extremity supported;During functional activity Standing  balance-Leahy Scale: Poor Standing balance comment: Reliant on BUE support. able to stand to brush teeth while leaning into sink                           ADL either performed or assessed with clinical judgement   ADL Overall ADL's : Needs assistance/impaired Eating/Feeding: Set up;Sitting   Grooming: Min guard;Oral care;Standing   Upper Body Bathing: Set up;Sitting   Lower Body Bathing: Min guard;Sit to/from stand   Upper Body Dressing : Set up;Sitting   Lower Body Dressing: Min guard;Sit to/from stand   Toilet Transfer: Min guard;Ambulation;RW(3n1 over toilet)   Toileting- Clothing Manipulation and Hygiene: Min guard;Sit to/from stand   Tub/ Shower Transfer: Tub transfer;Min guard;Ambulation;3 in 1;Rolling walker Tub/Shower Transfer Details (indicate cue type and reason): Pt reports he practiced prior to surgery. Also provided handout on how to use 3n1 as tub shower seat. Functional mobility during ADLs: Min guard;Rolling walker General ADL Comments: Pt completed bed mobility, ambulated to/from bathroom, toilet transfer, and grooming task as detailed above.     Vision         Perception     Praxis      Pertinent Vitals/Pain Pain Assessment: Faces Pain Score: 4  Faces Pain Scale: Hurts little more Pain Location: R knee Pain Descriptors / Indicators: Aching;Operative site guarding Pain Intervention(s): Limited activity within patient's tolerance;Monitored during session;Premedicated before session;Repositioned     Hand Dominance     Extremity/Trunk Assessment Upper Extremity Assessment Upper Extremity Assessment: Overall WFL for tasks assessed   Lower Extremity Assessment Lower Extremity Assessment: Defer to PT evaluation   Cervical / Trunk  Assessment Cervical / Trunk Assessment: Normal   Communication Communication Communication: No difficulties   Cognition Arousal/Alertness: Awake/alert Behavior During Therapy: WFL for tasks  assessed/performed Overall Cognitive Status: Within Functional Limits for tasks assessed                                     General Comments          Shoulder Instructions      Home Living Family/patient expects to be discharged to:: Private residence Living Arrangements: Spouse/significant other Available Help at Discharge: Family;Available 24 hours/day Type of Home: House Home Access: Stairs to enter Entergy CorporationEntrance Stairs-Number of Steps: 2 Entrance Stairs-Rails: None Home Layout: One level     Bathroom Shower/Tub: Chief Strategy OfficerTub/shower unit   Bathroom Toilet: Standard     Home Equipment: Environmental consultantWalker - 2 wheels;Bedside commode          Prior Functioning/Environment Level of Independence: Independent        Comments: Was working as a Health and safety inspectortruck driver         OT Problem List: Impaired balance (sitting and/or standing);Decreased knowledge of use of DME or AE;Decreased knowledge of precautions;Pain      OT Treatment/Interventions: Self-care/ADL training;DME and/or AE instruction;Therapeutic activities;Patient/family education;Balance training    OT Goals(Current goals can be found in the care plan section) Acute Rehab OT Goals Patient Stated Goal: "I want to do what you want me to do" OT Goal Formulation: With patient Time For Goal Achievement: 08/03/18 Potential to Achieve Goals: Good ADL Goals Pt Will Perform Lower Body Bathing: with modified independence;sit to/from stand Pt Will Perform Lower Body Dressing: with modified independence;sit to/from stand Pt Will Transfer to Toilet: with modified independence;ambulating Pt Will Perform Toileting - Clothing Manipulation and hygiene: with modified independence;sit to/from stand Pt Will Perform Tub/Shower Transfer: Tub transfer;with supervision;ambulating;3 in 1;rolling walker  OT Frequency: Min 2X/week   Barriers to D/C:            Co-evaluation              AM-PAC PT "6 Clicks" Daily Activity     Outcome  Measure Help from another person eating meals?: None Help from another person taking care of personal grooming?: None Help from another person toileting, which includes using toliet, bedpan, or urinal?: A Little Help from another person bathing (including washing, rinsing, drying)?: A Little Help from another person to put on and taking off regular upper body clothing?: None Help from another person to put on and taking off regular lower body clothing?: A Little 6 Click Score: 21   End of Session Equipment Utilized During Treatment: Rolling walker CPM Right Knee CPM Right Knee: Off  Activity Tolerance: Patient tolerated treatment well Patient left: in bed;with call bell/phone within reach  OT Visit Diagnosis: Unsteadiness on feet (R26.81);Pain Pain - Right/Left: Right Pain - part of body: Knee                Time: [id number] OT Time Calculation (min): 25 min Charges:  OT General Charges $OT Visit: 1 Visit OT Evaluation $OT Eval Low Complexity: 1 Low OT Treatments $Self Care/Home Management : 8-22 mins    Pilar GrammesMathews, Mackinzie Vuncannon H 07/27/2018, 1:59 PM

## 2018-07-27 NOTE — Plan of Care (Signed)
?  Problem: Education: ?Goal: Knowledge of General Education information will improve ?Description: Including pain rating scale, medication(s)/side effects and non-pharmacologic comfort measures ?Outcome: Progressing ?  ?Problem: Clinical Measurements: ?Goal: Ability to maintain clinical measurements within normal limits will improve ?Outcome: Progressing ?  ?Problem: Activity: ?Goal: Risk for activity intolerance will decrease ?Outcome: Progressing ?  ?Problem: Elimination: ?Goal: Will not experience complications related to bowel motility ?Outcome: Progressing ?  ?Problem: Pain Managment: ?Goal: General experience of comfort will improve ?Outcome: Progressing ?  ?

## 2018-07-27 NOTE — Progress Notes (Signed)
     Subjective: 1 Day Post-Op Procedure(s) (LRB): RIGHT TOTAL KNEE ARTHROPLASTY (Right) Awake, alert and oriented x 4. Right leg N-V normal, had DF weakness yesterday but this is resolved. Dressing with saturation with blood, changed to mepilex dressing tonight. New ACE wrap applied. Moderated pain, regional and local probable wearing off. Ecchymosis right lateral and posterior knee.  Discomfort right posterior knee.   Patient reports pain as marked.    Objective:   VITALS:  Temp:  [97.3 F (36.3 C)-97.6 F (36.4 C)] 97.6 F (36.4 C) (08/27 1349) Pulse Rate:  [66-84] 84 (08/27 1349) Resp:  [16] 16 (08/27 1349) BP: (121-137)/(75-87) 131/75 (08/27 1349) SpO2:  [97 %-99 %] 98 % (08/27 1349)  Neurologically intact ABD soft Neurovascular intact Sensation intact distally Intact pulses distally Dorsiflexion/Plantar flexion intact Incision: moderate drainage and dressing changed.  Ace wrap applied to knee.  No cellulitis present Compartment soft   LABS Recent Labs    07/27/18 0603  HGB 11.3*  WBC 10.3  PLT 180   Recent Labs    07/27/18 0603  NA 137  K 4.3  CL 107  CO2 24  BUN 17  CREATININE 1.24  GLUCOSE 142*   No results for input(s): LABPT, INR in the last 72 hours.   Assessment/Plan: 1 Day Post-Op Procedure(s) (LRB): RIGHT TOTAL KNEE ARTHROPLASTY (Right)  Advance diet Up with therapy D/C IV fluids Plan for discharge tomorrow Discharge home with home health  Vira BrownsJames Nitka 07/27/2018, 6:25 PMPatient ID: Eino Farberobert M Shutter, male   DOB: [birth year], 67 y.o.   MRN: [id number]

## 2018-07-27 NOTE — Progress Notes (Signed)
OT Cancellation Note  Patient Details Name: Joseph FarberRobert M [name] MRN: [id number] DOB: 1951/02/12   Cancelled Treatment:    Reason Eval/Treat Not Completed: Pain limiting ability to participate;Other (comment). Pt recently finished PT session and reporting 5/10 pain (FACES 8/10). Plan to reattempt.   Pilar GrammesMathews, Kaushik Maul H 07/27/2018, 12:23 PM

## 2018-07-27 NOTE — Telephone Encounter (Signed)
Called to confirm that pt did have surgery yesterday and cpt code billed so they can pay him his benefits

## 2018-07-27 NOTE — Progress Notes (Signed)
Physical Therapy Treatment Patient Details Name: Joseph FarberRobert M [name] MRN: [id number] DOB: Jan 22, 1951 Today's Date: 07/27/2018    History of Present Illness Pt is a 67 y/o male s/p elective R TKA. PMH includes HTN.     PT Comments    Pt with improved ROM of R knee and ambulation tolerance this date. Pt remains to have expected R quadricep weakness, limiting ability to perform strong quad set and limiting ambulation tolerance. Pt motivate and anticipate with progress well once he can complete active R quad set consistently. Pt able to achive 70 deg of active R Knee flexion sitting EOB. Acute PT to cont to follow.   Follow Up Recommendations  Follow surgeon's recommendation for DC plan and follow-up therapies;Supervision for mobility/OOB     Equipment Recommendations  None recommended by PT    Recommendations for Other Services OT consult     Precautions / Restrictions Precautions Precautions: Knee Precaution Booklet Issued: Yes (comment) Precaution Comments: Reviewed knee precautions and supine HEP with pt.  Restrictions Weight Bearing Restrictions: Yes RLE Weight Bearing: Weight bearing as tolerated    Mobility  Bed Mobility Overal bed mobility: Needs Assistance Bed Mobility: Supine to Sit     Supine to sit: Supervision     General bed mobility comments: increased time, able to manage R LE  Transfers Overall transfer level: Needs assistance Equipment used: Rolling walker (2 wheeled) Transfers: Sit to/from Stand Sit to Stand: Min assist         General transfer comment: minA for initial power up, increased time, dependent on bilat UEs, minimal R LE WBing  Ambulation/Gait Ambulation/Gait assistance: Min assist Gait Distance (Feet): 50 Feet Assistive device: Rolling walker (2 wheeled) Gait Pattern/deviations: Step-to pattern;Decreased step length - right;Decreased step length - left;Decreased weight shift to left;Antalgic Gait velocity: dec Gait velocity  interpretation: <1.8 ft/sec, indicate of risk for recurrent falls General Gait Details: max directional verbal cues for sequencing and ot complete quad set with R knee when in stance phase of gait. Pt with onset of bilat UE fatigue due to excessive bilat UE support, encouraged pt to increased WBing on LEs and decrease WBing oN UEs   Stairs             Wheelchair Mobility    Modified Rankin (Stroke Patients Only)       Balance Overall balance assessment: Needs assistance Sitting-balance support: Feet supported;No upper extremity supported Sitting balance-Leahy Scale: Good     Standing balance support: Bilateral upper extremity supported;During functional activity Standing balance-Leahy Scale: Poor Standing balance comment: Reliant on BUE support                             Cognition Arousal/Alertness: Awake/alert Behavior During Therapy: WFL for tasks assessed/performed Overall Cognitive Status: Within Functional Limits for tasks assessed                                        Exercises Total Joint Exercises Ankle Circles/Pumps: AROM;Both;20 reps;Supine Quad Sets: AROM;Right;10 reps;Supine Heel Slides: AROM;Right;10 reps;Supine(also completed in sitting with washcloth under foot)    General Comments        Pertinent Vitals/Pain Pain Assessment: 0-10 Pain Score: 2 (2 at rest, 6 with WBing) Pain Location: R knee Pain Descriptors / Indicators: Aching;Operative site guarding Pain Intervention(s): Limited activity within patient's tolerance    Home Living  Prior Function            PT Goals (current goals can now be found in the care plan section) Acute Rehab PT Goals Patient Stated Goal: walk Progress towards PT goals: Progressing toward goals    Frequency    7X/week      PT Plan Current plan remains appropriate    Co-evaluation              AM-PAC PT "6 Clicks" Daily Activity   Outcome Measure  Difficulty turning over in bed (including adjusting bedclothes, sheets and blankets)?: None Difficulty moving from lying on back to sitting on the side of the bed? : None Difficulty sitting down on and standing up from a chair with arms (e.g., wheelchair, bedside commode, etc,.)?: Unable Help needed moving to and from a bed to chair (including a wheelchair)?: A Little Help needed walking in hospital room?: A Little Help needed climbing 3-5 steps with a railing? : A Lot 6 Click Score: 17    End of Session Equipment Utilized During Treatment: Gait belt Activity Tolerance: Patient tolerated treatment well Patient left: in chair;with call bell/phone within reach Nurse Communication: Mobility status PT Visit Diagnosis: Unsteadiness on feet (R26.81);Muscle weakness (generalized) (M62.81);Pain Pain - Right/Left: Right Pain - part of body: Knee     Time: 0730-0803 PT Time Calculation (min) (ACUTE ONLY): 33 min  Charges:  $Gait Training: 8-22 mins $Therapeutic Exercise: 8-22 mins                     [name], PT, DPT Pager #: [phone] Office #: [phone]    [name] M [name] 07/27/2018, 10:02 AM

## 2018-07-27 NOTE — Progress Notes (Signed)
Physical Therapy Treatment Patient Details Name: [name] MRN: [id number] DOB: 1951/04/13 Today's Date: 07/27/2018    History of Present Illness Pt is a 67 y/o male s/p elective R TKA. PMH includes HTN.     PT Comments    Pt con't to have difficulty with ambulation due to increased bilat UE fatigue due to increased bilat UE WBing. Pt very motivated and dedicated to his HEP. Acute PT to cont to follow.    Follow Up Recommendations  Follow surgeon's recommendation for DC plan and follow-up therapies;Supervision for mobility/OOB     Equipment Recommendations  None recommended by PT    Recommendations for Other Services OT consult     Precautions / Restrictions Precautions Precautions: Knee Precaution Booklet Issued: Yes (comment) Precaution Comments: Reviewed knee precautions and supine HEP with pt.  Restrictions Weight Bearing Restrictions: Yes RLE Weight Bearing: Weight bearing as tolerated    Mobility  Bed Mobility Overal bed mobility: Modified Independent Bed Mobility: Sit to Supine     Supine to sit: Supervision Sit to supine: Modified independent (Device/Increase time)   General bed mobility comments: pt able to bring bilat LE back into bed without assist  Transfers Overall transfer level: Needs assistance Equipment used: Rolling walker (2 wheeled) Transfers: Sit to/from Stand Sit to Stand: Min assist         General transfer comment: minA for initial power up and steadiness during transition of hands from chair to walker  Ambulation/Gait Ambulation/Gait assistance: Min guard Gait Distance (Feet): 100 Feet Assistive device: Rolling walker (2 wheeled) Gait Pattern/deviations: Step-to pattern;Antalgic Gait velocity: dec Gait velocity interpretation: <1.8 ft/sec, indicate of risk for recurrent falls General Gait Details: pt cont to have significant weight-bearing on bilat UEs, pt focusing on achieving terminal knee extension in stance phase on the R   LE however encouraged pt to activate the R quad not push it to full extension   Stairs             Wheelchair Mobility    Modified Rankin (Stroke Patients Only)       Balance Overall balance assessment: Needs assistance Sitting-balance support: Feet supported;No upper extremity supported Sitting balance-Leahy Scale: Good     Standing balance support: Bilateral upper extremity supported;During functional activity Standing balance-Leahy Scale: Poor Standing balance comment: Reliant on BUE support                             Cognition Arousal/Alertness: Awake/alert Behavior During Therapy: WFL for tasks assessed/performed Overall Cognitive Status: Within Functional Limits for tasks assessed                                        Exercises Total Joint Exercises Ankle Circles/Pumps: AROM;Both;20 reps;Supine Quad Sets: AROM;Right;10 reps;Supine Heel Slides: AROM;Right;10 reps;Supine    General Comments        Pertinent Vitals/Pain Pain Assessment: 0-10 Pain Score: 4  Pain Location: R knee Pain Descriptors / Indicators: Aching;Operative site guarding Pain Intervention(s): Monitored during session    Home Living                      Prior Function            PT Goals (current goals can now be found in the care plan section) Acute Rehab PT Goals Patient Stated Goal: "I want  to do waht you want me to do" Progress towards PT goals: Progressing toward goals    Frequency    7X/week      PT Plan Current plan remains appropriate    Co-evaluation              AM-PAC PT "6 Clicks" Daily Activity  Outcome Measure  Difficulty turning over in bed (including adjusting bedclothes, sheets and blankets)?: None Difficulty moving from lying on back to sitting on the side of the bed? : None Difficulty sitting down on and standing up from a chair with arms (e.g., wheelchair, bedside commode, etc,.)?: A Little Help needed  moving to and from a bed to chair (including a wheelchair)?: A Little Help needed walking in hospital room?: A Little Help needed climbing 3-5 steps with a railing? : A Lot 6 Click Score: 19    End of Session Equipment Utilized During Treatment: Gait belt Activity Tolerance: Patient tolerated treatment well Patient left: in chair;with call bell/phone within reach Nurse Communication: Mobility status PT Visit Diagnosis: Unsteadiness on feet (R26.81);Muscle weakness (generalized) (M62.81);Pain Pain - Right/Left: Right Pain - part of body: Knee     Time: [id number] PT Time Calculation (min) (ACUTE ONLY): 30 min  Charges:  $Gait Training: 8-22 mins $Therapeutic Exercise: 8-22 mins                     Lewis ShockAshly Zanayah Shadowens, PT, DPT Pager #: [phone] Office #: [phone]    Janaysia Mcleroy M Najwa Spillane 07/27/2018, 12:49 PM

## 2018-07-28 ENCOUNTER — Encounter (HOSPITAL_COMMUNITY): Payer: Self-pay | Admitting: General Practice

## 2018-07-28 ENCOUNTER — Inpatient Hospital Stay (HOSPITAL_COMMUNITY): Payer: Managed Care, Other (non HMO)

## 2018-07-28 ENCOUNTER — Other Ambulatory Visit: Payer: Self-pay

## 2018-07-28 DIAGNOSIS — I82409 Acute embolism and thrombosis of unspecified deep veins of unspecified lower extremity: Secondary | ICD-10-CM

## 2018-07-28 DIAGNOSIS — Z96651 Presence of right artificial knee joint: Secondary | ICD-10-CM

## 2018-07-28 DIAGNOSIS — M7989 Other specified soft tissue disorders: Secondary | ICD-10-CM

## 2018-07-28 DIAGNOSIS — M79609 Pain in unspecified limb: Secondary | ICD-10-CM

## 2018-07-28 LAB — CBC
HEMATOCRIT: 34.6 % — AB (ref 39.0–52.0)
Hemoglobin: 10.9 g/dL — ABNORMAL LOW (ref 13.0–17.0)
MCH: 30.5 pg (ref 26.0–34.0)
MCHC: 31.5 g/dL (ref 30.0–36.0)
MCV: 96.9 fL (ref 78.0–100.0)
Platelets: 165 10*3/uL (ref 150–400)
RBC: 3.57 MIL/uL — AB (ref 4.22–5.81)
RDW: 13.1 % (ref 11.5–15.5)
WBC: 9.8 10*3/uL (ref 4.0–10.5)

## 2018-07-28 MED ORDER — OXYCODONE HCL ER 10 MG PO T12A
20.0000 mg | EXTENDED_RELEASE_TABLET | Freq: Two times a day (BID) | ORAL | Status: DC
  Administered 2018-07-28 (×2): 20 mg via ORAL
  Administered 2018-07-29: 10 mg via ORAL
  Filled 2018-07-28 (×3): qty 2

## 2018-07-28 MED ORDER — APIXABAN 5 MG PO TABS: 10.0000 mg | ORAL_TABLET | Freq: Two times a day (BID) | ORAL | Status: DC

## 2018-07-28 MED ORDER — APIXABAN 5 MG PO TABS
10.0000 mg | ORAL_TABLET | Freq: Two times a day (BID) | ORAL | Status: DC
  Administered 2018-07-28 – 2018-07-29 (×2): 10 mg via ORAL
  Filled 2018-07-28 (×2): qty 2

## 2018-07-28 NOTE — Progress Notes (Signed)
Preliminary notes--Right lower extremity venous duplex exam completed. Positive acute deep vein thrombosis noticed at Soleal vein.  Hongying Richardson DoppCole (RDMS RVT) 07/28/18 12:41 PM

## 2018-07-28 NOTE — Progress Notes (Signed)
     Subjective: 2 Days Post-Op Procedure(s) (LRB): RIGHT TOTAL KNEE ARTHROPLASTY (Right) Awake, alert, increased pain today, pain in the right posterior knee, marked, decreased ROM.  Patient reports pain as marked.    Objective:   VITALS:  Temp:  [97.6 F (36.4 C)-99 F (37.2 C)] 98.8 F (37.1 C) (08/28 0428) Pulse Rate:  [84-92] 92 (08/28 0428) Resp:  [16] 16 (08/28 0428) BP: (107-131)/(62-75) 107/62 (08/28 0428) SpO2:  [91 %-98 %] 94 % (08/28 0837)  Neurologically intact ABD soft Neurovascular intact Sensation intact distally Intact pulses distally Dorsiflexion/Plantar flexion intact Incision: scant drainage   LABS Recent Labs    07/27/18 0603 07/28/18 0405  HGB 11.3* 10.9*  WBC 10.3 9.8  PLT 180 165   Recent Labs    07/27/18 0603  NA 137  K 4.3  CL 107  CO2 24  BUN 17  CREATININE 1.24  GLUCOSE 142*   No results for input(s): LABPT, INR in the last 72 hours.   Assessment/Plan: 2 Days Post-Op Procedure(s) (LRB): RIGHT TOTAL KNEE ARTHROPLASTY (Right)  Advance diet Up with therapy D/C IV fluids  Will increase baseline oxycontin, recheck in the afternoon to be sure he has better pain control before Considering discharge. Check doppler to rule out DVT due to increased knee pain and swelling.  Joseph BrownsJames Tawan [name] 07/28/2018, 8:38 AMPatient ID: [name], male   DOB: [birth year], 67 y.o.   MRN: [id number]

## 2018-07-28 NOTE — Progress Notes (Signed)
PT Cancellation Note  Patient Details Name: Joseph FarberRobert M [name] MRN: [id number] DOB: [birth year]   Cancelled Treatment:    Reason Eval/Treat Not Completed: Other (comment)   Attempted PT session earlier this am, but held due to pain;   Returned at 11:30; Spoke with RN, who informed me Ultrasound called and will be taking him for a test momentarily;   Will return for PM session;   Van ClinesHolly Noha Karasik, PT  Acute Rehabilitation Services Pager [phone] Office [phone]    Joseph AlandHolly H Mio [name] 07/28/2018, 11:33 AM

## 2018-07-28 NOTE — Care Management (Signed)
07-29-18  BENEFITS CHECK :  # 4.   [facility]  [employer] [facility] # [phone]  1. ELIQUIS COVER- YES PRIOR APPROVAL- YES  # [phone] FOR EXCEPTION  2. ELIQUIS  COVER- YES PRIOR APPROVAL- YES # [phone] FOR EXCEPTION  3. XARELTO   COVER- YES PRIOR APPROVAL- YES # [phone] FOE EXCEPTION  4. XARELTO   COVER- YES PRIOR APPROVAL- YES # [phone] FOR EXCEPTION  5. PRADAXA   COVER- NOT COVER AT ALL PRIOR APPROVAL- YES # [phone]  PREFERRED PHARMACY : YES [facility]

## 2018-07-28 NOTE — Progress Notes (Signed)
Patient ID: Joseph FarberRobert M [name], male   DOB: 1950-12-06, 67 y.o.   MRN: [id number] Results of doppler venous study left leg show a thrombus right soleal vein. I have held CPM and asked for a [facility] hospitalist consult to weigh in on Whether to anticoagulate for this finding. He is on  Aspirin currently. Hold discharge.

## 2018-07-28 NOTE — Consult Note (Signed)
Medical Consultation   [name]  WUJ:[id number]  DOB: Jul 29, 1951  DOA: 07/26/2018  PCP: Corwin Levins, MD   Outpatient Specialists: Otelia Sergeant - orthopedics   Requesting physician: Otelia Sergeant, orthopedics  Reason for consultation: TKR 2 days ago.  He has complained over knee pain x 1-2 days.  DVT shows a thrombus.  Question of whether or not to anticoagulate.  He has been given ASA but otherwise has no contraindication to [facility].  Creatinine is 1.24, mild impaired renal function.   Hgb 11.3 -> 10.9.  History of Present Illness: [name] is an 67 y.o. male with h/o obesity; HTN; HLD; and impaired glucose tolerance who was admitted on 8/26 for right total knee arthroplasty.  The surgery went well.  He was moving it great the day a=fter surgry and yesterday AM but yesterday evening he started having more trouble moving it.  He thought it was because the pain shot had worn off.  Otherwise, he is doing fine.   Review of Systems:  ROS As per HPI otherwise 10 point review of systems negative.    Past Medical History: Past Medical History:  Diagnosis Date  . ALLERGIC RHINITIS 12/31/2007  . COUGH, CHRONIC 07/18/2007  . DEGENERATIVE JOINT DISEASE 07/18/2007  . Extrinsic asthma, unspecified 09/08/2009  . GERD 07/18/2007  . HYPERLIPIDEMIA 08/14/2010  . Hypertension   . Impaired glucose tolerance 09/23/2011  . OBESITY 07/18/2007    Past Surgical History: Past Surgical History:  Procedure Laterality Date  . BACK SURGERY    . DG FEMUR LEFT  ([facility] HX)     rod placed  . TOOTH EXTRACTION    . TOTAL KNEE ARTHROPLASTY Right 07/26/2018   Procedure: RIGHT TOTAL KNEE ARTHROPLASTY;  Surgeon: Kerrin Champagne, MD;  Location: [facility] OR;  Service: Orthopedics;  Laterality: Right;     Allergies:  No Known Allergies   Social History:  reports that he has quit smoking. He has never used smokeless tobacco. He reports that he drinks alcohol. He reports that he does not use  drugs.   Family History: History reviewed. No pertinent family history.    Physical Exam: Vitals:   07/27/18 2056 07/28/18 0428 07/28/18 0837 07/28/18 1456  BP: 117/75 107/62  116/64  Pulse: 87 92  90  Resp: 16 16  18   Temp: 99 F (37.2 C) 98.8 F (37.1 C)  98.8 F (37.1 C)  TempSrc: Oral Oral  Oral  SpO2: 98% 91% 94% 99%    Constitutional: Alert and awake, oriented x3, not in any acute distress. Eyes: PERLA, EOMI, irises appear normal, anicteric sclera,  ENMT: external ears and nose appear normal, normal hearing, Lips appear normal Neck: neck appears normal, no masses, normal ROM, no thyromegaly, no JVD  CVS: S1-S2 clear, no murmur rubs or gallops, no LE edema, normal pedal pulses  Respiratory:  clear to auscultation bilaterally, no wheezing, rales or rhonchi. Respiratory effort normal. No accessory muscle use.  Abdomen: soft nontender, nondistended, normal bowel sounds, no hepatosplenomegaly, no hernias  Musculoskeletal: :s/p R TKR with diffuse edema of thigh and lower leg (not covered by bandage), good distal pulses Neuro: Cranial nerves II-XII intact, strength, sensation, reflexes Psych: judgement and insight appear normal, stable mood and affect, mental status Skin: no rashes or lesions or ulcers, no induration or nodules    Data reviewed:  I have personally reviewed the recent labs and imaging studies  Pertinent  Labs:   Glucose 142 BUN 17/Creatinine 1.24/GFR 59 - stable Hgb 10.9, 11.3 yesterday; 14.2 on 8/16   Inpatient Medications:   Scheduled Meds: . aspirin EC  325 mg Oral Q breakfast  . docusate sodium  100 mg Oral BID  . gabapentin  300 mg Oral TID  . losartan  50 mg Oral Daily  . mometasone-formoterol  2 puff Inhalation BID  . oxyCODONE  20 mg Oral Q12H  . pantoprazole  80 mg Oral Q1200    Radiological Exams on Admission: No results found.  Impression/Recommendations Principal Problem:   S/P TKR (total knee replacement) using cement,  right Active Problems:   Impaired glucose tolerance   HTN (hypertension)   DVT (deep venous thrombosis) (HCC)  S/p TKR -Patient is s/p R TKR -He was doing well from this standpoint but developed a DVT (see below) -Once he starts anticoagulation (either today or tomorrow), it is reasonable to resume PT and then likely d/c to home - will defer to PT/Dr. Otelia SergeantNitka  Acute RLE DVT -Suggest initiating treatment with NOAC therapy -I have asked the Case Manager to ascertain most cost-effective treatment option based on his insurance plan -Once this is established, would suggest beginning therapy for 3 months -Any additional VTE would require lifelong anticoagulation -He does not have any respiratory symptoms at this time to suggest pulmonary embolism  HTN -Continue Cozaar (which is also appropriate given his stable stage 3 CKD)  Impaired glucose tolerance -Prior A1c 5.6 -Needs ongoing diet/exercise lifestyle changes - which may be easier once he is mobile from his knee replacement    Thank you for this consultation.  TR[facility] will sign off at this time.  Please feel free to reconsult should any additional questions or concerns arise.   Time Spent: 50 minutes  Jonah BlueJennifer Corianna Avallone M.D. [facility] 07/28/2018, 3:13 PM

## 2018-07-28 NOTE — Plan of Care (Signed)
  Problem: Education: Goal: Knowledge of General Education information will improve Description Including pain rating scale, medication(s)/side effects and non-pharmacologic comfort measures Outcome: Progressing   Problem: Clinical Measurements: Goal: Ability to maintain clinical measurements within normal limits will improve Outcome: Progressing   Problem: Activity: Goal: Risk for activity intolerance will decrease Outcome: Progressing   Problem: Elimination: Goal: Will not experience complications related to bowel motility Outcome: Progressing   Problem: Pain Managment: Goal: General experience of comfort will improve Outcome: Progressing   Problem: Safety: Goal: Ability to remain free from injury will improve Outcome: Progressing   

## 2018-07-28 NOTE — Progress Notes (Signed)
PT Cancellation Note  Patient Details Name: Joseph FarberRobert M Stemler MRN: [id number] DOB: Oct 26, 1951   Cancelled Treatment:    Reason Eval/Treat Not Completed: Medical issues which prohibited therapy   New +DVT in R Soleal Vein;  We hold therex and mobilizing until anticoagulation therapy;   Will follow;   Van ClinesHolly Nely Dedmon, PT  Acute Rehabilitation Services Pager [phone] Office [phone]    Levi AlandHolly H Jamilynn Whitacre 07/28/2018, 2:21 PM

## 2018-07-29 DIAGNOSIS — D62 Acute posthemorrhagic anemia: Secondary | ICD-10-CM | POA: Diagnosis not present

## 2018-07-29 LAB — CBC
HEMATOCRIT: 33.7 % — AB (ref 39.0–52.0)
Hemoglobin: 10.5 g/dL — ABNORMAL LOW (ref 13.0–17.0)
MCH: 30.3 pg (ref 26.0–34.0)
MCHC: 31.2 g/dL (ref 30.0–36.0)
MCV: 97.4 fL (ref 78.0–100.0)
Platelets: 162 10*3/uL (ref 150–400)
RBC: 3.46 MIL/uL — AB (ref 4.22–5.81)
RDW: 13 % (ref 11.5–15.5)
WBC: 9.1 10*3/uL (ref 4.0–10.5)

## 2018-07-29 MED ORDER — APIXABAN 5 MG PO TABS: 10.0000 mg | ORAL_TABLET | Freq: Two times a day (BID) | ORAL | 0 refills | Status: DC

## 2018-07-29 MED ORDER — OXYCODONE HCL ER 20 MG PO T12A: 20.0000 mg | EXTENDED_RELEASE_TABLET | Freq: Two times a day (BID) | ORAL | 0 refills | Status: DC

## 2018-07-29 MED ORDER — GABAPENTIN 300 MG PO CAPS: 300.0000 mg | ORAL_CAPSULE | Freq: Two times a day (BID) | ORAL | 2 refills | Status: DC

## 2018-07-29 MED ORDER — APIXABAN 5 MG PO TABS: 5.0000 mg | ORAL_TABLET | Freq: Two times a day (BID) | ORAL | 0 refills | Status: DC

## 2018-07-29 MED ORDER — FERROUS GLUCONATE 324 (38 FE) MG PO TABS: 324.0000 mg | ORAL_TABLET | Freq: Two times a day (BID) | ORAL | 1 refills | Status: DC

## 2018-07-29 MED ORDER — OXYCODONE HCL 5 MG PO TABS: 5.0000 mg | ORAL_TABLET | ORAL | 0 refills | Status: DC | PRN

## 2018-07-29 MED ORDER — ONDANSETRON HCL 4 MG PO TABS: 4.0000 mg | ORAL_TABLET | Freq: Four times a day (QID) | ORAL | 1 refills | Status: DC | PRN

## 2018-07-29 MED ORDER — DOCUSATE SODIUM 100 MG PO CAPS: 100.0000 mg | ORAL_CAPSULE | Freq: Two times a day (BID) | ORAL | 0 refills | Status: DC

## 2018-07-29 MED ORDER — FERROUS GLUCONATE 324 (38 FE) MG PO TABS
324.0000 mg | ORAL_TABLET | Freq: Two times a day (BID) | ORAL | Status: DC
  Administered 2018-07-29: 324 mg via ORAL
  Filled 2018-07-29: qty 1

## 2018-07-29 MED ORDER — LACTATED RINGERS IV BOLUS
500.0000 mL | Freq: Once | INTRAVENOUS | Status: AC
  Administered 2018-07-29: 500 mL via INTRAVENOUS

## 2018-07-29 NOTE — Anesthesia Postprocedure Evaluation (Signed)
Anesthesia Post Note  Patient: [name]  Procedure(s) Performed: RIGHT TOTAL KNEE ARTHROPLASTY (Right Knee)     Patient location during evaluation: PACU Anesthesia Type: Regional, MAC and Spinal Level of consciousness: awake and alert Pain management: pain level controlled Vital Signs Assessment: post-procedure vital signs reviewed and stable Respiratory status: spontaneous breathing, nonlabored ventilation, respiratory function stable and patient connected to nasal cannula oxygen Cardiovascular status: stable and blood pressure returned to baseline Postop Assessment: no apparent nausea or vomiting and spinal receding Anesthetic complications: no    Last Vitals:  Vitals:   07/28/18 2015 07/29/18 0552  BP:  122/77  Pulse:  90  Resp:  15  Temp: 36.8 C 36.7 C  SpO2:  96%    Last Pain:  Vitals:   07/29/18 0552  TempSrc: Oral  PainSc:                  [name]

## 2018-07-29 NOTE — Progress Notes (Signed)
Physical Therapy Treatment Patient Details Name: Joseph FarberRobert M [name] MRN: [id number] DOB: May 06, 1951 Today's Date: 07/29/2018    History of Present Illness Pt is a 67 y/o male s/p elective R TKA. PMH includes HTN.     PT Comments    Pt limited this session secondary to orthostatics. BP at 111/72 mmHg in supine, 108/70 mmHg in sitting, and 84/65 mmHg in standing. RN and MD notified. Requiring min guard A with RW to stand. Pt very frustrated that PT would not further mobility, however, educated about precautions related to orthostatic BPs. Will reattempt later in afternoon to progress mobility. Will continue to follow acutely to maximize functional mobility independence and safety.    Follow Up Recommendations  Follow surgeon's recommendation for DC plan and follow-up therapies;Supervision for mobility/OOB     Equipment Recommendations  None recommended by PT    Recommendations for Other Services OT consult     Precautions / Restrictions Precautions Precautions: Knee;Fall Precaution Booklet Issued: Yes (comment) Precaution Comments: Reviewed seated HEP  Required Braces or Orthoses: Knee Immobilizer - Right Restrictions Weight Bearing Restrictions: Yes RLE Weight Bearing: Weight bearing as tolerated    Mobility  Bed Mobility Overal bed mobility: Needs Assistance Bed Mobility: Supine to Sit;Sit to Supine     Supine to sit: Supervision Sit to supine: Supervision   General bed mobility comments: Pt using gait belt as leg lifter and required increased time and supervision to perform bed mobility tasks.   Transfers Overall transfer level: Needs assistance Equipment used: Rolling walker (2 wheeled) Transfers: Sit to/from Stand Sit to Stand: Min guard         General transfer comment: Verbal cues for hand placement. Pt orthostatic in standing (see vitals flowsheet) so further mobility deferred. Pt very frustrated that PT would not continue mobility tasks, however, educated about  necessary precautions related to orthostatic BP.   Ambulation/Gait                 Stairs             Wheelchair Mobility    Modified Rankin (Stroke Patients Only)       Balance Overall balance assessment: Needs assistance Sitting-balance support: Feet supported;No upper extremity supported Sitting balance-Leahy Scale: Good     Standing balance support: Bilateral upper extremity supported;During functional activity Standing balance-Leahy Scale: Poor Standing balance comment: heavy reliance on B UE support                             Cognition Arousal/Alertness: Awake/alert Behavior During Therapy: WFL for tasks assessed/performed Overall Cognitive Status: Within Functional Limits for tasks assessed                                        Exercises Total Joint Exercises Long Arc QuadBarbaraann [name]: AAROM;Right;10 reps;Seated Knee Flexion: AROM;Right;10 reps;Seated    General Comments General comments (skin integrity, edema, etc.): BP at 111/72 mmHg in supine, 108/70 mmHg in sitting, and 84/65 mmHg in standing. RN and MD notified.       Pertinent Vitals/Pain Pain Assessment: Faces Faces Pain Scale: Hurts a little bit Pain Location: R knee Pain Descriptors / Indicators: Aching;Operative site guarding Pain Intervention(s): Limited activity within patient's tolerance;Monitored during session;Repositioned    Home Living  Prior Function            PT Goals (current goals can now be found in the care plan section) Acute Rehab PT Goals Patient Stated Goal: to go home after PT PT Goal Formulation: With patient Time For Goal Achievement: 08/09/18 Potential to Achieve Goals: Good Progress towards PT goals: Progressing toward goals    Frequency    7X/week      PT Plan Current plan remains appropriate    Co-evaluation              AM-PAC PT "6 Clicks" Daily Activity  Outcome Measure  Difficulty  turning over in bed (including adjusting bedclothes, sheets and blankets)?: A Little Difficulty moving from lying on back to sitting on the side of the bed? : A Little Difficulty sitting down on and standing up from a chair with arms (e.g., wheelchair, bedside commode, etc,.)?: Unable Help needed moving to and from a bed to chair (including a wheelchair)?: A Little Help needed walking in hospital room?: A Little Help needed climbing 3-5 steps with a railing? : A Lot 6 Click Score: 15    End of Session Equipment Utilized During Treatment: Gait belt Activity Tolerance: Treatment limited secondary to medical complications (Comment)(orthostatics ) Patient left: in bed;with call bell/phone within reach Nurse Communication: Mobility status;Other (comment)(orthostatics ) PT Visit Diagnosis: Unsteadiness on feet (R26.81);Muscle weakness (generalized) (M62.81);Pain Pain - Right/Left: Right Pain - part of body: Knee     Time: 1403-1430 PT Time Calculation (min) (ACUTE ONLY): 27 min  Charges:  $Therapeutic Activity: 23-37 mins                     [name], PT, DPT  Acute Rehabilitation Services  Pager: [phone]    [name] 07/29/2018, 2:42 PM

## 2018-07-29 NOTE — Progress Notes (Signed)
Occupational Therapy Treatment Patient Details Name: [name] MRN: [id number] DOB: [birth year] Today's Date: 07/29/2018    History of present illness Pt is a 67 y/o male s/p elective R TKA. PMH includes HTN.    OT comments  Pt with mild dizziness upon sitting EOB, but not like this morning per his report. Pt educated in use of gait belt as leg lifter and demonstrated ability to perform bed mobility modified independently. Reinforced LB bathing and dressing techniques as well as tub transfer with pt verbalizing or demonstrating understanding. Pt is eager to go home.  Follow Up Recommendations  No OT follow up;Supervision - Intermittent    Equipment Recommendations  None recommended by OT    Recommendations for Other Services      Precautions / Restrictions Precautions Precautions: Knee;Fall Required Braces or Orthoses: Knee Immobilizer - Right Restrictions Weight Bearing Restrictions: Yes RLE Weight Bearing: Weight bearing as tolerated       Mobility Bed Mobility Overal bed mobility: Needs Assistance Bed Mobility: Supine to Sit;Sit to Supine     Supine to sit: Supervision Sit to supine: Supervision   General bed mobility comments: pt unable to raise R LE, gave pt gait belt and instructed in how to use as a leg lifter.  Transfers   Equipment used: Rolling walker (2 wheeled) Transfers: Sit to/from Stand Sit to Stand: [employer] transfer comment: cues for technique/hand placement with RW    Balance Overall balance assessment: Needs assistance   Sitting balance-Leahy Scale: Good       Standing balance-Leahy Scale: Poor                             ADL either performed or assessed with clinical judgement   ADL Overall ADL's : Needs assistance/impaired     Grooming: Set up;Sitting;Wash/dry hands           Upper Body Dressing : Set up;Sitting   Lower Body Dressing: Minimal assistance;Sit to/from stand Lower Body  Dressing Details (indicate cue type and reason): educated to dress operated leg first, assisted to don shorts Toilet Transfer: Immunologist Details (indicate cue type and reason): stood to use urinal Toileting- Architect and Hygiene: Min guard;Sit to/from stand     Tub/Shower Transfer Details (indicate cue type and reason): Reviewed technique for shower transfer.   General ADL Comments: Issued gait belt to use to assist R LE in and out of bed.     Vision       Perception     Praxis      Cognition Arousal/Alertness: Awake/alert Behavior During Therapy: WFL for tasks assessed/performed Overall Cognitive Status: Within Functional Limits for tasks assessed                                          Exercises     Shoulder Instructions       General Comments      Pertinent Vitals/ Pain       Pain Assessment: Faces Faces Pain Scale: Hurts a little bit Pain Location: R knee Pain Descriptors / Indicators: Aching;Operative site guarding Pain Intervention(s): Monitored during session;Repositioned  Home Living  Prior Functioning/Environment              Frequency  Min 2X/week        Progress Toward Goals  OT Goals(current goals can now be found in the care plan section)  Progress towards OT goals: Progressing toward goals  Acute Rehab OT Goals Patient Stated Goal: to go home after PT OT Goal Formulation: With patient Time For Goal Achievement: 08/03/18 Potential to Achieve Goals: Good  Plan Discharge plan remains appropriate    Co-evaluation                 AM-PAC PT "6 Clicks" Daily Activity     Outcome Measure   Help from another person eating meals?: None Help from another person taking care of personal grooming?: A Little Help from another person toileting, which includes using toliet, bedpan, or urinal?: A Little Help from another person  bathing (including washing, rinsing, drying)?: A Little Help from another person to put on and taking off regular upper body clothing?: None Help from another person to put on and taking off regular lower body clothing?: A Little 6 Click Score: 20    End of Session Equipment Utilized During Treatment: Rolling walker;Gait belt CPM Right Knee CPM Right Knee: Off  OT Visit Diagnosis: Unsteadiness on feet (R26.81);Pain   Activity Tolerance Patient tolerated treatment well   Patient Left in bed;with call bell/phone within reach;in CPM   Nurse Communication          Time: [id number] OT Time Calculation (min): 39 min  Charges: OT General Charges $OT Visit: 1 Visit OT Treatments $Self Care/Home Management : 23-37 mins  07/29/2018 Martie RoundJulie Jenson Beedle, OTR/L Pager: [phone]   Iran PlanasMayberry, [place] BailiffJulie Lynn 07/29/2018, 2:03 PM

## 2018-07-29 NOTE — Progress Notes (Signed)
Patient ID: Joseph FarberRobert M Tritschler, male   DOB: 07-Sep-1951, 67 y.o.   MRN: [id number] Orthostatic hypotension, was given antihypertensive agent losartin this AM. Mild anemia Hgb 10.7. On iron. He will hold his antihypertension agents until seen by  Dr.[name]. Wife is able to check his BP in AM and if he starts to become hypertensive SBP 140-150 then he can restart his antihypertension med. PT to see this afternoon post 500cc LR fluid bolus. Advised to drink 5 glasses of water per day.  Okay to diischarge post PT.

## 2018-07-29 NOTE — Progress Notes (Signed)
Physical Therapy Treatment Patient Details Name: [name] MRN: [id number] DOB: 11-16-1951 Today's Date: 07/29/2018    History of Present Illness Pt is a 67 y/o male s/p elective R TKA. PMH includes HTN.     PT Comments    Patient received in bed, pleasant and willing to work with skilled PT services this morning. He requires MinA for functional bed mobility for LE management, and was able to complete functional transfers with close Min guard and extended time with RW, however became severely dizzy in standing requiring return to supine. Allowed BP to normalize in supine, then proceeded to check orthostatics as follows:  Supine 126/79 Sitting 99/67, symptomatic  Standing 78/53, symptomatic   Returned patient to supine with MinA and provided education regarding relevance of orthostatic measures as well as need for follow-up with MD/RN, limitations of session today. Patient left in bed with all needs met this morning. RN and MD aware of orthostatics and limitations of this morning's PT session.   Follow Up Recommendations  Follow surgeon's recommendation for DC plan and follow-up therapies;Supervision for mobility/OOB     Equipment Recommendations  None recommended by PT    Recommendations for Other Services OT consult     Precautions / Restrictions Precautions Precautions: Knee Precaution Booklet Issued: Yes (comment) Precaution Comments: Reviewed knee precautions  Required Braces or Orthoses: Knee Immobilizer - Right Restrictions Weight Bearing Restrictions: Yes RLE Weight Bearing: Weight bearing as tolerated    Mobility  Bed Mobility Overal bed mobility: Needs Assistance Bed Mobility: Supine to Sit;Sit to Supine     Supine to sit: Min assist Sit to supine: Min assist   General bed mobility comments: MinA for LE management due to pain   Transfers Overall transfer level: Needs assistance Equipment used: Rolling walker (2 wheeled) Transfers: Sit to/from  Stand Sit to Stand: Min guard         General transfer comment: close min guard for safety and steadying, extended time   Ambulation/Gait             General Gait Details: unable to perform due to orthostatics    Stairs             Wheelchair Mobility    Modified Rankin (Stroke Patients Only)       Balance Overall balance assessment: Needs assistance Sitting-balance support: Feet supported;No upper extremity supported Sitting balance-Leahy Scale: Good     Standing balance support: Bilateral upper extremity supported;During functional activity Standing balance-Leahy Scale: Poor Standing balance comment: heavy reliance on B UE support                             Cognition Arousal/Alertness: Awake/alert Behavior During Therapy: WFL for tasks assessed/performed Overall Cognitive Status: Within Functional Limits for tasks assessed                                        Exercises Total Joint Exercises Goniometric ROM: R knee ROM 14 degrees extension, 52 degrees flexion     General Comments General comments (skin integrity, edema, etc.): severely orthostatic during session, MD aware       Pertinent Vitals/Pain Pain Assessment: Faces Faces Pain Scale: Hurts a little bit Pain Location: R knee Pain Descriptors / Indicators: Aching;Operative site guarding Pain Intervention(s): Limited activity within patient's tolerance;Monitored during session    Home Living  Prior Function            PT Goals (current goals can now be found in the care plan section) Acute Rehab PT Goals Patient Stated Goal: "I want to do what you want me to do" PT Goal Formulation: With patient Time For Goal Achievement: 08/09/18 Potential to Achieve Goals: Good Progress towards PT goals: Progressing toward goals    Frequency    7X/week      PT Plan Current plan remains appropriate    Co-evaluation               AM-PAC PT "6 Clicks" Daily Activity  Outcome Measure  Difficulty turning over in bed (including adjusting bedclothes, sheets and blankets)?: A Little Difficulty moving from lying on back to sitting on the side of the bed? : A Little Difficulty sitting down on and standing up from a chair with arms (e.g., wheelchair, bedside commode, etc,.)?: A Little Help needed moving to and from a bed to chair (including a wheelchair)?: A Little Help needed walking in hospital room?: A Little Help needed climbing 3-5 steps with a railing? : A Lot 6 Click Score: 17    End of Session   Activity Tolerance: Treatment limited secondary to medical complications (Comment)(orthostatics ) Patient left: in bed;with call bell/phone within reach Nurse Communication: Mobility status;Other (comment)(orthostatics ) PT Visit Diagnosis: Unsteadiness on feet (R26.81);Muscle weakness (generalized) (M62.81);Pain Pain - Right/Left: Right Pain - part of body: Knee     Time: 0160-1093 PT Time Calculation (min) (ACUTE ONLY): 26 min  Charges:  $Therapeutic Activity: 23-37 mins                     Deniece Ree PT, DPT, CBIS  Supplemental Physical Therapist [place]   Pager [phone]

## 2018-07-29 NOTE — Plan of Care (Signed)
Problem: Education: Goal: Knowledge of General Education information will improve Description Including pain rating scale, medication(s)/side effects and non-pharmacologic comfort measures Outcome: Progressing   Problem: Clinical Measurements: Goal: Diagnostic test results will improve Outcome: Progressing   Problem: Activity: Goal: Risk for activity intolerance will decrease Outcome: Progressing   Problem: Coping: Goal: Level of anxiety will decrease Outcome: Progressing   Problem: Elimination: Goal: Will not experience complications related to bowel motility Outcome: Progressing Goal: Will not experience complications related to urinary retention Outcome: Progressing   Problem: Pain Managment: Goal: General experience of comfort will improve Outcome: Progressing   Problem: Safety: Goal: Ability to remain free from injury will improve Outcome: Progressing   Problem: Skin Integrity: Goal: Risk for impaired skin integrity will decrease Outcome: Progressing   Problem: Education: Goal: Knowledge of the prescribed therapeutic regimen will improve Outcome: Progressing   Problem: Activity: Goal: Ability to avoid complications of mobility impairment will improve Outcome: Progressing Goal: Range of joint motion will improve Outcome: Progressing   Problem: Clinical Measurements: Goal: Postoperative complications will be avoided or minimized Outcome: Progressing

## 2018-07-29 NOTE — Care Management (Signed)
Per Dr. Ophelia CharterYates request coverage for anticoags :  07-29-18  BENEFITS CHECK :  # 4.   S[facility]W  [facility]  [employer] # [phone]  1. ELIQUIS COVER- YES PRIOR APPROVAL- YES  # [phone] FOR EXCEPTION  2. ELIQUIS  COVER- YES PRIOR APPROVAL- YES # [phone] FOR EXCEPTION  3. XARELTO   COVER- YES PRIOR APPROVAL- YES # [id number] FOE EXCEPTION  4. XARELTO   COVER- YES PRIOR APPROVAL- YES # [phone] FOR EXCEPTION  5. PRADAXA   COVER- NOT COVER AT ALL PRIOR APPROVAL- YES # [phone]  PREFERRED PHARMACY : YES [facility]

## 2018-07-29 NOTE — Progress Notes (Signed)
07/29/18 1612  PT Visit Information  Last PT Received On 07/29/18  Assistance Needed +1  History of Present Illness Pt is a 67 y/o male s/p elective R TKA. Pt also found to have DVT following surgery and was started on anticoagulation. PMH includes HTN.   Subjective Data  Patient Stated Goal to go home after PT  Precautions  Precautions Knee;Fall  Precaution Booklet Issued Yes (comment)  Precaution Comments Reviewed supine HEP   Required Braces or Orthoses Knee Immobilizer - Right  Restrictions  Weight Bearing Restrictions Yes  RLE Weight Bearing WBAT  Pain Assessment  Pain Assessment Faces  Faces Pain Scale 2  Pain Location R knee  Pain Descriptors / Indicators Aching;Operative site guarding  Pain Intervention(s) Limited activity within patient's tolerance;Monitored during session;Repositioned  Cognition  Arousal/Alertness Awake/alert  Behavior During Therapy WFL for tasks assessed/performed  Overall Cognitive Status Within Functional Limits for tasks assessed  Bed Mobility  Overal bed mobility Needs Assistance  Bed Mobility Supine to Sit;Sit to Supine  Supine to sit Supervision  Sit to supine Supervision  General bed mobility comments Pt using gait belt as leg lifter and required increased time and supervision to perform bed mobility tasks.   Transfers  Overall transfer level Needs assistance  Equipment used Rolling walker (2 wheeled)  Transfers Sit to/from Stand  Sit to Stand Min guard  General transfer comment Min guard for safety. Verbal cues for safe hand placement. BP improved in standing and no dizziness reported.   Ambulation/Gait  Ambulation/Gait assistance Min guard;Supervision  Gait Distance (Feet) 50 Feet  Assistive device Rolling walker (2 wheeled)  Gait Pattern/deviations Step-to pattern;Antalgic  General Gait Details Slow, guarded gait. Verbal cues for knee extension during gait. Pt asymptomatic throughout. Verbal cues for sequencing using RW.   Gait  velocity Decreased   Stairs Yes  Stairs assistance Min guard  Stair Management Step to pattern;No rails;With walker;Forwards;Backwards  Number of Stairs 3  General stair comments Overall steady stair navigation. Verbal cues for LE sequencing. Educated and demonstrated how wife needed to assist while blocking RW. No LOB noted.   Balance  Overall balance assessment Needs assistance  Sitting-balance support Feet supported;No upper extremity supported  Sitting balance-Leahy Scale Good  Standing balance support Bilateral upper extremity supported;During functional activity  Standing balance-Leahy Scale Poor  Standing balance comment Reliant on BUE support.   Exercises  Exercises Total Joint  Total Joint Exercises  Ankle Circles/Pumps AROM;Both;20 reps  Quad Sets AROM;Right;10 reps  PT - End of Session  Equipment Utilized During Treatment Gait belt  Activity Tolerance Patient tolerated treatment well  Patient left in chair;with call bell/phone within reach  Nurse Communication Mobility status  CPM Right Knee  CPM Right Knee Off   PT - Assessment/Plan  PT Plan Current plan remains appropriate  PT Visit Diagnosis Unsteadiness on feet (R26.81);Muscle weakness (generalized) (M62.81);Pain  Pain - Right/Left Right  Pain - part of body Knee  PT Frequency (ACUTE ONLY) 7X/week  Recommendations for Other Services OT consult  Follow Up Recommendations Follow surgeon's recommendation for DC plan and follow-up therapies;Supervision for mobility/OOB  PT equipment None recommended by PT  AM-PAC PT "6 Clicks" Daily Activity Outcome Measure  Difficulty turning over in bed (including adjusting bedclothes, sheets and blankets)? 3  Difficulty moving from lying on back to sitting on the side of the bed?  3  Difficulty sitting down on and standing up from a chair with arms (e.g., wheelchair, bedside commode, etc,.)? 1  Help  needed moving to and from a bed to chair (including a wheelchair)? 3  Help needed  walking in hospital room? 3  Help needed climbing 3-5 steps with a railing?  3  6 Click Score 16  Mobility G Code  CK  PT Goal Progression  Progress towards PT goals Progressing toward goals  Acute Rehab PT Goals  PT Goal Formulation With patient  Time For Goal Achievement 08/09/18  Potential to Achieve Goals Good  PT Time Calculation  PT Start Time (ACUTE ONLY) 1542  PT Stop Time (ACUTE ONLY) 1609  PT Time Calculation (min) (ACUTE ONLY) 27 min  PT General Charges  $$ ACUTE PT VISIT 1 Visit  PT Treatments  $Gait Training 23-37 mins   Pt with much improved tolerance for mobility this session as BP had improved. Pt asymptomatic throughout gait and stair training and was overall steadying using RW. Practiced gait/stairs with min guard A. Educated about how wife needed to assist at home and administered handout. Pt reports he feels comfortable with all mobility tasks and is eager to d/c home. Will continue to follow acutely to maximize functional mobility independence and safety.   Gladys DammeBrittany Bernestine Holsapple, PT, DPT  Acute Rehabilitation Services  Pager: [phone]

## 2018-07-29 NOTE — Progress Notes (Signed)
     Subjective: 3 Days Post-Op Procedure(s) (LRB): RIGHT TOTAL KNEE ARTHROPLASTY (Right) Awake, alert and oriented x 4. Started on eliquis and PT restarted with CPM. Apparently CPM not approved by his insurance for home use. Will nee adequate HHN follow up for PT and incision. See primary care in 1-2 weeks due to need for eliquis due to DVT.  Patient reports pain as moderate.    Objective:   VITALS:  Temp:  [98.1 F (36.7 C)-98.8 F (37.1 C)] 98.1 F (36.7 C) (08/29 0552) Pulse Rate:  [90-100] 90 (08/29 0552) Resp:  [15-20] 15 (08/29 0552) BP: (116-122)/(64-77) 122/77 (08/29 0552) SpO2:  [93 %-99 %] 93 % (08/29 0817)  Neurologically intact ABD soft Neurovascular intact Sensation intact distally Intact pulses distally Dorsiflexion/Plantar flexion intact Incision: scant drainage No cellulitis present Compartment soft   LABS Recent Labs    07/27/18 0603 07/28/18 0405 07/29/18 0528  HGB 11.3* 10.9* 10.5*  WBC 10.3 9.8 9.1  PLT 180 165 162   Recent Labs    07/27/18 0603  NA 137  K 4.3  CL 107  CO2 24  BUN 17  CREATININE 1.24  GLUCOSE 142*   No results for input(s): LABPT, INR in the last 72 hours.   Assessment/Plan: 3 Days Post-Op Procedure(s) (LRB): RIGHT TOTAL KNEE ARTHROPLASTY (Right)  Advance diet Up with therapy D/C IV fluids Discharge home with home health  Following medicine evaluation I believe he will be ready for discharge home.   Joseph BrownsJames Hermenia [name] 07/29/2018, 8:22 AMPatient ID: [name], male   DOB: 17-Feb-1951, 67 y.o.   MRN: [id number]

## 2018-07-29 NOTE — Progress Notes (Signed)
AVS given and reviewed with pt. Printed prescriptions provided. All questions answered to satisfaction. Pt to be escorted off the unit via wheelchair by staff member.  

## 2018-07-30 ENCOUNTER — Telehealth (INDEPENDENT_AMBULATORY_CARE_PROVIDER_SITE_OTHER): Payer: Self-pay

## 2018-07-30 ENCOUNTER — Telehealth (INDEPENDENT_AMBULATORY_CARE_PROVIDER_SITE_OTHER): Payer: Self-pay | Admitting: Radiology

## 2018-07-30 ENCOUNTER — Telehealth (INDEPENDENT_AMBULATORY_CARE_PROVIDER_SITE_OTHER): Payer: Self-pay | Admitting: Specialist

## 2018-07-30 NOTE — Telephone Encounter (Signed)
I called [facility], Oxycodone is approved and did not need a prior auth.  Oxycotin did need prior auth, this was approved, Case # [id number], coverage dates 06/30/2018-07/30/2019-------[facility] [place], she states that the patients wife dropped off rx after 5 pm, and about 630 she notified her that 2 of the 3 rx's would need prior approval (the OxyContin and eliquis) and that she (the pharm) would need to notify out office and that it would in the morning due to being closed at this time she refused to wait and [facility], she took all the hard copies of rx's and left the store, Kirt BoysMolly states that she did advise her that the Oxycodone was covered for a $10 copay and Darl PikesSusan (pts wife refused it).

## 2018-07-30 NOTE — Telephone Encounter (Signed)
Auth for Eliquis has been approved, case ID# [id number], cover dates: 06/30/18-09/28/2018--pat has been advised

## 2018-07-30 NOTE — Telephone Encounter (Signed)
Talked with patient and advised him that Prior Authorization was being worked on and would be faxed once completed.  Per Howell Ruckshristy S.W. Prior Authorization has been faxed [facility]

## 2018-07-30 NOTE — Telephone Encounter (Signed)
Medication Refill   Oxycodone( oxy IR/Roxicodone)5mg  immediate  Release tablet  Oxycodone(oxycontin)20 mg 12hr tablet    Prior Authorization needed  [facility]  [phone]   Patient wife stated she would like to hear back from the office within twenty minutes or she will come into the office.

## 2018-07-30 NOTE — Telephone Encounter (Signed)
Error

## 2018-07-30 NOTE — Telephone Encounter (Signed)
FYI   I have spoken with wife when she came into the office.  She was upset.  She feels this was bad care on our part that patient was unable to get his pain meds last night.  Upon calling to check on authorizations of all three meds, the pharmacy advised us that the oxycodone Rx had been approved last night, and could have been picked up.  Not sure what happened there.  I do know wife had taken the Rx's to a [facility], and something happened and she then [facility].  The oxycontin and eliquis needed Berkley Harveyauth, Neysa BonitoChristy had not received communication from the pharmacy until mid morning today that this Berkley Harveyauth was needed.  She faxed/called [facility] and obtained the auth for both meds.  IC patient discussed with him and advised.  [facility].  I advised him to call me if any further was needed.  Dr Otelia SergeantNitka is aware of this as well.

## 2018-08-03 ENCOUNTER — Telehealth (INDEPENDENT_AMBULATORY_CARE_PROVIDER_SITE_OTHER): Payer: Self-pay | Admitting: Radiology

## 2018-08-03 ENCOUNTER — Other Ambulatory Visit (INDEPENDENT_AMBULATORY_CARE_PROVIDER_SITE_OTHER): Payer: Self-pay | Admitting: Specialist

## 2018-08-03 DIAGNOSIS — Z96651 Presence of right artificial knee joint: Secondary | ICD-10-CM

## 2018-08-03 NOTE — Telephone Encounter (Signed)
Patient called to ask if he was supposed to have started any PT at this point.  I have called [facility] and [facility], neither of them received a referral for this patient.   At this time, patient has been working on flexion/extension of the knee himself.  He is walking and bending knee when walking.  He is going to start SLR today to try and strengthen leg.   He does not want to have HHPT come out at this time, as he is 8 days PO.  I have scheduled an appt for him on 08/09/18 for eval.   Please advise if this is ok?  Then maybe refer him to OP PT at that time?

## 2018-08-04 NOTE — Telephone Encounter (Signed)
Patient advised.

## 2018-08-04 NOTE — Telephone Encounter (Signed)
Order for home therapy placed with [facility]. Please call order to [facility]. Review of in patient chart indicates post op Request was made for [facility] for PT post hospitalization.

## 2018-08-04 NOTE — Telephone Encounter (Signed)
He put this order in last night

## 2018-08-06 ENCOUNTER — Telehealth (INDEPENDENT_AMBULATORY_CARE_PROVIDER_SITE_OTHER): Payer: Self-pay | Admitting: Specialist

## 2018-08-06 NOTE — Telephone Encounter (Signed)
Elon Jester PT [facility] is requesting physical therapy orders  1 x for 1 week 3 x for 1 week 2 x for 1 week  CB # [phone]

## 2018-08-06 NOTE — Telephone Encounter (Signed)
Called and gave verbal approval for orders

## 2018-08-09 ENCOUNTER — Encounter (INDEPENDENT_AMBULATORY_CARE_PROVIDER_SITE_OTHER): Payer: Self-pay | Admitting: Specialist

## 2018-08-09 ENCOUNTER — Ambulatory Visit (INDEPENDENT_AMBULATORY_CARE_PROVIDER_SITE_OTHER): Payer: Managed Care, Other (non HMO)

## 2018-08-09 ENCOUNTER — Ambulatory Visit (INDEPENDENT_AMBULATORY_CARE_PROVIDER_SITE_OTHER): Payer: Managed Care, Other (non HMO) | Admitting: Specialist

## 2018-08-09 VITALS — BP 128/82 | HR 101 | Ht 73.0 in | Wt 232.0 lb

## 2018-08-09 DIAGNOSIS — I824Z1 Acute embolism and thrombosis of unspecified deep veins of right distal lower extremity: Secondary | ICD-10-CM

## 2018-08-09 DIAGNOSIS — Z96651 Presence of right artificial knee joint: Secondary | ICD-10-CM

## 2018-08-09 MED ORDER — OXYCODONE HCL ER 20 MG PO T12A: 20.0000 mg | EXTENDED_RELEASE_TABLET | Freq: Two times a day (BID) | ORAL | 0 refills | Status: DC

## 2018-08-09 MED ORDER — OXYCODONE HCL 5 MG PO CAPS: 5.0000 mg | ORAL_CAPSULE | ORAL | 0 refills | Status: DC | PRN

## 2018-08-09 NOTE — Progress Notes (Signed)
Post-Op Visit Note   Patient: [name]           Date of Birth: 1951/10/05           MRN: [id number] Visit Date: 08/09/2018 PCP: Corwin Levins, MD   Assessment & Plan: 2 weeks post right total knee replacement.  Chief Complaint:  Chief Complaint  Patient presents with  . Right Knee - Routine Post Op   Visit Diagnoses:  1. S/P TKR (total knee replacement) using cement, right    Right knee -10 to 90 degrees. Right knee with moderate swelling, mild warmth. Radiographs with good position and alignment.  Plan:    Keep knee incision dry for 5 days post op then may wet while bathing. Therapy daily and CPM goal full extension and greater than 90 degrees flexion. Call if fever or chills or increased drainage. Go to ER if acutely short of breath or call for ambulance. Return for follow up in 2 weeks. May full weight bear on the surgical leg unless told otherwise. Use knee immobilizer until able to straight leg raise off bed with knee stable. In house walking for first 2 weeks. INSTRUCTIONS AFTER JOINT REPLACEMENT   o Remove items at home which could result in a fall. This includes throw rugs or furniture in walking pathways o ICE to the affected joint every three hours while awake for 30 minutes at a time, for at least the first 3-5 days, and then as needed for pain and swelling.  Continue to use ice for pain and swelling. You may notice swelling that will progress down to the foot and ankle.  This is normal after surgery.  Elevate your leg when you are not up walking on it.   o Continue to use the breathing machine you got in the hospital (incentive spirometer) which will help keep your temperature down.  It is common for your temperature to cycle up and down following surgery, especially at night when you are not up moving around and exerting yourself.  The breathing machine keeps your lungs expanded and your temperature down.   DIET:  As you were doing prior to  hospitalization, we recommend a well-balanced diet.  DRESSING / WOUND CARE / SHOWERING  You may shower.   ACTIVITY  o Increase activity slowly as tolerated, but follow the weight bearing instructions below.   o No driving for 6 weeks or until further direction given by your physician.  You cannot drive while taking narcotics.  o No lifting or carrying greater than 10 lbs. until further directed by your surgeon. o Avoid periods of inactivity such as sitting longer than an hour when not asleep. This helps prevent blood clots.  o You may return to work once you are authorized by your doctor.     WEIGHT BEARING   Weight bearing as tolerated with assist device (walker, cane, etc) as directed, use it as long as suggested by your surgeon or therapist, typically at least 4-6 weeks.   EXERCISES  Results after joint replacement surgery are often greatly improved when you follow the exercise, range of motion and muscle strengthening exercises prescribed by your doctor. Safety measures are also important to protect the joint from further injury. Any time any of these exercises cause you to have increased pain or swelling, decrease what you are doing until you are comfortable again and then slowly increase them. If you have problems or questions, call your caregiver or physical therapist for advice.  Rehabilitation is important following a joint replacement. After just a few days of immobilization, the muscles of the leg can become weakened and shrink (atrophy).  These exercises are designed to build up the tone and strength of the thigh and leg muscles and to improve motion. Often times heat used for twenty to thirty minutes before working out will loosen up your tissues and help with improving the range of motion but do not use heat for the first two weeks following surgery (sometimes heat can increase post-operative swelling).   These exercises can be done on a training (exercise) mat, on the  floor, on a table or on a bed. Use whatever works the best and is most comfortable for you.    Use music or television while you are exercising so that the exercises are a pleasant break in your day. This will make your life better with the exercises acting as a break in your routine that you can look forward to.   Perform all exercises about fifteen times, three times per day or as directed.  You should exercise both the operative leg and the other leg as well.  Exercises include:   . Quad Sets - Tighten up the muscle on the front of the thigh (Quad) and hold for 5-10 seconds.   . Straight Leg Raises - With your knee straight (if you were given a brace, keep it on), lift the leg to 60 degrees, hold for 3 seconds, and slowly lower the leg.  Perform this exercise against resistance later as your leg gets stronger.  . Leg Slides: Lying on your back, slowly slide your foot toward your buttocks, bending your knee up off the floor (only go as far as is comfortable). Then slowly slide your foot back down until your leg is flat on the floor again.  Lawanna Kobus Wings: Lying on your back spread your legs to the side as far apart as you can without causing discomfort.  . Hamstring Strength:  Lying on your back, push your heel against the floor with your leg straight by tightening up the muscles of your buttocks.  Repeat, but this time bend your knee to a comfortable angle, and push your heel against the floor.  You may put a pillow under the heel to make it more comfortable if necessary.   A rehabilitation program following joint replacement surgery can speed recovery and prevent re-injury in the future due to weakened muscles. Contact your doctor or a physical therapist for more information on knee rehabilitation.    CONSTIPATION  Constipation is defined medically as fewer than three stools per week and severe constipation as less than one stool per week.  Even if you have a regular bowel pattern at home, your  normal regimen is likely to be disrupted due to multiple reasons following surgery.  Combination of anesthesia, postoperative narcotics, change in appetite and fluid intake all can affect your bowels.   YOU MUST use at least one of the following options; they are listed in order of increasing strength to get the job done.  They are all available over the counter, and you may need to use some, POSSIBLY even all of these options:    Drink plenty of fluids (prune juice may be helpful) and high fiber foods Colace 100 mg by mouth twice a day  Senokot for constipation as directed and as needed Dulcolax (bisacodyl), take with full glass of water  Miralax (polyethylene glycol) once or twice a day as  needed.  If you have tried all these things and are unable to have a bowel movement in the first 3-4 days after surgery call either your surgeon or your primary doctor.    If you experience loose stools or diarrhea, hold the medications until you stool forms back up.  If your symptoms do not get better within 1 week or if they get worse, check with your doctor.  If you experience "the worst abdominal pain ever" or develop nausea or vomiting, please contact the office immediately for further recommendations for treatment.   ITCHING:  If you experience itching with your medications, try taking only a single pain pill, or even half a pain pill at a time.  You can also use Benadryl over the counter for itching or also to help with sleep.   TED HOSE STOCKINGS:  Use stockings on both legs until for at least 2 weeks or as directed by physician office. They may be removed at night for sleeping.  MEDICATIONS:  See your medication summary on the "After Visit Summary" that nursing will review with you.  You may have some home medications which will be placed on hold until you complete the course of blood thinner medication.  It is important for you to complete the blood thinner medication as prescribed.  PRECAUTIONS:   If you experience chest pain or shortness of breath - call 911 immediately for transfer to the hospital emergency department.   If you develop a fever greater that 101 F, purulent drainage from wound, increased redness or drainage from wound, foul odor from the wound/dressing, or calf pain - CONTACT YOUR SURGEON.                                                   FOLLOW-UP APPOINTMENTS:  If you do not already have a post-op appointment, please call the office for an appointment to be seen by your surgeon.  Guidelines for how soon to be seen are listed in your "After Visit Summary", but are typically between 1-4 weeks after surgery.  OTHER INSTRUCTIONS:   Knee Replacement:  Do not place pillow under knee, focus on keeping the knee straight while resting. CPM instructions: 0-90 degrees, 2 hours in the morning, 2 hours in the afternoon, and 2 hours in the evening. Place foam block, curve side up under heel at all times except when in CPM or when walking.  DO NOT modify, tear, cut, or change the foam block in any way.  MAKE SURE YOU:  . Understand these instructions.  . Get help right away if you are not doing well or get worse.    Thank you for letting us be a part of your medical care team.  It is a privilege we respect greatly.  We hope these instructions will help you stay on track for a fast and full recovery!    Follow-Up Instructions: Return in about 4 weeks (around 09/06/2018).   Orders:  Orders Placed This Encounter  Procedures  . XR Knee 1-2 Views Right   No orders of the defined types were placed in this encounter.   Imaging: No results found.  PMFS History: Patient Active Problem List   Diagnosis Date Noted  . Unilateral primary osteoarthritis, right knee 07/26/2018    Priority: High    Class: End Stage  . Anemia  due to blood loss, acute 07/29/2018    Priority: Medium    Class: Acute  . DVT (deep venous thrombosis) (HCC) 07/28/2018  . S/P TKR (total knee replacement)  using cement, right 07/26/2018  . Flu-like symptoms 01/23/2018  . Dyspnea 01/07/2018  . HTN (hypertension) 10/30/2016  . Cough 10/30/2016  . Asthma with exacerbation 10/30/2016  . Edema 11/22/2015  . Erectile dysfunction 01/17/2015  . Corneal abrasion, right, sequela 06/27/2014  . Encounter for well adult exam with abnormal findings 09/23/2011  . Impaired glucose tolerance 09/23/2011  . Hyperlipidemia 08/14/2010  . Extrinsic asthma 09/08/2009  . ALLERGIC RHINITIS 12/31/2007  . OBESITY 07/18/2007  . GERD 07/18/2007  . DEGENERATIVE JOINT DISEASE 07/18/2007   Past Medical History:  Diagnosis Date  . ALLERGIC RHINITIS 12/31/2007  . COUGH, CHRONIC 07/18/2007  . DEGENERATIVE JOINT DISEASE 07/18/2007  . Extrinsic asthma, unspecified 09/08/2009  . GERD 07/18/2007  . HYPERLIPIDEMIA 08/14/2010  . Hypertension   . Impaired glucose tolerance 09/23/2011  . OBESITY 07/18/2007    History reviewed. No pertinent family history.  Past Surgical History:  Procedure Laterality Date  . BACK SURGERY    . DG FEMUR LEFT  ([facility] HX)     rod placed  . TOOTH EXTRACTION    . TOTAL KNEE ARTHROPLASTY Right 07/26/2018   Procedure: RIGHT TOTAL KNEE ARTHROPLASTY;  Surgeon: Kerrin Champagne, MD;  Location: [facility] OR;  Service: Orthopedics;  Laterality: Right;   Social History   Occupational History  . Occupation: truck Hospital doctor  Tobacco Use  . Smoking status: Former Games developer  . Smokeless tobacco: Never Used  . Tobacco comment: Marijuana 30 years  Substance and Sexual Activity  . Alcohol use: Yes    Comment: occ  . Drug use: Never  . Sexual activity: Not on file

## 2018-08-09 NOTE — Patient Instructions (Signed)
Keep knee incision dry for 5 days post op then may wet while bathing. Therapy daily and CPM goal full extension and greater than 90 degrees flexion. Call if fever or chills or increased drainage. Go to ER if acutely short of breath or call for ambulance. Return for follow up in 2 weeks. May full weight bear on the surgical leg unless told otherwise. Use knee immobilizer until able to straight leg raise off bed with knee stable. In house walking for first 2 weeks. INSTRUCTIONS AFTER JOINT REPLACEMENT   o Remove items at home which could result in a fall. This includes throw rugs or furniture in walking pathways o ICE to the affected joint every three hours while awake for 30 minutes at a time, for at least the first 3-5 days, and then as needed for pain and swelling.  Continue to use ice for pain and swelling. You may notice swelling that will progress down to the foot and ankle.  This is normal after surgery.  Elevate your leg when you are not up walking on it.   o Continue to use the breathing machine you got in the hospital (incentive spirometer) which will help keep your temperature down.  It is common for your temperature to cycle up and down following surgery, especially at night when you are not up moving around and exerting yourself.  The breathing machine keeps your lungs expanded and your temperature down.   DIET:  As you were doing prior to hospitalization, we recommend a well-balanced diet.  DRESSING / WOUND CARE / SHOWERING  You may shower. NO SOAKING/SUBMERGING IN THE BATHTUB.   ACTIVITY  o Increase activity slowly as tolerated, but follow the weight bearing instructions below.   o No driving for 6 weeks or until further direction given by your physician.  You cannot drive while taking narcotics.  o No lifting or carrying greater than 10 lbs. until further directed by your surgeon. o Avoid periods of inactivity such as sitting longer than an hour when not asleep. This helps  prevent blood clots.  o You may return to work once you are authorized by your doctor.     WEIGHT BEARING   Weight bearing as tolerated with assist device (walker, cane, etc) as directed, use it as long as suggested by your surgeon or therapist, typically at least 4-6 weeks.   EXERCISES  Results after joint replacement surgery are often greatly improved when you follow the exercise, range of motion and muscle strengthening exercises prescribed by your doctor. Safety measures are also important to protect the joint from further injury. Any time any of these exercises cause you to have increased pain or swelling, decrease what you are doing until you are comfortable again and then slowly increase them. If you have problems or questions, call your caregiver or physical therapist for advice.   Rehabilitation is important following a joint replacement. After just a few days of immobilization, the muscles of the leg can become weakened and shrink (atrophy).  These exercises are designed to build up the tone and strength of the thigh and leg muscles and to improve motion. Often times heat used for twenty to thirty minutes before working out will loosen up your tissues and help with improving the range of motion but do not use heat for the first two weeks following surgery (sometimes heat can increase post-operative swelling).   These exercises can be done on a training (exercise) mat, on the floor, on a table  or on a bed. Use whatever works the best and is most comfortable for you.    Use music or television while you are exercising so that the exercises are a pleasant break in your day. This will make your life better with the exercises acting as a break in your routine that you can look forward to.   Perform all exercises about fifteen times, three times per day or as directed.  You should exercise both the operative leg and the other leg as well.  Exercises include:   . Quad Sets - Tighten up the  muscle on the front of the thigh (Quad) and hold for 5-10 seconds.   . Straight Leg Raises - With your knee straight (if you were given a brace, keep it on), lift the leg to 60 degrees, hold for 3 seconds, and slowly lower the leg.  Perform this exercise against resistance later as your leg gets stronger.  . Leg Slides: Lying on your back, slowly slide your foot toward your buttocks, bending your knee up off the floor (only go as far as is comfortable). Then slowly slide your foot back down until your leg is flat on the floor again.  Lawanna Kobus Wings: Lying on your back spread your legs to the side as far apart as you can without causing discomfort.  . Hamstring Strength:  Lying on your back, push your heel against the floor with your leg straight by tightening up the muscles of your buttocks.  Repeat, but this time bend your knee to a comfortable angle, and push your heel against the floor.  You may put a pillow under the heel to make it more comfortable if necessary.   A rehabilitation program following joint replacement surgery can speed recovery and prevent re-injury in the future due to weakened muscles. Contact your doctor or a physical therapist for more information on knee rehabilitation.    CONSTIPATION  Constipation is defined medically as fewer than three stools per week and severe constipation as less than one stool per week.  Even if you have a regular bowel pattern at home, your normal regimen is likely to be disrupted due to multiple reasons following surgery.  Combination of anesthesia, postoperative narcotics, change in appetite and fluid intake all can affect your bowels.   YOU MUST use at least one of the following options; they are listed in order of increasing strength to get the job done.  They are all available over the counter, and you may need to use some, POSSIBLY even all of these options:    Drink plenty of fluids (prune juice may be helpful) and high fiber foods Colace 100  mg by mouth twice a day  Senokot for constipation as directed and as needed Dulcolax (bisacodyl), take with full glass of water  Miralax (polyethylene glycol) once or twice a day as needed.  If you have tried all these things and are unable to have a bowel movement in the first 3-4 days after surgery call either your surgeon or your primary doctor.    If you experience loose stools or diarrhea, hold the medications until you stool forms back up.  If your symptoms do not get better within 1 week or if they get worse, check with your doctor.  If you experience "the worst abdominal pain ever" or develop nausea or vomiting, please contact the office immediately for further recommendations for treatment.   ITCHING:  If you experience itching with your medications, try taking  only a single pain pill, or even half a pain pill at a time.  You can also use Benadryl over the counter for itching or also to help with sleep.   TED HOSE STOCKINGS:  Use stockings on both legs until for at least 2 weeks or as directed by physician office. They may be removed at night for sleeping.  MEDICATIONS:  See your medication summary on the "After Visit Summary" that nursing will review with you.  You may have some home medications which will be placed on hold until you complete the course of blood thinner medication.  It is important for you to complete the blood thinner medication as prescribed.  PRECAUTIONS:  If you experience chest pain or shortness of breath - call 911 immediately for transfer to the hospital emergency department.   If you develop a fever greater that 101 F, purulent drainage from wound, increased redness or drainage from wound, foul odor from the wound/dressing, or calf pain - CONTACT YOUR SURGEON.                                                   FOLLOW-UP APPOINTMENTS:  If you do not already have a post-op appointment, please call the office for an appointment to be seen by your surgeon.  Guidelines  for how soon to be seen are listed in your "After Visit Summary", but are typically between 1-4 weeks after surgery.  OTHER INSTRUCTIONS:   Knee Replacement:  Do not place pillow under knee, focus on keeping the knee straight while resting. CPM instructions: 0-90 degrees, 2 hours in the morning, 2 hours in the afternoon, and 2 hours in the evening. Place foam block, curve side up under heel at all times except when in CPM or when walking.  DO NOT modify, tear, cut, or change the foam block in any way.  MAKE SURE YOU:  . Understand these instructions.  . Get help right away if you are not doing well or get worse.    Thank you for letting us be a part of your medical care team.  It is a privilege we respect greatly.  We hope these instructions will help you stay on track for a fast and full recovery!

## 2018-08-10 ENCOUNTER — Ambulatory Visit: Payer: Managed Care, Other (non HMO) | Admitting: Internal Medicine

## 2018-08-10 ENCOUNTER — Encounter: Payer: Self-pay | Admitting: Internal Medicine

## 2018-08-10 VITALS — BP 124/82 | HR 92 | Temp 98.8°F | Ht 73.0 in | Wt 232.0 lb

## 2018-08-10 DIAGNOSIS — Z0000 Encounter for general adult medical examination without abnormal findings: Secondary | ICD-10-CM

## 2018-08-10 DIAGNOSIS — I10 Essential (primary) hypertension: Secondary | ICD-10-CM | POA: Diagnosis not present

## 2018-08-10 DIAGNOSIS — E785 Hyperlipidemia, unspecified: Secondary | ICD-10-CM

## 2018-08-10 DIAGNOSIS — Z23 Encounter for immunization: Secondary | ICD-10-CM | POA: Diagnosis not present

## 2018-08-10 DIAGNOSIS — R7302 Impaired glucose tolerance (oral): Secondary | ICD-10-CM | POA: Diagnosis not present

## 2018-08-10 DIAGNOSIS — I824Y1 Acute embolism and thrombosis of unspecified deep veins of right proximal lower extremity: Secondary | ICD-10-CM | POA: Diagnosis not present

## 2018-08-10 MED ORDER — DOCUSATE SODIUM 100 MG PO CAPS: 100.0000 mg | ORAL_CAPSULE | Freq: Two times a day (BID) | ORAL | 0 refills | Status: DC

## 2018-08-10 NOTE — Progress Notes (Signed)
Subjective:    Patient ID: [name], male    DOB: 01/11/1951, 67 y.o.   MRN: [id number]  HPI  Here to f/u, now 2 wks post op right knee TKR complicated by RLE DVT now on eliquis, no overt bleeding, Pt denies chest pain, increased sob or doe, wheezing, orthopnea, PND, increased LE swelling, palpitations, dizziness or syncope.  Did have staple out yesterday per surgury and no s/s infection. Pt denies new neurological symptoms such as new headache, or facial or extremity weakness or numbness   Pt denies polydipsia, polyuria.  Losartan has been held postop to now, BP at home have still been < 130/90, and plans to restart if BP trends back up in a few weeks.  No other new complaints.  Trying to minimize his pain med use. Has f/u venous doppler for RLE soon Past Medical History:  Diagnosis Date  . ALLERGIC RHINITIS 12/31/2007  . COUGH, CHRONIC 07/18/2007  . DEGENERATIVE JOINT DISEASE 07/18/2007  . Extrinsic asthma, unspecified 09/08/2009  . GERD 07/18/2007  . HYPERLIPIDEMIA 08/14/2010  . Hypertension   . Impaired glucose tolerance 09/23/2011  . OBESITY 07/18/2007   Past Surgical History:  Procedure Laterality Date  . BACK SURGERY    . DG FEMUR LEFT  ([facility] HX)     rod placed  . TOOTH EXTRACTION    . TOTAL KNEE ARTHROPLASTY Right 07/26/2018   Procedure: RIGHT TOTAL KNEE ARTHROPLASTY;  Surgeon: Kerrin Champagne, MD;  Location: [facility] OR;  Service: Orthopedics;  Laterality: Right;    reports that he has quit smoking. He has never used smokeless tobacco. He reports that he drinks alcohol. He reports that he does not use drugs. family history is not on file. No Known Allergies Current Outpatient Medications on File Prior to Visit  Medication Sig Dispense Refill  . albuterol (PROVENTIL HFA;VENTOLIN HFA) 108 (90 Base) MCG/ACT inhaler Inhale 2 puffs into the lungs every 6 (six) hours as needed for wheezing or shortness of breath. 3 Inhaler 3  . apixaban (ELIQUIS) 5 MG TABS tablet Take 1 tablet (5 mg  total) by mouth 2 (two) times daily. 60 tablet 0  . budesonide-formoterol (SYMBICORT) 160-4.5 MCG/ACT inhaler Inhale 2 puffs into the lungs 2 (two) times daily. (Patient taking differently: Inhale 1 puff into the lungs every evening. ) 3 Inhaler 3  . esomeprazole (NEXIUM) 40 MG capsule Take 1 capsule (40 mg total) by mouth daily before breakfast. (Patient taking differently: Take 40 mg by mouth every three (3) days as needed (heartburn). ) 90 capsule 2  . ferrous gluconate (FERGON) 324 MG tablet Take 1 tablet (324 mg total) by mouth 2 (two) times daily with a meal. 60 tablet 1  . gabapentin (NEURONTIN) 300 MG capsule Take 1 capsule (300 mg total) by mouth 2 (two) times daily. 60 capsule 2  . methocarbamol (ROBAXIN) 500 MG tablet Take 1 tablet (500 mg total) by mouth 4 (four) times daily. (Patient taking differently: Take 500 mg by mouth every 8 (eight) hours as needed for muscle spasms. ) 40 tablet 1  . ondansetron (ZOFRAN) 4 MG tablet Take 1 tablet (4 mg total) by mouth every 6 (six) hours as needed for nausea. 40 tablet 1  . oxyCODONE (OXY IR/ROXICODONE) 5 MG immediate release tablet Take 1-2 tablets (5-10 mg total) by mouth every 4 (four) hours as needed for up to 7 days for moderate pain or breakthrough pain (pain score 4-6). 40 tablet 0  . oxycodone (OXY-IR) 5 MG capsule  Take 1 capsule (5 mg total) by mouth every 4 (four) hours as needed for up to 7 days for pain. 40 capsule 0  . oxyCODONE (OXYCONTIN) 20 mg 12 hr tablet Take 1 tablet (20 mg total) by mouth every 12 (twelve) hours for 14 days. 28 tablet 0  . tadalafil (CIALIS) 20 MG tablet Take 1 tablet (20 mg total) by mouth daily as needed for erectile dysfunction. 10 tablet 11   No current facility-administered medications on file prior to visit.    Review of Systems  Constitutional: Negative for other unusual diaphoresis or sweats HENT: Negative for ear discharge or swelling Eyes: Negative for other worsening visual  disturbances Respiratory: Negative for stridor or other swelling  Gastrointestinal: Negative for worsening distension or other blood Genitourinary: Negative for retention or other urinary change Musculoskeletal: Negative for other MSK pain or swelling Skin: Negative for color change or other new lesions Neurological: Negative for worsening tremors and other numbness  Psychiatric/Behavioral: Negative for worsening agitation or other fatigue All other system neg per pt    Objective:   Physical Exam BP 124/82   Pulse 92   Temp 98.8 F (37.1 C) (Oral)   Ht 6\' 1"  (1.854 m)   Wt 232 lb (105.2 kg)   SpO2 95%   BMI 30.61 kg/m  VS noted, not ill appearing Constitutional: Pt appears in NAD HENT: Head: NCAT.  Right Ear: External ear normal.  Left Ear: External ear normal.  Eyes: . Pupils are equal, round, and reactive to light. Conjunctivae and EOM are normal Nose: without d/c or deformity Neck: Neck supple. Gross normal ROM Cardiovascular: Normal rate and regular rhythm.   Pulmonary/Chest: Effort normal and breath sounds without rales or wheezing.  Abd:  Soft, NT, ND, + BS, no organomegaly Right knee with 1-2+ swelling, RLE 1+ swelling without skin change or erythema Neurological: Pt is alert. At baseline orientation, motor grossly intact Skin: Skin is warm. No rashes, other new lesions, no LE edema Psychiatric: Pt behavior is normal without agitation  No other exam findings Lab Results  Component Value Date   WBC 9.1 07/29/2018   HGB 10.5 (L) 07/29/2018   HCT 33.7 (L) 07/29/2018   PLT 162 07/29/2018   GLUCOSE 142 (H) 07/27/2018   CHOL 172 01/07/2018   TRIG 86.0 01/07/2018   HDL 45.70 01/07/2018   LDLDIRECT 132.9 09/23/2011   LDLCALC 109 (H) 01/07/2018   ALT 27 07/16/2018   AST 27 07/16/2018   NA 137 07/27/2018   K 4.3 07/27/2018   CL 107 07/27/2018   CREATININE 1.24 07/27/2018   BUN 17 07/27/2018   CO2 24 07/27/2018   TSH 0.75 01/07/2018   PSA 0.93 01/07/2018   INR  0.96 07/16/2018   HGBA1C 5.6 01/07/2018       Assessment & Plan:

## 2018-08-10 NOTE — Patient Instructions (Addendum)
You had the flu shot today  Please continue all other medications as before, and refills have been done if requested - the colace to [facility]  Please have the pharmacy call with any other refills you may need.  Please continue your efforts at being more active, low cholesterol diet, and weight control.  Please keep your appointments with your specialists as you may have planned  Please return in 6 months, or sooner if needed, with Lab testing done 3-5 days before

## 2018-08-10 NOTE — Assessment & Plan Note (Signed)
stable overall by history and exam, recent data reviewed with pt, and pt to continue medical treatment as before,  to f/u any worsening symptoms or concerns Lab Results  Component Value Date   LDLCALC 109 (H) 01/07/2018

## 2018-08-10 NOTE — Assessment & Plan Note (Signed)
Plans to f/u repeat venous doppler soon per surgury, cont eliqiuis at least 3 mo

## 2018-08-10 NOTE — Assessment & Plan Note (Signed)
stable overall by history and exam, recent data reviewed with pt, and pt to continue medical treatment as before,  to f/u any worsening symptoms or concerns  

## 2018-08-12 NOTE — Discharge Summary (Addendum)
Patient ID: Joseph FarberRobert M [name] MRN: [id number] DOB/AGE: 12-07-1950 67 y.o.  Admit date: 07/26/2018 Discharge date: 07/29/2018 Admission Diagnoses:  Principal Problem:   S/P TKR (total knee replacement) using cement, right Active Problems:   Impaired glucose tolerance   HTN (hypertension)   DVT (deep venous thrombosis) (HCC)   Anemia due to blood loss, acute   Discharge Diagnoses:  Principal Problem:   S/P TKR (total knee replacement) using cement, right Active Problems:   Impaired glucose tolerance   HTN (hypertension)   DVT (deep venous thrombosis) (HCC)   Anemia due to blood loss, acute  status post Procedure(s): RIGHT TOTAL KNEE ARTHROPLASTY  Past Medical History:  Diagnosis Date  . ALLERGIC RHINITIS 12/31/2007  . COUGH, CHRONIC 07/18/2007  . DEGENERATIVE JOINT DISEASE 07/18/2007  . Extrinsic asthma, unspecified 09/08/2009  . GERD 07/18/2007  . HYPERLIPIDEMIA 08/14/2010  . Hypertension   . Impaired glucose tolerance 09/23/2011  . OBESITY 07/18/2007    Surgeries: Procedure(s): RIGHT TOTAL KNEE ARTHROPLASTY on 07/26/2018   Consultants:   Discharged Condition: Improved  Hospital Course: [name] is an 67 y.o. male who was admitted 07/26/2018 for operative treatment of S/P TKR (total knee replacement) using cement, right. Patient failed conservative treatments (please see the history and physical for the specifics) and had severe unremitting pain that affects sleep, daily activities and work/hobbies. After pre-op clearance, the patient was taken to the operating room on 07/26/2018 and underwent  Procedure(s): RIGHT TOTAL KNEE ARTHROPLASTY.    Patient was given perioperative antibiotics:  Anti-infectives (From admission, onward)   Start     Dose/Rate Route Frequency Ordered Stop   07/26/18 0600  ceFAZolin (ANCEF) IVPB 2g/100 mL premix     2 g 200 mL/hr over 30 Minutes Intravenous To Surgery 07/26/18 0549 07/26/18 0749     This patient recovered in PACU well and [facility]  [facility] for post operative management. He has some complaints of numbness and weakness in the right foot that he had preoperatively. POD#1 the dressing was changed due to saturation of the  AG cell dressing and drainage. A new mepelex was placed and contiued use of ACE wrap. He showed ecchymosis about the posterior lateral knee consistent with soft tissue mobilization necessary to correct varus deformity and resect large perarticular osteophytes. He did have some moderate anemia post op but did not require blood transfusion. He had marked discomfort post op and due to swelling of the knee and right leg a right duplex venous doppler ultrasound of the veins was obtained. This showed small superficial distal vein DVT and he was started on eloquis 10 mg BID. He did well with PT and was able to stand and ambulate well on POD#1 but as the knee local anesthetic and block wore off he had significant pain. He had mild intraarticular swelling post op and mild hematoma. But did not reach the point that drainage was necessary. He was started on ferrous gluconate. Discharged home on POD#3 on eloquis and with follow up of anemia and follow up venous study to be scheduled.   Patient was given sequential compression devices and early ambulation to prevent DVT.   Patient benefited maximally from hospital stay and there were no complications. At the time of discharge, the patient was urinating/moving their bowels without difficulty, tolerating a regular diet, pain is controlled with oral pain medications and they have been cleared by PT/OT.   Recent vital signs: No data found.   Recent laboratory  studies: No results for input(s): WBC, HGB, HCT, PLT, NA, K, CL, CO2, BUN, CREATININE, GLUCOSE, INR, CALCIUM in the last 72 hours.  Invalid input(s): PT, 2   Discharge Medications:   Allergies as of 07/29/2018   No Known Allergies     Medication List    STOP taking these  medications   acetaminophen 500 MG tablet Commonly known as:  TYLENOL   HYDROcodone-Acetaminophen 5-300 MG Tabs   HYDROcodone-acetaminophen 5-325 MG tablet Commonly known as:  NORCO/VICODIN   losartan 50 MG tablet Commonly known as:  COZAAR   mupirocin ointment 2 % Commonly known as:  BACTROBAN   naproxen 500 MG tablet Commonly known as:  NAPROSYN     TAKE these medications   albuterol 108 (90 Base) MCG/ACT inhaler Commonly known as:  PROVENTIL HFA;VENTOLIN HFA Inhale 2 puffs into the lungs every 6 (six) hours as needed for wheezing or shortness of breath.   apixaban 5 MG Tabs tablet Commonly known as:  ELIQUIS Take 1 tablet (5 mg total) by mouth 2 (two) times daily.   budesonide-formoterol 160-4.5 MCG/ACT inhaler Commonly known as:  SYMBICORT Inhale 2 puffs into the lungs 2 (two) times daily. What changed:    how much to take  when to take this   esomeprazole 40 MG capsule Commonly known as:  NEXIUM Take 1 capsule (40 mg total) by mouth daily before breakfast. What changed:    when to take this  reasons to take this   ferrous gluconate 324 MG tablet Commonly known as:  FERGON Take 1 tablet (324 mg total) by mouth 2 (two) times daily with a meal.   gabapentin 300 MG capsule Commonly known as:  NEURONTIN Take 1 capsule (300 mg total) by mouth 2 (two) times daily.   methocarbamol 500 MG tablet Commonly known as:  ROBAXIN Take 1 tablet (500 mg total) by mouth 4 (four) times daily. What changed:    when to take this  reasons to take this   ondansetron 4 MG tablet Commonly known as:  ZOFRAN Take 1 tablet (4 mg total) by mouth every 6 (six) hours as needed for nausea.   oxyCODONE 5 MG immediate release tablet Commonly known as:  Oxy IR/ROXICODONE Take 1-2 tablets (5-10 mg total) by mouth every 4 (four) hours as needed for up to 7 days for moderate pain or breakthrough pain (pain score 4-6).   tadalafil 20 MG tablet Commonly known as:   ADCIRCA/CIALIS Take 1 tablet (20 mg total) by mouth daily as needed for erectile dysfunction.            Discharge Care Instructions  (From admission, onward)         Start     Ordered   07/29/18 0000  Change dressing    Comments:  You may change your dressing on 07/30/2018, then change the dressing daily with sterile 4 x 4 inch gauze dressing and paper tape.  You may clean the incision with alcohol prior to redressing   07/29/18 0840          Diagnostic Studies: Xr Knee 1-2 Views Right  Result Date: 08/09/2018 AP and lateral radiographs of the right knee shows total knee replacement in good position and alignment. Staples in place anterior knee. Soft tissue swelling anterior right knee.    Discharge Instructions    Call MD / Call 911   Complete by:  As directed    If you experience chest pain or shortness of breath, CALL 911 and  be transported to the hospital emergency room.  If you develope a fever above 101 F, pus (white drainage) or increased drainage or redness at the wound, or calf pain, call your surgeon's office.   Change dressing   Complete by:  As directed    You may change your dressing on 07/30/2018, then change the dressing daily with sterile 4 x 4 inch gauze dressing and paper tape.  You may clean the incision with alcohol prior to redressing   Constipation Prevention   Complete by:  As directed    Drink plenty of fluids.  Prune juice may be helpful.  You may use a stool softener, such as Colace (over the counter) 100 mg twice a day.  Use MiraLax (over the counter) for constipation as needed.   Diet - low sodium heart healthy   Complete by:  As directed    Discharge instructions   Complete by:  As directed    Hold losartan until seen by Dr. Fayrene Fearing, wife to check Blood pressure daily and if systolic BP increases to 140 or greater then restart losartan.  Keep knee incision dry for 5 days post op then may wet while bathing. Therapy daily and CPM goal full extension  and greater than 90 degrees flexion. Call if fever or chills or increased drainage. Go to ER if acutely short of breath or call for ambulance. Return for follow up in 2 weeks. May full weight bear on the surgical leg unless told otherwise. Use knee immobilizer until able to straight leg raise off bed with knee stable. In house walking for first 2 weeks. INSTRUCTIONS AFTER JOINT REPLACEMENT   Remove items at home which could result in a fall. This includes throw rugs or furniture in walking pathways ICE to the affected joint every three hours while awake for 30 minutes at a time, for at least the first 3-5 days, and then as needed for pain and swelling.  Continue to use ice for pain and swelling. You may notice swelling that will progress down to the foot and ankle.  This is normal after surgery.  Elevate your leg when you are not up walking on it.   Continue to use the breathing machine you got in the hospital (incentive spirometer) which will help keep your temperature down.  It is common for your temperature to cycle up and down following surgery, especially at night when you are not up moving around and exerting yourself.  The breathing machine keeps your lungs expanded and your temperature down.   DIET:  As you were doing prior to hospitalization, we recommend a well-balanced diet.  DRESSING / WOUND CARE / SHOWERING  You may change your dressing 3-5 days after surgery.  Then change the dressing every day with sterile gauze.  Please use good hand washing techniques before changing the dressing.  Do not use any lotions or creams on the incision until instructed by your surgeon.  ACTIVITY  Increase activity slowly as tolerated, but follow the weight bearing instructions below.   No driving for 6 weeks or until further direction given by your physician.  You cannot drive while taking narcotics.  No lifting or carrying greater than 10 lbs. until further directed by your surgeon. Avoid periods  of inactivity such as sitting longer than an hour when not asleep. This helps prevent blood clots.  You may return to work once you are authorized by your doctor.     WEIGHT BEARING   Weight bearing as  tolerated with assist device (walker, cane, etc) as directed, use it as long as suggested by your surgeon or therapist, typically at least 4-6 weeks.   EXERCISES  Results after joint replacement surgery are often greatly improved when you follow the exercise, range of motion and muscle strengthening exercises prescribed by your doctor. Safety measures are also important to protect the joint from further injury. Any time any of these exercises cause you to have increased pain or swelling, decrease what you are doing until you are comfortable again and then slowly increase them. If you have problems or questions, call your caregiver or physical therapist for advice.   Rehabilitation is important following a joint replacement. After just a few days of immobilization, the muscles of the leg can become weakened and shrink (atrophy).  These exercises are designed to build up the tone and strength of the thigh and leg muscles and to improve motion. Often times heat used for twenty to thirty minutes before working out will loosen up your tissues and help with improving the range of motion but do not use heat for the first two weeks following surgery (sometimes heat can increase post-operative swelling).   These exercises can be done on a training (exercise) mat, on the floor, on a table or on a bed. Use whatever works the best and is most comfortable for you.    Use music or television while you are exercising so that the exercises are a pleasant break in your day. This will make your life better with the exercises acting as a break in your routine that you can look forward to.   Perform all exercises about fifteen times, three times per day or as directed.  You should exercise both the operative leg and the  other leg as well.  Exercises include:   Quad Sets - Tighten up the muscle on the front of the thigh (Quad) and hold for 5-10 seconds.   Straight Leg Raises - With your knee straight (if you were given a brace, keep it on), lift the leg to 60 degrees, hold for 3 seconds, and slowly lower the leg.  Perform this exercise against resistance later as your leg gets stronger.  Leg Slides: Lying on your back, slowly slide your foot toward your buttocks, bending your knee up off the floor (only go as far as is comfortable). Then slowly slide your foot back down until your leg is flat on the floor again.  Angel Wings: Lying on your back spread your legs to the side as far apart as you can without causing discomfort.  Hamstring Strength:  Lying on your back, push your heel against the floor with your leg straight by tightening up the muscles of your buttocks.  Repeat, but this time bend your knee to a comfortable angle, and push your heel against the floor.  You may put a pillow under the heel to make it more comfortable if necessary.   A rehabilitation program following joint replacement surgery can speed recovery and prevent re-injury in the future due to weakened muscles. Contact your doctor or a physical therapist for more information on knee rehabilitation.    CONSTIPATION  Constipation is defined medically as fewer than three stools per week and severe constipation as less than one stool per week.  Even if you have a regular bowel pattern at home, your normal regimen is likely to be disrupted due to multiple reasons following surgery.  Combination of anesthesia, postoperative narcotics, change in appetite  and fluid intake all can affect your bowels.   YOU MUST use at least one of the following options; they are listed in order of increasing strength to get the job done.  They are all available over the counter, and you may need to use some, POSSIBLY even all of these options:    Drink plenty of fluids  (prune juice may be helpful) and high fiber foods Colace 100 mg by mouth twice a day  Senokot for constipation as directed and as needed Dulcolax (bisacodyl), take with full glass of water  Miralax (polyethylene glycol) once or twice a day as needed.  If you have tried all these things and are unable to have a bowel movement in the first 3-4 days after surgery call either your surgeon or your primary doctor.    If you experience loose stools or diarrhea, hold the medications until you stool forms back up.  If your symptoms do not get better within 1 week or if they get worse, check with your doctor.  If you experience "the worst abdominal pain ever" or develop nausea or vomiting, please contact the office immediately for further recommendations for treatment.   ITCHING:  If you experience itching with your medications, try taking only a single pain pill, or even half a pain pill at a time.  You can also use Benadryl over the counter for itching or also to help with sleep.   TED HOSE STOCKINGS:  Use stockings on both legs until for at least 2 weeks or as directed by physician office. They may be removed at night for sleeping.  MEDICATIONS:  See your medication summary on the "After Visit Summary" that nursing will review with you.  You may have some home medications which will be placed on hold until you complete the course of blood thinner medication.  It is important for you to complete the blood thinner medication as prescribed.  PRECAUTIONS:  If you experience chest pain or shortness of breath - call 911 immediately for transfer to the hospital emergency department.   If you develop a fever greater that 101 F, purulent drainage from wound, increased redness or drainage from wound, foul odor from the wound/dressing, or calf pain - CONTACT YOUR SURGEON.                                                   FOLLOW-UP APPOINTMENTS:  If you do not already have a post-op appointment, please call the  office for an appointment to be seen by your surgeon.  Guidelines for how soon to be seen are listed in your "After Visit Summary", but are typically between 1-4 weeks after surgery.  OTHER INSTRUCTIONS:   Knee Replacement:  Do not place pillow under knee, focus on keeping the knee straight while resting. CPM instructions: 0-90 degrees, 2 hours in the morning, 2 hours in the afternoon, and 2 hours in the evening. Place foam block, curve side up under heel at all times except when in CPM or when walking.  DO NOT modify, tear, cut, or change the foam block in any way.  MAKE SURE YOU:  Understand these instructions.  Get help right away if you are not doing well or get worse.    Thank you for letting us be a part of your medical care team.  It is  a privilege we respect greatly.  We hope these instructions will help you stay on track for a fast and full recovery!   Driving restrictions   Complete by:  As directed    No driving for 3 weeks   Increase activity slowly as tolerated   Complete by:  As directed    Lifting restrictions   Complete by:  As directed    No lifting for 8 weeks      Follow-up Information    Kerrin Champagne, MD In 2 weeks.   Specialty:  Orthopedic Surgery Why:  For wound re-check Contact information: [address] [place] [address] [phone]        Corwin Levins, MD In 2 weeks.   Specialties:  Internal Medicine, Radiology Why:  Need follow up of eliquis start for evaluation, DVT right leg will need follow up ultrasound in 2 weeks.  Contact information: [address] [facility] [place] [address] [phone]           Discharge Plan:  discharge to home  Disposition:     Signed: Zonia Kief  08/12/2018, 11:33 AM

## 2018-08-19 ENCOUNTER — Telehealth (INDEPENDENT_AMBULATORY_CARE_PROVIDER_SITE_OTHER): Payer: Self-pay | Admitting: Radiology

## 2018-08-19 DIAGNOSIS — Z96651 Presence of right artificial knee joint: Secondary | ICD-10-CM

## 2018-08-19 NOTE — Telephone Encounter (Signed)
IC patient and advised order is entered and he will call to schedule appt.

## 2018-08-19 NOTE — Telephone Encounter (Signed)
Patient's last HHPT is today, he wants to be referred to OP PT now, do you have a specific location you want him to go to?

## 2018-08-19 NOTE — Addendum Note (Signed)
Addended by: Cherre HugerMAY, Derk Doubek E on: 08/19/2018 11:24 AM   Modules accepted: Orders

## 2018-08-19 NOTE — Telephone Encounter (Signed)
He can go to 1 of the [facility] facilities.  Knee range of motion and strengthening protocol.  Thanks

## 2018-08-23 ENCOUNTER — Telehealth (INDEPENDENT_AMBULATORY_CARE_PROVIDER_SITE_OTHER): Payer: Self-pay | Admitting: Radiology

## 2018-08-23 ENCOUNTER — Encounter (INDEPENDENT_AMBULATORY_CARE_PROVIDER_SITE_OTHER): Payer: Self-pay | Admitting: Specialist

## 2018-08-23 ENCOUNTER — Ambulatory Visit (HOSPITAL_COMMUNITY)
Admission: RE | Admit: 2018-08-23 | Discharge: 2018-08-23 | Disposition: A | Discharge status: Home / Self Care | Payer: Managed Care, Other (non HMO) | Source: Ambulatory Visit | Attending: Surgery | Admitting: Surgery

## 2018-08-23 ENCOUNTER — Ambulatory Visit (INDEPENDENT_AMBULATORY_CARE_PROVIDER_SITE_OTHER): Payer: Managed Care, Other (non HMO) | Admitting: Specialist

## 2018-08-23 VITALS — BP 95/70 | HR 105 | Temp 99.1°F | Ht 72.0 in | Wt 230.0 lb

## 2018-08-23 DIAGNOSIS — Z09 Encounter for follow-up examination after completed treatment for conditions other than malignant neoplasm: Secondary | ICD-10-CM | POA: Insufficient documentation

## 2018-08-23 DIAGNOSIS — Z96651 Presence of right artificial knee joint: Secondary | ICD-10-CM | POA: Diagnosis not present

## 2018-08-23 DIAGNOSIS — Z86718 Personal history of other venous thrombosis and embolism: Secondary | ICD-10-CM | POA: Insufficient documentation

## 2018-08-23 DIAGNOSIS — I824Z1 Acute embolism and thrombosis of unspecified deep veins of right distal lower extremity: Secondary | ICD-10-CM

## 2018-08-23 DIAGNOSIS — M4722 Other spondylosis with radiculopathy, cervical region: Secondary | ICD-10-CM

## 2018-08-23 MED ORDER — METHOCARBAMOL 500 MG PO TABS: 500.0000 mg | ORAL_TABLET | Freq: Four times a day (QID) | ORAL | 1 refills | Status: DC | PRN

## 2018-08-23 MED ORDER — OXYCODONE HCL ER 10 MG PO T12A: 20.0000 mg | EXTENDED_RELEASE_TABLET | Freq: Two times a day (BID) | ORAL | 0 refills | Status: DC

## 2018-08-23 MED ORDER — OXYCODONE HCL 5 MG PO CAPS: 5.0000 mg | ORAL_CAPSULE | ORAL | 0 refills | Status: DC | PRN

## 2018-08-23 MED ORDER — ASPIRIN 81 MG PO CHEW: 81.0000 mg | CHEWABLE_TABLET | Freq: Two times a day (BID) | ORAL | 1 refills | Status: DC

## 2018-08-23 NOTE — Patient Instructions (Signed)
Plan: Pt to work on both extension and flexion. Your flexion is excellent. There is an extension lag but motion is good and You want to be careful about over strengthening. Ice after therapy and heat to the front thigh muscle prior to PT.  Stop eloquis, start Baby ASA BID. Avoid overhead lifting and overhead use of the arms. Do not lift greater than 10 lbs. Adjust head rest in vehicle to prevent hyperextension if rear ended. Take extra precautions to avoid falling. Avoid bending, stooping and avoid lifting weights greater than 10 lbs. Avoid prolong standing and walking. Avoid frequent bending and stooping  No lifting greater than 10 lbs. May use ice or moist heat for pain. Weight loss is of benefit. Handicap license is approved.

## 2018-08-23 NOTE — Progress Notes (Signed)
Post-Op Visit Note   Patient: Joseph FarberRobert M [name]           Date of Birth: [birth year]           MRN: [id number] Visit Date: 08/23/2018 PCP: Corwin LevinsJohn, [name], MD   Assessment & Plan:3 1/2 weeks post right total knee replacement.  Chief Complaint:  Chief Complaint  Patient presents with  . Right Knee - Routine Post Op  . Lower Back - Follow-up  . Neck - Follow-up  Right knee ROM 7-115 degrees. He has nausea with the oxycontin. The right knee ROM with pain.  The right knee with moderate swelling, positive  He has pain in his left neck and transverse lumbar. He is using a walker.   Visit Diagnoses: No diagnosis found.  Plan: Pt to work on both extension and flexion. Your flexion is excellent. There is an extension lag but motion is good and You want to be careful about over strengthening. Ice after therapy and heat to the front thigh muscle prior to PT.  Stop eloquis, start Baby ASA BID. Avoid overhead lifting and overhead use of the arms. Do not lift greater than 10 lbs. Adjust head rest in vehicle to prevent hyperextension if rear ended. Take extra precautions to avoid falling. Avoid bending, stooping and avoid lifting weights greater than 10 lbs. Avoid prolong standing and walking. Avoid frequent bending and stooping  No lifting greater than 10 lbs. May use ice or moist heat for pain. Weight loss is of benefit. Handicap license is approved.  Follow-Up Instructions: No follow-ups on file.   Orders:  No orders of the defined types were placed in this encounter.  No orders of the defined types were placed in this encounter.   Imaging: No results found.  PMFS History: Patient Active Problem List   Diagnosis Date Noted  . Unilateral primary osteoarthritis, right knee 07/26/2018    Priority: High    Class: End Stage  . Anemia due to blood loss, acute 07/29/2018    Priority: Medium    Class: Acute  . DVT (deep venous thrombosis) (HCC) 07/28/2018  . S/P TKR (total knee  replacement) using cement, right 07/26/2018  . Flu-like symptoms 01/23/2018  . Dyspnea 01/07/2018  . HTN (hypertension) 10/30/2016  . Cough 10/30/2016  . Asthma with exacerbation 10/30/2016  . Edema 11/22/2015  . Erectile dysfunction 01/17/2015  . Corneal abrasion, right, sequela 06/27/2014  . Encounter for well adult exam with abnormal findings 09/23/2011  . Impaired glucose tolerance 09/23/2011  . Hyperlipidemia 08/14/2010  . Extrinsic asthma 09/08/2009  . ALLERGIC RHINITIS 12/31/2007  . OBESITY 07/18/2007  . GERD 07/18/2007  . DEGENERATIVE JOINT DISEASE 07/18/2007   Past Medical History:  Diagnosis Date  . ALLERGIC RHINITIS 12/31/2007  . COUGH, CHRONIC 07/18/2007  . DEGENERATIVE JOINT DISEASE 07/18/2007  . Extrinsic asthma, unspecified 09/08/2009  . GERD 07/18/2007  . HYPERLIPIDEMIA 08/14/2010  . Hypertension   . Impaired glucose tolerance 09/23/2011  . OBESITY 07/18/2007    No family history on file.  Past Surgical History:  Procedure Laterality Date  . BACK SURGERY    . DG FEMUR LEFT  ([facility] HX)     rod placed  . TOOTH EXTRACTION    . TOTAL KNEE ARTHROPLASTY Right 07/26/2018   Procedure: RIGHT TOTAL KNEE ARTHROPLASTY;  Surgeon: Kerrin ChampagneNitka, James E, MD;  Location: [facility] OR;  Service: Orthopedics;  Laterality: Right;   Social History   Occupational History  . 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  Smoking status: Former Games developer  . Smokeless tobacco: Never Used  . Tobacco comment: Marijuana 30 years  Substance and Sexual Activity  . Alcohol use: Yes    Comment: occ  . Drug use: Never  . Sexual activity: Not on file

## 2018-08-23 NOTE — Telephone Encounter (Signed)
[facility] from [facility] called and states that patient DVT that was in Right  Soleal Vein has resolved at this time.  I have notified Dr. Otelia SergeantNitka of this.

## 2018-08-24 ENCOUNTER — Ambulatory Visit: Payer: Managed Care, Other (non HMO) | Admitting: Physical Therapy

## 2018-08-25 ENCOUNTER — Telehealth (INDEPENDENT_AMBULATORY_CARE_PROVIDER_SITE_OTHER): Payer: Self-pay | Admitting: Specialist

## 2018-08-25 ENCOUNTER — Other Ambulatory Visit (INDEPENDENT_AMBULATORY_CARE_PROVIDER_SITE_OTHER): Payer: Self-pay | Admitting: Radiology

## 2018-08-25 NOTE — Telephone Encounter (Signed)
Patient did not stop to check out when he left on Monday, he is needing a 3 week follow up which would be 09/13/18. Can you add patient to cancellation list or call with appt? Patients # [phone]

## 2018-08-25 NOTE — Telephone Encounter (Signed)
Sent request to Dr. Nitka 

## 2018-08-25 NOTE — Telephone Encounter (Signed)
I called and put in on 09/13/18 @ 2pm

## 2018-08-25 NOTE — Telephone Encounter (Signed)
Patient is [facility] [address]

## 2018-08-26 MED ORDER — ONDANSETRON HCL 4 MG PO TABS: 4.0000 mg | ORAL_TABLET | Freq: Four times a day (QID) | ORAL | 1 refills | Status: DC | PRN

## 2018-08-27 ENCOUNTER — Ambulatory Visit: Payer: Managed Care, Other (non HMO) | Attending: Specialist | Admitting: Physical Therapy

## 2018-08-27 ENCOUNTER — Encounter: Payer: Self-pay | Admitting: Physical Therapy

## 2018-08-27 DIAGNOSIS — M25561 Pain in right knee: Secondary | ICD-10-CM

## 2018-08-27 DIAGNOSIS — R262 Difficulty in walking, not elsewhere classified: Secondary | ICD-10-CM

## 2018-08-27 DIAGNOSIS — M1711 Unilateral primary osteoarthritis, right knee: Secondary | ICD-10-CM

## 2018-08-27 DIAGNOSIS — R600 Localized edema: Secondary | ICD-10-CM | POA: Diagnosis present

## 2018-08-27 NOTE — Telephone Encounter (Signed)
I called zofran in to pharmacy and I called and advised pt this was done

## 2018-08-27 NOTE — Therapy (Signed)
[facility]patient [facility] [address] [place], [place], [address] Phone: [phone]   Fax:  [phone]  Physical Therapy Evaluation  Patient Details  Name: MRK BUZBY MRN: [id number] Date of Birth: [birth year] Referring Provider (PT): Otelia Sergeant   Encounter Date: 08/27/2018  PT End of Session - 08/27/18 0904    Visit Number  1    Number of Visits  12    Date for PT Re-Evaluation  10/08/18    Authorization Type  [employer]    PT Start Time  0800    PT Stop Time  0840    PT Time Calculation (min)  40 min    Activity Tolerance  Patient tolerated treatment well    Behavior During Therapy  [facility] for tasks assessed/performed       Past Medical History:  Diagnosis Date  . ALLERGIC RHINITIS 12/31/2007  . COUGH, CHRONIC 07/18/2007  . DEGENERATIVE JOINT DISEASE 07/18/2007  . Extrinsic asthma, unspecified 09/08/2009  . GERD 07/18/2007  . HYPERLIPIDEMIA 08/14/2010  . Hypertension   . Impaired glucose tolerance 09/23/2011  . OBESITY 07/18/2007    Past Surgical History:  Procedure Laterality Date  . BACK SURGERY    . DG FEMUR LEFT  ([facility] HX)     rod placed  . TOOTH EXTRACTION    . TOTAL KNEE ARTHROPLASTY Right 07/26/2018   Procedure: RIGHT TOTAL KNEE ARTHROPLASTY;  Surgeon: Kerrin Champagne, MD;  Location: [facility] OR;  Service: Orthopedics;  Laterality: Right;    There were no vitals filed for this visit.   Subjective Assessment - 08/27/18 0840    Subjective  Pt relays he had Rt knee surgery in 1970s, then was bone on bone and limped for 20 years. He had Rt TKA on 07/26/18. He also relays N/V from pain medicine    Limitations  Standing;Walking    How long can you stand comfortably?  5 min    How long can you walk comfortably?  household distance with RW    Patient Stated Goals  improve knee ext ROM, get stregth and endurance back    Currently in Pain?  Yes    Pain Score  5     Pain Orientation  Right    Pain Descriptors / Indicators  Aching;Tightness     Pain Onset  More than a month ago    Pain Frequency  Intermittent    Aggravating Factors   knee ext, standing, walking    Pain Relieving Factors  elevating leg, ice    Multiple Pain Sites  No         [facility] PT Assessment - 08/27/18 0001      Assessment   Medical Diagnosis  Rt TKA 07/26/18    Referring Provider (PT)  Otelia Sergeant    Onset Date/Surgical Date  07/26/18    Next MD Visit  ?    Prior Therapy  HHPT      Balance Screen   Has the patient fallen in the past 6 months  No      Home Environment   Living Environment  Private residence      Prior Function   Level of Independence  Independent    Vocation  Full time employment    Vocation Requirements  truck driver, out on FMLA  currently      Cognition   Overall Cognitive Status  Within Functional Limits for tasks assessed      Observation/Other Assessments   Focus on Therapeutic Outcomes (FOTO)  72% limited      Observation/Other Assessments-Edema    Edema  Circumferential      Circumferential Edema   Circumferential - Right  16 in    Circumferential - Left   14.5 in      Sensation   Light Touch  Appears Intact      ROM / Strength   AROM / PROM / Strength  AROM;Strength      AROM   AROM Assessment Site  Knee    Right/Left Knee  Right    Right Knee Extension  -8    Right Knee Flexion  117      Strength   Overall Strength Comments  4+/5 Rt hip/knee grossly      Flexibility   Soft Tissue Assessment /Muscle Length  --   tight H.S. Rt     Palpation   Patella mobility  normal    Palpation comment  TTP ant knee      Ambulation/Gait   Gait Comments  using RW currently, tried using cane but was limping and this bothered his back                Objective measurements completed on examination: See above findings.      [facility] Adult PT Treatment/Exercise - 08/27/18 0001      Exercises   Exercises  Knee/Hip      Knee/Hip Exercises: Stretches   Passive Hamstring Stretch  2 reps;Right;30 seconds       Knee/Hip Exercises: Supine   Quad Sets  Right;10 reps    Quad Sets Limitations  5 sec hold    Heel Slides  AAROM;5 reps    Heel Prop for Knee Extension  1 minute      Modalities   Modalities  Cryotherapy      Cryotherapy   Number Minutes Cryotherapy  10 Minutes    Cryotherapy Location  Knee    Type of Cryotherapy  Ice pack      Manual Therapy   Manual therapy comments  PROM, ext mobs             PT Education - 08/27/18 0904    Education Details  HEP, POC, Ice    Person(s) Educated  Patient    Methods  Explanation;Demonstration;Verbal cues;Handout    Comprehension  Verbalized understanding;Need further instruction;Returned demonstration          PT Long Term Goals - 08/27/18 0912      PT LONG TERM GOAL #1   Title  Pt will be I and compliant with HEP. 6 weeks 10/08/18    Status  New      PT LONG TERM GOAL #2   Title  Pt will improve Rt knee ROM -3 to 120 to improve function. 6 weeks 10/08/18    Status  New      PT LONG TERM GOAL #3   Title  Pt will improve Rt hip/knee strength to 5/5 to improve funciton. 6 weeks 10/08/18    Status  New      PT LONG TERM GOAL #4   Title  Pt will be able to ambulate community distances without AD and functional gait. 6 weeks 10/08/18    Status  New      PT LONG TERM GOAL #5   Title  Pt will improve FOTO to less than 52% limited. 6 weeks 10/08/18    Status  New             Plan -  08/27/18 0908    Clinical Impression Statement  Pt presents with Rt knee pain, stiffness, and weakness S/P Rt TKA on 07/26/18. He is doing well with flexion ROM, but has difficulty with ext ROM as well as with ambulaiton and standing tolerance. He will beneifit from skilled PT to address his deficits.    Clinical Presentation  Stable    Clinical Decision Making  Low    Rehab Potential  Good    Clinical Impairments Affecting Rehab Potential  sigficiant knee valgus and limped 20 years before TKA     PT Frequency  2x / week    PT Duration  6  weeks    PT Treatment/Interventions  Cryotherapy;Electrical Stimulation;Iontophoresis 4mg /ml Dexamethasone;Moist Heat;Ultrasound;Therapeutic activities;Therapeutic exercise;Neuromuscular re-education;Manual techniques;Passive range of motion;Dry needling;Taping;Vasopneumatic Device;Joint Manipulations    PT Next Visit Plan  review HEP, ext ROM, knee/hip strength, gait    PT Home Exercise Plan  H.S stretch, quad sets, mini squats, heel slides, ext prop    Consulted and Agree with Plan of Care  Patient       Patient will benefit from skilled therapeutic intervention in order to improve the following deficits and impairments:  Abnormal gait, Decreased activity tolerance, Decreased endurance, Decreased range of motion, Decreased strength, Hypomobility, Difficulty walking, Pain  Visit Diagnosis: Acute pain of right knee  Primary osteoarthritis of right knee  Localized edema  Difficulty in walking, not elsewhere classified     Problem List Patient Active Problem List   Diagnosis Date Noted  . Anemia due to blood loss, acute 07/29/2018    Class: Acute  . DVT (deep venous thrombosis) (HCC) 07/28/2018  . Unilateral primary osteoarthritis, right knee 07/26/2018    Class: End Stage  . S/P TKR (total knee replacement) using cement, right 07/26/2018  . Flu-like symptoms 01/23/2018  . Dyspnea 01/07/2018  . HTN (hypertension) 10/30/2016  . Cough 10/30/2016  . Asthma with exacerbation 10/30/2016  . Edema 11/22/2015  . Erectile dysfunction 01/17/2015  . Corneal abrasion, right, sequela 06/27/2014  . Encounter for well adult exam with abnormal findings 09/23/2011  . Impaired glucose tolerance 09/23/2011  . Hyperlipidemia 08/14/2010  . Extrinsic asthma 09/08/2009  . ALLERGIC RHINITIS 12/31/2007  . OBESITY 07/18/2007  . GERD 07/18/2007  . DEGENERATIVE JOINT DISEASE 07/18/2007    April Manson, PT, DPT 08/27/2018, 9:15 AM  [facility] [address] [place], [place], [address] Phone: [phone]   Fax:  [phone]  Name: EDKER PUNT MRN: [id number] Date of Birth: September 10, 1951

## 2018-09-02 ENCOUNTER — Ambulatory Visit: Payer: Managed Care, Other (non HMO) | Attending: Specialist | Admitting: Physical Therapy

## 2018-09-02 ENCOUNTER — Encounter: Payer: Self-pay | Admitting: Physical Therapy

## 2018-09-02 DIAGNOSIS — R262 Difficulty in walking, not elsewhere classified: Secondary | ICD-10-CM | POA: Insufficient documentation

## 2018-09-02 DIAGNOSIS — R600 Localized edema: Secondary | ICD-10-CM | POA: Diagnosis present

## 2018-09-02 DIAGNOSIS — M1711 Unilateral primary osteoarthritis, right knee: Secondary | ICD-10-CM | POA: Diagnosis present

## 2018-09-02 DIAGNOSIS — M62830 Muscle spasm of back: Secondary | ICD-10-CM | POA: Insufficient documentation

## 2018-09-02 DIAGNOSIS — M545 Low back pain: Secondary | ICD-10-CM | POA: Insufficient documentation

## 2018-09-02 DIAGNOSIS — M25561 Pain in right knee: Secondary | ICD-10-CM | POA: Diagnosis not present

## 2018-09-02 DIAGNOSIS — G8929 Other chronic pain: Secondary | ICD-10-CM | POA: Diagnosis present

## 2018-09-02 NOTE — Therapy (Signed)
[facility] Outpatient Rehabilitation [facility] [address] [place], [place], [address] Phone: [phone]   Fax:  [phone]  Physical Therapy Treatment  Patient Details  Name: [name] MRN: [id number] Date of Birth: Aug 27, 1951 Referring Provider (PT): Otelia Sergeant   Encounter Date: 09/02/2018  PT End of Session - 09/02/18 1629    Visit Number  2    Number of Visits  12    Date for PT Re-Evaluation  10/08/18    Authorization Type  [employer]    PT Start Time  1630    PT Stop Time  1721    PT Time Calculation (min)  51 min    Activity Tolerance  Patient tolerated treatment well    Behavior During Therapy  [facility] for tasks assessed/performed       Past Medical History:  Diagnosis Date  . ALLERGIC RHINITIS 12/31/2007  . COUGH, CHRONIC 07/18/2007  . DEGENERATIVE JOINT DISEASE 07/18/2007  . Extrinsic asthma, unspecified 09/08/2009  . GERD 07/18/2007  . HYPERLIPIDEMIA 08/14/2010  . Hypertension   . Impaired glucose tolerance 09/23/2011  . OBESITY 07/18/2007    Past Surgical History:  Procedure Laterality Date  . BACK SURGERY    . DG FEMUR LEFT  ([facility] HX)     rod placed  . TOOTH EXTRACTION    . TOTAL KNEE ARTHROPLASTY Right 07/26/2018   Procedure: RIGHT TOTAL KNEE ARTHROPLASTY;  Surgeon: Kerrin Champagne, MD;  Location: [facility] OR;  Service: Orthopedics;  Laterality: Right;    There were no vitals filed for this visit.  Subjective Assessment - 09/02/18 1630    Subjective  I cannot get it straight. I have been sick so I have not been able to do much.     Patient Stated Goals  improve knee ext ROM, get stregth and endurance back    Currently in Pain?  No/denies                       [facility] Adult PT Treatment/Exercise - 09/02/18 0001      Exercises   Exercises  Knee/Hip      Knee/Hip Exercises: Stretches   Passive Hamstring Stretch  Both;30 seconds      Knee/Hip Exercises: Aerobic   Stationary Bike  5 min L2      Knee/Hip Exercises: Standing   Heel Raises  20 reps;Both    Heel Raises Limitations  walker for balance      Knee/Hip Exercises: Seated   Long Arc Quad  Right;2 sets;10 reps    Long Arc Quad Weight  3 lbs.    Long Arc [employer] Limitations  ball bw knees    Sit to [employer]  10 reps      Knee/Hip Exercises: Supine   Straight Leg Raises  10 reps      Modalities   Modalities  Vasopneumatic      Vasopneumatic   Number Minutes Vasopneumatic   15 minutes    Vasopnuematic Location   Knee    Vasopneumatic Pressure  Low    Vasopneumatic Temperature   34             PT Education - 09/02/18 1709    Education Details  walker height, gait with walker and safety vs SPC, RICE, exercise form/rationale    Person(s) Educated  Patient    Methods  Explanation;Demonstration;Tactile cues;Verbal cues    Comprehension  Verbalized understanding;Returned demonstration;Verbal cues required;Tactile cues required;Need further instruction  PT Long Term Goals - 08/27/18 0912      PT LONG TERM GOAL #1   Title  Pt will be I and compliant with HEP. 6 weeks 10/08/18    Status  New      PT LONG TERM GOAL #2   Title  Pt will improve Rt knee ROM -3 to 120 to improve function. 6 weeks 10/08/18    Status  New      PT LONG TERM GOAL #3   Title  Pt will improve Rt hip/knee strength to 5/5 to improve funciton. 6 weeks 10/08/18    Status  New      PT LONG TERM GOAL #4   Title  Pt will be able to ambulate community distances without AD and functional gait. 6 weeks 10/08/18    Status  New      PT LONG TERM GOAL #5   Title  Pt will improve FOTO to less than 52% limited. 6 weeks 10/08/18    Status  New            Plan - 09/02/18 1706    Clinical Impression Statement  pt with low energy due to feeling ill but was able to tolerate exercises and demo good form today. Edema noted and vaso placed after exercises. buckling in knee when ambulating. increased height of RW for posture and discussed safety benefits of walker vs SPC    PT  Treatment/Interventions  Cryotherapy;Electrical Stimulation;Iontophoresis 4mg /ml Dexamethasone;Moist Heat;Ultrasound;Therapeutic activities;Therapeutic exercise;Neuromuscular re-education;Manual techniques;Passive range of motion;Dry needling;Taping;Vasopneumatic Device;Joint Manipulations    PT Next Visit Plan  gross LE strength    PT Home Exercise Plan  H.S stretch, quad sets, mini squats, heel slides, ext prop    Consulted and Agree with Plan of Care  Patient       Patient will benefit from skilled therapeutic intervention in order to improve the following deficits and impairments:  Abnormal gait, Decreased activity tolerance, Decreased endurance, Decreased range of motion, Decreased strength, Hypomobility, Difficulty walking, Pain  Visit Diagnosis: Acute pain of right knee  Primary osteoarthritis of right knee  Localized edema  Difficulty in walking, not elsewhere classified     Problem List Patient Active Problem List   Diagnosis Date Noted  . Anemia due to blood loss, acute 07/29/2018    Class: Acute  . DVT (deep venous thrombosis) (HCC) 07/28/2018  . Unilateral primary osteoarthritis, right knee 07/26/2018    Class: End Stage  . S/P TKR (total knee replacement) using cement, right 07/26/2018  . Flu-like symptoms 01/23/2018  . Dyspnea 01/07/2018  . HTN (hypertension) 10/30/2016  . Cough 10/30/2016  . Asthma with exacerbation 10/30/2016  . Edema 11/22/2015  . Erectile dysfunction 01/17/2015  . Corneal abrasion, right, sequela 06/27/2014  . Encounter for well adult exam with abnormal findings 09/23/2011  . Impaired glucose tolerance 09/23/2011  . Hyperlipidemia 08/14/2010  . Extrinsic asthma 09/08/2009  . ALLERGIC RHINITIS 12/31/2007  . OBESITY 07/18/2007  . GERD 07/18/2007  . DEGENERATIVE JOINT DISEASE 07/18/2007    Treveon Bourcier C. Kellene Mccleary PT, DPT 09/02/18 5:09 PM   [facility] [address] [place], [place],  [address] Phone: [phone]   Fax:  [phone]  Name: [name] MRN: [id number] Date of Birth: 09-13-1951

## 2018-09-07 ENCOUNTER — Ambulatory Visit (INDEPENDENT_AMBULATORY_CARE_PROVIDER_SITE_OTHER): Payer: Managed Care, Other (non HMO) | Admitting: Family Medicine

## 2018-09-07 ENCOUNTER — Encounter (INDEPENDENT_AMBULATORY_CARE_PROVIDER_SITE_OTHER): Payer: Self-pay | Admitting: Family Medicine

## 2018-09-07 DIAGNOSIS — M545 Low back pain, unspecified: Secondary | ICD-10-CM

## 2018-09-07 MED ORDER — VITAMIN D-3 125 MCG (5000 UT) PO TABS: 1.0000 | ORAL_TABLET | Freq: Every day | ORAL | 3 refills | Status: DC

## 2018-09-07 MED ORDER — MAGNESIUM 400 MG PO TABS: 1.0000 | ORAL_TABLET | Freq: Every day | ORAL | 1 refills | Status: DC

## 2018-09-07 MED ORDER — BACLOFEN 10 MG PO TABS: 10.0000 mg | ORAL_TABLET | Freq: Three times a day (TID) | ORAL | 1 refills | Status: DC | PRN

## 2018-09-07 NOTE — Progress Notes (Signed)
Office Visit Note   Patient: [name]           Date of Birth: 27-Dec-1950           MRN: [id number] Visit Date: 09/07/2018 Requested by: Corwin Levins, MD [address] [place], [place] [address] PCP: Corwin Levins, MD  Subjective: Chief Complaint  Patient presents with  . Lower Back - Pain    "locked up" since 09/04/18, 2 days after physical therapy for the right knee (post total knee replacement)  . Neck - Pain    "locked up" since 09/04/18    HPI: He is here with right sided low back pain.  He was having some pain at his last visit, but then it seemed to get better.  His past week it flared up again.  Severe pain, hurts to turn his head, but most of his pain is in the lower back with occasional radiation into the right leg.  He is recovering from his knee surgery.  For many years he has walked with a limp.  Now he is trying to walk without limping.  He was doing physical therapy but that is on hold now that he is having this pain.  He is using oxycodone again for pain and also Robaxin.                ROS: Contributory  Objective: Vital Signs: There were no vitals taken for this visit.  Physical Exam:  Back: Very tender and tight paraspinous muscles to the right of midline at the L4-5/L5-S1 levels.  No pain at the SI joint or in the sciatic notch.  Straight leg raise is negative, lower extremity strength and reflexes are still normal.  Imaging: None today.  Assessment & Plan: 1.  Right-sided low back pain with muscle spasm, suspect myofascial pain due to altered gait -Switch to baclofen as needed.  Start physical therapy for myofascial release techniques.  Follow-up with Dr. Otelia Sergeant as scheduled.   Follow-Up Instructions: No follow-ups on file.       Procedures: None today.   PMFS History: Patient Active Problem List   Diagnosis Date Noted  . Anemia due to blood loss, acute 07/29/2018    Class: Acute  . DVT (deep venous thrombosis) (HCC) 07/28/2018    . Unilateral primary osteoarthritis, right knee 07/26/2018    Class: End Stage  . S/P TKR (total knee replacement) using cement, right 07/26/2018  . Flu-like symptoms 01/23/2018  . Dyspnea 01/07/2018  . HTN (hypertension) 10/30/2016  . Cough 10/30/2016  . Asthma with exacerbation 10/30/2016  . Edema 11/22/2015  . Erectile dysfunction 01/17/2015  . Corneal abrasion, right, sequela 06/27/2014  . Encounter for well adult exam with abnormal findings 09/23/2011  . Impaired glucose tolerance 09/23/2011  . Hyperlipidemia 08/14/2010  . Extrinsic asthma 09/08/2009  . ALLERGIC RHINITIS 12/31/2007  . OBESITY 07/18/2007  . GERD 07/18/2007  . DEGENERATIVE JOINT DISEASE 07/18/2007   Past Medical History:  Diagnosis Date  . ALLERGIC RHINITIS 12/31/2007  . COUGH, CHRONIC 07/18/2007  . DEGENERATIVE JOINT DISEASE 07/18/2007  . Extrinsic asthma, unspecified 09/08/2009  . GERD 07/18/2007  . HYPERLIPIDEMIA 08/14/2010  . Hypertension   . Impaired glucose tolerance 09/23/2011  . OBESITY 07/18/2007    No family history on file.  Past Surgical History:  Procedure Laterality Date  . BACK SURGERY    . DG FEMUR LEFT  ([facility] HX)     rod placed  . TOOTH EXTRACTION    .  TOTAL KNEE ARTHROPLASTY Right 07/26/2018   Procedure: RIGHT TOTAL KNEE ARTHROPLASTY;  Surgeon: Kerrin Champagne, MD;  Location: [facility] OR;  Service: Orthopedics;  Laterality: Right;   Social History   Occupational History  . Occupation: truck Hospital doctor  Tobacco Use  . Smoking status: Former Games developer  . Smokeless tobacco: Never Used  . Tobacco comment: Marijuana 30 years  Substance and Sexual Activity  . Alcohol use: Yes    Comment: occ  . Drug use: Never  . Sexual activity: Not on file

## 2018-09-13 ENCOUNTER — Ambulatory Visit (INDEPENDENT_AMBULATORY_CARE_PROVIDER_SITE_OTHER): Payer: Managed Care, Other (non HMO) | Admitting: Specialist

## 2018-09-13 ENCOUNTER — Ambulatory Visit (INDEPENDENT_AMBULATORY_CARE_PROVIDER_SITE_OTHER): Payer: Managed Care, Other (non HMO)

## 2018-09-13 ENCOUNTER — Encounter (INDEPENDENT_AMBULATORY_CARE_PROVIDER_SITE_OTHER): Payer: Self-pay | Admitting: Specialist

## 2018-09-13 VITALS — BP 118/78 | HR 88 | Ht 72.0 in | Wt 214.0 lb

## 2018-09-13 DIAGNOSIS — M545 Low back pain, unspecified: Secondary | ICD-10-CM

## 2018-09-13 DIAGNOSIS — M47812 Spondylosis without myelopathy or radiculopathy, cervical region: Secondary | ICD-10-CM

## 2018-09-13 DIAGNOSIS — Z96651 Presence of right artificial knee joint: Secondary | ICD-10-CM

## 2018-09-13 DIAGNOSIS — M4316 Spondylolisthesis, lumbar region: Secondary | ICD-10-CM

## 2018-09-13 DIAGNOSIS — M48062 Spinal stenosis, lumbar region with neurogenic claudication: Secondary | ICD-10-CM

## 2018-09-13 MED ORDER — ACETAMINOPHEN-CODEINE #4 300-60 MG PO TABS: 1.0000 | ORAL_TABLET | ORAL | 0 refills | Status: AC | PRN

## 2018-09-13 MED ORDER — METHYLPREDNISOLONE 4 MG PO TABS: ORAL_TABLET | ORAL | 0 refills | Status: AC

## 2018-09-13 NOTE — Patient Instructions (Addendum)
Avoid bending, stooping and avoid lifting weights greater than 10 lbs. Avoid prolong standing and walking. Avoid frequent bending and stooping  No lifting greater than 10 lbs. May use ice or moist heat for pain. Weight loss is of benefit. Handicap license is approved. Avoid overhead lifting and overhead use of the arms. Do not lift greater than 5 lbs. Adjust head rest in vehicle to prevent hyperextension if rear ended. Take extra precautions to avoid falling. See therapist for PT for lumbar spine and knee replacement. Start medrol 4 mg per day for 14 days then 1/2 tablet or 2 mg daily for 2 weeks. Tylenol#4 for pain 1 every 4-6 hours per pain.  Continue use of neurontin and muscle relaxer.  Cervical spine radiographs if cervical pain persists.

## 2018-09-13 NOTE — Progress Notes (Signed)
Office Visit Note   Patient: [name]           Date of Birth: Sep 07, 1951           MRN: [id number] Visit Date: 09/13/2018              Requested by: Corwin Levins, MD [address] [place], [place] [address] PCP: Corwin Levins, MD   Assessment & Plan: Visit Diagnoses:  1. History of total right knee replacement   2. Acute right-sided low back pain without sciatica   3. Spondylolisthesis, lumbar region   4. Spinal stenosis of lumbar region with neurogenic claudication   5. Spondylosis without myelopathy or radiculopathy, cervical region     Plan: Avoid bending, stooping and avoid lifting weights greater than 10 lbs. Avoid prolong standing and walking. Avoid frequent bending and stooping  No lifting greater than 10 lbs. May use ice or moist heat for pain. Weight loss is of benefit. Handicap license is approved. Avoid overhead lifting and overhead use of the arms. Do not lift greater than 5 lbs. Adjust head rest in vehicle to prevent hyperextension if rear ended. Take extra precautions to avoid falling. See therapist for PT for lumbar spine and knee replacement. Start medrol 4 mg per day for 14 days then 1/2 tablet or 2 mg daily for 2 weeks. Tylenol#4 for pain 1 every 4-6 hours per pain.  Continue use of neurontin and muscle relaxer.  Cervical spine radiographs if cervical pain persists.   Follow-Up Instructions: No follow-ups on file.   Orders:  Orders Placed This Encounter  Procedures  . XR Lumbar Spine 2-3 Views   Meds ordered this encounter  Medications  . acetaminophen-codeine (TYLENOL #4) 300-60 MG tablet    Sig: Take 1 tablet by mouth every 4 (four) hours as needed for up to 7 days for moderate pain.    Dispense:  30 tablet    Refill:  0  . methylPREDNISolone (MEDROL) 4 MG tablet    Sig: Take 1 tablet (4 mg total) by mouth daily for 14 days, THEN 0.5 tablets (2 mg total) daily for 14 days.    Dispense:  21 tablet    Refill:  0      Procedures: No procedures performed   Clinical Data: No additional findings.   Subjective: Chief Complaint  Patient presents with  . Right Knee - Routine Post Op  . Lower Back - Pain    67 year old male with history of spondylolisthesis and moderate stenosis, he is 7 weeks post right TKR and is experiencing increasing pain int the back, episodic, causes pain in the lumbosacral region, that is intense and into the right PSIS. He reports that the pain was severe early last week, he saw Dr. Prince Rome and he started a Stronger much stronger muscle relaxer. Which improved the pain then post PT on Thursday the pain recurred and improved again and now has recurred. There is an aching burning pain. He has been having pain in the right hip. PT advised to decrease the intensity of the therapy for his right knee. There is numbness sometimes on the right low back and bilateral ballls of the feet with numbness. The pain is there all the time now, started about 3 weeks following his right TKR.    Review of Systems  Constitutional: Negative.  Negative for activity change, appetite change, chills, diaphoresis, fatigue, fever and unexpected weight change.  HENT: Negative.   Eyes:  Negative.   Respiratory: Negative.   Cardiovascular: Negative.   Gastrointestinal: Negative.   Endocrine: Negative.   Genitourinary: Negative.   Musculoskeletal: Negative.   Skin: Negative.   Allergic/Immunologic: Negative.   Neurological: Negative.   Hematological: Negative.   Psychiatric/Behavioral: Negative.      Objective: Vital Signs: BP 118/78 (BP Location: Left Arm, Patient Position: Sitting)   Pulse 88   Ht 6' (1.829 m)   Wt 214 lb (97.1 kg)   BMI 29.02 kg/m   Physical Exam  Back Exam   Tenderness  The patient is experiencing tenderness in the lumbar.  Range of Motion  Extension: abnormal  Flexion: abnormal  Lateral bend right: normal  Lateral bend left: normal  Rotation right: normal  Rotation  left: normal   Muscle Strength  Right Quadriceps:  5/5  Left Quadriceps:  5/5  Right Hamstrings:  5/5  Left Hamstrings:  5/5   Tests  Straight leg raise right: negative Straight leg raise left: negative  Reflexes  Patellar: 0/4 Achilles: 0/4 Babinski's sign: normal       Specialty Comments:  No specialty comments available.  Imaging: Xr Lumbar Spine 2-3 Views  Result Date: 09/13/2018 AP and lateral flexion and extension radiographs shows Grade 1-2 anterolisthesis L4-5.  DDD L5-S1 and moderate L4-5. There is minimal anterolisthesis L2-3 1-2 mm. With flexion and extension there is increase anterlisthesis L4-5 from 8.5 to 10.4.     PMFS History: Patient Active Problem List   Diagnosis Date Noted  . Unilateral primary osteoarthritis, right knee 07/26/2018    Priority: High    Class: End Stage  . Anemia due to blood loss, acute 07/29/2018    Priority: Medium    Class: Acute  . DVT (deep venous thrombosis) (HCC) 07/28/2018  . S/P TKR (total knee replacement) using cement, right 07/26/2018  . Flu-like symptoms 01/23/2018  . Dyspnea 01/07/2018  . HTN (hypertension) 10/30/2016  . Cough 10/30/2016  . Asthma with exacerbation 10/30/2016  . Edema 11/22/2015  . Erectile dysfunction 01/17/2015  . Corneal abrasion, right, sequela 06/27/2014  . Encounter for well adult exam with abnormal findings 09/23/2011  . Impaired glucose tolerance 09/23/2011  . Hyperlipidemia 08/14/2010  . Extrinsic asthma 09/08/2009  . ALLERGIC RHINITIS 12/31/2007  . OBESITY 07/18/2007  . GERD 07/18/2007  . DEGENERATIVE JOINT DISEASE 07/18/2007   Past Medical History:  Diagnosis Date  . ALLERGIC RHINITIS 12/31/2007  . COUGH, CHRONIC 07/18/2007  . DEGENERATIVE JOINT DISEASE 07/18/2007  . Extrinsic asthma, unspecified 09/08/2009  . GERD 07/18/2007  . HYPERLIPIDEMIA 08/14/2010  . Hypertension   . Impaired glucose tolerance 09/23/2011  . OBESITY 07/18/2007    No family history on file.  Past  Surgical History:  Procedure Laterality Date  . BACK SURGERY    . DG FEMUR LEFT  ([facility] HX)     rod placed  . TOOTH EXTRACTION    . TOTAL KNEE ARTHROPLASTY Right 07/26/2018   Procedure: RIGHT TOTAL KNEE ARTHROPLASTY;  Surgeon: Kerrin Champagne, MD;  Location: [facility] OR;  Service: Orthopedics;  Laterality: Right;   Social History   Occupational History  . Occupation: truck Hospital doctor  Tobacco Use  . Smoking status: Former Games developer  . Smokeless tobacco: Never Used  . Tobacco comment: Marijuana 30 years  Substance and Sexual Activity  . Alcohol use: Yes    Comment: occ  . Drug use: Never  . Sexual activity: Not on file

## 2018-09-15 ENCOUNTER — Telehealth (INDEPENDENT_AMBULATORY_CARE_PROVIDER_SITE_OTHER): Payer: Self-pay | Admitting: Radiology

## 2018-09-15 NOTE — Telephone Encounter (Signed)
I spoke with Dr. Otelia Sergeant and he says for PCP to look at it. I called and advised patient of this

## 2018-09-15 NOTE — Telephone Encounter (Signed)
Patient called, he said he has a cyst that is on the back of his head.  Has been there for years- and he went to get his hair cut yesterday and they hit it/knicked it.  So it is draining a bit now.  He wants to know if you think it is something he needs to have removed/addressed at this time?  Since he just had the total knee he was worried that infection would go there.  He has had this cyst for many years and it comes and goes.

## 2018-09-16 ENCOUNTER — Encounter: Payer: Self-pay | Admitting: Physical Therapy

## 2018-09-16 ENCOUNTER — Ambulatory Visit: Payer: Managed Care, Other (non HMO) | Admitting: Physical Therapy

## 2018-09-16 DIAGNOSIS — M62830 Muscle spasm of back: Secondary | ICD-10-CM

## 2018-09-16 DIAGNOSIS — G8929 Other chronic pain: Secondary | ICD-10-CM

## 2018-09-16 DIAGNOSIS — R262 Difficulty in walking, not elsewhere classified: Secondary | ICD-10-CM

## 2018-09-16 DIAGNOSIS — M25561 Pain in right knee: Secondary | ICD-10-CM | POA: Diagnosis not present

## 2018-09-16 DIAGNOSIS — M545 Low back pain, unspecified: Secondary | ICD-10-CM

## 2018-09-16 DIAGNOSIS — R600 Localized edema: Secondary | ICD-10-CM

## 2018-09-16 NOTE — Therapy (Signed)
[facility] Outpatient Rehabilitation [facility] [address] [place], [place], [address] Phone: [phone]   Fax:  [phone]  Physical Therapy Treatment / Re-evaluation  Patient Details  Name: [name] MRN: [id number] Date of Birth: 30-Nov-1951 Referring Provider (PT): Vira Browns MD   Encounter Date: 09/16/2018  PT End of Session - 09/16/18 1055    Visit Number  3    Number of Visits  16    Date for PT Re-Evaluation  10/28/18    Authorization Type  [employer]    PT Start Time  1017    PT Stop Time  1100    PT Time Calculation (min)  43 min    Activity Tolerance  Patient tolerated treatment well    Behavior During Therapy  [facility] for tasks assessed/performed       Past Medical History:  Diagnosis Date  . ALLERGIC RHINITIS 12/31/2007  . COUGH, CHRONIC 07/18/2007  . DEGENERATIVE JOINT DISEASE 07/18/2007  . Extrinsic asthma, unspecified 09/08/2009  . GERD 07/18/2007  . HYPERLIPIDEMIA 08/14/2010  . Hypertension   . Impaired glucose tolerance 09/23/2011  . OBESITY 07/18/2007    Past Surgical History:  Procedure Laterality Date  . BACK SURGERY    . DG FEMUR LEFT  ([facility] HX)     rod placed  . TOOTH EXTRACTION    . TOTAL KNEE ARTHROPLASTY Right 07/26/2018   Procedure: RIGHT TOTAL KNEE ARTHROPLASTY;  Surgeon: Kerrin Champagne, MD;  Location: [facility] OR;  Service: Orthopedics;  Laterality: Right;    There were no vitals filed for this visit.  Subjective Assessment - 09/16/18 1019    Subjective  "last tuesday i went to the MD which they gave me a stronger muscle relaxer which is better. I did get a new referral to add back to my physical therapy"     Currently in Pain?  Yes    Pain Location  Knee    Multiple Pain Sites  Yes         [facility] PT Assessment - 09/16/18 0001      Assessment   Medical Diagnosis  Low back pain, myofascial pain     Referring Provider (PT)  Vira Browns MD    Hand Dominance  Right    Next MD Visit  unsure      Posture/Postural Control   Posture/Postural Control  Postural limitations    Postural Limitations  Rounded Shoulders;Forward head;Decreased lumbar lordosis      ROM / Strength   AROM / PROM / Strength  AROM;Strength      AROM   AROM Assessment Site  Lumbar    Right/Left Knee  Right;Left    Right Knee Extension  -15   -10 following manual and exercise   Right Knee Flexion  122    Left Knee Extension  -4    Lumbar Flexion  100    Lumbar Extension  -2    Lumbar - Right Side Bend  20    Lumbar - Left Side Bend  12      Strength   Strength Assessment Site  Knee;Hip    Right/Left Hip  Right;Left      Palpation   Palpation comment  TTP along bil lumbar paraspinal R>L      Ambulation/Gait   Assistive device  Straight cane    Gait Pattern  Step-through pattern;Decreased stride length;Decreased trunk rotation;Trunk flexed;Trendelenburg;Antalgic                   [facility]  Adult PT Treatment/Exercise - 09/16/18 0001      Exercises   Exercises  Knee/Hip;Lumbar      Lumbar Exercises: Stretches   Lower Trunk Rotation Limitations  2 x 10    cues to stay within pain free range   Prone Mid Back Stretch Limitations  seated low back stretch 1 x 30 sec   cues to stay within pain free range     Lumbar Exercises: Supine   Ab Set  10 reps;2 seconds    Bent Knee Raise  20 reps   2 x 10      Knee/Hip Exercises: Stretches   Active Hamstring Stretch  3 reps;30 seconds   PNF contract/ relax with 10 sec hold     Knee/Hip Exercises: Supine   Quad Sets  2 sets;10 reps   with towel under knee to promote quad activation     Manual Therapy   Manual Therapy  Joint mobilization    Joint Mobilization  Tibiofemoral PA grade 3 with foot in everted position to promote screw-home mechanism and faciliate final degrees of extension             PT Education - 09/16/18 1054    Education Details  reviewed lumbar assessment, and R knee ROM progression. and beenfits of flexion based exercises for the low back,  proper HEP.     Person(s) Educated  Patient    Methods  Explanation;Verbal cues    Comprehension  Verbalized understanding;Verbal cues required          PT Long Term Goals - 09/16/18 1253      PT LONG TERM GOAL #1   Title  Pt will be I and compliant with HEP.     Time  6    Period  Weeks    Status  On-going    Target Date  10/28/18      PT LONG TERM GOAL #2   Title  Pt will improve Rt knee ROM -3 to 120 to improve function.    Time  6    Period  Weeks    Status  New    Target Date  10/28/18      PT LONG TERM GOAL #3   Title  Pt will improve Rt hip/knee strength to 5/5 to improve funciton.     Time  6    Period  Weeks    Status  New    Target Date  10/28/18      PT LONG TERM GOAL #4   Title  Pt will be able to ambulate community distances without AD and functional gait.     Time  6    Period  Weeks    Status  New    Target Date  10/28/18      PT LONG TERM GOAL #5   Title  Pt will improve FOTO to less than 52% limited.     Time  6    Period  Weeks    Status  New    Target Date  10/28/18      Additional Long Term Goals   Additional Long Term Goals  Yes      PT LONG TERM GOAL #6   Title  pt to maintina trunk flexion,  increase trunk side bending bil to >/= 20 degrees, increase extension to neutral reporting </= 1/10 pain for functional ROM required for ADLs     Time  6    Period  Weeks  Status  New    Target Date  10/28/18            Plan - 09/16/18 1246    Clinical Impression Statement  pt presents to OPPT with updated script to include low back to current POC. He demonstrates limited trunk mobility especially with extension and L trunk sidebending. TTP noted along bil lumbar paraspinals with R>L, and exhibits decreased lumbar lordosis. pt demonstrates flexion bias which is consistent with dx and imaging of anterolisthesis of L4-L5 and L2-L3. R knee is making good progress regarding flexion but demonstrates regression of extension to -15 initally  compared to previous measures. He would benefit from physical therapy to decrease low back pain promote proper posture and core strength, and maximize function by addressing the deficits listed.     PT Treatment/Interventions  Cryotherapy;Electrical Stimulation;Iontophoresis 4mg /ml Dexamethasone;Moist Heat;Ultrasound;Therapeutic activities;Therapeutic exercise;Neuromuscular re-education;Manual techniques;Passive range of motion;Dry needling;Taping;Vasopneumatic Device;Joint Manipulations    PT Next Visit Plan  review HEP, STW along paraspinals, flexion biased treatment for the low back, continued knee/ hip strengthening,     PT Home Exercise Plan  H.S stretch, quad sets, mini squats, heel slides, ext prop    Consulted and Agree with Plan of Care  Patient       Patient will benefit from skilled therapeutic intervention in order to improve the following deficits and impairments:  Abnormal gait, Decreased activity tolerance, Decreased endurance, Decreased range of motion, Decreased strength, Hypomobility, Difficulty walking, Pain, Increased fascial restricitons, Decreased balance, Improper body mechanics, Postural dysfunction, Increased muscle spasms  Visit Diagnosis: Acute pain of right knee  Localized edema  Difficulty in walking, not elsewhere classified  Chronic bilateral low back pain, unspecified whether sciatica present  Muscle spasm of back     Problem List Patient Active Problem List   Diagnosis Date Noted  . Anemia due to blood loss, acute 07/29/2018    Class: Acute  . DVT (deep venous thrombosis) (HCC) 07/28/2018  . Unilateral primary osteoarthritis, right knee 07/26/2018    Class: End Stage  . S/P TKR (total knee replacement) using cement, right 07/26/2018  . Flu-like symptoms 01/23/2018  . Dyspnea 01/07/2018  . HTN (hypertension) 10/30/2016  . Cough 10/30/2016  . Asthma with exacerbation 10/30/2016  . Edema 11/22/2015  . Erectile dysfunction 01/17/2015  . Corneal  abrasion, right, sequela 06/27/2014  . Encounter for well adult exam with abnormal findings 09/23/2011  . Impaired glucose tolerance 09/23/2011  . Hyperlipidemia 08/14/2010  . Extrinsic asthma 09/08/2009  . ALLERGIC RHINITIS 12/31/2007  . OBESITY 07/18/2007  . GERD 07/18/2007  . DEGENERATIVE JOINT DISEASE 07/18/2007   Lulu Riding PT, DPT, LAT, ATC  09/16/18  12:59 PM      [facility] Outpatient Rehabilitation [facility] [address] [place], [place], [address] Phone: [phone]   Fax:  [phone]  Name: [name] MRN: [id number] Date of Birth: 1951/03/12

## 2018-09-17 ENCOUNTER — Encounter

## 2018-09-20 ENCOUNTER — Ambulatory Visit: Payer: Managed Care, Other (non HMO) | Admitting: Physical Therapy

## 2018-09-20 ENCOUNTER — Encounter: Payer: Self-pay | Admitting: Physical Therapy

## 2018-09-20 DIAGNOSIS — R262 Difficulty in walking, not elsewhere classified: Secondary | ICD-10-CM

## 2018-09-20 DIAGNOSIS — M62830 Muscle spasm of back: Secondary | ICD-10-CM

## 2018-09-20 DIAGNOSIS — G8929 Other chronic pain: Secondary | ICD-10-CM

## 2018-09-20 DIAGNOSIS — M25561 Pain in right knee: Secondary | ICD-10-CM

## 2018-09-20 DIAGNOSIS — R600 Localized edema: Secondary | ICD-10-CM

## 2018-09-20 DIAGNOSIS — M545 Low back pain: Secondary | ICD-10-CM

## 2018-09-20 NOTE — Therapy (Signed)
[facility] Outpatient Rehabilitation [facility] [address] [place], [place], [address] Phone: [phone]   Fax:  [phone]  Physical Therapy Treatment  Patient Details  Name: RIKI GEHRING MRN: [id number] Date of Birth: [birth year] Referring Provider (PT): Vira Browns MD   Encounter Date: 09/20/2018  PT End of Session - 09/20/18 0933    Visit Number  4    Number of Visits  16    Date for PT Re-Evaluation  10/28/18    Authorization Type  [employer]    PT Start Time  0933    PT Stop Time  1015    PT Time Calculation (min)  42 min    Activity Tolerance  Patient tolerated treatment well    Behavior During Therapy  [facility] for tasks assessed/performed       Past Medical History:  Diagnosis Date  . ALLERGIC RHINITIS 12/31/2007  . COUGH, CHRONIC 07/18/2007  . DEGENERATIVE JOINT DISEASE 07/18/2007  . Extrinsic asthma, unspecified 09/08/2009  . GERD 07/18/2007  . HYPERLIPIDEMIA 08/14/2010  . Hypertension   . Impaired glucose tolerance 09/23/2011  . OBESITY 07/18/2007    Past Surgical History:  Procedure Laterality Date  . BACK SURGERY    . DG FEMUR LEFT  ([facility] HX)     rod placed  . TOOTH EXTRACTION    . TOTAL KNEE ARTHROPLASTY Right 07/26/2018   Procedure: RIGHT TOTAL KNEE ARTHROPLASTY;  Surgeon: Kerrin Champagne, MD;  Location: [facility] OR;  Service: Orthopedics;  Laterality: Right;    There were no vitals filed for this visit.  Subjective Assessment - 09/20/18 0933    Subjective  "I was alittle sore after last session, I have been working on walking standing"    Currently in Pain?  Yes    Pain Score  0-No pain    Pain Orientation  Right         [facility] PT Assessment - 09/20/18 0938      AROM   Right Knee Extension  -6    Right Knee Flexion  124                   [facility] Adult PT Treatment/Exercise - 09/20/18 1000      Therapeutic Activites    Therapeutic Activities  Other Therapeutic Activities    Other Therapeutic Activities  proper technique/  posture getting into and out of bed log rolling 1 x 5 ea. using Lfoot to hook under R to help support the R knee.    intermittent cues needed for proper form      Exercises   Exercises  Knee/Hip;Lumbar      Lumbar Exercises: Stretches   Active Hamstring Stretch  3 reps;30 seconds   PNF contract/ relax with 5 sec hold   Hip Flexor Stretch  5 reps;20 seconds    Prone Mid Back Stretch Limitations  seated low back stretch 2 x 30 sec   walking hands along chair     Lumbar Exercises: Aerobic   Nustep  L6 x 5 min UE/LE      Lumbar Exercises: Supine   Ab Set  10 reps;2 seconds    Bent Knee Raise  20 reps   cues to keep back flat and core tight     Knee/Hip Exercises: Supine   Other Supine Knee/Hip Exercises  clamshells 2 x 10      Manual Therapy   Manual therapy comments  MTPR along the R lumbar paraspinals    Joint Mobilization  Tibiofemoral  PA grade 3 with foot in everted position to promote screw-home mechanism and faciliate final degrees of extension             PT Education - 09/20/18 1011    Education Details  updated HEP for clamshells     Person(s) Educated  Patient    Methods  Explanation;Verbal cues;Handout;Demonstration    Comprehension  Verbalized understanding;Verbal cues required;Returned demonstration          PT Long Term Goals - 09/16/18 1253      PT LONG TERM GOAL #1   Title  Pt will be I and compliant with HEP.     Time  6    Period  Weeks    Status  On-going    Target Date  10/28/18      PT LONG TERM GOAL #2   Title  Pt will improve Rt knee ROM -3 to 120 to improve function.    Time  6    Period  Weeks    Status  New    Target Date  10/28/18      PT LONG TERM GOAL #3   Title  Pt will improve Rt hip/knee strength to 5/5 to improve funciton.     Time  6    Period  Weeks    Status  New    Target Date  10/28/18      PT LONG TERM GOAL #4   Title  Pt will be able to ambulate community distances without AD and functional gait.     Time  6     Period  Weeks    Status  New    Target Date  10/28/18      PT LONG TERM GOAL #5   Title  Pt will improve FOTO to less than 52% limited.     Time  6    Period  Weeks    Status  New    Target Date  10/28/18      Additional Long Term Goals   Additional Long Term Goals  Yes      PT LONG TERM GOAL #6   Title  pt to maintina trunk flexion,  increase trunk side bending bil to >/= 20 degrees, increase extension to neutral reporting </= 1/10 pain for functional ROM required for ADLs     Time  6    Period  Weeks    Status  New    Target Date  10/28/18            Plan - 09/20/18 1018    Clinical Impression Statement  pt reports he has been doing the exercises at home and was sore initally but is doing better today. continued knee mobs to promote extension and core and hip strengthening which he required intermittent cues to keep back in neutral position. no increase in pain noted at end of session and provided handout for clamshells.     PT Treatment/Interventions  Cryotherapy;Electrical Stimulation;Iontophoresis 4mg /ml Dexamethasone;Moist Heat;Ultrasound;Therapeutic activities;Therapeutic exercise;Neuromuscular re-education;Manual techniques;Passive range of motion;Dry needling;Taping;Vasopneumatic Device;Joint Manipulations    PT Next Visit Plan  review HEP, STW along paraspinals, flexion biased treatment for the low back, continued knee/ hip strengthening,     PT Home Exercise Plan  H.S stretch, quad sets, mini squats, heel slides, ext prop, clamshells    Consulted and Agree with Plan of Care  Patient       Patient will benefit from skilled therapeutic intervention in order to improve the following deficits  and impairments:  Abnormal gait, Decreased activity tolerance, Decreased endurance, Decreased range of motion, Decreased strength, Hypomobility, Difficulty walking, Pain, Increased fascial restricitons, Decreased balance, Improper body mechanics, Postural dysfunction, Increased  muscle spasms  Visit Diagnosis: Acute pain of right knee  Difficulty in walking, not elsewhere classified  Localized edema  Chronic bilateral low back pain, unspecified whether sciatica present  Muscle spasm of back     Problem List Patient Active Problem List   Diagnosis Date Noted  . Anemia due to blood loss, acute 07/29/2018    Class: Acute  . DVT (deep venous thrombosis) (HCC) 07/28/2018  . Unilateral primary osteoarthritis, right knee 07/26/2018    Class: End Stage  . S/P TKR (total knee replacement) using cement, right 07/26/2018  . Flu-like symptoms 01/23/2018  . Dyspnea 01/07/2018  . HTN (hypertension) 10/30/2016  . Cough 10/30/2016  . Asthma with exacerbation 10/30/2016  . Edema 11/22/2015  . Erectile dysfunction 01/17/2015  . Corneal abrasion, right, sequela 06/27/2014  . Encounter for well adult exam with abnormal findings 09/23/2011  . Impaired glucose tolerance 09/23/2011  . Hyperlipidemia 08/14/2010  . Extrinsic asthma 09/08/2009  . ALLERGIC RHINITIS 12/31/2007  . OBESITY 07/18/2007  . GERD 07/18/2007  . DEGENERATIVE JOINT DISEASE 07/18/2007   Lulu Riding PT, DPT, LAT, ATC  09/20/18  10:28 AM      [facility] Outpatient Rehabilitation [facility] [address] [place], [place], [address] Phone: [phone]   Fax:  [phone]  Name: PHENIX VANDERMEULEN MRN: [id number] Date of Birth: 07/20/1951

## 2018-09-22 ENCOUNTER — Ambulatory Visit: Payer: Managed Care, Other (non HMO) | Admitting: Physical Therapy

## 2018-09-22 ENCOUNTER — Encounter: Payer: Self-pay | Admitting: Physical Therapy

## 2018-09-22 DIAGNOSIS — M545 Low back pain, unspecified: Secondary | ICD-10-CM

## 2018-09-22 DIAGNOSIS — R600 Localized edema: Secondary | ICD-10-CM

## 2018-09-22 DIAGNOSIS — R262 Difficulty in walking, not elsewhere classified: Secondary | ICD-10-CM

## 2018-09-22 DIAGNOSIS — G8929 Other chronic pain: Secondary | ICD-10-CM

## 2018-09-22 DIAGNOSIS — M25561 Pain in right knee: Secondary | ICD-10-CM

## 2018-09-22 DIAGNOSIS — M62830 Muscle spasm of back: Secondary | ICD-10-CM

## 2018-09-22 NOTE — Therapy (Signed)
[facility] [address] [place], [place], [address] Phone: [phone]   Fax:  [phone]  Physical Therapy Treatment  Patient Details  Name: [name] MRN: [id number] Date of Birth: February 18, 1951 Referring Provider (PT): Vira Browns MD   Encounter Date: 09/22/2018  PT End of Session - 09/22/18 1200    Visit Number  5    Number of Visits  16    Date for PT Re-Evaluation  10/28/18    Authorization Type  [employer]    PT Start Time  1103    PT Stop Time  1153    PT Time Calculation (min)  50 min    Activity Tolerance  Patient tolerated treatment well    Behavior During Therapy  [facility] for tasks assessed/performed       Past Medical History:  Diagnosis Date  . ALLERGIC RHINITIS 12/31/2007  . COUGH, CHRONIC 07/18/2007  . DEGENERATIVE JOINT DISEASE 07/18/2007  . Extrinsic asthma, unspecified 09/08/2009  . GERD 07/18/2007  . HYPERLIPIDEMIA 08/14/2010  . Hypertension   . Impaired glucose tolerance 09/23/2011  . OBESITY 07/18/2007    Past Surgical History:  Procedure Laterality Date  . BACK SURGERY    . DG FEMUR LEFT  ([facility] HX)     rod placed  . TOOTH EXTRACTION    . TOTAL KNEE ARTHROPLASTY Right 07/26/2018   Procedure: RIGHT TOTAL KNEE ARTHROPLASTY;  Surgeon: Kerrin Champagne, MD;  Location: [facility];  Service: Orthopedics;  Laterality: Right;    There were no vitals filed for this visit.  Subjective Assessment - 09/22/18 1104    Subjective  "I am feeling stiff and I still notice I have issues in that back especially laying on my side"     Currently in Pain?  Yes    Pain Score  0-No pain    Pain Location  Knee    Pain Orientation  Right    Pain Onset  More than a month ago    Pain Frequency  Intermittent    Multiple Pain Sites  Yes    Pain Score  2    Pain Location  Back    Pain Orientation  Right;Left    Pain Descriptors / Indicators  Sore    Pain Type  Chronic pain    Pain Onset  More than a month ago    Pain Frequency   Intermittent    Aggravating Factors   leaning backward    Pain Relieving Factors  medication          [facility] PT Assessment - 09/22/18 0001      AROM   Right Knee Extension  -10                   [facility] Adult PT Treatment/Exercise - 09/22/18 1108      Exercises   Exercises  Knee/Hip;Lumbar      Lumbar Exercises: Stretches   Active Hamstring Stretch  3 reps;30 seconds    Lower Trunk Rotation  2 reps;30 seconds    Hip Flexor Stretch  5 reps;20 seconds    Prone Mid Back Stretch Limitations  seated low back stretch 2 x 30 sec      Lumbar Exercises: Aerobic   Nustep  L6 x 5 min UE/LE      Lumbar Exercises: Standing   Other Standing Lumbar Exercises  pushing down through bil UE brathing out with pushing down and inhaling with relaxing. 2 x 10   with red  physioball on the table     Lumbar Exercises: Supine   Ab Set  10 reps;1 second   sliding hands up table for tactile cues    Bent Knee Raise  20 reps;1 second      Knee/Hip Exercises: Seated   Long Arc Quad  2 sets;10 reps      Knee/Hip Exercises: Supine   Quad Sets  1 set;10 reps   holding 3 sec   Other Supine Knee/Hip Exercises  clamshells 2 x 10   with red theraband     Vasopneumatic   Number Minutes Vasopneumatic   10 minutes    Vasopnuematic Location   Knee    Vasopneumatic Pressure  Low    Vasopneumatic Temperature   34      Manual Therapy   Manual Therapy  Other (comment)    Manual therapy comments  MTPR along the R lumbar paraspinals    Joint Mobilization  Tibiofemoral PA grade 3 with foot in everted position to promote screw-home mechanism and faciliate final degrees of extension   sitting with belt    Other Manual Therapy  tack and stretch of the hamstrings on the R using tennis                   PT Long Term Goals - 09/16/18 1253      PT LONG TERM GOAL #1   Title  Pt will be I and compliant with HEP.     Time  6    Period  Weeks    Status  On-going    Target Date  10/28/18       PT LONG TERM GOAL #2   Title  Pt will improve Rt knee ROM -3 to 120 to improve function.    Time  6    Period  Weeks    Status  New    Target Date  10/28/18      PT LONG TERM GOAL #3   Title  Pt will improve Rt hip/knee strength to 5/5 to improve funciton.     Time  6    Period  Weeks    Status  New    Target Date  10/28/18      PT LONG TERM GOAL #4   Title  Pt will be able to ambulate community distances without AD and functional gait.     Time  6    Period  Weeks    Status  New    Target Date  10/28/18      PT LONG TERM GOAL #5   Title  Pt will improve FOTO to less than 52% limited.     Time  6    Period  Weeks    Status  New    Target Date  10/28/18      Additional Long Term Goals   Additional Long Term Goals  Yes      PT LONG TERM GOAL #6   Title  pt to maintina trunk flexion,  increase trunk side bending bil to >/= 20 degrees, increase extension to neutral reporting </= 1/10 pain for functional ROM required for ADLs     Time  6    Period  Weeks    Status  New    Target Date  10/28/18            Plan - 09/22/18 1200    Clinical Impression Statement  continued noted soreness in the low back and swelling in  the R knee. He continues to exhibit flucutating swelling impacting extension which he got to -10 degrees today. continued working on core strengthening and soft tissue work along the hamstrings and tibiofemoral mobs. vaso end of session for knee swelling/ soreness.     PT Treatment/Interventions  Cryotherapy;Electrical Stimulation;Iontophoresis 4mg /ml Dexamethasone;Moist Heat;Ultrasound;Therapeutic activities;Therapeutic exercise;Neuromuscular re-education;Manual techniques;Passive range of motion;Dry needling;Taping;Vasopneumatic Device;Joint Manipulations    PT Next Visit Plan  review HEP, STW along paraspinals, flexion biased treatment for the low back, continued knee/ hip strengthening,     PT Home Exercise Plan  H.S stretch, quad sets, mini squats, heel  slides, ext prop, clamshells    Consulted and Agree with Plan of Care  Patient       Patient will benefit from skilled therapeutic intervention in order to improve the following deficits and impairments:  Abnormal gait, Decreased activity tolerance, Decreased endurance, Decreased range of motion, Decreased strength, Hypomobility, Difficulty walking, Pain, Increased fascial restricitons, Decreased balance, Improper body mechanics, Postural dysfunction, Increased muscle spasms  Visit Diagnosis: Acute pain of right knee  Difficulty in walking, not elsewhere classified  Localized edema  Chronic bilateral low back pain, unspecified whether sciatica present  Muscle spasm of back     Problem List Patient Active Problem List   Diagnosis Date Noted  . Anemia due to blood loss, acute 07/29/2018    Class: Acute  . DVT (deep venous thrombosis) ([facility]) 07/28/2018  . Unilateral primary osteoarthritis, right knee 07/26/2018    Class: End Stage  . S/P TKR (total knee replacement) using cement, right 07/26/2018  . Flu-like symptoms 01/23/2018  . Dyspnea 01/07/2018  . HTN (hypertension) 10/30/2016  . Cough 10/30/2016  . Asthma with exacerbation 10/30/2016  . Edema 11/22/2015  . Erectile dysfunction 01/17/2015  . Corneal abrasion, right, sequela 06/27/2014  . Encounter for well adult exam with abnormal findings 09/23/2011  . Impaired glucose tolerance 09/23/2011  . Hyperlipidemia 08/14/2010  . Extrinsic asthma 09/08/2009  . ALLERGIC RHINITIS 12/31/2007  . OBESITY 07/18/2007  . GERD 07/18/2007  . DEGENERATIVE JOINT DISEASE 07/18/2007    Lulu Riding PT, DPT, LAT, ATC  09/22/18  12:49 PM      [facility] [address] [place], [place], [address] Phone: [phone]   Fax:  [phone]  Name: [name] MRN: [id number] Date of Birth: 10-Aug-1951

## 2018-09-24 ENCOUNTER — Encounter

## 2018-09-27 ENCOUNTER — Ambulatory Visit: Payer: Managed Care, Other (non HMO) | Admitting: Physical Therapy

## 2018-09-27 DIAGNOSIS — R600 Localized edema: Secondary | ICD-10-CM

## 2018-09-27 DIAGNOSIS — M545 Low back pain: Secondary | ICD-10-CM

## 2018-09-27 DIAGNOSIS — M25561 Pain in right knee: Secondary | ICD-10-CM | POA: Diagnosis not present

## 2018-09-27 DIAGNOSIS — M62830 Muscle spasm of back: Secondary | ICD-10-CM

## 2018-09-27 DIAGNOSIS — R262 Difficulty in walking, not elsewhere classified: Secondary | ICD-10-CM

## 2018-09-27 DIAGNOSIS — M1711 Unilateral primary osteoarthritis, right knee: Secondary | ICD-10-CM

## 2018-09-27 DIAGNOSIS — G8929 Other chronic pain: Secondary | ICD-10-CM

## 2018-09-27 NOTE — Therapy (Signed)
[place], [place], [address] Phone: [phone]   Fax:  [phone]  Physical Therapy Treatment  Patient Details  Name: [name] MRN: [id number] Date of Birth: Oct 16, 1951 Referring Provider (PT): Basil Dess MD   Encounter Date: 09/27/2018  PT End of Session - 09/27/18 1207    Visit Number  6    Number of Visits  16    Date for PT Re-Evaluation  10/28/18    Authorization Type  [employer]    PT Start Time  1106    PT Stop Time  1200    PT Time Calculation (min)  54 min    Activity Tolerance  Patient tolerated treatment well    Behavior During Therapy  [facility] for tasks assessed/performed       Past Medical History:  Diagnosis Date  . ALLERGIC RHINITIS 12/31/2007  . COUGH, CHRONIC 07/18/2007  . DEGENERATIVE JOINT DISEASE 07/18/2007  . Extrinsic asthma, unspecified 09/08/2009  . GERD 07/18/2007  . HYPERLIPIDEMIA 08/14/2010  . Hypertension   . Impaired glucose tolerance 09/23/2011  . OBESITY 07/18/2007    Past Surgical History:  Procedure Laterality Date  . BACK SURGERY    . DG FEMUR LEFT  ([facility] HX)     rod placed  . TOOTH EXTRACTION    . TOTAL KNEE ARTHROPLASTY Right 07/26/2018   Procedure: RIGHT TOTAL KNEE ARTHROPLASTY;  Surgeon: Jessy Oto, MD;  Location: [place];  Service: Orthopedics;  Laterality: Right;    There were no vitals filed for this visit.  Subjective Assessment - 09/27/18 1153    Subjective  Pt relays his knee is swollen and may be due to walking a lot on saturday. He relays his back has calmed down    Currently in Pain?  Yes    Pain Score  2     Pain Location  Knee    Pain Orientation  Right    Pain Descriptors / Indicators  Aching;Tightness                       [facility] Adult PT Treatment/Exercise - 09/27/18 0001      Exercises   Exercises  Knee/Hip;Lumbar      Lumbar Exercises: Stretches   Active Hamstring Stretch  3 reps;30 seconds    Hip Flexor Stretch   Right;3 reps;30 seconds      Lumbar Exercises: Aerobic   Nustep  L6 x 5 min UE/LE      Knee/Hip Exercises: Standing   Terminal Knee Extension  Both;20 reps;Theraband    Theraband Level (Terminal Knee Extension)  Level 3 (Green)      Knee/Hip Exercises: Seated   Long Arc Quad  2 sets;10 reps    Long Arc Quad Weight  2 lbs.    Sit to [employer]  2 sets;5 reps      Knee/Hip Exercises: Supine   Quad Sets  1 set;15 reps   hold 5 sec   Short Arc Quad Sets  Right;2 sets;10 reps    Short Arc Quad Sets Limitations  2      Vasopneumatic   Number Minutes Vasopneumatic   15 minutes    Vasopnuematic Location   Knee    Vasopneumatic Pressure  Low    Vasopneumatic Temperature   34      Manual Therapy   Other Manual Therapy  KT tape for edema to Rt knee  PT Education - 09/27/18 1207    Education Details  KT tape edu    Person(s) Educated  Patient    Methods  Explanation;Verbal cues    Comprehension  Verbalized understanding          PT Long Term Goals - 09/27/18 1209      PT LONG TERM GOAL #1   Title  Pt will be I and compliant with HEP.     Time  6    Period  Weeks    Status  On-going      PT LONG TERM GOAL #2   Title  Pt will improve Rt knee ROM -3 to 120 to improve function.    Time  6    Period  Weeks    Status  On-going      PT LONG TERM GOAL #3   Title  Pt will improve Rt hip/knee strength to 5/5 to improve funciton.     Time  6    Period  Weeks    Status  Partially Met      PT LONG TERM GOAL #4   Title  Pt will be able to ambulate community distances without AD and functional gait.     Time  6    Period  Weeks    Status  On-going      PT LONG TERM GOAL #5   Title  Pt will improve FOTO to less than 52% limited.     Time  6    Period  Weeks    Status  On-going      PT LONG TERM GOAL #6   Title  pt to maintina trunk flexion,  increase trunk side bending bil to >/= 20 degrees, increase extension to neutral reporting </= 1/10 pain for  functional ROM required for ADLs     Time  6    Period  Weeks    Status  On-going            Plan - 09/27/18 1208    Clinical Impression Statement  His back was not bothering him so session focused more on Rt knee stretching and strengthening with emphasis on quad strength and ext ROM. He had good tolerance to therex but did have increased edema in Rt knee so he was treated with vaso and trialed with KT tape.    Rehab Potential  Good    Clinical Impairments Affecting Rehab Potential  sigficiant knee valgus and limped 20 years before TKA     PT Frequency  2x / week    PT Duration  6 weeks    PT Treatment/Interventions  Cryotherapy;Electrical Stimulation;Iontophoresis 60m/ml Dexamethasone;Moist Heat;Ultrasound;Therapeutic activities;Therapeutic exercise;Neuromuscular re-education;Manual techniques;Passive range of motion;Dry needling;Taping;Vasopneumatic Device;Joint Manipulations    PT Next Visit Plan  assess response to KT tape for edema, STW along paraspinals, flexion biased treatment for the low back, continued knee/ hip strengthening,     PT Home Exercise Plan  H.S stretch, quad sets, mini squats, heel slides, ext prop, clamshells    Consulted and Agree with Plan of Care  Patient       Patient will benefit from skilled therapeutic intervention in order to improve the following deficits and impairments:  Abnormal gait, Decreased activity tolerance, Decreased endurance, Decreased range of motion, Decreased strength, Hypomobility, Difficulty walking, Pain, Increased fascial restricitons, Decreased balance, Improper body mechanics, Postural dysfunction, Increased muscle spasms  Visit Diagnosis: Acute pain of right knee  Difficulty in walking, not elsewhere classified  Localized edema  Chronic bilateral low back pain, unspecified whether sciatica present  Muscle spasm of back  Primary osteoarthritis of right knee     Problem List Patient Active Problem List   Diagnosis Date  Noted  . Anemia due to blood loss, acute 07/29/2018    Class: Acute  . DVT (deep venous thrombosis) ([place]) 07/28/2018  . Unilateral primary osteoarthritis, right knee 07/26/2018    Class: End Stage  . S/P TKR (total knee replacement) using cement, right 07/26/2018  . Flu-like symptoms 01/23/2018  . Dyspnea 01/07/2018  . HTN (hypertension) 10/30/2016  . Cough 10/30/2016  . Asthma with exacerbation 10/30/2016  . Edema 11/22/2015  . Erectile dysfunction 01/17/2015  . Corneal abrasion, right, sequela 06/27/2014  . Encounter for well adult exam with abnormal findings 09/23/2011  . Impaired glucose tolerance 09/23/2011  . Hyperlipidemia 08/14/2010  . Extrinsic asthma 09/08/2009  . ALLERGIC RHINITIS 12/31/2007  . OBESITY 07/18/2007  . GERD 07/18/2007  . DEGENERATIVE JOINT DISEASE 07/18/2007    Debbe Odea, PT, DPT 09/27/2018, 12:11 PM  [facility] [address] [place], [place], [address] Phone: [phone]   Fax:  [phone]  Name: [name] MRN: [id number] Date of Birth: Mar 20, 1951

## 2018-09-29 ENCOUNTER — Ambulatory Visit: Payer: Managed Care, Other (non HMO) | Admitting: Physical Therapy

## 2018-09-29 ENCOUNTER — Encounter: Payer: Self-pay | Admitting: Physical Therapy

## 2018-09-29 DIAGNOSIS — M25561 Pain in right knee: Secondary | ICD-10-CM | POA: Diagnosis not present

## 2018-09-29 DIAGNOSIS — R600 Localized edema: Secondary | ICD-10-CM

## 2018-09-29 DIAGNOSIS — M62830 Muscle spasm of back: Secondary | ICD-10-CM

## 2018-09-29 DIAGNOSIS — G8929 Other chronic pain: Secondary | ICD-10-CM

## 2018-09-29 DIAGNOSIS — R262 Difficulty in walking, not elsewhere classified: Secondary | ICD-10-CM

## 2018-09-29 DIAGNOSIS — M545 Low back pain: Secondary | ICD-10-CM

## 2018-09-29 NOTE — Therapy (Signed)
[place], [place], [address] Phone: [phone]   Fax:  [phone]  Physical Therapy Treatment  Patient Details  Name: [name] MRN: [id number] Date of Birth: 03/04/1951 Referring Provider (PT): Basil Dess MD   Encounter Date: 09/29/2018  PT End of Session - 09/29/18 1329    Visit Number  7    Number of Visits  16    Date for PT Re-Evaluation  10/28/18    PT Start Time  1330    PT Stop Time  1425    PT Time Calculation (min)  55 min    Activity Tolerance  Patient tolerated treatment well    Behavior During Therapy  [facility] for tasks assessed/performed       Past Medical History:  Diagnosis Date  . ALLERGIC RHINITIS 12/31/2007  . COUGH, CHRONIC 07/18/2007  . DEGENERATIVE JOINT DISEASE 07/18/2007  . Extrinsic asthma, unspecified 09/08/2009  . GERD 07/18/2007  . HYPERLIPIDEMIA 08/14/2010  . Hypertension   . Impaired glucose tolerance 09/23/2011  . OBESITY 07/18/2007    Past Surgical History:  Procedure Laterality Date  . BACK SURGERY    . DG FEMUR LEFT  ([facility] HX)     rod placed  . TOOTH EXTRACTION    . TOTAL KNEE ARTHROPLASTY Right 07/26/2018   Procedure: RIGHT TOTAL KNEE ARTHROPLASTY;  Surgeon: Jessy Oto, MD;  Location: [place];  Service: Orthopedics;  Laterality: Right;    There were no vitals filed for this visit.  Subjective Assessment - 09/29/18 1331    Subjective  Back is a little twingy but okay. Walking at home wihout cane.     Patient Stated Goals  improve knee ext ROM, get stregth and endurance back                       [facility] Adult PT Treatment/Exercise - 09/29/18 0001      Exercises   Exercises  Knee/Hip      Knee/Hip Exercises: Stretches   Active Hamstring Stretch  Right;5 reps   2 sets   Piriformis Stretch  Right;2 reps;30 seconds    Gastroc Stretch Limitations  30s slant board      Knee/Hip Exercises: Aerobic   Stationary Bike  5 min L2       Knee/Hip Exercises: Machines for Strengthening   Total Gym Leg Press  1 plate bil LE      Knee/Hip Exercises: Standing   Other Standing Knee Exercises  wall squats      Knee/Hip Exercises: Seated   Long Arc Quad Weight  3 lbs.    Long [employer] Limitations  quick ext, slow lower      Knee/Hip Exercises: Sidelying   Hip ABduction  20 reps;Right      Vasopneumatic   Number Minutes Vasopneumatic   15 minutes    Vasopnuematic Location   Knee    Vasopneumatic Pressure  Low    Vasopneumatic Temperature   34      Manual Therapy   Manual Therapy  Soft tissue mobilization    Soft tissue mobilization  IASTM Hamstrings & gastroc/soleus                  PT Long Term Goals - 09/27/18 1209      PT LONG TERM GOAL #1   Title  Pt will be I and compliant with HEP.     Time  6    Period  Weeks    Status  On-going      PT LONG TERM GOAL #2   Title  Pt will improve Rt knee ROM -3 to 120 to improve function.    Time  6    Period  Weeks    Status  On-going      PT LONG TERM GOAL #3   Title  Pt will improve Rt hip/knee strength to 5/5 to improve funciton.     Time  6    Period  Weeks    Status  Partially Met      PT LONG TERM GOAL #4   Title  Pt will be able to ambulate community distances without AD and functional gait.     Time  6    Period  Weeks    Status  On-going      PT LONG TERM GOAL #5   Title  Pt will improve FOTO to less than 52% limited.     Time  6    Period  Weeks    Status  On-going      PT LONG TERM GOAL #6   Title  pt to maintina trunk flexion,  increase trunk side bending bil to >/= 20 degrees, increase extension to neutral reporting </= 1/10 pain for functional ROM required for ADLs     Time  6    Period  Weeks    Status  On-going            Plan - 09/29/18 1419    Clinical Impression Statement  continues to do well concerning back pain with occasional twinges reported. complains of soreness along posterior leg and is still trying to push  his knee straight. Calf soreness but without redness or warmth. History of DVT and discussed the importance of monitoring and go to ED ASAP if needed. Good heel strike but cont to demo antalgic gait without SPC. May go to gym with his daughter and discussed use of bike and leg press for safe exercise.     PT Treatment/Interventions  Cryotherapy;Electrical Stimulation;Iontophoresis '4mg'$ /ml Dexamethasone;Moist Heat;Ultrasound;Therapeutic activities;Therapeutic exercise;Neuromuscular re-education;Manual techniques;Passive range of motion;Dry needling;Taping;Vasopneumatic Device;Joint Manipulations    PT Next Visit Plan  STW PRN along paraspinals, flexion biased treatment for the low back, continued knee/ hip strengthening, balance/gait training in // bars    PT Home Exercise Plan  H.S stretch, quad sets, mini squats, heel slides, ext prop, clamshells, AHSS    Consulted and Agree with Plan of Care  Patient       Patient will benefit from skilled therapeutic intervention in order to improve the following deficits and impairments:  Abnormal gait, Decreased activity tolerance, Decreased endurance, Decreased range of motion, Decreased strength, Hypomobility, Difficulty walking, Pain, Increased fascial restricitons, Decreased balance, Improper body mechanics, Postural dysfunction, Increased muscle spasms  Visit Diagnosis: Acute pain of right knee  Difficulty in walking, not elsewhere classified  Localized edema  Chronic bilateral low back pain, unspecified whether sciatica present  Muscle spasm of back     Problem List Patient Active Problem List   Diagnosis Date Noted  . Anemia due to blood loss, acute 07/29/2018    Class: Acute  . DVT (deep venous thrombosis) ([place]) 07/28/2018  . Unilateral primary osteoarthritis, right knee 07/26/2018    Class: End Stage  . S/P TKR (total knee replacement) using cement, right 07/26/2018  . Flu-like symptoms 01/23/2018  . Dyspnea 01/07/2018  . HTN  (hypertension) 10/30/2016  . Cough 10/30/2016  . Asthma with  exacerbation 10/30/2016  . Edema 11/22/2015  . Erectile dysfunction 01/17/2015  . Corneal abrasion, right, sequela 06/27/2014  . Encounter for well adult exam with abnormal findings 09/23/2011  . Impaired glucose tolerance 09/23/2011  . Hyperlipidemia 08/14/2010  . Extrinsic asthma 09/08/2009  . ALLERGIC RHINITIS 12/31/2007  . OBESITY 07/18/2007  . GERD 07/18/2007  . DEGENERATIVE JOINT DISEASE 07/18/2007    Barbara Keng C. Sofiya Ezelle PT, DPT 09/29/18 2:26 PM   [facility] [address] [place], [place], [address] Phone: [phone]   Fax:  [phone]  Name: [name] MRN: [id number] Date of Birth: 15-Feb-1951

## 2018-10-04 ENCOUNTER — Ambulatory Visit: Payer: Managed Care, Other (non HMO) | Attending: Specialist | Admitting: Physical Therapy

## 2018-10-04 DIAGNOSIS — R600 Localized edema: Secondary | ICD-10-CM | POA: Insufficient documentation

## 2018-10-04 DIAGNOSIS — G8929 Other chronic pain: Secondary | ICD-10-CM | POA: Insufficient documentation

## 2018-10-04 DIAGNOSIS — R262 Difficulty in walking, not elsewhere classified: Secondary | ICD-10-CM | POA: Insufficient documentation

## 2018-10-04 DIAGNOSIS — M1711 Unilateral primary osteoarthritis, right knee: Secondary | ICD-10-CM | POA: Insufficient documentation

## 2018-10-04 DIAGNOSIS — M25561 Pain in right knee: Secondary | ICD-10-CM | POA: Insufficient documentation

## 2018-10-04 DIAGNOSIS — M62830 Muscle spasm of back: Secondary | ICD-10-CM | POA: Insufficient documentation

## 2018-10-04 DIAGNOSIS — M545 Low back pain: Secondary | ICD-10-CM | POA: Insufficient documentation

## 2018-10-06 ENCOUNTER — Encounter: Payer: Self-pay | Admitting: Physical Therapy

## 2018-10-06 ENCOUNTER — Ambulatory Visit: Payer: Managed Care, Other (non HMO) | Admitting: Physical Therapy

## 2018-10-06 DIAGNOSIS — M1711 Unilateral primary osteoarthritis, right knee: Secondary | ICD-10-CM | POA: Diagnosis present

## 2018-10-06 DIAGNOSIS — R262 Difficulty in walking, not elsewhere classified: Secondary | ICD-10-CM

## 2018-10-06 DIAGNOSIS — R600 Localized edema: Secondary | ICD-10-CM

## 2018-10-06 DIAGNOSIS — G8929 Other chronic pain: Secondary | ICD-10-CM

## 2018-10-06 DIAGNOSIS — M25561 Pain in right knee: Secondary | ICD-10-CM | POA: Diagnosis present

## 2018-10-06 DIAGNOSIS — M62830 Muscle spasm of back: Secondary | ICD-10-CM

## 2018-10-06 DIAGNOSIS — M545 Low back pain: Secondary | ICD-10-CM

## 2018-10-06 NOTE — Therapy (Signed)
[place], [place], [address] Phone: [phone]   Fax:  [phone]  Physical Therapy Treatment  Patient Details  Name: [name] MRN: [id number] Date of Birth: May 10, 1951 Referring Provider (PT): Basil Dess MD   Encounter Date: 10/06/2018  PT End of Session - 10/06/18 1106    Visit Number  8    Number of Visits  16    Date for PT Re-Evaluation  10/28/18    Authorization Type  [employer]    PT Start Time  1105    PT Stop Time  1158    PT Time Calculation (min)  53 min    Activity Tolerance  Patient tolerated treatment well       Past Medical History:  Diagnosis Date  . ALLERGIC RHINITIS 12/31/2007  . COUGH, CHRONIC 07/18/2007  . DEGENERATIVE JOINT DISEASE 07/18/2007  . Extrinsic asthma, unspecified 09/08/2009  . GERD 07/18/2007  . HYPERLIPIDEMIA 08/14/2010  . Hypertension   . Impaired glucose tolerance 09/23/2011  . OBESITY 07/18/2007    Past Surgical History:  Procedure Laterality Date  . BACK SURGERY    . DG FEMUR LEFT  ([place] HX)     rod placed  . TOOTH EXTRACTION    . TOTAL KNEE ARTHROPLASTY Right 07/26/2018   Procedure: RIGHT TOTAL KNEE ARTHROPLASTY;  Surgeon: Jessy Oto, MD;  Location: [facility];  Service: Orthopedics;  Laterality: Right;    There were no vitals filed for this visit.  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 of cab and pushing pallets     Patient Stated Goals  improve knee ext ROM, get stregth and endurance back    Pain Location  Knee    Pain Orientation  Right         [facility] PT Assessment - 10/06/18 0001      Assessment   Medical Diagnosis  Low back pain, myofascial pain     Referring Provider (PT)  Basil Dess MD    Hand Dominance  Right      AROM   Right Knee Extension  -7    Left Knee Extension  -3                   [facility] Adult PT  Treatment/Exercise - 10/06/18 0001      Exercises   Exercises  Knee/Hip      Knee/Hip Exercises: Aerobic   Stationary Bike  5 min L2       Knee/Hip Exercises: Machines for Strengthening   Cybex Knee Extension  3x1015#, lowering with Rt LE only    until fatigue   Cybex Knee Flexion  25# to fatigue    Total Gym Leg Press  3 plates 2x10      Knee/Hip Exercises: Standing   Wall Squat  10 reps   then 10 reps reaching out hlding 6# wt.      Vasopneumatic   Number Minutes Vasopneumatic   15 minutes    Vasopnuematic Location   Knee    Vasopneumatic Pressure  Medium    Vasopneumatic Temperature   3*                  PT Long Term Goals - 09/27/18 1209      PT LONG TERM GOAL #1   Title  Pt will be  I and compliant with HEP.     Time  6    Period  Weeks    Status  On-going      PT LONG TERM GOAL #2   Title  Pt will improve Rt knee ROM -3 to 120 to improve function.    Time  6    Period  Weeks    Status  On-going      PT LONG TERM GOAL #3   Title  Pt will improve Rt hip/knee strength to 5/5 to improve funciton.     Time  6    Period  Weeks    Status  Partially Met      PT LONG TERM GOAL #4   Title  Pt will be able to ambulate community distances without AD and functional gait.     Time  6    Period  Weeks    Status  On-going      PT LONG TERM GOAL #5   Title  Pt will improve FOTO to less than 52% limited.     Time  6    Period  Weeks    Status  On-going      PT LONG TERM GOAL #6   Title  pt to maintina trunk flexion,  increase trunk side bending bil to >/= 20 degrees, increase extension to neutral reporting </= 1/10 pain for functional ROM required for ADLs     Time  6    Period  Weeks    Status  On-going            Plan - 10/06/18 1146    Clinical Impression Statement  [name] has slight improvement in knee extension, quad is very weak and fatigues quickly. He has a very physical job and will need a lot more strength to be safe to return.   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, he is looking to join a gym for strengthening until he returns to work    Rehab Potential  Good    Clinical Impairments Affecting Rehab Potential  sigficiant knee valgus and limped 20 years before TKA     PT Frequency  2x / week    PT Duration  6 weeks    PT Treatment/Interventions  Cryotherapy;Electrical Stimulation;Iontophoresis '4mg'$ /ml Dexamethasone;Moist Heat;Ultrasound;Therapeutic activities;Therapeutic exercise;Neuromuscular re-education;Manual techniques;Passive range of motion;Dry needling;Taping;Vasopneumatic Device;Joint Manipulations    PT Next Visit Plan  lower body strengthening, work simulation    Consulted and Agree with Plan of Care  Patient       Patient will benefit from skilled therapeutic intervention in order to improve the following deficits and impairments:  Abnormal gait, Decreased activity tolerance, Decreased endurance, Decreased range of motion, Decreased strength, Hypomobility, Difficulty walking, Pain, Increased fascial restricitons, Decreased balance, Improper body mechanics, Postural dysfunction, Increased muscle spasms  Visit Diagnosis: Acute pain of right knee  Difficulty in walking, not elsewhere classified  Localized edema  Chronic bilateral low back pain, unspecified whether sciatica present  Muscle spasm of back     Problem List Patient Active Problem List   Diagnosis Date Noted  . Anemia due to blood loss, acute 07/29/2018    Class: Acute  . DVT (deep venous thrombosis) ([place]) 07/28/2018  . Unilateral primary osteoarthritis, right knee 07/26/2018    Class: End Stage  . S/P TKR (total knee replacement) using cement, right 07/26/2018  . Flu-like symptoms 01/23/2018  . Dyspnea 01/07/2018  . HTN (hypertension) 10/30/2016  . Cough 10/30/2016  . Asthma with exacerbation  10/30/2016  . Edema 11/22/2015  . Erectile dysfunction 01/17/2015  . Corneal abrasion, right, sequela 06/27/2014  . Encounter for well  adult exam with abnormal findings 09/23/2011  . Impaired glucose tolerance 09/23/2011  . Hyperlipidemia 08/14/2010  . Extrinsic asthma 09/08/2009  . ALLERGIC RHINITIS 12/31/2007  . OBESITY 07/18/2007  . GERD 07/18/2007  . DEGENERATIVE JOINT DISEASE 07/18/2007    Boneta Lucks rPT  10/06/2018, 11:53 AM  [facility] [address] [place], [place], [address] Phone: [phone]   Fax:  [phone]  Name: [name] MRN: [id number] Date of Birth: 04-06-1951

## 2018-10-11 ENCOUNTER — Ambulatory Visit: Payer: Managed Care, Other (non HMO) | Admitting: Physical Therapy

## 2018-10-11 ENCOUNTER — Encounter: Payer: Self-pay | Admitting: Physical Therapy

## 2018-10-11 DIAGNOSIS — M62830 Muscle spasm of back: Secondary | ICD-10-CM

## 2018-10-11 DIAGNOSIS — G8929 Other chronic pain: Secondary | ICD-10-CM

## 2018-10-11 DIAGNOSIS — R262 Difficulty in walking, not elsewhere classified: Secondary | ICD-10-CM

## 2018-10-11 DIAGNOSIS — M545 Low back pain: Secondary | ICD-10-CM

## 2018-10-11 DIAGNOSIS — M25561 Pain in right knee: Secondary | ICD-10-CM

## 2018-10-11 DIAGNOSIS — R600 Localized edema: Secondary | ICD-10-CM

## 2018-10-11 NOTE — Therapy (Signed)
[place], [place], [address] Phone: [phone]   Fax:  [phone]  Physical Therapy Treatment  Patient Details  Name: [name] MRN: [id number] Date of Birth: 02-03-1951 Referring Provider (PT): Basil Dess MD   Encounter Date: 10/11/2018  PT End of Session - 10/11/18 1010    Visit Number  9    Number of Visits  16    Date for PT Re-Evaluation  10/28/18    Authorization Type  [employer]    PT Start Time  1015    PT Stop Time  1105    PT Time Calculation (min)  50 min    Activity Tolerance  Patient tolerated treatment well    Behavior During Therapy  [facility] for tasks assessed/performed       Past Medical History:  Diagnosis Date  . ALLERGIC RHINITIS 12/31/2007  . COUGH, CHRONIC 07/18/2007  . DEGENERATIVE JOINT DISEASE 07/18/2007  . Extrinsic asthma, unspecified 09/08/2009  . GERD 07/18/2007  . HYPERLIPIDEMIA 08/14/2010  . Hypertension   . Impaired glucose tolerance 09/23/2011  . OBESITY 07/18/2007    Past Surgical History:  Procedure Laterality Date  . BACK SURGERY    . DG FEMUR LEFT  ([place] HX)     rod placed  . TOOTH EXTRACTION    . TOTAL KNEE ARTHROPLASTY Right 07/26/2018   Procedure: RIGHT TOTAL KNEE ARTHROPLASTY;  Surgeon: Jessy Oto, MD;  Location: [facility];  Service: Orthopedics;  Laterality: Right;    There were no vitals filed for this visit.  Subjective Assessment - 10/11/18 1011    Subjective  "my hip his alittle sore on the R but overall i am only 1/10. The back is okay"     Currently in Pain?  Yes    Pain Score  1     Pain Orientation  Right    Pain Descriptors / Indicators  Aching;Sore         [facility] PT Assessment - 10/11/18 1023      Observation/Other Assessments   Focus on Therapeutic Outcomes (FOTO)   53% limited                   [facility] Adult PT Treatment/Exercise - 10/11/18 1024      Therapeutic Activites    Therapeutic Activities  Other Therapeutic  Activities    Other Therapeutic Activities  lifting from floor to waist with 25# box 2 x 5and pushing sled to mimic work related activities, cues for proper posture to promote LE use versus trunk   cues and demonstration for proper form     Knee/Hip Exercises: Stretches   Active Hamstring Stretch  2 reps;30 seconds;Right    ITB Stretch  2 reps;30 seconds   with strap   Piriformis Stretch  2 reps;30 seconds      Knee/Hip Exercises: Aerobic   Stationary Bike  L3 x 5 min      Knee/Hip Exercises: Seated   Other Seated Knee/Hip Exercises  hip ER/IR 2 x 10       Modalities   Modalities  Moist Heat      Cryotherapy   Number Minutes Cryotherapy  10 Minutes    Cryotherapy Location  Knee    Type of Cryotherapy  Ice pack      Manual Therapy   Manual therapy comments  MTPR along the glute med and piriformis             PT Education - 10/11/18  1213    Education Details  reviewed lifting mechanics and pushing/ pulling activities     Person(s) Educated  Patient    Methods  Explanation;Verbal cues    Comprehension  Verbalized understanding;Verbal cues required          PT Long Term Goals - 09/27/18 1209      PT LONG TERM GOAL #1   Title  Pt will be I and compliant with HEP.     Time  6    Period  Weeks    Status  On-going      PT LONG TERM GOAL #2   Title  Pt will improve Rt knee ROM -3 to 120 to improve function.    Time  6    Period  Weeks    Status  On-going      PT LONG TERM GOAL #3   Title  Pt will improve Rt hip/knee strength to 5/5 to improve funciton.     Time  6    Period  Weeks    Status  Partially Met      PT LONG TERM GOAL #4   Title  Pt will be able to ambulate community distances without AD and functional gait.     Time  6    Period  Weeks    Status  On-going      PT LONG TERM GOAL #5   Title  Pt will improve FOTO to less than 52% limited.     Time  6    Period  Weeks    Status  On-going      PT LONG TERM GOAL #6   Title  pt to maintina  trunk flexion,  increase trunk side bending bil to >/= 20 degrees, increase extension to neutral reporting </= 1/10 pain for functional ROM required for ADLs     Time  6    Period  Weeks    Status  On-going            Plan - 10/11/18 1213    Clinical Impression Statement  pt arrived without use of SPC but demonstrates lateral postural sway with walking with limited heel strike/ stance on the RLE. worked on hip/ knee stretching, and use of manual techniques to reduce spams. 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  utilized MHP for R postierior hip and ice for the knee end of session.     PT Treatment/Interventions  Cryotherapy;Electrical Stimulation;Iontophoresis '4mg'$ /ml Dexamethasone;Moist Heat;Ultrasound;Therapeutic activities;Therapeutic exercise;Neuromuscular re-education;Manual techniques;Passive range of motion;Dry needling;Taping;Vasopneumatic Device;Joint Manipulations    PT Next Visit Plan  lower body strengthening, work simulation    PT Home Exercise Plan  H.S stretch, quad sets, mini squats, heel slides, ext prop, clamshells, AHSS    Consulted and Agree with Plan of Care  Patient       Patient will benefit from skilled therapeutic intervention in order to improve the following deficits and impairments:  Abnormal gait, Decreased activity tolerance, Decreased endurance, Decreased range of motion, Decreased strength, Hypomobility, Difficulty walking, Pain, Increased fascial restricitons, Decreased balance, Improper body mechanics, Postural dysfunction, Increased muscle spasms  Visit Diagnosis: Acute pain of right knee  Difficulty in walking, not elsewhere classified  Localized edema  Chronic bilateral low back pain, unspecified whether sciatica present  Muscle  spasm of  back     Problem List Patient Active Problem List   Diagnosis Date Noted  . Anemia due to blood loss, acute 07/29/2018    Class: Acute  . DVT (deep venous thrombosis) ([place]) 07/28/2018  . Unilateral primary osteoarthritis, right knee 07/26/2018    Class: End Stage  . S/P TKR (total knee replacement) using cement, right 07/26/2018  . Flu-like symptoms 01/23/2018  . Dyspnea 01/07/2018  . HTN (hypertension) 10/30/2016  . Cough 10/30/2016  . Asthma with exacerbation 10/30/2016  . Edema 11/22/2015  . Erectile dysfunction 01/17/2015  . Corneal abrasion, right, sequela 06/27/2014  . Encounter for well adult exam with abnormal findings 09/23/2011  . Impaired glucose tolerance 09/23/2011  . Hyperlipidemia 08/14/2010  . Extrinsic asthma 09/08/2009  . ALLERGIC RHINITIS 12/31/2007  . OBESITY 07/18/2007  . GERD 07/18/2007  . DEGENERATIVE JOINT DISEASE 07/18/2007   Starr Lake PT, DPT, LAT, ATC  10/11/18  12:17 PM      [facility] [address] [place], [place], [address] Phone: [phone]   Fax:  [phone]  Name: [name] MRN: [id number] Date of Birth: 1951-04-16

## 2018-10-13 ENCOUNTER — Encounter (INDEPENDENT_AMBULATORY_CARE_PROVIDER_SITE_OTHER): Payer: Self-pay | Admitting: Specialist

## 2018-10-13 ENCOUNTER — Telehealth (INDEPENDENT_AMBULATORY_CARE_PROVIDER_SITE_OTHER): Payer: Self-pay | Admitting: Specialist

## 2018-10-13 ENCOUNTER — Encounter: Payer: Self-pay | Admitting: Physical Therapy

## 2018-10-13 ENCOUNTER — Ambulatory Visit (INDEPENDENT_AMBULATORY_CARE_PROVIDER_SITE_OTHER): Payer: Managed Care, Other (non HMO) | Admitting: Specialist

## 2018-10-13 ENCOUNTER — Ambulatory Visit: Payer: Managed Care, Other (non HMO) | Admitting: Physical Therapy

## 2018-10-13 VITALS — BP 143/92 | HR 81 | Temp 97.1°F | Ht 72.0 in | Wt 221.0 lb

## 2018-10-13 DIAGNOSIS — M4316 Spondylolisthesis, lumbar region: Secondary | ICD-10-CM | POA: Diagnosis not present

## 2018-10-13 DIAGNOSIS — M48062 Spinal stenosis, lumbar region with neurogenic claudication: Secondary | ICD-10-CM | POA: Diagnosis not present

## 2018-10-13 DIAGNOSIS — M62830 Muscle spasm of back: Secondary | ICD-10-CM

## 2018-10-13 DIAGNOSIS — R262 Difficulty in walking, not elsewhere classified: Secondary | ICD-10-CM | POA: Diagnosis not present

## 2018-10-13 DIAGNOSIS — Z96651 Presence of right artificial knee joint: Secondary | ICD-10-CM | POA: Diagnosis not present

## 2018-10-13 DIAGNOSIS — R600 Localized edema: Secondary | ICD-10-CM

## 2018-10-13 DIAGNOSIS — M545 Low back pain, unspecified: Secondary | ICD-10-CM

## 2018-10-13 DIAGNOSIS — G8929 Other chronic pain: Secondary | ICD-10-CM

## 2018-10-13 DIAGNOSIS — M25561 Pain in right knee: Secondary | ICD-10-CM

## 2018-10-13 NOTE — Telephone Encounter (Signed)
Sched patient for appointment 11/25/18 @9 :45am  4 week f/u needed  Morning preferred

## 2018-10-13 NOTE — Patient Instructions (Signed)
Plan:Avoid bending, stooping and avoid lifting weights greater than 10 lbs. Avoid prolong standing and walking. Avoid frequent bending and stooping  No lifting greater than 10 lbs. May use ice or moist heat for pain. Weight loss is of benefit.  Dr. Bolivar BlasNewton's secretary/Assistant will call to arrange for epidural steroid injection  Continue ROM right knee with stretching and strengthening exercises.

## 2018-10-13 NOTE — Progress Notes (Signed)
Post-Op Visit Note   Patient: Joseph FarberRobert M [name]           Date of Birth: 25-Dec-1950           MRN: [id number] Visit Date: 10/13/2018 PCP: Joseph LevinsJohn, Joseph Najera W, MD   Assessment & Plan: 2 1/2 months post right Total knee replacement. Lumbar spondylolisthesis with spinal stenosis.   Chief Complaint:  Chief Complaint  Patient presents with  . Lower Back - Pain, Follow-up  . Right Knee - Follow-up   Visit Diagnoses:  1. Spondylolisthesis, lumbar region   2. Spinal stenosis of lumbar region with neurogenic claudication   3. Status post right knee replacement    Incision right knee is healed, ROM 10-125 degrees. Mild warm right knee with effusion 1+ Knee stable to varus and valgus.  SLR is negative Motor is normal  Plan:Avoid bending, stooping and avoid lifting weights greater than 10 lbs. Avoid prolong standing and walking. Avoid frequent bending and stooping  No lifting greater than 10 lbs. May use ice or moist heat for pain. Weight loss is of benefit. Handicap license is approved. Dr. Bethesda BlasNewton's secretary/Assistant will call to arrange for epidural steroid injection  Continue ROM right knee with stretching and strengthening exercises.   Follow-Up Instructions: Return in about 4 weeks (around 11/10/2018).   Orders:  Orders Placed This Encounter  Procedures  . Ambulatory referral to Physical Medicine Rehab   No orders of the defined types were placed in this encounter.   Imaging: No results found.  PMFS History: Patient Active Problem List   Diagnosis Date Noted  . Unilateral primary osteoarthritis, right knee 07/26/2018    Priority: High    Class: End Stage  . Anemia due to blood loss, acute 07/29/2018    Priority: Medium    Class: Acute  . DVT (deep venous thrombosis) ([facility]) 07/28/2018  . S/P TKR (total knee replacement) using cement, right 07/26/2018  . Flu-like symptoms 01/23/2018  . Dyspnea 01/07/2018  . HTN (hypertension) 10/30/2016  . Cough 10/30/2016  .  Asthma with exacerbation 10/30/2016  . Edema 11/22/2015  . Erectile dysfunction 01/17/2015  . Corneal abrasion, right, sequela 06/27/2014  . Encounter for well adult exam with abnormal findings 09/23/2011  . Impaired glucose tolerance 09/23/2011  . Hyperlipidemia 08/14/2010  . Extrinsic asthma 09/08/2009  . ALLERGIC RHINITIS 12/31/2007  . OBESITY 07/18/2007  . GERD 07/18/2007  . DEGENERATIVE JOINT DISEASE 07/18/2007   Past Medical History:  Diagnosis Date  . ALLERGIC RHINITIS 12/31/2007  . COUGH, CHRONIC 07/18/2007  . DEGENERATIVE JOINT DISEASE 07/18/2007  . Extrinsic asthma, unspecified 09/08/2009  . GERD 07/18/2007  . HYPERLIPIDEMIA 08/14/2010  . Hypertension   . Impaired glucose tolerance 09/23/2011  . OBESITY 07/18/2007    History reviewed. No pertinent family history.  Past Surgical History:  Procedure Laterality Date  . BACK SURGERY    . DG FEMUR LEFT  ([facility] HX)     rod placed  . TOOTH EXTRACTION    . TOTAL KNEE ARTHROPLASTY Right 07/26/2018   Procedure: RIGHT TOTAL KNEE ARTHROPLASTY;  Surgeon: Kerrin ChampagneNitka, Robyn Galati E, MD;  Location[facility];  Service: Orthopedics;  Laterality: Right;   Social History   Occupational History  . 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 Smoking status: Former Games developermoker  . Smokeless tobacco: Never Used  . Tobacco comment: Marijuana 30 years  Substance and Sexual Activity  . Alcohol use: Yes    Comment: occ  . Drug use: Never  . Sexual activity: Not  on file

## 2018-10-13 NOTE — Therapy (Signed)
[place], [place], [address] Phone: [phone]   Fax:  [phone]  Physical Therapy Treatment  Patient Details  Name: [name] MRN: [id number] Date of Birth: Dec 16, 1950 Referring Provider (PT): Basil Dess MD   Encounter Date: 10/13/2018  PT End of Session - 10/13/18 0758    Visit Number  10    Number of Visits  16    Date for PT Re-Evaluation  10/28/18    Authorization Type  [employer]    PT Start Time  0800    PT Stop Time  0848    PT Time Calculation (min)  48 min    Activity Tolerance  Patient tolerated treatment well    Behavior During Therapy  [facility] for tasks assessed/performed       Past Medical History:  Diagnosis Date  . ALLERGIC RHINITIS 12/31/2007  . COUGH, CHRONIC 07/18/2007  . DEGENERATIVE JOINT DISEASE 07/18/2007  . Extrinsic asthma, unspecified 09/08/2009  . GERD 07/18/2007  . HYPERLIPIDEMIA 08/14/2010  . Hypertension   . Impaired glucose tolerance 09/23/2011  . OBESITY 07/18/2007    Past Surgical History:  Procedure Laterality Date  . BACK SURGERY    . DG FEMUR LEFT  ([place] HX)     rod placed  . TOOTH EXTRACTION    . TOTAL KNEE ARTHROPLASTY Right 07/26/2018   Procedure: RIGHT TOTAL KNEE ARTHROPLASTY;  Surgeon: Jessy Oto, MD;  Location: [facility];  Service: Orthopedics;  Laterality: Right;    There were no vitals filed for this visit.  Subjective Assessment - 10/13/18 0759    Subjective  "I am feeling pretty sore in the R leg and hamstring"     Patient Stated Goals  improve knee ext ROM, get stregth and endurance back    Currently in Pain?  Yes    Pain Score  1     Pain Location  Knee    Pain Orientation  Right    Pain Descriptors / Indicators  Aching;Sore    Pain Type  Chronic pain    Pain Onset  More than a month ago    Pain Frequency  Intermittent    Aggravating Factors   standing/ walking for too long.     Pain Score  1    Pain Location  Back    Pain Orientation   Right;Left    Pain Descriptors / Indicators  Sore    Pain Type  Chronic pain    Pain Onset  More than a month ago    Pain Frequency  Intermittent    Aggravating Factors   standing/ walking for a long period of time    Pain Relieving Factors  medication                       [facility] Adult PT Treatment/Exercise - 10/13/18 0824      Knee/Hip Exercises: Aerobic   Recumbent Bike  L2 x 5 min       Knee/Hip Exercises: Standing   Gait Training  forward and retro walking 10 x in // cues to focus on smaller steps and utilized heel strike/ toe off      Modalities   Modalities  Moist Heat      Moist Heat Therapy   Number Minutes Moist Heat  10 Minutes    Moist Heat Location  Knee   along hamstrings     Cryotherapy   Number Minutes Cryotherapy  10 Minutes  Cryotherapy Location  Knee    Type of Cryotherapy  Ice pack      Manual Therapy   Manual Therapy  Neural Stretch    Manual therapy comments  MTPR along R hamstring, and piriformis     Soft tissue mobilization  IASTM along R hamstring and piriformis    Neural Stretch  sciatic nerve glide in sitting 2 x 20                  PT Long Term Goals - 09/27/18 1209      PT LONG TERM GOAL #1   Title  Pt will be I and compliant with HEP.     Time  6    Period  Weeks    Status  On-going      PT LONG TERM GOAL #2   Title  Pt will improve Rt knee ROM -3 to 120 to improve function.    Time  6    Period  Weeks    Status  On-going      PT LONG TERM GOAL #3   Title  Pt will improve Rt hip/knee strength to 5/5 to improve funciton.     Time  6    Period  Weeks    Status  Partially Met      PT LONG TERM GOAL #4   Title  Pt will be able to ambulate community distances without AD and functional gait.     Time  6    Period  Weeks    Status  On-going      PT LONG TERM GOAL #5   Title  Pt will improve FOTO to less than 52% limited.     Time  6    Period  Weeks    Status  On-going      PT LONG TERM GOAL #6    Title  pt to maintina trunk flexion,  increase trunk side bending bil to >/= 20 degrees, increase extension to neutral reporting </= 1/10 pain for functional ROM required for ADLs     Time  6    Period  Weeks    Status  On-going            Plan - 10/13/18 0901    Clinical Impression Statement  pt continues to report pain in the R hip and hamstring tightness. focused on STW and stretching and practiced efficent gait training which he required cues for proper form. trialed sciatic nerve glides to relieve nerve tension. continued ice on the knee and heat on the back of the knee.     PT Treatment/Interventions  Cryotherapy;Electrical Stimulation;Iontophoresis '4mg'$ /ml Dexamethasone;Moist Heat;Ultrasound;Therapeutic activities;Therapeutic exercise;Neuromuscular re-education;Manual techniques;Passive range of motion;Dry needling;Taping;Vasopneumatic Device;Joint Manipulations    PT Next Visit Plan  lower body strengthening, work simulation    PT Home Exercise Plan  H.S stretch, quad sets, mini squats, heel slides, ext prop, clamshells, AHSS    Consulted and Agree with Plan of Care  Patient       Patient will benefit from skilled therapeutic intervention in order to improve the following deficits and impairments:  Abnormal gait, Decreased activity tolerance, Decreased endurance, Decreased range of motion, Decreased strength, Hypomobility, Difficulty walking, Pain, Increased fascial restricitons, Decreased balance, Improper body mechanics, Postural dysfunction, Increased muscle spasms  Visit Diagnosis: Acute pain of right knee  Difficulty in walking, not elsewhere classified  Localized edema  Chronic bilateral low back pain, unspecified whether sciatica present  Muscle spasm of back  Problem List Patient Active Problem List   Diagnosis Date Noted  . Anemia due to blood loss, acute 07/29/2018    Class: Acute  . DVT (deep venous thrombosis) ([place]) 07/28/2018  . Unilateral primary  osteoarthritis, right knee 07/26/2018    Class: End Stage  . S/P TKR (total knee replacement) using cement, right 07/26/2018  . Flu-like symptoms 01/23/2018  . Dyspnea 01/07/2018  . HTN (hypertension) 10/30/2016  . Cough 10/30/2016  . Asthma with exacerbation 10/30/2016  . Edema 11/22/2015  . Erectile dysfunction 01/17/2015  . Corneal abrasion, right, sequela 06/27/2014  . Encounter for well adult exam with abnormal findings 09/23/2011  . Impaired glucose tolerance 09/23/2011  . Hyperlipidemia 08/14/2010  . Extrinsic asthma 09/08/2009  . ALLERGIC RHINITIS 12/31/2007  . OBESITY 07/18/2007  . GERD 07/18/2007  . DEGENERATIVE JOINT DISEASE 07/18/2007   Starr Lake PT, DPT, LAT, ATC  10/13/18  9:03 AM      [facility] [address] [place], [place], [address] Phone: [phone]   Fax:  [phone]  Name: [name] MRN: [id number] Date of Birth: 01/08/1951

## 2018-10-13 NOTE — Addendum Note (Signed)
Addended by: Vira BrownsNITKA, Ming Kunka on: 10/13/2018 03:15 PM   Modules accepted: Level of Service

## 2018-10-14 NOTE — Telephone Encounter (Signed)
I put him on the cancellation list 

## 2018-10-18 ENCOUNTER — Ambulatory Visit: Payer: Managed Care, Other (non HMO) | Admitting: Physical Therapy

## 2018-10-20 ENCOUNTER — Ambulatory Visit: Payer: Managed Care, Other (non HMO) | Admitting: Physical Therapy

## 2018-10-20 ENCOUNTER — Encounter: Payer: Self-pay | Admitting: Physical Therapy

## 2018-10-20 DIAGNOSIS — M62830 Muscle spasm of back: Secondary | ICD-10-CM

## 2018-10-20 DIAGNOSIS — R262 Difficulty in walking, not elsewhere classified: Secondary | ICD-10-CM

## 2018-10-20 DIAGNOSIS — G8929 Other chronic pain: Secondary | ICD-10-CM

## 2018-10-20 DIAGNOSIS — M545 Low back pain: Secondary | ICD-10-CM

## 2018-10-20 DIAGNOSIS — M1711 Unilateral primary osteoarthritis, right knee: Secondary | ICD-10-CM

## 2018-10-20 NOTE — Therapy (Signed)
[facility] [address] [place], [place], [address] Phone: [phone]   Fax:  [phone]  Physical Therapy Treatment  Patient Details  Name: [name] MRN: [id number] Date of Birth: Jul 16, 1951 Referring Provider (PT): Vira Browns MD   Encounter Date: 10/20/2018  PT End of Session - 10/20/18 1720    Visit Number  11    Number of Visits  16    Date for PT Re-Evaluation  10/28/18    Authorization Type  [employer]    PT Start Time  0730    PT Stop Time  0805    PT Time Calculation (min)  35 min    Activity Tolerance  Patient tolerated treatment well    Behavior During Therapy  [facility] for tasks assessed/performed       Past Medical History:  Diagnosis Date  . ALLERGIC RHINITIS 12/31/2007  . COUGH, CHRONIC 07/18/2007  . DEGENERATIVE JOINT DISEASE 07/18/2007  . Extrinsic asthma, unspecified 09/08/2009  . GERD 07/18/2007  . HYPERLIPIDEMIA 08/14/2010  . Hypertension   . Impaired glucose tolerance 09/23/2011  . OBESITY 07/18/2007    Past Surgical History:  Procedure Laterality Date  . BACK SURGERY    . DG FEMUR LEFT  ([facility] HX)     rod placed  . TOOTH EXTRACTION    . TOTAL KNEE ARTHROPLASTY Right 07/26/2018   Procedure: RIGHT TOTAL KNEE ARTHROPLASTY;  Surgeon: Kerrin Champagne, MD;  Location: [facility] OR;  Service: Orthopedics;  Laterality: Right;    There were no vitals filed for this visit.  Subjective Assessment - 10/20/18 1707    Subjective  i want to work on the back today it is really bothering me.  I am going to get an injection next week.  I have not had one in awhile.  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  Yes    Pain Score  --   no nomber gien,  not bad.   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  to avid QL spasm/      Lumbar Exercises: Stretches   Other Lumbar Stretch Exercise  Quadratus lumborum stretches on side over floded sheet left, sitting side bend to left,  sidelyiny arm overhead,     Other Lumbar Stretch Exercise  Mckenzie lateral technique moving hips to the left ( laterally shifteda0  Lumbar Exercises: Standing   Other Standing Lumbar Exercises  pelvic tilt against wall      Manual Therapy   Soft tissue mobilization  low back gluteal   Quadratus lumborum tissue softenes ,  Strumming to lengthen.            PT Education - 10/20/18 1720    Education Details  HEP,  anatomy     Person(s) Educated  Patient    Methods  Explanation;Demonstration;Tactile cues;Handout;Verbal cues    Comprehension  Verbalized understanding;Returned demonstration          PT Long Term Goals - 10/20/18 1724      PT LONG TERM GOAL #1   Title  Pt will be I and compliant with HEP.     Baseline  does some of the exercises when he can    Time  6    Period  Weeks    Status  On-going      PT LONG TERM GOAL #2   Title  Pt will improve Rt knee ROM -3 to 120 to improve function.    Time  6    Period  Weeks    Status  Unable to assess      PT LONG TERM GOAL #3   Title  Pt will improve Rt hip/knee strength to 5/5 to improve funciton.     Time  6    Period  Weeks    Status  Unable to assess       PT LONG TERM GOAL #4   Title  Pt will be able to ambulate community distances without AD and functional gait.     Baseline  walking difficult today due to back pain    Time  6    Period  Weeks    Status  On-going      PT LONG TERM GOAL #5   Title  Pt will improve FOTO to less than 52% limited.     Time  6    Period  Weeks    Status  Unable to assess      PT LONG TERM GOAL #6   Title  pt to maintina trunk flexion,  increase trunk side bending bil to >/= 20 degrees, increase extension to neutral reporting </= 1/10 pain for functional ROM required for ADLs     Time  6   unable to do above.   Period  Weeks    Status  On-going            Plan - 10/20/18 1721    Clinical Impression Statement  Back focus today at patient's request initially.  Quadratus lumborum softened and spasm decreased wunit he got standing.  I was unable to correct latral shift.  Pain in back returned  at end of session..  patient was dissapointed there was no time for knee exercise today ( 30 minute session and he asked for back treatment  today).    PT Next Visit Plan  lower body strengthening, work simulation    PT Home Exercise Plan  H.S stretch, quad sets, mini squats, heel slides, ext prop, clamshells, AHSS    Consulted and Agree with Plan of Care  Patient       Patient will benefit from skilled therapeutic intervention in order to improve the following deficits and impairments:     Visit Diagnosis: Difficulty in walking, not elsewhere classified  Chronic bilateral low back pain, unspecified whether sciatica present  Muscle spasm of back  Primary  osteoarthritis of right knee     Problem List Patient Active Problem List   Diagnosis Date Noted  . Anemia due to blood loss, acute 07/29/2018    Class: Acute  . DVT (deep venous thrombosis) (HCC) 07/28/2018  . Unilateral primary osteoarthritis, right knee 07/26/2018    Class: End Stage  . S/P TKR (total knee replacement) using cement, right  07/26/2018  . Flu-like symptoms 01/23/2018  . Dyspnea 01/07/2018  . HTN (hypertension) 10/30/2016  . Cough 10/30/2016  . Asthma with exacerbation 10/30/2016  . Edema 11/22/2015  . Erectile dysfunction 01/17/2015  . Corneal abrasion, right, sequela 06/27/2014  . Encounter for well adult exam with abnormal findings 09/23/2011  . Impaired glucose tolerance 09/23/2011  . Hyperlipidemia 08/14/2010  . Extrinsic asthma 09/08/2009  . ALLERGIC RHINITIS 12/31/2007  . OBESITY 07/18/2007  . GERD 07/18/2007  . DEGENERATIVE JOINT DISEASE 07/18/2007    HARRIS,KAREN  PTA 10/20/2018, 5:26 PM  [facility] [address] [place], [place], [address] Phone: [phone]   Fax:  [phone]  Name: [name] MRN: [id number] Date of Birth: [birth year]

## 2018-10-26 ENCOUNTER — Encounter (INDEPENDENT_AMBULATORY_CARE_PROVIDER_SITE_OTHER): Payer: Self-pay | Admitting: Physical Medicine and Rehabilitation

## 2018-10-26 ENCOUNTER — Encounter

## 2018-10-26 ENCOUNTER — Ambulatory Visit (INDEPENDENT_AMBULATORY_CARE_PROVIDER_SITE_OTHER): Payer: Managed Care, Other (non HMO) | Admitting: Physical Medicine and Rehabilitation

## 2018-10-26 ENCOUNTER — Ambulatory Visit (INDEPENDENT_AMBULATORY_CARE_PROVIDER_SITE_OTHER): Payer: Self-pay

## 2018-10-26 VITALS — BP 128/90 | HR 82 | Temp 98.1°F

## 2018-10-26 DIAGNOSIS — M5416 Radiculopathy, lumbar region: Secondary | ICD-10-CM

## 2018-10-26 MED ORDER — BETAMETHASONE SOD PHOS & ACET 6 (3-3) MG/ML IJ SUSP
12.0000 mg | Freq: Once | INTRAMUSCULAR | Status: AC
  Administered 2018-10-26: 12 mg

## 2018-10-26 NOTE — Progress Notes (Signed)
 .  Numeric Pain Rating Scale and Functional Assessment Average Pain 8   In the last MONTH (on 0-10 scale) has pain interfered with the following?  1. General activity like being  able to carry out your everyday physical activities such as walking, climbing stairs, carrying groceries, or moving a chair?  Rating(4)   +Driver, -BT, -Dye Allergies.  

## 2018-10-26 NOTE — Patient Instructions (Signed)

## 2018-11-01 ENCOUNTER — Ambulatory Visit: Payer: Managed Care, Other (non HMO) | Attending: Specialist | Admitting: Physical Therapy

## 2018-11-01 ENCOUNTER — Telehealth (INDEPENDENT_AMBULATORY_CARE_PROVIDER_SITE_OTHER): Payer: Self-pay | Admitting: Physical Medicine and Rehabilitation

## 2018-11-01 DIAGNOSIS — M25561 Pain in right knee: Secondary | ICD-10-CM | POA: Diagnosis present

## 2018-11-01 DIAGNOSIS — G8929 Other chronic pain: Secondary | ICD-10-CM | POA: Insufficient documentation

## 2018-11-01 DIAGNOSIS — M545 Low back pain: Secondary | ICD-10-CM | POA: Diagnosis present

## 2018-11-01 DIAGNOSIS — M1711 Unilateral primary osteoarthritis, right knee: Secondary | ICD-10-CM | POA: Insufficient documentation

## 2018-11-01 DIAGNOSIS — M62830 Muscle spasm of back: Secondary | ICD-10-CM | POA: Diagnosis present

## 2018-11-01 DIAGNOSIS — R262 Difficulty in walking, not elsewhere classified: Secondary | ICD-10-CM | POA: Insufficient documentation

## 2018-11-01 NOTE — Telephone Encounter (Signed)
If relief sticking with him was 40% or more can repeat if Rosann AuerbachCigna will allow, or if not much relief can do L5 position. Need to run by Dr. Otelia SergeantNitka, has follow up with him scheduled

## 2018-11-01 NOTE — Therapy (Addendum)
[place], [place], [address] Phone: [phone]   Fax:  [phone]  Physical Therapy Re-Evaluation/Discharge addendum  Patient Details  Name: [name] MRN: [id number] Date of Birth: 05/27/1951 Referring Provider (PT): Basil Dess MD   Encounter Date: 11/01/2018  PT End of Session - 11/01/18 0818    Visit Number  12    Number of Visits  20    Date for PT Re-Evaluation  11/29/18    Authorization Type  [employer]    PT Start Time  0800    PT Stop Time  4287   last 7 min on ice   PT Time Calculation (min)  55 min    Activity Tolerance  Patient tolerated treatment well    Behavior During Therapy  [facility] for tasks assessed/performed       Past Medical History:  Diagnosis Date  . ALLERGIC RHINITIS 12/31/2007  . COUGH, CHRONIC 07/18/2007  . DEGENERATIVE JOINT DISEASE 07/18/2007  . Extrinsic asthma, unspecified 09/08/2009  . GERD 07/18/2007  . HYPERLIPIDEMIA 08/14/2010  . Hypertension   . Impaired glucose tolerance 09/23/2011  . OBESITY 07/18/2007    Past Surgical History:  Procedure Laterality Date  . BACK SURGERY    . DG FEMUR LEFT  ([place] HX)     rod placed  . TOOTH EXTRACTION    . TOTAL KNEE ARTHROPLASTY Right 07/26/2018   Procedure: RIGHT TOTAL KNEE ARTHROPLASTY;  Surgeon: Jessy Oto, MD;  Location: [place];  Service: Orthopedics;  Laterality: Right;    There were no vitals filed for this visit.   Subjective Assessment - 11/01/18 0814    Subjective  Pt relays he had back injection which helped some but his back is still his biggest complaint today. He relays "I can live with the knee"    Currently in Pain?  Yes    Pain Score  2     Pain Location  Knee    Pain Orientation  Right    Pain Descriptors / Indicators  Aching;Tightness    Pain Type  Chronic pain    Pain Score  7    Pain Location  Back    Pain Orientation  Right;Left;Lower    Pain Descriptors / Indicators  Shooting    Pain Type   Chronic pain    Pain Onset  More than a month ago    Pain Frequency  Intermittent    Aggravating Factors   im back to work light duty and this may be aggravating my back         [facility] PT Assessment - 11/01/18 0001      Assessment   Medical Diagnosis  Low back pain, myofascial pain     Referring Provider (PT)  Basil Dess MD    Onset Date/Surgical Date  07/26/18    Next MD Visit  11/25/18      [facility]   Right Knee Extension  -2    Right Knee Flexion  122   moderate swelling in knee today   Lumbar Flexion  100    Lumbar Extension  25%    Lumbar - Right Side Bend  [facility]    Lumbar - Left Side Bend  [facility]      Strength   Strength Assessment Site  Knee    Right/Left Hip  Right    Right/Left Knee  Right    Right Knee Flexion  5/5    Right Knee Extension  5/5  Objective measurements completed on examination: See above findings.      [facility] Adult PT Treatment/Exercise - 11/01/18 0001      Exercises   Exercises  Lumbar      Lumbar Exercises: Stretches   Passive Hamstring Stretch  Right;Left;2 reps;30 seconds    Single Knee to Chest Stretch  Right;Left;5 reps;10 seconds    Lower Trunk Rotation  5 reps;10 seconds    Piriformis Stretch  Right;Left;2 reps;30 seconds    Other Lumbar Stretch Exercise  seated lumbar flexion P ball roll outs 10 sec X 5 fwd, Lt,Rt      Lumbar Exercises: Aerobic   Nustep  L5 x 5 min UE/LE      Lumbar Exercises: Supine   Pelvic Tilt  20 reps    Bridge  15 reps    Bridge Limitations  shorter ROM for pain free    Other Supine Lumbar Exercises  feet on P ball knees to chest with ab set X 20      Cryotherapy   Number Minutes Cryotherapy  7 Minutes    Cryotherapy Location  Knee    Type of Cryotherapy  Ice pack      Manual Therapy   Other Manual Therapy  lumbar central PA mobs gentle grade 1-2, STM with biofreeze and MFR to lumbar paraspinals Rt>Lt             PT Education - 11/01/18 0931    Education Details  POC, need  to extend, RE findings    Person(s) Educated  Patient    Methods  Explanation    Comprehension  Verbalized understanding          PT Long Term Goals - 11/01/18 0848      PT LONG TERM GOAL #1   Title  Pt will be I and compliant with HEP.     Baseline  does some of the exercises when he can    Time  6    Period  Weeks    Status  On-going      PT LONG TERM GOAL #2   Title  Pt will improve Rt knee ROM -3 to 120 to improve function.    Time  6    Period  Weeks    Status  Achieved      PT LONG TERM GOAL #3   Title  Pt will improve Rt hip/knee strength to 5/5 to improve funciton.     Time  6    Period  Weeks    Status  Achieved      PT LONG TERM GOAL #4   Title  Pt will be able to ambulate community distances without AD and functional gait.     Baseline  walking difficult today due to back pain    Time  6    Period  Weeks    Status  On-going      PT LONG TERM GOAL #5   Title  Pt will improve FOTO to less than 52% limited.     Baseline  limited by back pain today    Time  6    Period  Weeks    Status  On-going      PT LONG TERM GOAL #6   Title  pt to maintina trunk flexion,  increase trunk side bending bil to >/= 20 degrees, increase extension to neutral reporting </= 1/10 pain for functional ROM required for ADLs     Time  6  Period  Weeks    Status  Partially Met             Plan - 11/01/18 0932    Clinical Impression Statement  Recert performed today as he is out of POC. He has made good progress with his knee but has been limited the last 2 weeks due to back pain. Measurements and goals updated today and he will benefit from continued to PT to address his remaining deficits in standing/walking tolerance, back pain, and back ROM, and progress him back to full work duties. He continues to show good effort with PT and is motivated to continue. PT recommending 2 times per week for 4 more weeks   Rehab Potential  Good    Clinical Impairments Affecting Rehab  Potential  sigficiant knee valgus and limped 20 years before TKA     PT Frequency  2x / week    PT Duration  6 weeks    PT Treatment/Interventions  Cryotherapy;Electrical Stimulation;Iontophoresis '4mg'$ /ml Dexamethasone;Moist Heat;Ultrasound;Therapeutic activities;Therapeutic exercise;Neuromuscular re-education;Manual techniques;Passive range of motion;Dry needling;Taping;Vasopneumatic Device;Joint Manipulations    PT Next Visit Plan  lower body strengthening, work simulation, back strengthening and stretching    PT Home Exercise Plan  H.S stretch, quad sets, mini squats, heel slides, ext prop, clamshells, AHSS    Consulted and Agree with Plan of Care  Patient       Patient will benefit from skilled therapeutic intervention in order to improve the following deficits and impairments:  Abnormal gait, Decreased activity tolerance, Decreased endurance, Decreased range of motion, Decreased strength, Hypomobility, Difficulty walking, Pain, Increased fascial restricitons, Decreased balance, Improper body mechanics, Postural dysfunction, Increased muscle spasms  Visit Diagnosis: Difficulty in walking, not elsewhere classified  Chronic bilateral low back pain, unspecified whether sciatica present  Muscle spasm of back  Primary osteoarthritis of right knee  Acute pain of right knee     Problem List Patient Active Problem List   Diagnosis Date Noted  . Anemia due to blood loss, acute 07/29/2018    Class: Acute  . DVT (deep venous thrombosis) ([place]) 07/28/2018  . Unilateral primary osteoarthritis, right knee 07/26/2018    Class: End Stage  . S/P TKR (total knee replacement) using cement, right 07/26/2018  . Flu-like symptoms 01/23/2018  . Dyspnea 01/07/2018  . HTN (hypertension) 10/30/2016  . Cough 10/30/2016  . Asthma with exacerbation 10/30/2016  . Edema 11/22/2015  . Erectile dysfunction 01/17/2015  . Corneal abrasion, right, sequela 06/27/2014  . Encounter for well adult exam with  abnormal findings 09/23/2011  . Impaired glucose tolerance 09/23/2011  . Hyperlipidemia 08/14/2010  . Extrinsic asthma 09/08/2009  . ALLERGIC RHINITIS 12/31/2007  . OBESITY 07/18/2007  . GERD 07/18/2007  . DEGENERATIVE JOINT DISEASE 07/18/2007    Debbe Odea, PT,DPT 11/01/2018, 9:57 AM PHYSICAL THERAPY DISCHARGE SUMMARY  Visits from Start of Care: 12  Current functional level related to goals / functional outcomes: See above   Remaining deficits: See above  Education / Equipment: HEP Plan: Patient agrees to discharge.  Patient goals were partially met. Patient is being discharged due to not returning since the last visit.  ?????     Elsie Ra, PT, DPT 02/14/19 2:16 PM  [facility] [address] [place], [place], [address] Phone: [phone]   Fax:  [phone]  Name: [name] MRN: [id number] Date of Birth: Jun 07, 1951

## 2018-11-02 NOTE — Telephone Encounter (Signed)
Patient states that he got 20-25% relief from his TF ESI one week ago. Ok to try to get prior authorization from insurance for L5 TF?

## 2018-11-02 NOTE — Telephone Encounter (Signed)
Patient states that he got 20-25% relief from his TF ESI one week ago---is it ok to repeat injection?

## 2018-11-05 NOTE — Telephone Encounter (Signed)
Can repeat the ESI at 2 weeks following the first. jen

## 2018-11-08 ENCOUNTER — Other Ambulatory Visit (INDEPENDENT_AMBULATORY_CARE_PROVIDER_SITE_OTHER): Payer: Self-pay | Admitting: Specialist

## 2018-11-08 ENCOUNTER — Ambulatory Visit (INDEPENDENT_AMBULATORY_CARE_PROVIDER_SITE_OTHER): Payer: Self-pay

## 2018-11-08 ENCOUNTER — Other Ambulatory Visit (INDEPENDENT_AMBULATORY_CARE_PROVIDER_SITE_OTHER): Payer: Self-pay

## 2018-11-08 ENCOUNTER — Telehealth (INDEPENDENT_AMBULATORY_CARE_PROVIDER_SITE_OTHER): Payer: Self-pay | Admitting: Physical Medicine and Rehabilitation

## 2018-11-08 ENCOUNTER — Encounter (INDEPENDENT_AMBULATORY_CARE_PROVIDER_SITE_OTHER): Payer: Self-pay | Admitting: Specialist

## 2018-11-08 ENCOUNTER — Ambulatory Visit (INDEPENDENT_AMBULATORY_CARE_PROVIDER_SITE_OTHER): Payer: Managed Care, Other (non HMO) | Admitting: Specialist

## 2018-11-08 VITALS — BP 143/85 | HR 82 | Ht 72.0 in | Wt 221.0 lb

## 2018-11-08 DIAGNOSIS — M48062 Spinal stenosis, lumbar region with neurogenic claudication: Secondary | ICD-10-CM | POA: Diagnosis not present

## 2018-11-08 DIAGNOSIS — M4316 Spondylolisthesis, lumbar region: Secondary | ICD-10-CM | POA: Diagnosis not present

## 2018-11-08 DIAGNOSIS — M542 Cervicalgia: Secondary | ICD-10-CM

## 2018-11-08 DIAGNOSIS — M47812 Spondylosis without myelopathy or radiculopathy, cervical region: Secondary | ICD-10-CM

## 2018-11-08 MED ORDER — BACLOFEN 20 MG PO TABS: ORAL_TABLET | ORAL | 1 refills | Status: DC
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
## 2018-11-08 NOTE — Telephone Encounter (Signed)
Service [phone]:[id number] Auth Effective Date:12/09/2019Auth End Date:03/08/2020Initiated Date:12/09/2019Decision Date:12/09/2019Decision Type :InitialCase Status:Approved

## 2018-11-08 NOTE — Telephone Encounter (Signed)
Ok for injection  

## 2018-11-08 NOTE — Patient Instructions (Addendum)
Avoid bending, stooping and avoid lifting weights greater than 10 lbs. Avoid prolong standing and walking. Avoid frequent bending and stooping  No lifting greater than 10 lbs. May use ice or moist heat for pain. Weight loss is of benefit. Handicap license is approved. Dr. Newton's secretary/Assistant will call to arrange for epidural steroid injection  Avoid overhead lifting and overhead use of the arms. Do not lift greater than 5 lbs. Adjust head rest in vehicle to prevent hyperextension if rear ended. Take extra precautions to avoid falling.  .  

## 2018-11-09 NOTE — Telephone Encounter (Signed)
Gabapentin refill request.  Can you please advise since Dr. Otelia SergeantNitka is off this week?

## 2018-11-17 NOTE — Progress Notes (Signed)
[name] - 67 y.o. male MRN [id number]  Date of birth: 1951/01/11  Office Visit Note: Visit Date: 10/26/2018 PCP: Corwin Levins, MD Referred by: Corwin Levins, MD  Subjective: Chief Complaint  Patient presents with  . Lower Back - Pain  . Right Leg - Pain   HPI:  ARVON SCHREINER is a 67 y.o. male who comes in today For planned right L4 transforaminal epidural steroid injection as requested by Dr. Vira Browns.  Patient has had prior injections with intermittent relief in the past.  He is failed all manner of conservative care and medication management.  He does work I believe is a [employer] and he does a lot of physical activity and every time he feels better and goes back to work he reports this seems to hurt once again.  He is getting back pain referring into the right leg.  MRI reviewed again below and is most significant finding is at L4-5 with facet arthropathy glottic bands with listhesis and lateral recess narrowing.  He does have other multiple small disc herniations.  ROS Otherwise per HPI.  Assessment & Plan: Visit Diagnoses:  1. Lumbar radiculopathy     Plan: No additional findings.   Meds & Orders:  Meds ordered this encounter  Medications  . betamethasone acetate-betamethasone sodium phosphate (CELESTONE) injection 12 mg    Orders Placed This Encounter  Procedures  . XR C-ARM NO REPORT  . Epidural Steroid injection    Follow-up: Return if symptoms worsen or fail to improve.   Procedures: No procedures performed  Lumbosacral Transforaminal Epidural Steroid Injection - Sub-Pedicular Approach with Fluoroscopic Guidance  Patient: OSHAE SIMMERING      Date of Birth: Apr 20, 1951 MRN: [id number] PCP: Corwin Levins, MD      Visit Date: 10/26/2018   Universal Protocol:    Date/Time: 10/26/2018  Consent Given By: the patient  Position: PRONE  Additional Comments: Vital signs were monitored before and after the procedure. Patient was prepped and draped  in the usual sterile fashion. The correct patient, procedure, and site was verified.   Injection Procedure Details:  Procedure Site One Meds Administered:  Meds ordered this encounter  Medications  . betamethasone acetate-betamethasone sodium phosphate (CELESTONE) injection 12 mg    Laterality: Right  Location/Site:  L4-L5  Needle size: 22 G  Needle type: Spinal  Needle Placement: Transforaminal  Findings:    -Comments: Excellent flow of contrast along the nerve and into the epidural space.  Procedure Details: After squaring off the end-plates to get a true AP view, the C-arm was positioned so that an oblique view of the foramen as noted above was visualized. The target area is just inferior to the "nose of the scotty dog" or sub pedicular. The soft tissues overlying this structure were infiltrated with 2-3 ml. of 1% Lidocaine without Epinephrine.  The spinal needle was inserted toward the target using a "trajectory" view along the fluoroscope beam.  Under AP and lateral visualization, the needle was advanced so it did not puncture dura and was located close the 6 O'Clock position of the pedical in AP tracterory. Biplanar projections were used to confirm position. Aspiration was confirmed to be negative for CSF and/or blood. A 1-2 ml. volume of Isovue-250 was injected and flow of contrast was noted at each level. Radiographs were obtained for documentation purposes.   After attaining the desired flow of contrast documented above, a 0.5 to 1.0 ml test dose of 0.25% Marcaine  was injected into each respective transforaminal space.  The patient was observed for 90 seconds post injection.  After no sensory deficits were reported, and normal lower extremity motor function was noted,   the above injectate was administered so that equal amounts of the injectate were placed at each foramen (level) into the transforaminal epidural space.   Additional Comments:  The patient tolerated the  procedure well Dressing: Band-Aid    Post-procedure details: Patient was observed during the procedure. Post-procedure instructions were reviewed.  Patient left the clinic in stable condition.     Clinical History: MRI LUMBAR SPINE WITHOUT CONTRAST  TECHNIQUE: Multiplanar, multisequence MR imaging of the lumbar spine was performed. No intravenous contrast was administered.  COMPARISON:  03/17/2011  FINDINGS: Segmentation:  5 lumbar type vertebral bodies.  Alignment: No curvature. 4 mm anterolisthesis at L4-5. 2 mm anterolisthesis at L2-3.  Vertebrae: No focal bone lesion. Degenerative endplate changes at L5-S1.  Conus medullaris: Extends to the L1 level and appears normal.  Paraspinal and other soft tissues: No significant finding.  Disc levels:  T12-L1:  Normal.  L1-2: Desiccation and bulging of the disc with a shallow left posterior lateral disc herniation that indents the thecal sac. Narrowing of the left lateral recess could cause neural compression. This has worsened since the previous study.  L2-3: Bilateral facet degeneration and hypertrophy. 2 mm of anterolisthesis. Mild bulging of the disc. Very shallow left-sided protrusion superimposed. Mild narrowing of the lateral recesses without visible neural compression. Similar when compared to the previous study.  L3-4: Mild bulging of the disc. Mild facet and ligamentous hypertrophy. Mild lateral recess narrowing without visible neural compression.  L4-5: Advanced bilateral facet arthropathy with 4 mm of anterolisthesis. Circumferential bulging of the disc. Stenosis of both lateral recesses that could cause neural compression on either or both sides. Moderate foraminal narrowing right more than left. Facet edema could be associated with pain. Spinous process abutment could be associated with pain. Findings have worsened considerably since the previous study at this level.  L5-S1: Chronic  disc degeneration with desiccation and loss of height. Chronic endplate cystic changes. Mild bulging of the disc. Mild facet degeneration. Mild narrowing of the subarticular lateral recesses without visible neural compression. Mild foraminal narrowing without visible neural compression. Degenerative changes at this level have worsened somewhat since previous study.  IMPRESSION: The dominant finding is worsening of degenerative changes at the L4-5 level. There is advanced facet arthropathy with hypertrophic degenerative change allowing anterolisthesis of 4 mm. Facet joints are edematous and could be painful. The disc bulges circumferentially. There is spinal stenosis that could cause neural compression in either or both lateral recesses or neural foramina. This appearance could worsen with standing or flexion.  L2-3 facet arthropathy with 2 mm of anterolisthesis. Bulging of the disc and a shallow left posterior lateral disc protrusion. Mild stenosis of both lateral recesses.  L1-2 shallow disc herniation in the left posterior lateral direction with narrowing of left lateral recess that could be symptomatic. This has worsened since the previous study.  Worsened disc degeneration at L5-S1 with endplate changes. Mild facet degeneration. No visible neural compression at this level, but the findings could contribute to low back pain.   Electronically Signed   By: Paulina FusiMark  Shogry M.D.   On: 05/26/2016 19:26     Objective:  VS:  HT:    WT:   BMI:     BP:128/90  HR:82bpm  TEMP:98.1 F (36.7 C)(Oral)  RESP:  Physical Exam  Ortho  Exam Imaging: No results found.

## 2018-11-17 NOTE — Procedures (Signed)
Lumbosacral Transforaminal Epidural Steroid Injection - Sub-Pedicular Approach with Fluoroscopic Guidance  Patient: Joseph FarberRobert M [name]      Date of Birth: Jul 11, 1951 MRN: [id number] PCP: Corwin LevinsJohn, [name], MD      Visit Date: 10/26/2018   Universal Protocol:    Date/Time: 10/26/2018  Consent Given By: the patient  Position: PRONE  Additional Comments: Vital signs were monitored before and after the procedure. Patient was prepped and draped in the usual sterile fashion. The correct patient, procedure, and site was verified.   Injection Procedure Details:  Procedure Site One Meds Administered:  Meds ordered this encounter  Medications  . betamethasone acetate-betamethasone sodium phosphate (CELESTONE) injection 12 mg    Laterality: Right  Location/Site:  L4-L5  Needle size: 22 G  Needle type: Spinal  Needle Placement: Transforaminal  Findings:    -Comments: Excellent flow of contrast along the nerve and into the epidural space.  Procedure Details: After squaring off the end-plates to get a true AP view, the C-arm was positioned so that an oblique view of the foramen as noted above was visualized. The target area is just inferior to the "nose of the scotty dog" or sub pedicular. The soft tissues overlying this structure were infiltrated with 2-3 ml. of 1% Lidocaine without Epinephrine.  The spinal needle was inserted toward the target using a "trajectory" view along the fluoroscope beam.  Under AP and lateral visualization, the needle was advanced so it did not puncture dura and was located close the 6 O'Clock position of the pedical in AP tracterory. Biplanar projections were used to confirm position. Aspiration was confirmed to be negative for CSF and/or blood. A 1-2 ml. volume of Isovue-250 was injected and flow of contrast was noted at each level. Radiographs were obtained for documentation purposes.   After attaining the desired flow of contrast documented above, a 0.5 to  1.0 ml test dose of 0.25% Marcaine was injected into each respective transforaminal space.  The patient was observed for 90 seconds post injection.  After no sensory deficits were reported, and normal lower extremity motor function was noted,   the above injectate was administered so that equal amounts of the injectate were placed at each foramen (level) into the transforaminal epidural space.   Additional Comments:  The patient tolerated the procedure well Dressing: Band-Aid    Post-procedure details: Patient was observed during the procedure. Post-procedure instructions were reviewed.  Patient left the clinic in stable condition.

## 2018-11-25 ENCOUNTER — Telehealth (INDEPENDENT_AMBULATORY_CARE_PROVIDER_SITE_OTHER): Payer: Self-pay | Admitting: Radiology

## 2018-11-25 ENCOUNTER — Encounter (INDEPENDENT_AMBULATORY_CARE_PROVIDER_SITE_OTHER): Payer: Self-pay | Admitting: Radiology

## 2018-11-25 ENCOUNTER — Ambulatory Visit (INDEPENDENT_AMBULATORY_CARE_PROVIDER_SITE_OTHER): Payer: Managed Care, Other (non HMO) | Admitting: Specialist

## 2018-11-25 NOTE — Telephone Encounter (Signed)
Note has been done and given to patient.  °

## 2018-11-25 NOTE — Telephone Encounter (Signed)
Patient called, stated he spoke with Dr Otelia SergeantNitka yesterday and that Dr Otelia SergeantNitka told him it was ok for him to return to work regular duty tomorrow.  He needs a work note today, he will pick it up around 9am.

## 2018-11-29 ENCOUNTER — Encounter

## 2018-11-29 ENCOUNTER — Encounter (INDEPENDENT_AMBULATORY_CARE_PROVIDER_SITE_OTHER): Payer: Self-pay | Admitting: Physical Medicine and Rehabilitation

## 2018-11-29 ENCOUNTER — Ambulatory Visit (INDEPENDENT_AMBULATORY_CARE_PROVIDER_SITE_OTHER): Payer: Self-pay

## 2018-11-29 ENCOUNTER — Ambulatory Visit (INDEPENDENT_AMBULATORY_CARE_PROVIDER_SITE_OTHER): Payer: Managed Care, Other (non HMO) | Admitting: Physical Medicine and Rehabilitation

## 2018-11-29 VITALS — BP 134/74 | HR 80 | Temp 98.3°F

## 2018-11-29 DIAGNOSIS — M5416 Radiculopathy, lumbar region: Secondary | ICD-10-CM | POA: Diagnosis not present

## 2018-11-29 DIAGNOSIS — M961 Postlaminectomy syndrome, not elsewhere classified: Secondary | ICD-10-CM

## 2018-11-29 MED ORDER — BETAMETHASONE SOD PHOS & ACET 6 (3-3) MG/ML IJ SUSP
12.0000 mg | Freq: Once | INTRAMUSCULAR | Status: AC
  Administered 2018-11-29: 12 mg

## 2018-11-29 NOTE — Progress Notes (Signed)
 .  Numeric Pain Rating Scale and Functional Assessment Average Pain 7   In the last MONTH (on 0-10 scale) has pain interfered with the following?  1. General activity like being  able to carry out your everyday physical activities such as walking, climbing stairs, carrying groceries, or moving a chair?  Rating(4)   +Driver, -BT, -Dye Allergies.  

## 2018-11-29 NOTE — Patient Instructions (Signed)

## 2018-11-30 NOTE — Progress Notes (Signed)
Joseph FarberRobert M [name] - 67 y.o. male MRN [id number]  Date of birth: 1951/10/28  Office Visit Note: Visit Date: 11/29/2018 PCP: Corwin LevinsJohn, [name], MD Referred by: Corwin LevinsJohn, [name], MD  Subjective: Chief Complaint  Patient presents with  . Lower Back - Pain  . Right Leg - Pain  . Left Leg - Pain   HPI:  [name] is a 67 y.o. male who comes in today At the request of Dr. Vira BrownsJames Nitka for repeat right L4 transforaminal epidural steroid injection.  Patient is gotten some relief overall with prior epidural injections.  Trying to get him some relief after prior back surgery.  He has pain in the back and right more than left lower legs.  Failed conservative care.  ROS Otherwise per HPI.  Assessment & Plan: Visit Diagnoses:  1. Lumbar radiculopathy   2. Post laminectomy syndrome     Plan: No additional findings.   Meds & Orders:  Meds ordered this encounter  Medications  . betamethasone acetate-betamethasone sodium phosphate (CELESTONE) injection 12 mg    Orders Placed This Encounter  Procedures  . XR C-ARM NO REPORT  . Epidural Steroid injection    Follow-up: Return if symptoms worsen or fail to improve.   Procedures: No procedures performed  Lumbosacral Transforaminal Epidural Steroid Injection - Sub-Pedicular Approach with Fluoroscopic Guidance  Patient: [name]      Date of Birth: 1951/10/28 MRN: [id number] PCP: Corwin LevinsJohn, [name], MD      Visit Date: 11/29/2018   Universal Protocol:    Date/Time: 11/29/2018  Consent Given By: the patient  Position: PRONE  Additional Comments: Vital signs were monitored before and after the procedure. Patient was prepped and draped in the usual sterile fashion. The correct patient, procedure, and site was verified.   Injection Procedure Details:  Procedure Site One Meds Administered:  Meds ordered this encounter  Medications  . betamethasone acetate-betamethasone sodium phosphate (CELESTONE) injection 12 mg    Laterality:  Right  Location/Site:  L4-L5  Needle size: 22 G  Needle type: Spinal  Needle Placement: Transforaminal  Findings:    -Comments: Excellent flow of contrast along the nerve and into the epidural space.  Procedure Details: After squaring off the end-plates to get a true AP view, the C-arm was positioned so that an oblique view of the foramen as noted above was visualized. The target area is just inferior to the "nose of the scotty dog" or sub pedicular. The soft tissues overlying this structure were infiltrated with 2-3 ml. of 1% Lidocaine without Epinephrine.  The spinal needle was inserted toward the target using a "trajectory" view along the fluoroscope beam.  Under AP and lateral visualization, the needle was advanced so it did not puncture dura and was located close the 6 O'Clock position of the pedical in AP tracterory. Biplanar projections were used to confirm position. Aspiration was confirmed to be negative for CSF and/or blood. A 1-2 ml. volume of Isovue-250 was injected and flow of contrast was noted at each level. Radiographs were obtained for documentation purposes.   After attaining the desired flow of contrast documented above, a 0.5 to 1.0 ml test dose of 0.25% Marcaine was injected into each respective transforaminal space.  The patient was observed for 90 seconds post injection.  After no sensory deficits were reported, and normal lower extremity motor function was noted,   the above injectate was administered so that equal amounts of the injectate were placed at each foramen (level)  into the transforaminal epidural space.   Additional Comments:  The patient tolerated the procedure well Dressing: Band-Aid    Post-procedure details: Patient was observed during the procedure. Post-procedure instructions were reviewed.  Patient left the clinic in stable condition.    Clinical History: MRI LUMBAR SPINE WITHOUT CONTRAST  TECHNIQUE: Multiplanar, multisequence MR  imaging of the lumbar spine was performed. No intravenous contrast was administered.  COMPARISON:  03/17/2011  FINDINGS: Segmentation:  5 lumbar type vertebral bodies.  Alignment: No curvature. 4 mm anterolisthesis at L4-5. 2 mm anterolisthesis at L2-3.  Vertebrae: No focal bone lesion. Degenerative endplate changes at L5-S1.  Conus medullaris: Extends to the L1 level and appears normal.  Paraspinal and other soft tissues: No significant finding.  Disc levels:  T12-L1:  Normal.  L1-2: Desiccation and bulging of the disc with a shallow left posterior lateral disc herniation that indents the thecal sac. Narrowing of the left lateral recess could cause neural compression. This has worsened since the previous study.  L2-3: Bilateral facet degeneration and hypertrophy. 2 mm of anterolisthesis. Mild bulging of the disc. Very shallow left-sided protrusion superimposed. Mild narrowing of the lateral recesses without visible neural compression. Similar when compared to the previous study.  L3-4: Mild bulging of the disc. Mild facet and ligamentous hypertrophy. Mild lateral recess narrowing without visible neural compression.  L4-5: Advanced bilateral facet arthropathy with 4 mm of anterolisthesis. Circumferential bulging of the disc. Stenosis of both lateral recesses that could cause neural compression on either or both sides. Moderate foraminal narrowing right more than left. Facet edema could be associated with pain. Spinous process abutment could be associated with pain. Findings have worsened considerably since the previous study at this level.  L5-S1: Chronic disc degeneration with desiccation and loss of height. Chronic endplate cystic changes. Mild bulging of the disc. Mild facet degeneration. Mild narrowing of the subarticular lateral recesses without visible neural compression. Mild foraminal narrowing without visible neural compression. Degenerative  changes at this level have worsened somewhat since previous study.  IMPRESSION: The dominant finding is worsening of degenerative changes at the L4-5 level. There is advanced facet arthropathy with hypertrophic degenerative change allowing anterolisthesis of 4 mm. Facet joints are edematous and could be painful. The disc bulges circumferentially. There is spinal stenosis that could cause neural compression in either or both lateral recesses or neural foramina. This appearance could worsen with standing or flexion.  L2-3 facet arthropathy with 2 mm of anterolisthesis. Bulging of the disc and a shallow left posterior lateral disc protrusion. Mild stenosis of both lateral recesses.  L1-2 shallow disc herniation in the left posterior lateral direction with narrowing of left lateral recess that could be symptomatic. This has worsened since the previous study.  Worsened disc degeneration at L5-S1 with endplate changes. Mild facet degeneration. No visible neural compression at this level, but the findings could contribute to low back pain.   Electronically Signed   By: Paulina FusiMark  Shogry M.D.   On: 05/26/2016 19:26     Objective:  VS:  HT:    WT:   BMI:     BP:134/74  HR:80bpm  TEMP:98.3 F (36.8 C)(Oral)  RESP:  Physical Exam  Ortho Exam Imaging: Xr C-arm No Report  Result Date: 11/29/2018 Please see Notes tab for imaging impression.

## 2018-11-30 NOTE — Procedures (Signed)
Lumbosacral Transforaminal Epidural Steroid Injection - Sub-Pedicular Approach with Fluoroscopic Guidance  Patient: Joseph FarberRobert M [name]      Date of Birth: 1951-09-08 MRN: [id number] PCP: Corwin LevinsJohn, [name], MD      Visit Date: 11/29/2018   Universal Protocol:    Date/Time: 11/29/2018  Consent Given By: the patient  Position: PRONE  Additional Comments: Vital signs were monitored before and after the procedure. Patient was prepped and draped in the usual sterile fashion. The correct patient, procedure, and site was verified.   Injection Procedure Details:  Procedure Site One Meds Administered:  Meds ordered this encounter  Medications  . betamethasone acetate-betamethasone sodium phosphate (CELESTONE) injection 12 mg    Laterality: Right  Location/Site:  L4-L5  Needle size: 22 G  Needle type: Spinal  Needle Placement: Transforaminal  Findings:    -Comments: Excellent flow of contrast along the nerve and into the epidural space.  Procedure Details: After squaring off the end-plates to get a true AP view, the C-arm was positioned so that an oblique view of the foramen as noted above was visualized. The target area is just inferior to the "nose of the scotty dog" or sub pedicular. The soft tissues overlying this structure were infiltrated with 2-3 ml. of 1% Lidocaine without Epinephrine.  The spinal needle was inserted toward the target using a "trajectory" view along the fluoroscope beam.  Under AP and lateral visualization, the needle was advanced so it did not puncture dura and was located close the 6 O'Clock position of the pedical in AP tracterory. Biplanar projections were used to confirm position. Aspiration was confirmed to be negative for CSF and/or blood. A 1-2 ml. volume of Isovue-250 was injected and flow of contrast was noted at each level. Radiographs were obtained for documentation purposes.   After attaining the desired flow of contrast documented above, a 0.5 to  1.0 ml test dose of 0.25% Marcaine was injected into each respective transforaminal space.  The patient was observed for 90 seconds post injection.  After no sensory deficits were reported, and normal lower extremity motor function was noted,   the above injectate was administered so that equal amounts of the injectate were placed at each foramen (level) into the transforaminal epidural space.   Additional Comments:  The patient tolerated the procedure well Dressing: Band-Aid    Post-procedure details: Patient was observed during the procedure. Post-procedure instructions were reviewed.  Patient left the clinic in stable condition.

## 2018-12-03 ENCOUNTER — Other Ambulatory Visit (INDEPENDENT_AMBULATORY_CARE_PROVIDER_SITE_OTHER): Payer: Self-pay | Admitting: Family Medicine

## 2018-12-08 ENCOUNTER — Ambulatory Visit (INDEPENDENT_AMBULATORY_CARE_PROVIDER_SITE_OTHER): Payer: Managed Care, Other (non HMO) | Admitting: Specialist

## 2019-01-19 ENCOUNTER — Other Ambulatory Visit: Payer: Self-pay | Admitting: Internal Medicine

## 2019-01-20 NOTE — Telephone Encounter (Signed)
Patient called to ask why his script for his Losartan was refused.  Patient does not know why he could not get a refill.  Please advise and call patient to explain at [phone]

## 2019-01-20 NOTE — Telephone Encounter (Signed)
B/c it was stopped at discharge I think in late august 2019; would need OV to consider restarting

## 2019-01-20 NOTE — Telephone Encounter (Signed)
This med isn't even listed on his med list. Please advise. Should the pt be on this medication?

## 2019-01-21 NOTE — Telephone Encounter (Signed)
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 He stated that his BP was going good after the surgery and from dropping 20lbs but has now returned with elevated readings. He stated that he would not schedule an OV to discuss since he still had some Losartan that he was taking. He stated he just needed a refill and that all of this is unnecessary. I informed the pt that I would tell PCP but as of right now an OV would be needed to discuss and he expressed understanding.

## 2019-02-10 ENCOUNTER — Other Ambulatory Visit: Payer: Self-pay

## 2019-02-10 ENCOUNTER — Ambulatory Visit: Payer: BLUE CROSS/BLUE SHIELD | Admitting: Internal Medicine

## 2019-02-10 ENCOUNTER — Telehealth: Payer: Self-pay | Admitting: Internal Medicine

## 2019-02-10 ENCOUNTER — Encounter: Payer: Self-pay | Admitting: Internal Medicine

## 2019-02-10 ENCOUNTER — Other Ambulatory Visit (INDEPENDENT_AMBULATORY_CARE_PROVIDER_SITE_OTHER): Payer: BLUE CROSS/BLUE SHIELD

## 2019-02-10 VITALS — BP 124/86 | HR 85 | Temp 98.1°F | Ht 72.0 in | Wt 237.0 lb

## 2019-02-10 DIAGNOSIS — Z0000 Encounter for general adult medical examination without abnormal findings: Secondary | ICD-10-CM

## 2019-02-10 DIAGNOSIS — R7302 Impaired glucose tolerance (oral): Secondary | ICD-10-CM | POA: Diagnosis not present

## 2019-02-10 DIAGNOSIS — Z125 Encounter for screening for malignant neoplasm of prostate: Secondary | ICD-10-CM

## 2019-02-10 DIAGNOSIS — I10 Essential (primary) hypertension: Secondary | ICD-10-CM

## 2019-02-10 LAB — BASIC METABOLIC PANEL
BUN: 20 mg/dL (ref 6–23)
CALCIUM: 9.3 mg/dL (ref 8.4–10.5)
CO2: 26 mEq/L (ref 19–32)
Chloride: 108 mEq/L (ref 96–112)
Creatinine, Ser: 1.23 mg/dL (ref 0.40–1.50)
GFR: 58.86 mL/min — AB (ref >60.00)
Glucose, Bld: 122 mg/dL — ABNORMAL HIGH (ref 70–99)
Potassium: 4.1 mEq/L (ref 3.5–5.1)
Sodium: 141 mEq/L (ref 135–145)

## 2019-02-10 LAB — URINALYSIS, ROUTINE W REFLEX MICROSCOPIC
Bilirubin Urine: NEGATIVE
Hgb urine dipstick: NEGATIVE
Ketones, ur: NEGATIVE
Leukocytes,Ua: NEGATIVE
Nitrite: NEGATIVE
RBC / HPF: NONE SEEN (ref 0–?)
Specific Gravity, Urine: >=1.03 — AB (ref 1.000–1.030)
Total Protein, Urine: NEGATIVE
Urine Glucose: NEGATIVE
Urobilinogen, UA: 0.2 (ref 0.0–1.0)
pH: 5.5 (ref 5.0–8.0)

## 2019-02-10 LAB — CBC WITH DIFFERENTIAL/PLATELET
BASOS PCT: 0.9 % (ref 0.0–3.0)
Basophils Absolute: 0.1 10*3/uL (ref 0.0–0.1)
Eosinophils Absolute: 0.2 10*3/uL (ref 0.0–0.7)
Eosinophils Relative: 2.7 % (ref 0.0–5.0)
HCT: 41.3 % (ref 39.0–52.0)
Hemoglobin: 13.6 g/dL (ref 13.0–17.0)
Lymphocytes Relative: 30.1 % (ref 12.0–46.0)
Lymphs Abs: 1.9 10*3/uL (ref 0.7–4.0)
MCHC: 32.9 g/dL (ref 30.0–36.0)
MCV: 89.2 fl (ref 78.0–100.0)
Monocytes Absolute: 0.6 10*3/uL (ref 0.1–1.0)
Monocytes Relative: 9.6 % (ref 3.0–12.0)
Neutro Abs: 3.5 10*3/uL (ref 1.4–7.7)
Neutrophils Relative %: 56.7 % (ref 43.0–77.0)
Platelets: 209 10*3/uL (ref 150.0–400.0)
RBC: 4.62 Mil/uL (ref 4.22–5.81)
RDW: 15.5 % (ref 11.5–15.5)
WBC: 6.2 10*3/uL (ref 4.0–10.5)

## 2019-02-10 LAB — TSH: TSH: 0.86 u[IU]/mL (ref 0.35–4.50)

## 2019-02-10 LAB — HEPATIC FUNCTION PANEL
ALT: 18 U/L (ref 0–53)
AST: 19 U/L (ref 0–37)
Albumin: 4.3 g/dL (ref 3.5–5.2)
Alkaline Phosphatase: 88 U/L (ref 39–117)
Bilirubin, Direct: 0.1 mg/dL (ref 0.0–0.3)
Total Bilirubin: 0.7 mg/dL (ref 0.2–1.2)
Total Protein: 6.5 g/dL (ref 6.0–8.3)

## 2019-02-10 LAB — LIPID PANEL
Cholesterol: 162 mg/dL (ref 0–200)
HDL: 49.3 mg/dL (ref >39.00)
LDL Cholesterol: 90 mg/dL (ref 0–99)
NONHDL: 112.97
Total CHOL/HDL Ratio: 3
Triglycerides: 113 mg/dL (ref 0.0–149.0)
VLDL: 22.6 mg/dL (ref 0.0–40.0)

## 2019-02-10 LAB — HEMOGLOBIN A1C: HEMOGLOBIN A1C: 5.6 % (ref 4.6–6.5)

## 2019-02-10 LAB — PSA: PSA: 0.81 ng/mL (ref 0.10–4.00)

## 2019-02-10 MED ORDER — LOSARTAN POTASSIUM 100 MG PO TABS: 100.0000 mg | ORAL_TABLET | Freq: Every day | ORAL | 3 refills | Status: DC

## 2019-02-10 NOTE — Telephone Encounter (Signed)
Copied from CRM [id number]. Topic: Quick Communication - See Telephone Encounter >> Feb 10, 2019  5:02 PM Aretta Nip wrote: CRM for notification. See Telephone encounter for: 02/10/19.Pt just called and just left appt with Dr Jonny Ruiz, he was supposed to wait for him to come back they were discussing something and the nurse came in and he forgot and left. He would not give a clue what it pertained to and would nnot leave a meessage to route thru the nurse.  Wants Dr Jonny Ruiz to call him at [phone]

## 2019-02-10 NOTE — Assessment & Plan Note (Signed)
Ok for increased losartan 100 qd, f/u BP at home and next visit

## 2019-02-10 NOTE — Assessment & Plan Note (Signed)

## 2019-02-10 NOTE — Patient Instructions (Addendum)
OK to increase the losartan to 100 mg per day  OK to restart the coated Aspirin 81 mg per day  Please continue all other medications as before, and refills have been done if requested.  Please have the pharmacy call with any other refills you may need.  Please continue your efforts at being more active, low cholesterol diet, and weight control.  You are otherwise up to date with prevention measures today.  Please keep your appointments with your specialists as you may have planned  Please go to the LAB in the Basement (turn left off the elevator) for the tests to be done today  You will be contacted by phone if any changes need to be made immediately.  Otherwise, you will receive a letter about your results with an explanation, but please check with MyChart first.  Please remember to sign up for MyChart if you have not done so, as this will be important to you in the future with finding out test results, communicating by private email, and scheduling acute appointments online when needed.  Please return in 6 months, or sooner if needed

## 2019-02-10 NOTE — Assessment & Plan Note (Signed)
stable overall by history and exam, recent data reviewed with pt, and pt to continue medical treatment as before,  to f/u any worsening symptoms or concerns  

## 2019-02-10 NOTE — Progress Notes (Signed)
Subjective:    Patient ID: [name], male    DOB: [birth year], 68 y.o.   MRN: [id number]  HPI  Here for wellness and f/u;  Overall doing ok;  Pt denies Chest pain, worsening SOB, DOE, wheezing, orthopnea, PND, worsening LE edema, palpitations, dizziness or syncope.  Pt denies neurological change such as new headache, facial or extremity weakness.  Pt denies polydipsia, polyuria, or low sugar symptoms. Pt states overall good compliance with treatment and medications, good tolerability, and has been trying to follow appropriate diet.  Pt denies worsening depressive symptoms, suicidal ideation or panic. No fever, night sweats, wt loss, loss of appetite, or other constitutional symptoms.  Pt states good ability with ADL's, has low fall risk, home safety reviewed and adequate, no other significant changes in hearing or vision, and only occasionally active with exercise. Now off asa but wiling to restart  Pt continues to have recurring LBP without change in severity, bowel or bladder change, fever, wt loss,  worsening LE pain/numbness/weakness, gait change or falls, may need surgury eventually for right sided pain, and taking naproxn, muscle relaxer, gabepentin for pain. BP was better recenlty after right knee surgury, but now BP more elevated today.  Asks for increased losartan Past Medical History:  Diagnosis Date  . ALLERGIC RHINITIS 12/31/2007  . COUGH, CHRONIC 07/18/2007  . DEGENERATIVE JOINT DISEASE 07/18/2007  . Extrinsic asthma, unspecified 09/08/2009  . GERD 07/18/2007  . HYPERLIPIDEMIA 08/14/2010  . Hypertension   . Impaired glucose tolerance 09/23/2011  . OBESITY 07/18/2007   Past Surgical History:  Procedure Laterality Date  . BACK SURGERY    . DG FEMUR LEFT  ([facility] HX)     rod placed  . TOOTH EXTRACTION    . TOTAL KNEE ARTHROPLASTY Right 07/26/2018   Procedure: RIGHT TOTAL KNEE ARTHROPLASTY;  Surgeon: Kerrin Champagne, MD;  Location: [facility] OR;  Service: Orthopedics;  Laterality: Right;    reports that he has quit smoking. He has never used smokeless tobacco. He reports current alcohol use. He reports that he does not use drugs. family history is not on file. No Known Allergies Current Outpatient Medications on File Prior to Visit  Medication Sig Dispense Refill  . albuterol (PROVENTIL HFA;VENTOLIN HFA) 108 (90 Base) MCG/ACT inhaler Inhale 2 puffs into the lungs every 6 (six) hours as needed for wheezing or shortness of breath. 3 Inhaler 3  . baclofen (LIORESAL) 10 MG tablet TAKE 1 TABLET(10 MG) BY MOUTH THREE TIMES DAILY AS NEEDED FOR MUSCLE SPASMS 90 tablet 1  . baclofen (LIORESAL) 20 MG tablet 1 po tid prn muscle spasms 90 each 1  . budesonide-formoterol (SYMBICORT) 160-4.5 MCG/ACT inhaler Inhale 2 puffs into the lungs 2 (two) times daily. (Patient taking differently: Inhale 1 puff into the lungs every evening. ) 3 Inhaler 3  . docusate sodium (COLACE) 100 MG capsule Take 1 capsule (100 mg total) by mouth 2 (two) times daily. 40 capsule 0  . esomeprazole (NEXIUM) 40 MG capsule Take 1 capsule (40 mg total) by mouth daily before breakfast. (Patient taking differently: Take 40 mg by mouth every three (3) days as needed (heartburn). ) 90 capsule 2  . gabapentin (NEURONTIN) 300 MG capsule Take 1 capsule (300 mg total) by mouth 2 (two) times daily. 60 capsule 2  . gabapentin (NEURONTIN) 300 MG capsule TAKE 1 CAPSULE TWICE A DAY 180 capsule 4  . naproxen (NAPROSYN) 500 MG tablet Take 500 mg by mouth daily.     . tadalafil (  CIALIS) 20 MG tablet Take 1 tablet (20 mg total) by mouth daily as needed for erectile dysfunction. 10 tablet 11   No current facility-administered medications on file prior to visit.    Review of Systems Constitutional: Negative for other unusual diaphoresis, sweats, appetite or weight changes HENT: Negative for other worsening hearing loss, ear pain, facial swelling, mouth sores or neck stiffness.   Eyes: Negative for other worsening pain, redness or other visual  disturbance.  Respiratory: Negative for other stridor or swelling Cardiovascular: Negative for other palpitations or other chest pain  Gastrointestinal: Negative for worsening diarrhea or loose stools, blood in stool, distention or other pain Genitourinary: Negative for hematuria, flank pain or other change in urine volume.  Musculoskeletal: Negative for myalgias or other joint swelling.  Skin: Negative for other color change, or other wound or worsening drainage.  Neurological: Negative for other syncope or numbness. Hematological: Negative for other adenopathy or swelling Psychiatric/Behavioral: Negative for hallucinations, other worsening agitation, SI, self-injury, or new decreased concentration All other system neg per pt    Objective:   Physical Exam BP 124/86   Pulse 85   Temp 98.1 F (36.7 C) (Oral)   Ht 6' (1.829 m)   Wt 237 lb (107.5 kg)   SpO2 94%   BMI 32.14 kg/m  VS noted,  Constitutional: Pt is oriented to person, place, and time. Appears well-developed and well-nourished, in no significant distress and comfortable Head: Normocephalic and atraumatic  Eyes: Conjunctivae and EOM are normal. Pupils are equal, round, and reactive to light Right Ear: External ear normal without discharge Left Ear: External ear normal without discharge Nose: Nose without discharge or deformity Mouth/Throat: Oropharynx is without other ulcerations and moist  Neck: Normal range of motion. Neck supple. No JVD present. No tracheal deviation present or significant neck LA or mass Cardiovascular: Normal rate, regular rhythm, normal heart sounds and intact distal pulses.   Pulmonary/Chest: WOB normal and breath sounds without rales or wheezing  Abdominal: Soft. Bowel sounds are normal. NT. No HSM  Musculoskeletal: Normal range of motion. Exhibits no edema Lymphadenopathy: Has no other cervical adenopathy.  Neurological: Pt is alert and oriented to person, place, and time. Pt has normal reflexes.  No cranial nerve deficit. Motor grossly intact, Gait intact Skin: Skin is warm and dry. No rash noted or new ulcerations Psychiatric:  Has normal mood and affect. Behavior is normal without agitation No other exam findings  Lab Results  Component Value Date   WBC 9.1 07/29/2018   HGB 10.5 (L) 07/29/2018   HCT 33.7 (L) 07/29/2018   PLT 162 07/29/2018   GLUCOSE 142 (H) 07/27/2018   CHOL 172 01/07/2018   TRIG 86.0 01/07/2018   HDL 45.70 01/07/2018   LDLDIRECT 132.9 09/23/2011   LDLCALC 109 (H) 01/07/2018   ALT 27 07/16/2018   AST 27 07/16/2018   NA 137 07/27/2018   K 4.3 07/27/2018   CL 107 07/27/2018   CREATININE 1.24 07/27/2018   BUN 17 07/27/2018   CO2 24 07/27/2018   TSH 0.75 01/07/2018   PSA 0.93 01/07/2018   INR 0.96 07/16/2018   HGBA1C 5.6 01/07/2018       Assessment & Plan:

## 2019-02-14 NOTE — Telephone Encounter (Signed)
Called pt, LVM to call back to discuss. 

## 2019-06-02 ENCOUNTER — Other Ambulatory Visit: Payer: Self-pay | Admitting: Radiology

## 2019-06-03 MED ORDER — BACLOFEN 10 MG PO TABS: 10.0000 mg | ORAL_TABLET | Freq: Three times a day (TID) | ORAL | 3 refills | Status: DC

## 2019-07-05 ENCOUNTER — Ambulatory Visit (INDEPENDENT_AMBULATORY_CARE_PROVIDER_SITE_OTHER): Payer: BLUE CROSS/BLUE SHIELD | Admitting: Internal Medicine

## 2019-07-05 ENCOUNTER — Encounter: Payer: Self-pay | Admitting: Internal Medicine

## 2019-07-05 ENCOUNTER — Other Ambulatory Visit: Payer: Self-pay

## 2019-07-05 DIAGNOSIS — R7302 Impaired glucose tolerance (oral): Secondary | ICD-10-CM | POA: Diagnosis not present

## 2019-07-05 DIAGNOSIS — J4520 Mild intermittent asthma, uncomplicated: Secondary | ICD-10-CM

## 2019-07-05 DIAGNOSIS — Z20828 Contact with and (suspected) exposure to other viral communicable diseases: Secondary | ICD-10-CM | POA: Diagnosis not present

## 2019-07-05 DIAGNOSIS — B349 Viral infection, unspecified: Secondary | ICD-10-CM | POA: Diagnosis not present

## 2019-07-05 DIAGNOSIS — Z20822 Contact with and (suspected) exposure to covid-19: Secondary | ICD-10-CM

## 2019-07-05 NOTE — Patient Instructions (Signed)
Please go to the [facility] testing site for COVID testing  Please continue all other medications as before, and refills have been done if requested.  Please have the pharmacy call with any other refills you may need.  Please continue your efforts at being more active, low cholesterol diet, and weight control.  Please keep your appointments with your specialists as you may have planned

## 2019-07-05 NOTE — Assessment & Plan Note (Signed)
stable overall by history and exam, recent data reviewed with pt, and pt to continue medical treatment as before,  to f/u any worsening symptoms or concerns  

## 2019-07-05 NOTE — Assessment & Plan Note (Signed)
With mild to mod symptoms, needs to r/o COVID - will refer for testing, o/w supportive care

## 2019-07-05 NOTE — Progress Notes (Signed)
Patient ID: Joseph FarberRobert M [name], male   DOB: Apr 10, 1951, 68 y.o.   MRN: [id number]  Virtual Visit via Video Note  I connected with Joseph Farberobert M [name] on 07/05/19 at 11:20 AM EDT by a video enabled telemedicine application and verified that I am speaking with the correct person using two identifiers.  Location: Patient:at home Provider: at office   I discussed the limitations of evaluation and management by telemedicine and the availability of in person appointments. The patient expressed understanding and agreed to proceed.  History of Present Illness: Here with 3 days onset upper respiratory congestion only partly helped with mucinex and dayquil, nyquil;  ? Low grade temp, has occasional cough. Pt denies chest pain, increased sob or doe, wheezing, orthopnea, PND, increased LE swelling, palpitations, dizziness or syncope.  Does c/o ongoing fatigue, but denies signficant daytime hypersomnolence.  Has mild nausea yesterday for a short time, none today.  Pt denies new neurological symptoms such as new headache, or facial or extremity weakness or numbness   Pt denies polydipsia, polyuria, or low sugar symptoms such as weakness or confusion improved with po intake.  Pt states overall good compliance with meds Past Medical History:  Diagnosis Date  . ALLERGIC RHINITIS 12/31/2007  . COUGH, CHRONIC 07/18/2007  . DEGENERATIVE JOINT DISEASE 07/18/2007  . Extrinsic asthma, unspecified 09/08/2009  . GERD 07/18/2007  . HYPERLIPIDEMIA 08/14/2010  . Hypertension   . Impaired glucose tolerance 09/23/2011  . OBESITY 07/18/2007   Past Surgical History:  Procedure Laterality Date  . BACK SURGERY    . DG FEMUR LEFT  ([facility] HX)     rod placed  . TOOTH EXTRACTION    . TOTAL KNEE ARTHROPLASTY Right 07/26/2018   Procedure: RIGHT TOTAL KNEE ARTHROPLASTY;  Surgeon: Kerrin ChampagneNitka,  E, MD;  Location: [facility] OR;  Service: Orthopedics;  Laterality: Right;    reports that he has quit smoking. He has never used smokeless tobacco. He  reports current alcohol use. He reports that he does not use drugs. family history is not on file. No Known Allergies Current Outpatient Medications on File Prior to Visit  Medication Sig Dispense Refill  . albuterol (PROVENTIL HFA;VENTOLIN HFA) 108 (90 Base) MCG/ACT inhaler Inhale 2 puffs into the lungs every 6 (six) hours as needed for wheezing or shortness of breath. 3 Inhaler 3  . baclofen (LIORESAL) 10 MG tablet Take 1 tablet (10 mg total) by mouth 3 (three) times daily. 270 tablet 3  . baclofen (LIORESAL) 20 MG tablet 1 po tid prn muscle spasms 90 each 1  . budesonide-formoterol (SYMBICORT) 160-4.5 MCG/ACT inhaler Inhale 2 puffs into the lungs 2 (two) times daily. (Patient taking differently: Inhale 1 puff into the lungs every evening. ) 3 Inhaler 3  . docusate sodium (COLACE) 100 MG capsule Take 1 capsule (100 mg total) by mouth 2 (two) times daily. 40 capsule 0  . esomeprazole (NEXIUM) 40 MG capsule Take 1 capsule (40 mg total) by mouth daily before breakfast. (Patient taking differently: Take 40 mg by mouth every three (3) days as needed (heartburn). ) 90 capsule 2  . gabapentin (NEURONTIN) 300 MG capsule Take 1 capsule (300 mg total) by mouth 2 (two) times daily. 60 capsule 2  . gabapentin (NEURONTIN) 300 MG capsule TAKE 1 CAPSULE TWICE A DAY 180 capsule 4  . losartan (COZAAR) 100 MG tablet Take 1 tablet (100 mg total) by mouth daily. 90 tablet 3  . naproxen (NAPROSYN) 500 MG tablet Take 500 mg by mouth daily.     .Marland Kitchen  tadalafil (CIALIS) 20 MG tablet Take 1 tablet (20 mg total) by mouth daily as needed for erectile dysfunction. 10 tablet 11   No current facility-administered medications on file prior to visit.     Observations/Objective: Alert, NAD, mild ill appaering, appropriate mood and affect, resps normal, cn 2-12 intact, moves all 4s, no visible rash or swelling Lab Results  Component Value Date   WBC 6.2 02/10/2019   HGB 13.6 02/10/2019   HCT 41.3 02/10/2019   PLT 209.0  02/10/2019   GLUCOSE 122 (H) 02/10/2019   CHOL 162 02/10/2019   TRIG 113.0 02/10/2019   HDL 49.30 02/10/2019   LDLDIRECT 132.9 09/23/2011   LDLCALC 90 02/10/2019   ALT 18 02/10/2019   AST 19 02/10/2019   NA 141 02/10/2019   K 4.1 02/10/2019   CL 108 02/10/2019   CREATININE 1.23 02/10/2019   BUN 20 02/10/2019   CO2 26 02/10/2019   TSH 0.86 02/10/2019   PSA 0.81 02/10/2019   INR 0.96 07/16/2018   HGBA1C 5.6 02/10/2019   Assessment and Plan: See notes  Follow Up Instructions: See notes   I discussed the assessment and treatment plan with the patient. The patient was provided an opportunity to ask questions and all were answered. The patient agreed with the plan and demonstrated an understanding of the instructions.   The patient was advised to call back or seek an in-person evaluation if the symptoms worsen or if the condition fails to improve as anticipated   Cathlean Cower, MD

## 2019-07-11 ENCOUNTER — Ambulatory Visit: Payer: BLUE CROSS/BLUE SHIELD | Admitting: Specialist

## 2019-07-22 ENCOUNTER — Ambulatory Visit: Payer: BLUE CROSS/BLUE SHIELD | Admitting: Internal Medicine

## 2019-07-29 ENCOUNTER — Other Ambulatory Visit: Payer: Self-pay | Admitting: Specialist

## 2019-07-29 ENCOUNTER — Ambulatory Visit (INDEPENDENT_AMBULATORY_CARE_PROVIDER_SITE_OTHER): Payer: BC Managed Care – PPO | Admitting: Specialist

## 2019-07-29 ENCOUNTER — Encounter: Payer: Self-pay | Admitting: Specialist

## 2019-07-29 ENCOUNTER — Ambulatory Visit: Payer: Self-pay

## 2019-07-29 VITALS — BP 115/62 | HR 86 | Ht 73.0 in | Wt 232.0 lb

## 2019-07-29 DIAGNOSIS — M7021 Olecranon bursitis, right elbow: Secondary | ICD-10-CM | POA: Diagnosis not present

## 2019-07-29 DIAGNOSIS — M4316 Spondylolisthesis, lumbar region: Secondary | ICD-10-CM

## 2019-07-29 DIAGNOSIS — M71122 Other infective bursitis, left elbow: Secondary | ICD-10-CM | POA: Diagnosis not present

## 2019-07-29 DIAGNOSIS — M79672 Pain in left foot: Secondary | ICD-10-CM

## 2019-07-29 DIAGNOSIS — M19072 Primary osteoarthritis, left ankle and foot: Secondary | ICD-10-CM

## 2019-07-29 DIAGNOSIS — S51011A Laceration without foreign body of right elbow, initial encounter: Secondary | ICD-10-CM | POA: Diagnosis not present

## 2019-07-29 DIAGNOSIS — M5416 Radiculopathy, lumbar region: Secondary | ICD-10-CM

## 2019-07-29 DIAGNOSIS — M48061 Spinal stenosis, lumbar region without neurogenic claudication: Secondary | ICD-10-CM

## 2019-07-29 MED ORDER — CEPHALEXIN 500 MG PO CAPS: 500.0000 mg | ORAL_CAPSULE | Freq: Four times a day (QID) | ORAL | 1 refills | Status: DC

## 2019-07-29 MED ORDER — DICLOFENAC SODIUM 1 % TD GEL: 2.0000 g | Freq: Four times a day (QID) | TRANSDERMAL | 6 refills | Status: DC

## 2019-07-29 NOTE — Patient Instructions (Signed)
Report the right elbow injury to your employer and get it assessed and treated, the condition is an open olecranon bursa laceration with persistent drainage, nearly 58 weeks old. This has been cultured today and may need antibiotics and immobization to heal and potentially may require a Bursa excision and irrigation and drainage. You may need antibiotics to treat this. The left foot is mid foot osteoarthritis with loss of mid foot arch changes.   This is treated with immobilization, ice, antiarthrits medication and shoe modification with inserts. A stiff shoe is better than a very flexible shoe to decrease pain associated with moving the arthritis joints.  The lumbar condition is a Spondylolisthesis or slipped vertebrae, it is a grade 1-2 of 6 stages and likely results in narrowing of the spine and nerve pinch with numbness in the legs and feet. You are working and active and perform at a high level of activity. The risk of a surgery to fix this is substantial and it is important to know how severe the condition is. I recommend EMG and NCV of the legs to assess the degree of nerve changes already present. Surgery would prevent loss of function and prevent increasing numbness. Surgery would not guarantee that sensation would return as the nerves may be injured to where they may not recover. Improvement is usually seen but can not be guaranteed. Surgery is usually done to improve standing and walking tolerance.
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
## 2019-07-31 LAB — WOUND CULTURE
MICRO NUMBER:: 823395
SPECIMEN QUALITY:: ADEQUATE

## 2019-08-12 ENCOUNTER — Other Ambulatory Visit: Payer: Self-pay | Admitting: Specialist

## 2019-08-12 MED ORDER — CEPHALEXIN 500 MG PO CAPS: 500.0000 mg | ORAL_CAPSULE | Freq: Four times a day (QID) | ORAL | 1 refills | Status: DC

## 2019-08-16 ENCOUNTER — Encounter: Payer: Self-pay | Admitting: Physical Medicine and Rehabilitation

## 2019-08-16 ENCOUNTER — Other Ambulatory Visit: Payer: Self-pay

## 2019-08-16 ENCOUNTER — Ambulatory Visit (INDEPENDENT_AMBULATORY_CARE_PROVIDER_SITE_OTHER): Payer: BC Managed Care – PPO | Admitting: Physical Medicine and Rehabilitation

## 2019-08-16 DIAGNOSIS — R200 Anesthesia of skin: Secondary | ICD-10-CM

## 2019-08-16 DIAGNOSIS — R202 Paresthesia of skin: Secondary | ICD-10-CM

## 2019-08-16 NOTE — Progress Notes (Signed)
  Numeric Pain Rating Scale and Functional Assessment Average Pain 4   In the last MONTH (on 0-10 scale) has pain interfered with the following?  1. General activity like being  able to carry out your everyday physical activities such as walking, climbing stairs, carrying groceries, or moving a chair?  Rating(5)   

## 2019-08-18 DIAGNOSIS — D1801 Hemangioma of skin and subcutaneous tissue: Secondary | ICD-10-CM | POA: Diagnosis not present

## 2019-08-18 DIAGNOSIS — L821 Other seborrheic keratosis: Secondary | ICD-10-CM | POA: Diagnosis not present

## 2019-08-18 DIAGNOSIS — B079 Viral wart, unspecified: Secondary | ICD-10-CM | POA: Diagnosis not present

## 2019-08-18 DIAGNOSIS — D485 Neoplasm of uncertain behavior of skin: Secondary | ICD-10-CM | POA: Diagnosis not present

## 2019-08-18 DIAGNOSIS — D229 Melanocytic nevi, unspecified: Secondary | ICD-10-CM | POA: Diagnosis not present

## 2019-08-18 DIAGNOSIS — L814 Other melanin hyperpigmentation: Secondary | ICD-10-CM | POA: Diagnosis not present

## 2019-08-18 NOTE — Progress Notes (Signed)
Joseph FarberRobert M [name] - 68 y.o. male MRN [id number]  Date of birth: [birth year]  Office Visit Note: Visit Date: 08/16/2019 PCP: Corwin LevinsJohn, [name], MD Referred by: Corwin LevinsJohn, [name], MD  Subjective: Chief Complaint  Patient presents with  . Left Leg - Follow-up  . Right Leg - Follow-up   HPI:  [name] is a 68 y.o. male who comes in today HPI ROS Otherwise per HPI.  Assessment & Plan: Visit Diagnoses:  1. Paresthesia of skin     Plan: Impression: The above electrodiagnostic study is ABNORMAL and somewhat difficult to interpret but does reveal evidence of:  1.  A mild/moderate chronic L5 and S1 radiculopathy on the right and left.    2. An at least  moderate right fibular nerve neuropathy at or above the knee affecting sensory and motor components.   3.  A possible underlying sensory predominant axonal more than demyelinating polyneuropathy.  This is not diagnostic and difficult to ascertain.  Recommendations: 1.  Follow-up with referring physician. 2.  Continue current management of symptoms.  If felt to be more of a polyneuropathy then referral for consultation with neurology.   Meds & Orders: No orders of the defined types were placed in this encounter.   Orders Placed This Encounter  Procedures  . NCV with EMG (electromyography)    Follow-up: Return for Vira BrownsJames Nitka, MD.   Procedures: No procedures performed  EMG & NCV Findings: Evaluation of the left fibular motor, the left tibial motor, the right tibial motor, and the right superficial fibular sensory nerves showed reduced amplitude (L0.3, L2.1, R1.9, R0.7 V).  The right fibular motor nerve showed reduced amplitude (0.7 mV), decreased conduction velocity (B Fib-Ankle, 37 m/s), and decreased conduction velocity (Poplt-B Fib, 26 m/s).  The left saphenous sensory nerve showed no response (14cm).  The left superficial fibular sensory nerve showed no response (14 cm).  The left sural sensory nerve showed prolonged distal peak  latency (4.3 ms) and decreased conduction velocity (Calf-Lat Mall, 33 m/s).  The right sural sensory nerve showed prolonged distal peak latency (5.1 ms), reduced amplitude (3.7 V), and decreased conduction velocity (Calf-Lat Mall, 27 m/s).  All remaining nerves (as indicated in the following tables) were within normal limits.  All left vs. right side differences were within normal limits.    Needle evaluation of the left Fibularis Longus muscle showed increased insertional activity and slightly increased spontaneous activity.  The left medial gastrocnemius muscle showed increased insertional activity, moderately increased spontaneous activity, and diminished recruitment.  The right Fibularis Longus muscle showed increased insertional activity and diminished recruitment.  The right medial gastrocnemius muscle showed increased insertional activity.  All remaining muscles (as indicated in the following table) showed no evidence of electrical instability.    Impression: The above electrodiagnostic study is ABNORMAL and somewhat difficult to interpret but does reveal evidence of:  1.  A mild/moderate chronic L5 and S1 radiculopathy on the right and left.    2. An at least  moderate right fibular nerve neuropathy at or above the knee affecting sensory and motor components.   3.  A possible underlying sensory predominant axonal more than demyelinating polyneuropathy.  This is not diagnostic and difficult to ascertain.  Recommendations: 1.  Follow-up with referring physician. 2.  Continue current management of symptoms.  If felt to be more of a polyneuropathy then referral for consultation with neurology.  ___________________________ Elease HashimotoFred Oluwaseyi Tull FAAPMR Board Certified, American Board of Physical Medicine and Rehabilitation  Nerve Conduction Studies Anti Sensory Summary Table   Stim Site NR Peak (ms) Norm Peak (ms) P-T Amp (V) Norm P-T Amp Site1 Site2 Delta-P (ms) Dist (cm) Vel (m/s) Norm Vel  (m/s)  Left Saphenous Anti Sensory (Ant Med Mall)  30.5C  14cm *NR  <4.4  >2 14cm Ant Med Mall  0.0  >32  Right Saphenous Anti Sensory (Ant Med Mall)  27.6C  14cm    4.0 <4.4 4.2 >2 14cm Ant Med Mall 4.0 0.0  >32  Left Sup Fibular Anti Sensory (Ant Lat Mall)  30.5C  14 cm *NR  <4.4  >5.0 14 cm Ant Lat Mall  14.0  >32  Right Sup Fibular Anti Sensory (Ant Lat Mall)  27.8C  14 cm    4.3 <4.4 *0.7 >5.0 14 cm Ant Lat Mall 4.3 14.0 33 >32  Left Sural Anti Sensory (Lat Mall)  30.7C  Calf    *4.3 <4.0 8.2 >5.0 Calf Lat Mall 4.3 14.0 *33 >35  Right Sural Anti Sensory (Lat Mall)  27.4C  Calf    *5.1 <4.0 *3.7 >5.0 Calf Lat Mall 5.1 14.0 *27 >35   Motor Summary Table   Stim Site NR Onset (ms) Norm Onset (ms) O-P Amp (mV) Norm O-P Amp Site1 Site2 Delta-0 (ms) Dist (cm) Vel (m/s) Norm Vel (m/s)  Left Fibular Motor (Ext Dig Brev)  29.9C  Ankle    4.5 <6.1 *0.3 >2.5 B Fib Ankle 9.1 35.0 38 >38  B Fib    13.6  0.5  Poplt B Fib 2.3 10.0 43 >40  Poplt    15.9  0.5         Right Fibular Motor (Ext Dig Brev)  28.8C  Ankle    6.1 <6.1 *0.7 >2.5 B Fib Ankle 9.3 34.0 *37 >38  B Fib    15.4  0.4  Poplt B Fib 3.8 10.0 *26 >40  Poplt    19.2  0.4         Left Tibial Motor (Abd Hall Brev)  30.3C  Ankle    5.2 <6.1 *2.1 >3.0 Knee Ankle 9.4 37.0 39 >35  Knee    14.6  1.5         Right Tibial Motor (Abd Hall Brev)  28.4C  Ankle    5.8 <6.1 *1.9 >3.0 Knee Ankle 9.8 42.0 43 >35  Knee    15.6  1.4          EMG   Side Muscle Nerve Root Ins Act Fibs Psw Amp Dur Poly Recrt Int Dennie BiblePat Comment  Left AntTibialis Dp Br Peron L4-5 Nml Nml Nml Nml Nml 0 Nml Nml   Left Fibularis Longus  Sup Br Peron L5-S1 *Incr *1+ *1+ Nml Nml 0 Nml Nml   Left MedGastroc Tibial S1-2 *Incr *2+ *2+ Nml Nml 0 *Reduced Nml   Left VastusMed Femoral L2-4 Nml Nml Nml Nml Nml 0 Nml Nml   Left BicepsFemS Sciatic L5-S1 Nml Nml Nml Nml Nml 0 Nml Nml   Right AntTibialis Dp Br Peron L4-5 Nml Nml Nml Nml Nml 0 Nml Nml   Right Fibularis  Longus  Sup Br Peron L5-S1 *Incr Nml Nml Nml Nml 0 *Reduced Nml   Right MedGastroc Tibial S1-2 *Incr Nml Nml Nml Nml 0 Nml Nml   Right VastusMed Femoral L2-4 Nml Nml Nml Nml Nml 0 Nml Nml   Right BicepsFemS Sciatic L5-S1 Nml Nml Nml Nml Nml 0 Nml Nml     Nerve Conduction Studies Anti Sensory  Left/Right Comparison   Stim Site L Lat (ms) R Lat (ms) L-R Lat (ms) L Amp (V) R Amp (V) L-R Amp (%) Site1 Site2 L Vel (m/s) R Vel (m/s) L-R Vel (m/s)  Saphenous Anti Sensory (Ant Med Mall)  30.5C  14cm  4.0   4.2  14cm Ant Med Mall     Sup Fibular Anti Sensory (Ant Lat Mall)  30.5C  14 cm  4.3   *0.7  14 cm Ant Lat Mall  33   Sural Anti Sensory (Lat Mall)  30.7C  Calf *4.3 *5.1 0.8 8.2 *3.7 54.9 Calf Lat Mall *33 *27 6   Motor Left/Right Comparison   Stim Site L Lat (ms) R Lat (ms) L-R Lat (ms) L Amp (mV) R Amp (mV) L-R Amp (%) Site1 Site2 L Vel (m/s) R Vel (m/s) L-R Vel (m/s)  Fibular Motor (Ext Dig Brev)  29.9C  Ankle 4.5 6.1 1.6 *0.3 *0.7 57.1 B Fib Ankle 38 *37 1  B Fib 13.6 15.4 1.8 0.5 0.4 20.0 Poplt B Fib 43 *26 17  Poplt 15.9 19.2 3.3 0.5 0.4 20.0       Tibial Motor (Abd Hall Brev)  30.3C  Ankle 5.2 5.8 0.6 *2.1 *1.9 9.5 Knee Ankle 39 43 4  Knee 14.6 15.6 1.0 1.5 1.4 6.7          Waveforms:                     Clinical History: MRI LUMBAR SPINE WITHOUT CONTRAST  TECHNIQUE: Multiplanar, multisequence MR imaging of the lumbar spine was performed. No intravenous contrast was administered.  COMPARISON:  03/17/2011  FINDINGS: Segmentation:  5 lumbar type vertebral bodies.  Alignment: No curvature. 4 mm anterolisthesis at L4-5. 2 mm anterolisthesis at L2-3.  Vertebrae: No focal bone lesion. Degenerative endplate changes at D7-O2.  Conus medullaris: Extends to the L1 level and appears normal.  Paraspinal and other soft tissues: No significant finding.  Disc levels:  T12-L1:  Normal.  L1-2: Desiccation and bulging of the disc with a shallow left  posterior lateral disc herniation that indents the thecal sac. Narrowing of the left lateral recess could cause neural compression. This has worsened since the previous study.  L2-3: Bilateral facet degeneration and hypertrophy. 2 mm of anterolisthesis. Mild bulging of the disc. Very shallow left-sided protrusion superimposed. Mild narrowing of the lateral recesses without visible neural compression. Similar when compared to the previous study.  L3-4: Mild bulging of the disc. Mild facet and ligamentous hypertrophy. Mild lateral recess narrowing without visible neural compression.  L4-5: Advanced bilateral facet arthropathy with 4 mm of anterolisthesis. Circumferential bulging of the disc. Stenosis of both lateral recesses that could cause neural compression on either or both sides. Moderate foraminal narrowing right more than left. Facet edema could be associated with pain. Spinous process abutment could be associated with pain. Findings have worsened considerably since the previous study at this level.  L5-S1: Chronic disc degeneration with desiccation and loss of height. Chronic endplate cystic changes. Mild bulging of the disc. Mild facet degeneration. Mild narrowing of the subarticular lateral recesses without visible neural compression. Mild foraminal narrowing without visible neural compression. Degenerative changes at this level have worsened somewhat since previous study.  IMPRESSION: The dominant finding is worsening of degenerative changes at the L4-5 level. There is advanced facet arthropathy with hypertrophic degenerative change allowing anterolisthesis of 4 mm. Facet joints are edematous and could be painful. The disc bulges circumferentially. There is spinal  stenosis that could cause neural compression in either or both lateral recesses or neural foramina. This appearance could worsen with standing or flexion.  L2-3 facet arthropathy with 2 mm of  anterolisthesis. Bulging of the disc and a shallow left posterior lateral disc protrusion. Mild stenosis of both lateral recesses.  L1-2 shallow disc herniation in the left posterior lateral direction with narrowing of left lateral recess that could be symptomatic. This has worsened since the previous study.  Worsened disc degeneration at L5-S1 with endplate changes. Mild facet degeneration. No visible neural compression at this level, but the findings could contribute to low back pain.   Electronically Signed   By: Paulina Fusi M.D.   On: 05/26/2016 19:26     Objective:  VS:  HT:    WT:   BMI:     BP:   HR: bpm  TEMP: ( )  RESP:  Physical Exam Constitutional:      Appearance: Normal appearance. He is normal weight.  Musculoskeletal:     Right lower leg: No edema.     Left lower leg: No edema.     Comments: Brief exam of the bilateral posterior elbow region and skin shows no induration or swelling or drainage on the right with healing prior ulceration.  Examination of the lower extremity shows atrophy of the right anterior lateral calf compared to left.  There is some intrinsic atrophy of the EDB musculature bilaterally.  This can be normal with aging.  There is no allodynia or swelling or sweating etc. of the feet.  No discoloration.  No ulcers.  Patient has good strength in both lower extremities.  Well-healed surgical scar on right anterior knee.  Negative Tinel's over the fibular heads.  Does have decreased sensation more on the right and more of a fibular nerve distribution.  This could also be related to L5 nerve dermatome as well.  Skin:    General: Skin is warm and dry.  Neurological:     General: No focal deficit present.     Mental Status: He is alert and oriented to person, place, and time.     Sensory: Sensory deficit present.     Motor: Weakness present.     Coordination: Coordination normal.     Gait: Gait normal.  Psychiatric:        Mood and Affect:  Mood normal.        Behavior: Behavior normal.     Ortho Exam Imaging: No results found.

## 2019-08-18 NOTE — Procedures (Signed)
EMG & NCV Findings: Evaluation of the left fibular motor, the left tibial motor, the right tibial motor, and the right superficial fibular sensory nerves showed reduced amplitude (L0.3, L2.1, R1.9, R0.7 V).  The right fibular motor nerve showed reduced amplitude (0.7 mV), decreased conduction velocity (B Fib-Ankle, 37 m/s), and decreased conduction velocity (Poplt-B Fib, 26 m/s).  The left saphenous sensory nerve showed no response (14cm).  The left superficial fibular sensory nerve showed no response (14 cm).  The left sural sensory nerve showed prolonged distal peak latency (4.3 ms) and decreased conduction velocity (Calf-Lat Mall, 33 m/s).  The right sural sensory nerve showed prolonged distal peak latency (5.1 ms), reduced amplitude (3.7 V), and decreased conduction velocity (Calf-Lat Mall, 27 m/s).  All remaining nerves (as indicated in the following tables) were within normal limits.  All left vs. right side differences were within normal limits.    Needle evaluation of the left Fibularis Longus muscle showed increased insertional activity and slightly increased spontaneous activity.  The left medial gastrocnemius muscle showed increased insertional activity, moderately increased spontaneous activity, and diminished recruitment.  The right Fibularis Longus muscle showed increased insertional activity and diminished recruitment.  The right medial gastrocnemius muscle showed increased insertional activity.  All remaining muscles (as indicated in the following table) showed no evidence of electrical instability.    Impression: The above electrodiagnostic study is ABNORMAL and somewhat difficult to interpret but does reveal evidence of:  1.  A mild/moderate chronic L5 and S1 radiculopathy on the right and left.    2. An at least  moderate right fibular nerve neuropathy at or above the knee affecting sensory and motor components.   3.  A possible underlying sensory predominant axonal more than  demyelinating polyneuropathy.  This is not diagnostic and difficult to ascertain.  Recommendations: 1.  Follow-up with referring physician. 2.  Continue current management of symptoms.  If felt to be more of a polyneuropathy then referral for consultation with neurology.  ___________________________ Laurence Spates FAAPMR Board Certified, American Board of Physical Medicine and Rehabilitation    Nerve Conduction Studies Anti Sensory Summary Table   Stim Site NR Peak (ms) Norm Peak (ms) P-T Amp (V) Norm P-T Amp Site1 Site2 Delta-P (ms) Dist (cm) Vel (m/s) Norm Vel (m/s)  Left Saphenous Anti Sensory (Ant Med Mall)  30.5C  14cm *NR  <4.4  >2 14cm Ant Med Mall  0.0  >32  Right Saphenous Anti Sensory (Ant Med Mall)  27.6C  14cm    4.0 <4.4 4.2 >2 14cm Ant Med Mall 4.0 0.0  >32  Left Sup Fibular Anti Sensory (Ant Lat Mall)  30.5C  14 cm *NR  <4.4  >5.0 14 cm Ant Lat Mall  14.0  >32  Right Sup Fibular Anti Sensory (Ant Lat Mall)  27.8C  14 cm    4.3 <4.4 *0.7 >5.0 14 cm Ant Lat Mall 4.3 14.0 33 >32  Left Sural Anti Sensory (Lat Mall)  30.7C  Calf    *4.3 <4.0 8.2 >5.0 Calf Lat Mall 4.3 14.0 *33 >35  Right Sural Anti Sensory (Lat Mall)  27.4C  Calf    *5.1 <4.0 *3.7 >5.0 Calf Lat Mall 5.1 14.0 *27 >35   Motor Summary Table   Stim Site NR Onset (ms) Norm Onset (ms) O-P Amp (mV) Norm O-P Amp Site1 Site2 Delta-0 (ms) Dist (cm) Vel (m/s) Norm Vel (m/s)  Left Fibular Motor (Ext Dig Brev)  29.9C  Ankle    4.5 <6.1 *0.3 >  2.5 B Fib Ankle 9.1 35.0 38 >38  B Fib    13.6  0.5  Poplt B Fib 2.3 10.0 43 >40  Poplt    15.9  0.5         Right Fibular Motor (Ext Dig Brev)  28.8C  Ankle    6.1 <6.1 *0.7 >2.5 B Fib Ankle 9.3 34.0 *37 >38  B Fib    15.4  0.4  Poplt B Fib 3.8 10.0 *26 >40  Poplt    19.2  0.4         Left Tibial Motor (Abd Hall Brev)  30.3C  Ankle    5.2 <6.1 *2.1 >3.0 Knee Ankle 9.4 37.0 39 >35  Knee    14.6  1.5         Right Tibial Motor (Abd Hall Brev)  28.4C  Ankle    5.8  <6.1 *1.9 >3.0 Knee Ankle 9.8 42.0 43 >35  Knee    15.6  1.4          EMG   Side Muscle Nerve Root Ins Act Fibs Psw Amp Dur Poly Recrt Int Dennie BiblePat Comment  Left AntTibialis Dp Br Peron L4-5 Nml Nml Nml Nml Nml 0 Nml Nml   Left Fibularis Longus  Sup Br Peron L5-S1 *Incr *1+ *1+ Nml Nml 0 Nml Nml   Left MedGastroc Tibial S1-2 *Incr *2+ *2+ Nml Nml 0 *Reduced Nml   Left VastusMed Femoral L2-4 Nml Nml Nml Nml Nml 0 Nml Nml   Left BicepsFemS Sciatic L5-S1 Nml Nml Nml Nml Nml 0 Nml Nml   Right AntTibialis Dp Br Peron L4-5 Nml Nml Nml Nml Nml 0 Nml Nml   Right Fibularis Longus  Sup Br Peron L5-S1 *Incr Nml Nml Nml Nml 0 *Reduced Nml   Right MedGastroc Tibial S1-2 *Incr Nml Nml Nml Nml 0 Nml Nml   Right VastusMed Femoral L2-4 Nml Nml Nml Nml Nml 0 Nml Nml   Right BicepsFemS Sciatic L5-S1 Nml Nml Nml Nml Nml 0 Nml Nml     Nerve Conduction Studies Anti Sensory Left/Right Comparison   Stim Site L Lat (ms) R Lat (ms) L-R Lat (ms) L Amp (V) R Amp (V) L-R Amp (%) Site1 Site2 L Vel (m/s) R Vel (m/s) L-R Vel (m/s)  Saphenous Anti Sensory (Ant Med Mall)  30.5C  14cm  4.0   4.2  14cm Ant Med Mall     Sup Fibular Anti Sensory (Ant Lat Mall)  30.5C  14 cm  4.3   *0.7  14 cm Ant Lat Mall  33   Sural Anti Sensory (Lat Mall)  30.7C  Calf *4.3 *5.1 0.8 8.2 *3.7 54.9 Calf Lat Mall *33 *27 6   Motor Left/Right Comparison   Stim Site L Lat (ms) R Lat (ms) L-R Lat (ms) L Amp (mV) R Amp (mV) L-R Amp (%) Site1 Site2 L Vel (m/s) R Vel (m/s) L-R Vel (m/s)  Fibular Motor (Ext Dig Brev)  29.9C  Ankle 4.5 6.1 1.6 *0.3 *0.7 57.1 B Fib Ankle 38 *37 1  B Fib 13.6 15.4 1.8 0.5 0.4 20.0 Poplt B Fib 43 *26 17  Poplt 15.9 19.2 3.3 0.5 0.4 20.0       Tibial Motor (Abd Hall Brev)  30.3C  Ankle 5.2 5.8 0.6 *2.1 *1.9 9.5 Knee Ankle 39 43 4  Knee 14.6 15.6 1.0 1.5 1.4 6.7          Waveforms:

## 2019-08-29 ENCOUNTER — Ambulatory Visit: Payer: BC Managed Care – PPO | Admitting: Specialist

## 2019-09-12 IMAGING — DX DG CHEST 2V
2 series · 2 of 2 positions shown · non-contrast
Comparison: 12/05/2013

CLINICAL DATA: Chronic shortness of Breath

EXAM:
CHEST  2 VIEW

[chest pa]
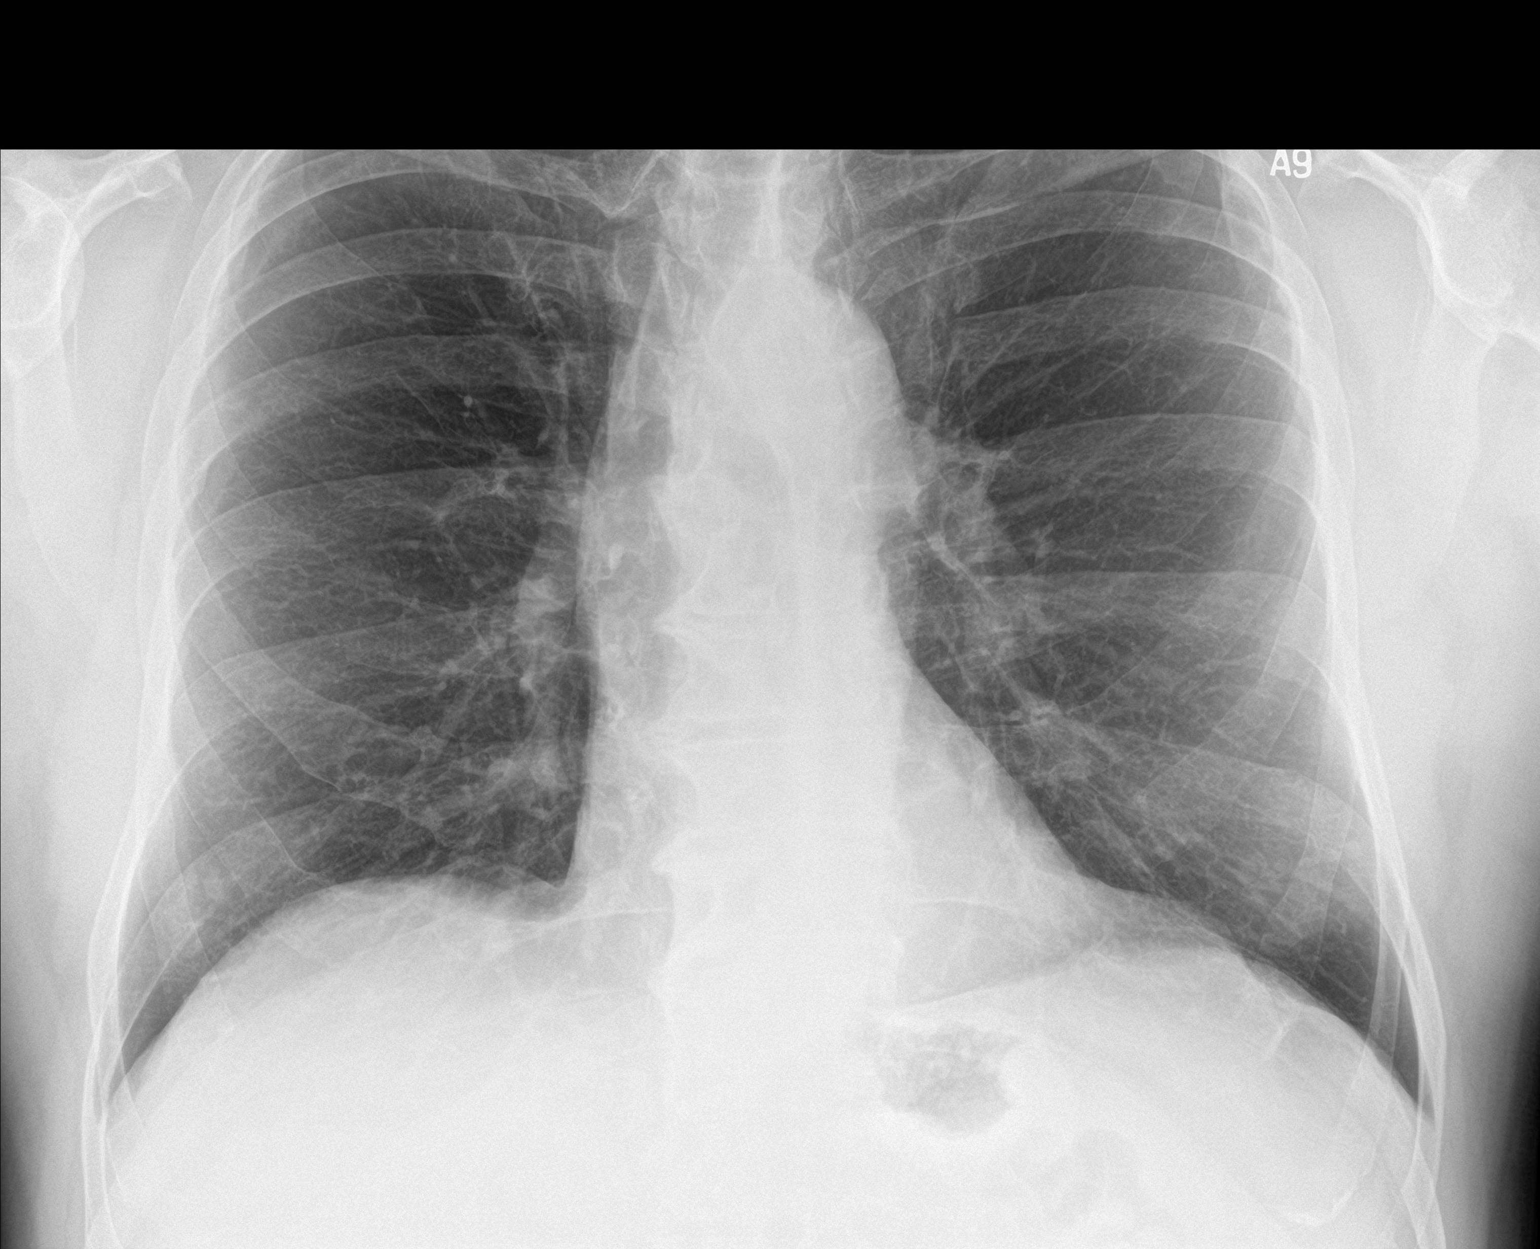

[chest lat]
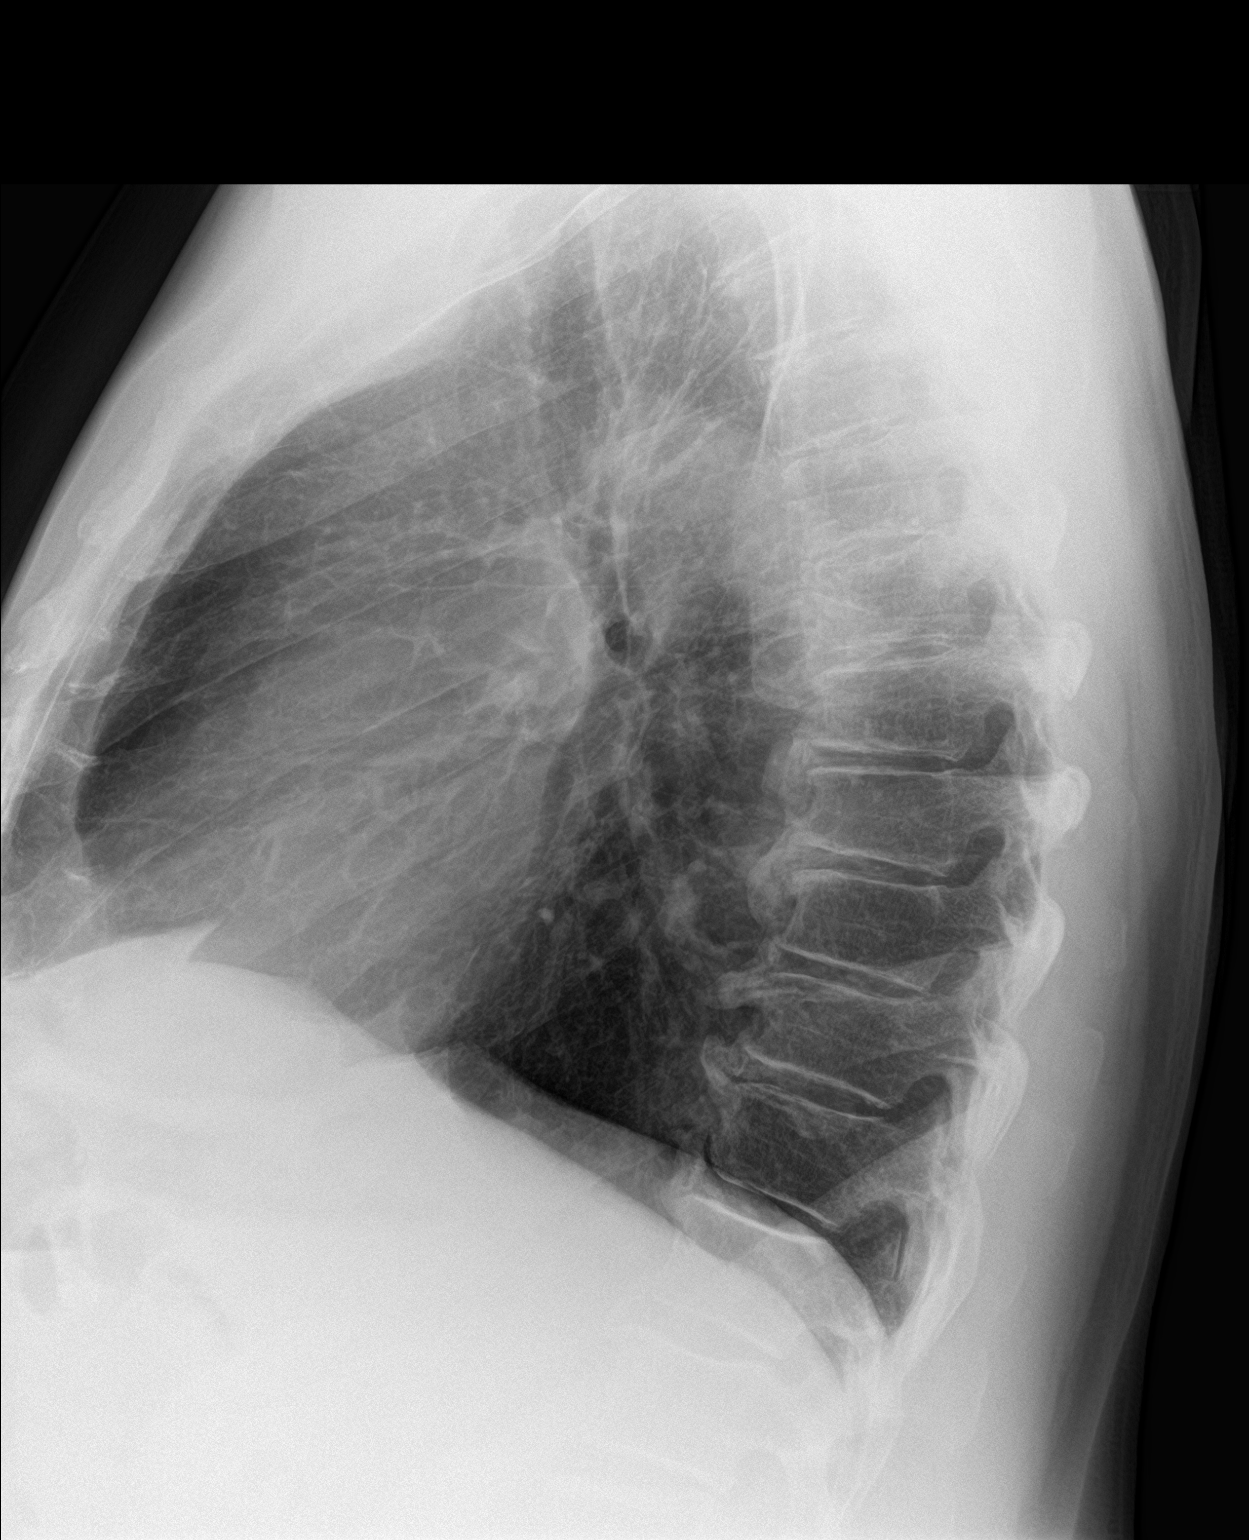

[2 of 2 positions shown; findings below may reference images not displayed]

FINDINGS: The heart size and mediastinal contours are within normal limits.
Both lungs are hyperinflated. The visualized skeletal structures
show degenerative changes of thoracic spine.
IMPRESSION: COPD without acute abnormality.

## 2019-09-22 ENCOUNTER — Encounter: Payer: Self-pay | Admitting: Specialist

## 2019-09-22 ENCOUNTER — Ambulatory Visit (INDEPENDENT_AMBULATORY_CARE_PROVIDER_SITE_OTHER): Payer: BC Managed Care – PPO | Admitting: Specialist

## 2019-09-22 ENCOUNTER — Other Ambulatory Visit: Payer: Self-pay

## 2019-09-22 VITALS — BP 133/86 | HR 73 | Ht 73.0 in | Wt 232.0 lb

## 2019-09-22 DIAGNOSIS — M48062 Spinal stenosis, lumbar region with neurogenic claudication: Secondary | ICD-10-CM | POA: Diagnosis not present

## 2019-09-22 DIAGNOSIS — M5416 Radiculopathy, lumbar region: Secondary | ICD-10-CM

## 2019-09-22 DIAGNOSIS — M4316 Spondylolisthesis, lumbar region: Secondary | ICD-10-CM

## 2019-09-22 MED ORDER — AMOXICILLIN-POT CLAVULANATE 875-125 MG PO TABS: ORAL_TABLET | ORAL | 0 refills | Status: DC

## 2019-09-22 NOTE — Progress Notes (Signed)
Office Visit Note   Patient: [name]           Date of Birth: 1951-01-30           MRN: [id number] Visit Date: 09/22/2019              Requested by: Corwin Levins, MD [address] [place],  [place] [address] PCP: Corwin Levins, MD   Assessment & Plan: Visit Diagnoses:  1. Spinal stenosis of lumbar region with neurogenic claudication   2. Spondylolisthesis, lumbar region   3. Radiculopathy, lumbar region     Plan: Avoid bending, stooping and avoid lifting weights greater than 10 lbs. Avoid prolong standing and walking. Avoid frequent bending and stooping  No lifting greater than 10 lbs. May use ice or moist heat for pain. Weight loss is of benefit. Handicap license is approved. Consider epidural cortisone injection if pain is constant and worsening. Follow-Up Instructions: No follow-ups on file.   Orders:  No orders of the defined types were placed in this encounter.  No orders of the defined types were placed in this encounter.     Procedures: No procedures performed   Clinical Data: No additional findings.   Subjective: Chief Complaint  Patient presents with  . Right Leg - Follow-up  . 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 The results of EMG/NCV shows there is both L5 and S1 changes bilaterally with some right knee peroneal n Changes and likely a polyneuropathy.   Review of Systems   Objective: Vital Signs: BP 133/86 (BP Location: Left Arm, Patient Position: Sitting)   Pulse 73   Ht 6\' 1"  (1.854 m)   Wt 232 lb (105.2 kg)   BMI 30.61 kg/m   Physical Exam  Ortho Exam  Specialty Comments:  No specialty comments available.  Imaging: No results found.   PMFS History: Patient Active Problem List   Diagnosis Date Noted  . Unilateral primary osteoarthritis, right knee 07/26/2018   Priority: High    Class: End Stage  . Anemia due to blood loss, acute 07/29/2018    Priority: Medium    Class: Acute  . Viral illness 07/05/2019  . DVT (deep venous thrombosis) (HCC) 07/28/2018  . S/P TKR (total knee replacement) using cement, right 07/26/2018  . Flu-like symptoms 01/23/2018  . Dyspnea 01/07/2018  . HTN (hypertension) 10/30/2016  . Cough 10/30/2016  . Asthma with exacerbation 10/30/2016  . Edema 11/22/2015  . Erectile dysfunction 01/17/2015  . Corneal abrasion, right, sequela 06/27/2014  . Wellness examination 09/23/2011  . Impaired glucose tolerance 09/23/2011  . Hyperlipidemia 08/14/2010  . Extrinsic asthma 09/08/2009  . ALLERGIC RHINITIS 12/31/2007  . OBESITY 07/18/2007  . GERD 07/18/2007  . DEGENERATIVE JOINT DISEASE 07/18/2007   Past Medical History:  Diagnosis Date  . ALLERGIC RHINITIS 12/31/2007  . COUGH, CHRONIC 07/18/2007  . DEGENERATIVE JOINT DISEASE 07/18/2007  . Extrinsic asthma, unspecified 09/08/2009  . GERD 07/18/2007  . HYPERLIPIDEMIA 08/14/2010  . Hypertension   . Impaired glucose tolerance 09/23/2011  . OBESITY 07/18/2007    No family history on file.  Past Surgical History:  Procedure Laterality Date  . BACK SURGERY    . DG FEMUR LEFT  ([facility] HX)     rod placed  . TOOTH EXTRACTION    . TOTAL KNEE ARTHROPLASTY Right 07/26/2018  Procedure: RIGHT TOTAL KNEE ARTHROPLASTY;  Surgeon: Jessy Oto, MD;  Location: [facility];  Service: Orthopedics;  Laterality: Right;   Social History   Occupational History  . Occupation: truck Geophysicist/field seismologist  Tobacco Use  . Smoking status: Former Research scientist (life sciences)  . Smokeless tobacco: Never Used  . Tobacco comment: Marijuana 30 years  Substance and Sexual Activity  . Alcohol use: Yes    Comment: occ  . Drug use: Never  . Sexual activity: Not on file

## 2019-09-22 NOTE — Patient Instructions (Signed)
Avoid bending, stooping and avoid lifting weights greater than 10 lbs. Avoid prolong standing and walking. Avoid frequent bending and stooping  No lifting greater than 10 lbs. May use ice or moist heat for pain. Weight loss is of benefit. Handicap license is approved.  

## 2019-09-30 ENCOUNTER — Other Ambulatory Visit: Payer: Self-pay | Admitting: Internal Medicine

## 2019-10-12 ENCOUNTER — Other Ambulatory Visit (INDEPENDENT_AMBULATORY_CARE_PROVIDER_SITE_OTHER): Payer: Self-pay | Admitting: Specialist

## 2019-10-21 ENCOUNTER — Telehealth: Payer: Self-pay | Admitting: Specialist

## 2019-10-21 NOTE — Telephone Encounter (Signed)
Patient called requesting an antibiotic for dental work on Tuesday 10/25/19. Had knee replacement in 2019.  Please call patient to advise. Must have for dental appt  [phone]

## 2019-10-24 ENCOUNTER — Other Ambulatory Visit: Payer: Self-pay | Admitting: Radiology

## 2019-10-24 MED ORDER — AMOXICILLIN-POT CLAVULANATE 875-125 MG PO TABS: ORAL_TABLET | ORAL | 0 refills | Status: DC

## 2019-10-24 NOTE — Telephone Encounter (Signed)
Rx of Augmentin has been faxed to [place] @ [facility] [address].  Patient is aware.

## 2020-01-18 ENCOUNTER — Other Ambulatory Visit: Payer: Self-pay | Admitting: Internal Medicine

## 2020-02-15 DIAGNOSIS — H2513 Age-related nuclear cataract, bilateral: Secondary | ICD-10-CM | POA: Diagnosis not present

## 2020-02-15 DIAGNOSIS — H40013 Open angle with borderline findings, low risk, bilateral: Secondary | ICD-10-CM | POA: Diagnosis not present

## 2020-03-13 ENCOUNTER — Other Ambulatory Visit: Payer: Self-pay | Admitting: Radiology

## 2020-03-13 ENCOUNTER — Encounter: Payer: Self-pay | Admitting: Internal Medicine

## 2020-03-13 ENCOUNTER — Encounter: Payer: Self-pay | Admitting: Specialist

## 2020-03-13 MED ORDER — GABAPENTIN 300 MG PO CAPS: 300.0000 mg | ORAL_CAPSULE | Freq: Two times a day (BID) | ORAL | 4 refills | Status: DC

## 2020-03-13 MED ORDER — BUDESONIDE-FORMOTEROL FUMARATE 160-4.5 MCG/ACT IN AERO: 1.0000 | INHALATION_SPRAY | Freq: Every evening | RESPIRATORY_TRACT | 3 refills | Status: DC

## 2020-03-24 ENCOUNTER — Encounter: Payer: Self-pay | Admitting: Internal Medicine

## 2020-03-26 NOTE — Telephone Encounter (Signed)
Staff to please call mail in pharmacy, as this may be a "PA" event

## 2020-03-28 ENCOUNTER — Telehealth: Payer: Self-pay

## 2020-03-28 NOTE — Telephone Encounter (Signed)
PA was sent today for symbicort Aer. Key: [id number]  Medication was sent to pharmacy 03/13/2020. However, a PA had to be done on medication due to coverage.

## 2020-04-02 NOTE — Telephone Encounter (Signed)
I have called [facility] and was able to get an approval for [name]'s Symbicort Rx. Case ID: [id number] Med Approved from: 03/03/2020-04/02/2023  Contacted pt and pharmacy to inform them of the information above.

## 2020-04-17 ENCOUNTER — Other Ambulatory Visit: Payer: Self-pay | Admitting: Internal Medicine

## 2020-04-17 NOTE — Telephone Encounter (Signed)
Please refill as per office routine med refill policy (all routine meds refilled for 3 mo or monthly per pt preference up to one year from last visit, then month to month grace period for 3 mo, then further med refills will have to be denied)  

## 2020-05-02 ENCOUNTER — Telehealth: Payer: Self-pay | Admitting: Specialist

## 2020-05-02 NOTE — Telephone Encounter (Signed)
Patient called.   Did not disclose why but he is requesting a call back from [facility].  Call back: [phone]

## 2020-05-02 NOTE — Telephone Encounter (Signed)
I called and he needs appt for trigger finger--I sched him for 06/15/20 @ 915

## 2020-05-25 ENCOUNTER — Telehealth: Payer: Self-pay

## 2020-05-25 MED ORDER — LOSARTAN POTASSIUM 100 MG PO TABS: 100.0000 mg | ORAL_TABLET | Freq: Every day | ORAL | 0 refills | Status: DC

## 2020-05-25 NOTE — Telephone Encounter (Signed)
1.Medication Requested:losartan (COZAAR) 100 MG tablet  2. Pharmacy (Name, Street, City):[facility] [phone] - [place], [place] - [address] AT [address]  3. On Med List: Yes   4. Last Visit with PCP: 8.4.20  5. Next visit date with PCP: 7.16.21    Agent: Please be advised that RX refills may take up to 3 business days. We ask that you follow-up with your pharmacy.

## 2020-06-05 ENCOUNTER — Other Ambulatory Visit: Payer: Self-pay

## 2020-06-05 ENCOUNTER — Ambulatory Visit: Payer: BC Managed Care – PPO | Admitting: Specialist

## 2020-06-05 ENCOUNTER — Encounter: Payer: Self-pay | Admitting: Specialist

## 2020-06-05 VITALS — BP 85/64 | Ht 73.0 in | Wt 236.0 lb

## 2020-06-05 DIAGNOSIS — M65331 Trigger finger, right middle finger: Secondary | ICD-10-CM

## 2020-06-05 MED ORDER — LIDOCAINE HCL (PF) 1 % IJ SOLN
1.0000 mL | INTRAMUSCULAR | Status: AC | PRN
  Administered 2020-06-05: 1 mL

## 2020-06-05 MED ORDER — METHYLPREDNISOLONE ACETATE 40 MG/ML IJ SUSP
20.0000 mg | INTRAMUSCULAR | Status: AC | PRN
  Administered 2020-06-05: 20 mg

## 2020-06-05 MED ORDER — BUPIVACAINE HCL 0.5 % IJ SOLN
1.0000 mL | INTRAMUSCULAR | Status: AC | PRN
  Administered 2020-06-05: 1 mL

## 2020-06-05 NOTE — Patient Instructions (Addendum)
Trigger Finger  Trigger finger, also called stenosing tenosynovitis,  is a condition that causes a finger to get stuck in a bent position. Each finger has a tendon, which is a tough, cord-like tissue that connects muscle to bone, and each tendon passes through a tunnel of tissue called a tendon sheath. To move your finger, your tendon needs to glide freely through the sheath. Trigger finger happens when the tendon or the sheath thickens, making it difficult to move your finger. Trigger finger can affect any finger or a thumb. It may affect more than one finger. Mild cases may clear up with rest and medicine. Severe cases require more treatment. What are the causes? Trigger finger is caused by a thickened finger tendon or tendon sheath. The cause of this thickening is not known. What increases the risk? The following factors may make you more likely to develop this condition:  Doing activities that require a strong grip.  Having rheumatoid arthritis, gout, or diabetes.  Being 90-71 years old.  Being male. What are the signs or symptoms? Symptoms of this condition include:  Pain when bending or straightening your finger.  Tenderness or swelling where your finger attaches to the palm of your hand.  A lump in the palm of your hand or on the inside of your finger.  Hearing a noise like a pop or a snap when you try to straighten your finger.  Feeling a catching or locking sensation when you try to straighten your finger.  Being unable to straighten your finger. How is this diagnosed? This condition is diagnosed based on your symptoms and a physical exam. How is this treated? This condition may be treated by:  Resting your finger and avoiding activities that make symptoms worse.  Wearing a finger splint to keep your finger extended.  Taking NSAIDs, such as ibuprofen, to relieve pain and swelling.  Doing gentle exercises to stretch the finger as told by your health care  provider.  Having medicine that reduces swelling and inflammation (steroids) injected into the tendon sheath. Injections may need to be repeated.  Having surgery to open the tendon sheath. This may be done if other treatments do not work and you cannot straighten your finger. You may need physical therapy after surgery. Follow these instructions at home: If you have a splint:  Wear the splint as told by your health care provider. Remove it only as told by your health care provider.  Loosen it if your fingers tingle, become numb, or turn cold and blue.  Keep it clean.  If the splint is not waterproof: ? Do not let it get wet. ? Cover it with a watertight covering when you take a bath or shower. Managing pain, stiffness, and swelling     If directed, apply heat to the affected area as often as told by your health care provider. Use the heat source that your health care provider recommends, such as a moist heat pack or a heating pad.  Place a towel between your skin and the heat source.  Leave the heat on for 20-30 minutes.  Remove the heat if your skin turns bright red. This is especially important if you are unable to feel pain, heat, or cold. You may have a greater risk of getting burned. If directed, put ice on the painful area. To do this:  If you have a removable splint, remove it as told by your health care provider.  Put ice in a plastic bag.  Place a  towel between your skin and the bag or between your splint and the bag.  Leave the ice on for 20 minutes, 2-3 times a day.  Activity  Rest your finger as told by your health care provider. Avoid activities that make the pain worse.  Return to your normal activities as told by your health care provider. Ask your health care provider what activities are safe for you.  Do exercises as told by your health care provider.  Ask your health care provider when it is safe to drive if you have a splint on your hand. General  instructions  Take over-the-counter and prescription medicines only as told by your health care provider.  Keep all follow-up visits as told by your health care provider. This is important. Contact a health care provider if:  Your symptoms are not improving with home care. Summary  Trigger finger, also called stenosing tenosynovitis, causes your finger to get stuck in a bent position. This can make it difficult and painful to straighten your finger.  This condition develops when a finger tendon or tendon sheath thickens.  Treatment may include resting your finger, wearing a splint, and taking medicines.  In severe cases, surgery to open the tendon sheath may be needed. This information is not intended to replace advice given to you by your health care provider. Make sure you discuss any questions you have with your health care provider.  Document Revised: 04/04/2019 Document Reviewed: 04/04/2019 Elsevier Patient Education  2020 [employer].  Trigger Point Injection Trigger points are areas where you have pain. A trigger point injection is a shot given in the trigger point to help relieve pain for a few days to a few months. Common places for trigger points include:  The neck.  The shoulders.  The upper back.  The lower back. A trigger point injection will not cure long-term (chronic) pain permanently. These injections do not always work for every person. For some people, they can help to relieve pain for a few days to a few months. Tell a health care provider about:  Any allergies you have.  All medicines you are taking, including vitamins, herbs, eye drops, creams, and over-the-counter medicines.  Any problems you or family members have had with anesthetic medicines.  Any blood disorders you have.  Any surgeries you have had.  Any medical conditions you have. What are the risks? Generally, this is a safe procedure. However, problems may occur,  including:  Infection.  Bleeding or bruising.  Allergic reaction to the injected medicine.  Irritation of the skin around the injection site. What happens before the procedure? Ask your health care provider about:  Changing or stopping your regular medicines. This is especially important if you are taking diabetes medicines or blood thinners.  Taking medicines such as aspirin and ibuprofen. These medicines can thin your blood. Do not take these medicines unless your health care provider tells you to take them.  Taking over-the-counter medicines, vitamins, herbs, and supplements. What happens during the procedure?   Your health care provider will feel for trigger points. A marker may be used to circle the area for the injection.  The skin over the trigger point will be washed with a germ-killing (antiseptic) solution.  A thin needle is used for the injection. You may feel pain or a twitching feeling when the needle enters the trigger point.  A numbing solution may be injected into the trigger point. Sometimes a medicine to keep down inflammation is also injected.  Your health care provider may move the needle around the area where the trigger point is located until the tightness and twitching goes away.  After the injection, your health care provider may put gentle pressure over the injection site.  The injection site will be covered with a bandage (dressing). The procedure may vary among health care providers and hospitals. What can I expect after treatment? After treatment, you may have:  Soreness and stiffness for 1-2 days.  A dressing. This can be taken off in a few hours or as told by your health care provider. Follow these instructions at home: Injection site care  Remove your dressing as told by your health care provider.  Check your injection site every day for signs of infection. Check for: ? Redness, swelling, or pain. ? Fluid or blood. ? Warmth. ? Pus or a  bad smell. Managing pain, stiffness, and swelling  If directed, put ice on the affected area. ? Put ice in a plastic bag. ? Place a towel between your skin and the bag. ? Leave the ice on for 20 minutes, 2-3 times a day. General instructions  If you were asked to stop your regular medicines, ask your health care provider when you may start taking them again.  Return to your normal activities as told by your health care provider. Ask your health care provider what activities are safe for you.  Do not take baths, swim, or use a hot tub until your health care provider approves.  You may be asked to see an occupational or physical therapist for exercises that reduce muscle strain and stretch the area of the trigger point.  Keep all follow-up visits as told by your health care provider. This is important. Contact a health care provider if:  Your pain comes back, and it is worse than before the injection. You may need more injections.  You have chills or a fever.  The injection site becomes more painful, red, swollen, or warm to the touch. Summary  A trigger point injection is a shot given in the trigger point to help relieve pain for a few days to a few months.  Common places for trigger point injections are the neck, shoulder, upper back, and lower back.  These injections do not always work for every person, but for some people, the injections can help to relieve pain for a few days to a few months.  Contact a health care provider if symptoms come back or they are worse than before treatment. Also, get help if the injection site becomes more painful, red, swollen, or warm to the touch. This information is not intended to replace advice given to you by your health care provider. Make sure you discuss any questions you have with your health care provider. Document Revised: 12/29/2018 Document Reviewed: 12/29/2018 Elsevier Patient Education  2020 [employer].

## 2020-06-05 NOTE — Progress Notes (Signed)
Office Visit Note   Patient: [name]           Date of Birth: 1951-10-11           MRN: [id number] Visit Date: 06/05/2020              Requested by: Corwin Levins, MD [address] [place],  [place] [address] PCP: Corwin Levins, MD   Assessment & Plan: Visit Diagnoses:  1. Trigger middle finger of right hand     Plan:  Trigger Finger  Trigger finger, also called stenosing tenosynovitis,  is a condition that causes a finger to get stuck in a bent position. Each finger has a tendon, which is a tough, cord-like tissue that connects muscle to bone, and each tendon passes through a tunnel of tissue called a tendon sheath. To move your finger, your tendon needs to glide freely through the sheath. Trigger finger happens when the tendon or the sheath thickens, making it difficult to move your finger. Trigger finger can affect any finger or a thumb. It may affect more than one finger. Mild cases may clear up with rest and medicine. Severe cases require more treatment. What are the causes? Trigger finger is caused by a thickened finger tendon or tendon sheath. The cause of this thickening is not known. What increases the risk? The following factors may make you more likely to develop this condition:  Doing activities that require a strong grip.  Having rheumatoid arthritis, gout, or diabetes.  Being 67-67 years old.  Being male. What are the signs or symptoms? Symptoms of this condition include:  Pain when bending or straightening your finger.  Tenderness or swelling where your finger attaches to the palm of your hand.  A lump in the palm of your hand or on the inside of your finger.  Hearing a noise like a pop or a snap when you try to straighten your finger.  Feeling a catching or locking sensation when you try to straighten your finger.  Being unable to straighten your finger. How is this diagnosed? This condition is diagnosed based on your symptoms and a  physical exam. How is this treated? This condition may be treated by:  Resting your finger and avoiding activities that make symptoms worse.  Wearing a finger splint to keep your finger extended.  Taking NSAIDs, such as ibuprofen, to relieve pain and swelling.  Doing gentle exercises to stretch the finger as told by your health care provider.  Having medicine that reduces swelling and inflammation (steroids) injected into the tendon sheath. Injections may need to be repeated.  Having surgery to open the tendon sheath. This may be done if other treatments do not work and you cannot straighten your finger. You may need physical therapy after surgery. Follow these instructions at home: If you have a splint:  Wear the splint as told by your health care provider. Remove it only as told by your health care provider.  Loosen it if your fingers tingle, become numb, or turn cold and blue.  Keep it clean.  If the splint is not waterproof: ? Do not let it get wet. ? Cover it with a watertight covering when you take a bath or shower. Managing pain, stiffness, and swelling     If directed, apply heat to the affected area as often as told by your health care provider. Use the heat source that your health care provider recommends, such as a moist heat pack or  a heating pad.  Place a towel between your skin and the heat source.  Leave the heat on for 20-30 minutes.  Remove the heat if your skin turns bright red. This is especially important if you are unable to feel pain, heat, or cold. You may have a greater risk of getting burned. If directed, put ice on the painful area. To do this:  If you have a removable splint, remove it as told by your health care provider.  Put ice in a plastic bag.  Place a towel between your skin and the bag or between your splint and the bag.  Leave the ice on for 20 minutes, 2-3 times a day.  Activity  Rest your finger as told by your health care  provider. Avoid activities that make the pain worse.  Return to your normal activities as told by your health care provider. Ask your health care provider what activities are safe for you.  Do exercises as told by your health care provider.  Ask your health care provider when it is safe to drive if you have a splint on your hand. General instructions  Take over-the-counter and prescription medicines only as told by your health care provider.  Keep all follow-up visits as told by your health care provider. This is important. Contact a health care provider if:  Your symptoms are not improving with home care. Summary  Trigger finger, also called stenosing tenosynovitis, causes your finger to get stuck in a bent position. This can make it difficult and painful to straighten your finger.  This condition develops when a finger tendon or tendon sheath thickens.  Treatment may include resting your finger, wearing a splint, and taking medicines.  In severe cases, surgery to open the tendon sheath may be needed. This information is not intended to replace advice given to you by your health care provider. Make sure you discuss any questions you have with your health care provider.  Document Revised: 04/04/2019 Document Reviewed: 04/04/2019 Elsevier Patient Education  2020 [employer].  Trigger Point Injection Trigger points are areas where you have pain. A trigger point injection is a shot given in the trigger point to help relieve pain for a few days to a few months. Common places for trigger points include:  The neck.  The shoulders.  The upper back.  The lower back. A trigger point injection will not cure long-term (chronic) pain permanently. These injections do not always work for every person. For some people, they can help to relieve pain for a few days to a few months. Tell a health care provider about:  Any allergies you have.  All medicines you are taking, including  vitamins, herbs, eye drops, creams, and over-the-counter medicines.  Any problems you or family members have had with anesthetic medicines.  Any blood disorders you have.  Any surgeries you have had.  Any medical conditions you have. What are the risks? Generally, this is a safe procedure. However, problems may occur, including:  Infection.  Bleeding or bruising.  Allergic reaction to the injected medicine.  Irritation of the skin around the injection site. What happens before the procedure? Ask your health care provider about:  Changing or stopping your regular medicines. This is especially important if you are taking diabetes medicines or blood thinners.  Taking medicines such as aspirin and ibuprofen. These medicines can thin your blood. Do not take these medicines unless your health care provider tells you to take them.  Taking over-the-counter medicines,  vitamins, herbs, and supplements. What happens during the procedure?   Your health care provider will feel for trigger points. A marker may be used to circle the area for the injection.  The skin over the trigger point will be washed with a germ-killing (antiseptic) solution.  A thin needle is used for the injection. You may feel pain or a twitching feeling when the needle enters the trigger point.  A numbing solution may be injected into the trigger point. Sometimes a medicine to keep down inflammation is also injected.  Your health care provider may move the needle around the area where the trigger point is located until the tightness and twitching goes away.  After the injection, your health care provider may put gentle pressure over the injection site.  The injection site will be covered with a bandage (dressing). The procedure may vary among health care providers and hospitals. What can I expect after treatment? After treatment, you may have:  Soreness and stiffness for 1-2 days.  A dressing. This can be  taken off in a few hours or as told by your health care provider. Follow these instructions at home: Injection site care  Remove your dressing as told by your health care provider.  Check your injection site every day for signs of infection. Check for: ? Redness, swelling, or pain. ? Fluid or blood. ? Warmth. ? Pus or a bad smell. Managing pain, stiffness, and swelling  If directed, put ice on the affected area. ? Put ice in a plastic bag. ? Place a towel between your skin and the bag. ? Leave the ice on for 20 minutes, 2-3 times a day. General instructions  If you were asked to stop your regular medicines, ask your health care provider when you may start taking them again.  Return to your normal activities as told by your health care provider. Ask your health care provider what activities are safe for you.  Do not take baths, swim, or use a hot tub until your health care provider approves.  You may be asked to see an occupational or physical therapist for exercises that reduce muscle strain and stretch the area of the trigger point.  Keep all follow-up visits as told by your health care provider. This is important. Contact a health care provider if:  Your pain comes back, and it is worse than before the injection. You may need more injections.  You have chills or a fever.  The injection site becomes more painful, red, swollen, or warm to the touch. Summary  A trigger point injection is a shot given in the trigger point to help relieve pain for a few days to a few months.  Common places for trigger point injections are the neck, shoulder, upper back, and lower back.  These injections do not always work for every person, but for some people, the injections can help to relieve pain for a few days to a few months.  Contact a health care provider if symptoms come back or they are worse than before treatment. Also, get help if the injection site becomes more painful, red,  swollen, or warm to the touch. This information is not intended to replace advice given to you by your health care provider. Make sure you discuss any questions you have with your health care provider. Document Revised: 12/29/2018 Document Reviewed: 12/29/2018 Elsevier Patient Education  2020 [employer].   Follow-Up Instructions: Return in about 2 weeks (around 06/19/2020).   Orders:  Orders  Placed This Encounter  Procedures  . Hand/UE Inj   No orders of the defined types were placed in this encounter.     Procedures: Hand/UE Inj for trigger finger on 06/05/2020 11:13 AM Indications: therapeutic and tendon swelling Details: 25 G needle, volar approach Medications: 1 mL lidocaine (PF) 1 %; 20 mg methylPREDNISolone acetate 40 MG/ML; 1 mL bupivacaine 0.5 %  Bandaid applied Consent was given by the patient. Patient was prepped and draped in the usual sterile fashion.       Clinical Data: No additional findings.   Subjective: Chief Complaint  Patient presents with  . Right Middle Finger - trigger finger, Pain    Sometimes he can use one finger to open in and sometimes he has to use his other hand to open it.  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 No with popping an catching of the right long finger MCP joint with flexion and extension. No history of trigger finger in the past.    Review of Systems  Constitutional: Negative.   HENT: Negative.   Eyes: Negative.   Respiratory: Negative.   Cardiovascular: Negative.   Gastrointestinal: Negative.   Endocrine: Negative.   Genitourinary: Negative.   Musculoskeletal: Negative.   Skin: Negative.   Allergic/Immunologic: Negative.   Neurological: Negative.   Hematological: Negative.   Psychiatric/Behavioral: Negative.      Objective: Vital Signs: BP (!) 85/64 (BP Location: Left Arm, Patient Position: Sitting)   Ht 6\' 1"  (1.854 m)   Wt  236 lb (107 kg)   BMI 31.14 kg/m   Physical Exam Constitutional:      Appearance: He is well-developed.  HENT:     Head: Normocephalic and atraumatic.  Eyes:     Pupils: Pupils are equal, round, and reactive to light.  Pulmonary:     Effort: Pulmonary effort is normal.     Breath sounds: Normal breath sounds.  Abdominal:     General: Bowel sounds are normal.     Palpations: Abdomen is soft.  Musculoskeletal:        General: Normal range of motion.     Cervical back: Normal range of motion and neck supple.  Skin:    General: Skin is warm and dry.  Neurological:     Mental Status: He is alert and oriented to person, place, and time.  Psychiatric:        Behavior: Behavior normal.        Thought Content: Thought content normal.        Judgment: Judgment normal.     Right Hand Exam   Range of Motion  Wrist  Extension: normal  Flexion: normal  Pronation: normal  Hand  MP Middle: 60  PIP Middle: 80   Muscle Strength  Wrist extension: 5/5  Wrist flexion: 5/5  Grip: 5/5   Tests  Phalen's Sign: negative Tinel's sign (median nerve): negative  Other  Erythema: absent  Comments:  Popping right long finger.      Specialty Comments:  No specialty comments available.  Imaging: No results found.   PMFS History: Patient Active Problem List   Diagnosis Date Noted  . Unilateral primary osteoarthritis, right knee 07/26/2018    Priority: High    Class: End Stage  . Anemia due to blood loss, acute 07/29/2018    Priority: Medium    Class: Acute  . Viral illness 07/05/2019  .  DVT (deep venous thrombosis) (HCC) 07/28/2018  . S/P TKR (total knee replacement) using cement, right 07/26/2018  . Flu-like symptoms 01/23/2018  . Dyspnea 01/07/2018  . HTN (hypertension) 10/30/2016  . Cough 10/30/2016  . Asthma with exacerbation 10/30/2016  . Edema 11/22/2015  . Erectile dysfunction 01/17/2015  . Corneal abrasion, right, sequela 06/27/2014  . Wellness examination  09/23/2011  . Impaired glucose tolerance 09/23/2011  . Hyperlipidemia 08/14/2010  . Extrinsic asthma 09/08/2009  . ALLERGIC RHINITIS 12/31/2007  . OBESITY 07/18/2007  . GERD 07/18/2007  . DEGENERATIVE JOINT DISEASE 07/18/2007   Past Medical History:  Diagnosis Date  . ALLERGIC RHINITIS 12/31/2007  . COUGH, CHRONIC 07/18/2007  . DEGENERATIVE JOINT DISEASE 07/18/2007  . Extrinsic asthma, unspecified 09/08/2009  . GERD 07/18/2007  . HYPERLIPIDEMIA 08/14/2010  . Hypertension   . Impaired glucose tolerance 09/23/2011  . OBESITY 07/18/2007    History reviewed. No pertinent family history.  Past Surgical History:  Procedure Laterality Date  . BACK SURGERY    . DG FEMUR LEFT  ([facility] HX)     rod placed  . TOOTH EXTRACTION    . TOTAL KNEE ARTHROPLASTY Right 07/26/2018   Procedure: RIGHT TOTAL KNEE ARTHROPLASTY;  Surgeon: Kerrin Champagne, MD;  Location: [facility] OR;  Service: Orthopedics;  Laterality: Right;   Social History   Occupational History  . Occupation: truck Hospital doctor  Tobacco Use  . Smoking status: Former Games developer  . Smokeless tobacco: Never Used  . Tobacco comment: Marijuana 30 years  Vaping Use  . Vaping Use: Never used  Substance and Sexual Activity  . Alcohol use: Yes    Comment: occ  . Drug use: Never  . Sexual activity: Not on file

## 2020-06-15 ENCOUNTER — Encounter: Payer: Self-pay | Admitting: Internal Medicine

## 2020-06-15 ENCOUNTER — Ambulatory Visit (INDEPENDENT_AMBULATORY_CARE_PROVIDER_SITE_OTHER): Payer: BC Managed Care – PPO | Admitting: Internal Medicine

## 2020-06-15 ENCOUNTER — Ambulatory Visit: Payer: BC Managed Care – PPO | Admitting: Specialist

## 2020-06-15 ENCOUNTER — Other Ambulatory Visit: Payer: Self-pay

## 2020-06-15 VITALS — BP 136/86 | HR 65 | Temp 97.8°F | Ht 73.0 in | Wt 234.0 lb

## 2020-06-15 DIAGNOSIS — E538 Deficiency of other specified B group vitamins: Secondary | ICD-10-CM | POA: Diagnosis not present

## 2020-06-15 DIAGNOSIS — E559 Vitamin D deficiency, unspecified: Secondary | ICD-10-CM

## 2020-06-15 DIAGNOSIS — Z0000 Encounter for general adult medical examination without abnormal findings: Secondary | ICD-10-CM | POA: Diagnosis not present

## 2020-06-15 DIAGNOSIS — R7302 Impaired glucose tolerance (oral): Secondary | ICD-10-CM

## 2020-06-15 DIAGNOSIS — Z23 Encounter for immunization: Secondary | ICD-10-CM

## 2020-06-15 DIAGNOSIS — Z1211 Encounter for screening for malignant neoplasm of colon: Secondary | ICD-10-CM | POA: Diagnosis not present

## 2020-06-15 DIAGNOSIS — Z20822 Contact with and (suspected) exposure to covid-19: Secondary | ICD-10-CM

## 2020-06-15 NOTE — Progress Notes (Signed)
Subjective:    Patient ID: [name], male    DOB: 1951/05/12, 69 y.o.   MRN: [id number]  HPI  Here for wellness and f/u;  Overall doing ok;  Pt denies Chest pain, worsening SOB, DOE, wheezing, orthopnea, PND, worsening LE edema, palpitations, dizziness or syncope except ? More doe than previously with heavy lifting at work.  Pt denies neurological change such as new headache, facial or extremity weakness.  Pt denies polydipsia, polyuria, or low sugar symptoms. Pt states overall good compliance with treatment and medications, good tolerability, and has been trying to follow appropriate diet.  Pt denies worsening depressive symptoms, suicidal ideation or panic. No fever, night sweats, wt loss, loss of appetite, or other constitutional symptoms.  Pt states good ability with ADL's, has low fall risk, home safety reviewed and adequate, no other significant changes in hearing or vision, and only occasionally active with exercise.  Also Only tae the naproxyn and gabapentin at bedtime.  No other new complaints Past Medical History:  Diagnosis Date  . ALLERGIC RHINITIS 12/31/2007  . COUGH, CHRONIC 07/18/2007  . DEGENERATIVE JOINT DISEASE 07/18/2007  . Extrinsic asthma, unspecified 09/08/2009  . GERD 07/18/2007  . HYPERLIPIDEMIA 08/14/2010  . Hypertension   . Impaired glucose tolerance 09/23/2011  . OBESITY 07/18/2007   Past Surgical History:  Procedure Laterality Date  . BACK SURGERY    . DG FEMUR LEFT  ([facility] HX)     rod placed  . TOOTH EXTRACTION    . TOTAL KNEE ARTHROPLASTY Right 07/26/2018   Procedure: RIGHT TOTAL KNEE ARTHROPLASTY;  Surgeon: Kerrin Champagne, MD;  Location: [facility] OR;  Service: Orthopedics;  Laterality: Right;    reports that he has quit smoking. He has never used smokeless tobacco. He reports current alcohol use. He reports that he does not use drugs. family history is not on file. No Known Allergies Current Outpatient Medications on File Prior to Visit  Medication Sig Dispense  Refill  . albuterol (PROVENTIL HFA;VENTOLIN HFA) 108 (90 Base) MCG/ACT inhaler Inhale 2 puffs into the lungs every 6 (six) hours as needed for wheezing or shortness of breath. 3 Inhaler 3  . amoxicillin-clavulanate (AUGMENTIN) 875-125 MG tablet Take one tablet po one hour prior to dental procedure and then repeat dose 12 hours following the first dose. 4 tablet 0  . baclofen (LIORESAL) 10 MG tablet Take 1 tablet (10 mg total) by mouth 3 (three) times daily. 270 tablet 3  . baclofen (LIORESAL) 20 MG tablet 1 po tid prn muscle spasms 90 each 1  . budesonide-formoterol (SYMBICORT) 160-4.5 MCG/ACT inhaler Inhale 1 puff into the lungs every evening. 10.2 g 3  . cephALEXin (KEFLEX) 500 MG capsule Take 1 capsule (500 mg total) by mouth 4 (four) times daily. 28 capsule 1  . diclofenac sodium (VOLTAREN) 1 % GEL Apply 2 g topically 4 (four) times daily. 350 g 6  . docusate sodium (COLACE) 100 MG capsule Take 1 capsule (100 mg total) by mouth 2 (two) times daily. 40 capsule 0  . esomeprazole (NEXIUM) 40 MG capsule TAKE 1 CAPSULE DAILY BEFORE BREAKFAST 90 capsule 1  . gabapentin (NEURONTIN) 300 MG capsule Take 1 capsule (300 mg total) by mouth 2 (two) times daily. 60 capsule 2  . gabapentin (NEURONTIN) 300 MG capsule Take 1 capsule (300 mg total) by mouth 2 (two) times daily. 180 capsule 4  . losartan (COZAAR) 100 MG tablet Take 1 tablet (100 mg total) by mouth daily. Annual appt due in  March must see provider for future refills 30 tablet 0  . naproxen (NAPROSYN) 500 MG tablet TAKE 1 TABLET TWICE A DAY WITH FOOD 180 tablet 3  . tadalafil (CIALIS) 20 MG tablet Take 1 tablet (20 mg total) by mouth daily as needed for erectile dysfunction. 10 tablet 11   No current facility-administered medications on file prior to visit.   Review of Systems All otherwise neg per pt    Objective:   Physical Exam BP 136/86 (BP Location: Left Arm, Patient Position: Sitting, Cuff Size: Large)   Pulse 65   Temp 97.8 F (36.6  C) (Oral)   Ht 6\' 1"  (1.854 m)   Wt 234 lb (106.1 kg)   SpO2 96%   BMI 30.87 kg/m  VS noted,  Constitutional: Pt appears in NAD HENT: Head: NCAT.  Right Ear: External ear normal.  Left Ear: External ear normal.  Eyes: . Pupils are equal, round, and reactive to light. Conjunctivae and EOM are normal Nose: without d/c or deformity Neck: Neck supple. Gross normal ROM Cardiovascular: Normal rate and regular rhythm.   Pulmonary/Chest: Effort normal and breath sounds without rales or wheezing.  Abd:  Soft, NT, ND, + BS, no organomegaly Neurological: Pt is alert. At baseline orientation, motor grossly intact Skin: Skin is warm. No rashes, other new lesions, no LE edema Psychiatric: Pt behavior is normal without agitation  All otherwise neg per pt Lab Results  Component Value Date   WBC 6.0 06/15/2020   HGB 15.0 06/15/2020   HCT 44.9 06/15/2020   PLT 204 06/15/2020   GLUCOSE 85 06/15/2020   CHOL 182 06/15/2020   TRIG 125 06/15/2020   HDL 49 06/15/2020   LDLDIRECT 132.9 09/23/2011   LDLCALC 109 (H) 06/15/2020   ALT 21 06/15/2020   AST 20 06/15/2020   NA 142 06/15/2020   K 4.5 06/15/2020   CL 106 06/15/2020   CREATININE 1.24 06/15/2020   BUN 16 06/15/2020   CO2 26 06/15/2020   TSH 1.29 06/15/2020   PSA 1.2 06/15/2020   INR 0.96 07/16/2018   HGBA1C 5.5 06/15/2020      Assessment & Plan:

## 2020-06-15 NOTE — Patient Instructions (Addendum)
You will be contacted about the cologuard  You had the Prevnar 13 pneumonia shot today  Please continue all other medications as before, and refills have been done if requested.  Please have the pharmacy call with any other refills you may need.  Please continue your efforts at being more active, low cholesterol diet, and weight control.  You are otherwise up to date with prevention measures today.  Please keep your appointments with your specialists as you may have planned  Please go to the LAB at the blood drawing area for the tests to be done  You will be contacted by phone if any changes need to be made immediately.  Otherwise, you will receive a letter about your results with an explanation, but please check with MyChart first.  Please remember to sign up for MyChart if you have not done so, as this will be important to you in the future with finding out test results, communicating by private email, and scheduling acute appointments online when needed.  Please make an Appointment to return for your 1 year visit, or sooner if needed

## 2020-06-16 ENCOUNTER — Encounter: Payer: Self-pay | Admitting: Internal Medicine

## 2020-06-16 LAB — BASIC METABOLIC PANEL
BUN: 16 mg/dL (ref 7–25)
CO2: 26 mmol/L (ref 20–32)
Calcium: 9.9 mg/dL (ref 8.6–10.3)
Chloride: 106 mmol/L (ref 98–110)
Creat: 1.24 mg/dL (ref 0.70–1.25)
Glucose, Bld: 85 mg/dL (ref 65–99)
Potassium: 4.5 mmol/L (ref 3.5–5.3)
Sodium: 142 mmol/L (ref 135–146)

## 2020-06-16 LAB — CBC WITH DIFFERENTIAL/PLATELET
Absolute Monocytes: 720 cells/uL (ref 200–950)
Basophils Absolute: 48 cells/uL (ref 0–200)
Basophils Relative: 0.8 %
Eosinophils Absolute: 138 cells/uL (ref 15–500)
Eosinophils Relative: 2.3 %
HCT: 44.9 % (ref 38.5–50.0)
Hemoglobin: 15 g/dL (ref 13.2–17.1)
Lymphs Abs: 1908 cells/uL (ref 850–3900)
MCH: 30.8 pg (ref 27.0–33.0)
MCHC: 33.4 g/dL (ref 32.0–36.0)
MCV: 92.2 fL (ref 80.0–100.0)
MPV: 12.1 fL (ref 7.5–12.5)
Monocytes Relative: 12 %
Neutro Abs: 3186 cells/uL (ref 1500–7800)
Neutrophils Relative %: 53.1 %
Platelets: 204 10*3/uL (ref 140–400)
RBC: 4.87 10*6/uL (ref 4.20–5.80)
RDW: 12.5 % (ref 11.0–15.0)
Total Lymphocyte: 31.8 %
WBC: 6 10*3/uL (ref 3.8–10.8)

## 2020-06-16 LAB — URINALYSIS, ROUTINE W REFLEX MICROSCOPIC
Bilirubin Urine: NEGATIVE
Glucose, UA: NEGATIVE
Hgb urine dipstick: NEGATIVE
Ketones, ur: NEGATIVE
Leukocytes,Ua: NEGATIVE
Nitrite: NEGATIVE
Protein, ur: NEGATIVE
Specific Gravity, Urine: 1.012 (ref 1.001–1.03)
pH: 6.5 (ref 5.0–8.0)

## 2020-06-16 LAB — HEPATIC FUNCTION PANEL
AG Ratio: 1.8 (calc) (ref 1.0–2.5)
ALT: 21 U/L (ref 9–46)
AST: 20 U/L (ref 10–35)
Albumin: 4.4 g/dL (ref 3.6–5.1)
Alkaline phosphatase (APISO): 83 U/L (ref 35–144)
Bilirubin, Direct: 0.2 mg/dL (ref 0.0–0.2)
Globulin: 2.4 g/dL (calc) (ref 1.9–3.7)
Indirect Bilirubin: 0.8 mg/dL (calc) (ref 0.2–1.2)
Total Bilirubin: 1 mg/dL (ref 0.2–1.2)
Total Protein: 6.8 g/dL (ref 6.1–8.1)

## 2020-06-16 LAB — LIPID PANEL
Cholesterol: 182 mg/dL (ref <200)
HDL: 49 mg/dL (ref >=40)
LDL Cholesterol (Calc): 109 mg/dL (calc) — ABNORMAL HIGH
Non-HDL Cholesterol (Calc): 133 mg/dL (calc) — ABNORMAL HIGH (ref <130)
Total CHOL/HDL Ratio: 3.7 (calc) (ref <5.0)
Triglycerides: 125 mg/dL (ref <150)

## 2020-06-16 LAB — HEMOGLOBIN A1C
Hgb A1c MFr Bld: 5.5 % of total Hgb (ref <5.7)
Mean Plasma Glucose: 111 (calc)
eAG (mmol/L): 6.2 (calc)

## 2020-06-16 LAB — VITAMIN D 25 HYDROXY (VIT D DEFICIENCY, FRACTURES): Vit D, 25-Hydroxy: 39 ng/mL (ref 30–100)

## 2020-06-16 LAB — VITAMIN B12: Vitamin B-12: 549 pg/mL (ref 200–1100)

## 2020-06-16 LAB — TSH: TSH: 1.29 mIU/L (ref 0.40–4.50)

## 2020-06-16 LAB — PSA: PSA: 1.2 ng/mL (ref <=4.0)

## 2020-06-16 NOTE — Assessment & Plan Note (Signed)

## 2020-06-16 NOTE — Assessment & Plan Note (Signed)
stable overall by history and exam, recent data reviewed with pt, and pt to continue medical treatment as before,  to f/u any worsening symptoms or concerns  

## 2020-06-18 ENCOUNTER — Telehealth: Payer: Self-pay | Admitting: Internal Medicine

## 2020-06-18 LAB — NOVEL CORONAVIRUS, NAA

## 2020-06-18 NOTE — Telephone Encounter (Signed)
   1.Medication Requested: losartan (COZAAR) 100 MG tablet  2. Pharmacy (Name, Street, City):[facility] - [place], [place] - [address]  3. On Med List: yes  4. Last Visit with PCP: 06/15/2020  5. Next visit date with PCP:n/a   Agent: Please be advised that RX refills may take up to 3 business days. We ask that you follow-up with your pharmacy.

## 2020-06-19 ENCOUNTER — Other Ambulatory Visit: Payer: Self-pay

## 2020-06-19 MED ORDER — LOSARTAN POTASSIUM 100 MG PO TABS: 100.0000 mg | ORAL_TABLET | Freq: Every day | ORAL | 0 refills | Status: DC

## 2020-06-19 NOTE — Telephone Encounter (Signed)
Sent in to pharmacy.  

## 2020-06-20 ENCOUNTER — Other Ambulatory Visit: Payer: Self-pay | Admitting: Specialist

## 2020-06-20 NOTE — Telephone Encounter (Signed)
Pls advise.  

## 2020-06-21 NOTE — Telephone Encounter (Signed)
Okay to give baclofen 10 mg p.o. every 8 hours as needed spasms number 40 tablets no refills.  I am not writing for 270 tablets with the same amount of refills.

## 2020-06-25 ENCOUNTER — Other Ambulatory Visit: Payer: Self-pay

## 2020-06-25 ENCOUNTER — Telehealth: Payer: Self-pay | Admitting: Internal Medicine

## 2020-06-25 MED ORDER — LOSARTAN POTASSIUM 100 MG PO TABS: 100.0000 mg | ORAL_TABLET | Freq: Every day | ORAL | 1 refills | Status: DC

## 2020-06-25 NOTE — Telephone Encounter (Signed)
New message:    Pt is calling and states when he is refilling medication through [facility] and states he needs 90 day supply not a 30 supply. He states if the losartan (COZAAR) 100 MG tablet has not been sent by [facility] yet then he would like the order resent. Please advise.

## 2020-07-10 LAB — SARS-COV-2 SEMI-QUANTITATIVE TOTAL ANTIBODY, SPIKE
SARS-CoV-2 Semi-Quant Total Ab: >2500 U/mL (ref <0.8)
SARS-CoV-2 Spike Ab Interp: POSITIVE

## 2020-07-10 LAB — SPECIMEN STATUS REPORT

## 2020-07-16 ENCOUNTER — Encounter: Payer: Self-pay | Admitting: Internal Medicine

## 2020-07-16 DIAGNOSIS — Z1211 Encounter for screening for malignant neoplasm of colon: Secondary | ICD-10-CM | POA: Diagnosis not present

## 2020-07-16 DIAGNOSIS — Z1212 Encounter for screening for malignant neoplasm of rectum: Secondary | ICD-10-CM | POA: Diagnosis not present

## 2020-07-16 LAB — COLOGUARD: Cologuard: NEGATIVE

## 2020-07-21 LAB — EXTERNAL GENERIC LAB PROCEDURE: COLOGUARD: NEGATIVE

## 2020-07-21 LAB — COLOGUARD
COLOGUARD: NEGATIVE
Cologuard: NEGATIVE

## 2020-07-23 ENCOUNTER — Encounter: Payer: Self-pay | Admitting: Internal Medicine

## 2020-08-30 ENCOUNTER — Telehealth (INDEPENDENT_AMBULATORY_CARE_PROVIDER_SITE_OTHER): Payer: BC Managed Care – PPO | Admitting: Internal Medicine

## 2020-08-30 DIAGNOSIS — J209 Acute bronchitis, unspecified: Secondary | ICD-10-CM

## 2020-08-30 MED ORDER — HYDROCOD POLST-CPM POLST ER 10-8 MG/5ML PO SUER: 5.0000 mL | Freq: Two times a day (BID) | ORAL | 0 refills | Status: DC | PRN

## 2020-08-30 MED ORDER — AMOXICILLIN-POT CLAVULANATE 875-125 MG PO TABS
1.0000 | ORAL_TABLET | Freq: Two times a day (BID) | ORAL | 0 refills | Status: DC
Start: 2020-08-30 — End: 2020-12-21

## 2020-08-30 NOTE — Assessment & Plan Note (Signed)
Augmentin Tussionex Call if problems

## 2020-08-30 NOTE — Progress Notes (Signed)
Virtual Visit via Video Note  I connected with [name] on 08/30/20 at 11:00 AM EDT by a video enabled telemedicine application and verified that I am speaking with the correct person using two identifiers.   I discussed the limitations of evaluation and management by telemedicine and the availability of in person appointments. The patient expressed understanding and agreed to proceed.  I was located at our [facility] office. The patient was at home. There was no one else present in the visit.   History of Present Illness: We need to follow-up on cough x 2-3 d. COVID (-). Yellow mucus  There has been runny nose, cough. No chest pain, shortness of breath, abdominal pain, diarrhea.   Observations/Objective: The patient appears to be in no acute distress, looks tired. Coughing  Assessment and Plan:  See my Assessment and Plan. Follow Up Instructions:    I discussed the assessment and treatment plan with the patient. The patient was provided an opportunity to ask questions and all were answered. The patient agreed with the plan and demonstrated an understanding of the instructions.   The patient was advised to call back or seek an in-person evaluation if the symptoms worsen or if the condition fails to improve as anticipated.  I provided face-to-face time during this encounter. We were at different locations.   Sonda Primes, MD

## 2020-10-04 ENCOUNTER — Other Ambulatory Visit: Payer: Self-pay | Admitting: Internal Medicine

## 2020-10-04 NOTE — Telephone Encounter (Signed)
Please refill as per office routine med refill policy (all routine meds refilled for 3 mo or monthly per pt preference up to one year from last visit, then month to month grace period for 3 mo, then further med refills will have to be denied)  

## 2020-12-06 ENCOUNTER — Other Ambulatory Visit: Payer: Self-pay | Admitting: Radiology

## 2020-12-06 MED ORDER — NAPROXEN 500 MG PO TABS: 500.0000 mg | ORAL_TABLET | Freq: Two times a day (BID) | ORAL | 3 refills | Status: DC

## 2020-12-20 ENCOUNTER — Other Ambulatory Visit: Payer: Self-pay

## 2020-12-21 ENCOUNTER — Encounter: Payer: Self-pay | Admitting: Internal Medicine

## 2020-12-21 ENCOUNTER — Encounter: Payer: Self-pay | Admitting: Specialist

## 2020-12-21 ENCOUNTER — Ambulatory Visit: Payer: BC Managed Care – PPO | Admitting: Internal Medicine

## 2020-12-21 ENCOUNTER — Ambulatory Visit: Payer: Self-pay

## 2020-12-21 ENCOUNTER — Ambulatory Visit: Payer: BC Managed Care – PPO | Admitting: Specialist

## 2020-12-21 VITALS — BP 118/76 | HR 129 | Temp 98.3°F | Ht 73.0 in | Wt 249.0 lb

## 2020-12-21 VITALS — BP 100/62 | HR 138 | Ht 73.0 in | Wt 236.0 lb

## 2020-12-21 DIAGNOSIS — M65331 Trigger finger, right middle finger: Secondary | ICD-10-CM

## 2020-12-21 DIAGNOSIS — R0600 Dyspnea, unspecified: Secondary | ICD-10-CM | POA: Diagnosis not present

## 2020-12-21 DIAGNOSIS — E7849 Other hyperlipidemia: Secondary | ICD-10-CM

## 2020-12-21 DIAGNOSIS — S62014A Nondisplaced fracture of distal pole of navicular [scaphoid] bone of right wrist, initial encounter for closed fracture: Secondary | ICD-10-CM | POA: Diagnosis not present

## 2020-12-21 DIAGNOSIS — R000 Tachycardia, unspecified: Secondary | ICD-10-CM

## 2020-12-21 DIAGNOSIS — R0609 Other forms of dyspnea: Secondary | ICD-10-CM | POA: Insufficient documentation

## 2020-12-21 DIAGNOSIS — R7302 Impaired glucose tolerance (oral): Secondary | ICD-10-CM

## 2020-12-21 DIAGNOSIS — I10 Essential (primary) hypertension: Secondary | ICD-10-CM | POA: Diagnosis not present

## 2020-12-21 DIAGNOSIS — M7541 Impingement syndrome of right shoulder: Secondary | ICD-10-CM | POA: Diagnosis not present

## 2020-12-21 MED ORDER — BUPIVACAINE HCL 0.5 % IJ SOLN
1.0000 mL | INTRAMUSCULAR | Status: AC | PRN
  Administered 2020-12-21: 1 mL

## 2020-12-21 MED ORDER — LIDOCAINE HCL (PF) 1 % IJ SOLN
1.0000 mL | INTRAMUSCULAR | Status: AC | PRN
  Administered 2020-12-21: 1 mL

## 2020-12-21 MED ORDER — METHYLPREDNISOLONE ACETATE 40 MG/ML IJ SUSP
13.3300 mg | INTRAMUSCULAR | Status: AC | PRN
  Administered 2020-12-21: 13.33 mg

## 2020-12-21 NOTE — Progress Notes (Signed)
Office Visit Note   Patient: [name]           Date of Birth: July 10, 1951           MRN: [id number] Visit Date: 12/21/2020              Requested by: Corwin Levins, MD [address] [place],  [place] [address] PCP: Corwin Levins, MD   Assessment & Plan: Visit Diagnoses:  1. Trigger middle finger of right hand   2. Nondisplaced fracture of distal pole of navicular (scaphoid) bone of right wrist, initial encounter for closed fracture   3. Impingement syndrome of right shoulder     Plan: Wear splint right wrist, seen hand specialist. The blood supply to the right wrist scaphoid bone may be Injured due to the nondisplace scaphoid fracture thant is likely 6 months old. Referral to hand specialist Dr. Rica Mast for assessment and treatment of right wrist.  Right shoulder impingment and tendonitis and bursitis, recommend the use of naprosyn 500 mg BID with meal or snack Will xray right shoulder at return visit. Exercises to perform for ROM and strengthening the right shoulder.Avoid overhead lifting and overhead use of the arms. Pillows to keep from sleeping directly on the shoulders Limited lifting to less than 10 lbs. Ice or heat for relief. NSAIDs are helpful, such as alleve or motrin, be careful not to use in excess as they place burdens on the kidney. Stretching exercise help and strengthening is helpful to build endurance.   Follow-Up Instructions: No follow-ups on file.   Orders:  Orders Placed This Encounter  Procedures   Hand/UE Inj   XR Hand Complete Right   MR Wrist Right w/o contrast   No orders of the defined types were placed in this encounter.     Procedures: Hand/UE Inj for trigger finger on 12/21/2020 11:48 AM Indications: joint swelling, pain and tendon swelling Details: volar approach Medications: 1 mL bupivacaine 0.5 %; 1 mL lidocaine (PF) 1 %; 13.33 mg methylPREDNISolone acetate 40 MG/ML Aspirate: clear Consent was given by the patient.        Clinical Data: No additional findings.   Subjective: Chief Complaint  Patient presents with   Right Middle Finger - Pain    70 year old right handed male with history of right long finger trigger finger last injected about 6 months ago 05/2020 and had good improvement in his pain pattern. The right wrist is painful since a fall he had in 05/2020, not examined at the July visit, he has had intermittant pain with ROM right wrist mainly over the dorsal mid portion of the wrist. It Will catch with severe pain no swelling. No numbness or paresthesia. The triggering in the right long finger has returned. Injury to the right wrist was a hyperextension that occurred when he Was loading the car to return back to [place] from [facility] he tripped and fell landing on the right outstretched hand and rolled hitting the right side of head, no LOC. Did not go to ER and got up and  Looked around to see if any body saw the incident. He did not have a lot of pain and continued to work and returned to [place].    Review of Systems  Constitutional: Negative.   HENT: Negative.   Eyes: Negative.   Respiratory: Negative.   Cardiovascular: Negative.   Gastrointestinal: Negative.   Endocrine: Negative.   Genitourinary: Negative.   Musculoskeletal: Negative.   Skin: Negative.  Allergic/Immunologic: Negative.   Neurological: Negative.   Hematological: Negative.   Psychiatric/Behavioral: Negative.      Objective: Vital Signs: BP 100/62 (BP Location: Left Arm, Patient Position: Sitting)    Pulse (!) 138    Ht 6\' 1"  (1.854 m)    Wt 236 lb (107 kg)    BMI 31.14 kg/m   Physical Exam Constitutional:      Appearance: He is well-developed and well-nourished.  HENT:     Head: Normocephalic and atraumatic.  Eyes:     Extraocular Movements: EOM normal.     Pupils: Pupils are equal, round, and reactive to light.  Pulmonary:     Effort: Pulmonary effort is normal.     Breath sounds: Normal breath  sounds.  Abdominal:     General: Bowel sounds are normal.     Palpations: Abdomen is soft.  Musculoskeletal:        General: Normal range of motion.     Cervical back: Normal range of motion and neck supple.  Skin:    General: Skin is warm and dry.  Neurological:     Mental Status: He is alert and oriented to person, place, and time.  Psychiatric:        Mood and Affect: Mood and affect normal.        Behavior: Behavior normal.        Thought Content: Thought content normal.        Judgment: Judgment normal.     Ortho Exam  Specialty Comments:  No specialty comments available.  Imaging: XR Hand Complete Right  Result Date: 12/21/2020 AP, lateral  And oblique radiographs right wrist demonstrate severe right thumb metacarpotrapezial arthrosis changes with loss of the normal joint line due to arthrosis changes with radial subluxation of the thumb MC-C joint with sclerosis and osteophyte present this is Eaton 4 changes. There is a nondisplaced distal pole scaphiod fracture with a thin lucency londitudinally oriented and a T transverse component at the Distal 1/3 of the scaphoid there is no displacement with minimal distal scaphoid joint line stepoff less than 0.5 mm. The bone quality of the proximal scaphoid shows hawkins sign mildly positive to the proximal pole but there is decreased bone density on the immediate adjacent part of the fracture site suggests that the area immediately proximal to fracture site as having decreased bone density suggesting intact blood supply.      PMFS History: Patient Active Problem List   Diagnosis Date Noted   Unilateral primary osteoarthritis, right knee 07/26/2018    Priority: High    Class: End Stage   Anemia due to blood loss, acute 07/29/2018    Priority: Medium    Class: Acute   Viral illness 07/05/2019   DVT (deep venous thrombosis) (HCC) 07/28/2018   S/P TKR (total knee replacement) using cement, right 07/26/2018   Flu-like  symptoms 01/23/2018   Dyspnea 01/07/2018   HTN (hypertension) 10/30/2016   Cough 10/30/2016   Asthma with exacerbation 10/30/2016   Edema 11/22/2015   Erectile dysfunction 01/17/2015   Corneal abrasion, right, sequela 06/27/2014   Acute bronchitis 12/05/2013   Wellness examination 09/23/2011   Impaired glucose tolerance 09/23/2011   Hyperlipidemia 08/14/2010   Extrinsic asthma 09/08/2009   ALLERGIC RHINITIS 12/31/2007   OBESITY 07/18/2007   GERD 07/18/2007   DEGENERATIVE JOINT DISEASE 07/18/2007   Past Medical History:  Diagnosis Date   ALLERGIC RHINITIS 12/31/2007   COUGH, CHRONIC 07/18/2007   DEGENERATIVE JOINT DISEASE 07/18/2007   Extrinsic  asthma, unspecified 09/08/2009   GERD 07/18/2007   HYPERLIPIDEMIA 08/14/2010   Hypertension    Impaired glucose tolerance 09/23/2011   OBESITY 07/18/2007    History reviewed. No pertinent family history.  Past Surgical History:  Procedure Laterality Date   BACK SURGERY     DG FEMUR LEFT  [facility] HX)     rod placed   TOOTH EXTRACTION     TOTAL KNEE ARTHROPLASTY Right 07/26/2018   Procedure: RIGHT TOTAL KNEE ARTHROPLASTY;  Surgeon: Kerrin Champagne, MD;  Location: [facility] OR;  Service: Orthopedics;  Laterality: Right;   Social History   Occupational History   Occupation: truck Hospital doctor  Tobacco Use   Smoking status: Former Smoker   Smokeless tobacco: Never Used   Tobacco comment: Marijuana 30 years  Vaping Use   Vaping Use: Never used  Substance and Sexual Activity   Alcohol use: Yes    Comment: occ   Drug use: Never   Sexual activity: Not on file

## 2020-12-21 NOTE — Progress Notes (Signed)
Established Patient Office Visit  Subjective:  Patient ID: [name], male    DOB: [birth year]  Age: 70 y.o. MRN: [id number]      Chief Complaint: dyspnea       HPI:  CARTER KASSEL is a 70 y.o. male here with c/o unusual dyspnea, worse with exertion, not sure when started maybe in the past wk, has a fairly physical job so noticeable, Pt denies chest pain, increased sob or doe, wheezing, orthopnea, PND, increased LE swelling, palpitations, dizziness or syncope.  Pt denies new neurological symptoms such as new headache, or facial or extremity weakness or numbness   Pt denies polydipsia, polyuria  Pt denies fever, wt loss, night sweats, loss of appetite, or other constitutional symptoms, and denies high fever, HA, cough, chills.  .        Wt Readings from Last 3 Encounters:  12/22/20 236 lb (107 kg)  12/21/20 249 lb (112.9 kg)  12/21/20 236 lb (107 kg)   BP Readings from Last 3 Encounters:  12/22/20 124/90  12/21/20 118/76  12/21/20 100/62   Past Medical History:  Diagnosis Date  . ALLERGIC RHINITIS 12/31/2007  . COUGH, CHRONIC 07/18/2007  . DEGENERATIVE JOINT DISEASE 07/18/2007  . Extrinsic asthma, unspecified 09/08/2009  . GERD 07/18/2007  . HYPERLIPIDEMIA 08/14/2010  . Hypertension   . Impaired glucose tolerance 09/23/2011  . OBESITY 07/18/2007   Past Surgical History:  Procedure Laterality Date  . BACK SURGERY    . DG FEMUR LEFT  ([facility] HX)     rod placed  . TOOTH EXTRACTION    . TOTAL KNEE ARTHROPLASTY Right 07/26/2018   Procedure: RIGHT TOTAL KNEE ARTHROPLASTY;  Surgeon: Kerrin Champagne, MD;  Location: [facility] OR;  Service: Orthopedics;  Laterality: Right;    reports that he has quit smoking. He has never used smokeless tobacco. He reports current alcohol use. He reports that he does not use drugs. family history is not on file. No Known Allergies Current Outpatient Medications on File Prior to Visit  Medication Sig Dispense Refill  . albuterol (PROVENTIL HFA;VENTOLIN HFA)  108 (90 Base) MCG/ACT inhaler Inhale 2 puffs into the lungs every 6 (six) hours as needed for wheezing or shortness of breath. 3 Inhaler 3  . baclofen (LIORESAL) 10 MG tablet 1 po q 8hr prn 30 each 0  . budesonide-formoterol (SYMBICORT) 160-4.5 MCG/ACT inhaler Inhale 1 puff into the lungs every evening. 10.2 g 3  . chlorpheniramine-HYDROcodone (TUSSIONEX PENNKINETIC ER) 10-8 MG/5ML SUER Take 5 mLs by mouth every 12 (twelve) hours as needed for cough. 115 mL 0  . diclofenac sodium (VOLTAREN) 1 % GEL Apply 2 g topically 4 (four) times daily. 350 g 6  . docusate sodium (COLACE) 100 MG capsule Take 1 capsule (100 mg total) by mouth 2 (two) times daily. 40 capsule 0  . esomeprazole (NEXIUM) 40 MG capsule TAKE 1 CAPSULE DAILY BEFORE BREAKFAST 90 capsule 3  . gabapentin (NEURONTIN) 300 MG capsule Take 1 capsule (300 mg total) by mouth 2 (two) times daily. 180 capsule 4  . losartan (COZAAR) 100 MG tablet Take 1 tablet (100 mg total) by mouth daily. Annual appt due in March must see provider for future refills 90 tablet 1  . tadalafil (CIALIS) 20 MG tablet Take 1 tablet (20 mg total) by mouth daily as needed for erectile dysfunction. 10 tablet 11   No current facility-administered medications on file prior to visit.        ROS:  All others  reviewed and negative.  Objective        PE:  BP 118/76   Pulse (!) 129   Temp 98.3 F (36.8 C) (Temporal)   Ht 6\' 1"  (1.854 m)   Wt 249 lb (112.9 kg)   SpO2 97%   BMI 32.85 kg/m                 Constitutional: Pt appears in NAD               HENT: Head: NCAT.                Right Ear: External ear normal.                 Left Ear: External ear normal.                Eyes: . Pupils are equal, round, and reactive to light. Conjunctivae and EOM are normal               Nose: without d/c or deformity               Neck: Neck supple. Gross normal ROM               Cardiovascular: Normal rate and regular rhythm.                 Pulmonary/Chest: Effort normal  and breath sounds without rales or wheezing.                Abd:  Soft, NT, ND, + BS, no organomegaly               Neurological: Pt is alert. At baseline orientation, motor grossly intact               Skin: Skin is warm. No rashes, no other new lesions, LE edema - none               Psychiatric: Pt behavior is normal without agitation   Assessment/Plan:  [name] is a 70 y.o. White or Caucasian [1] male with  has a past medical history of ALLERGIC RHINITIS (12/31/2007), COUGH, CHRONIC (07/18/2007), DEGENERATIVE JOINT DISEASE (07/18/2007), Extrinsic asthma, unspecified (09/08/2009), GERD (07/18/2007), HYPERLIPIDEMIA (08/14/2010), Hypertension, Impaired glucose tolerance (09/23/2011), and OBESITY (07/18/2007).   Assessment Plan  See notes Labs/data reviewed for each problem:  Micro: none  Cardiac tracings I have personally interpreted today:  Atrial flutter 118, and 131 with non specific ST T changes  Pertinent Radiological findings (summarize): none    There are no preventive care reminders to display for this patient.  There are no preventive care reminders to display for this patient.  Lab Results  Component Value Date   TSH 1.29 06/15/2020   Lab Results  Component Value Date   WBC 8.6 12/22/2020   HGB 15.1 12/22/2020   HCT 46.0 12/22/2020   MCV 93.1 12/22/2020   PLT 216 12/22/2020   Lab Results  Component Value Date   NA 140 12/22/2020   K 4.0 12/22/2020   CO2 24 12/22/2020   GLUCOSE 144 (H) 12/22/2020   BUN 25 (H) 12/22/2020   CREATININE 1.26 (H) 12/22/2020   BILITOT 0.3 12/22/2020   ALKPHOS 75 12/22/2020   AST 22 12/22/2020   ALT 23 12/22/2020   PROT 7.1 12/22/2020   ALBUMIN 4.3 12/22/2020   CALCIUM 9.3 12/22/2020   ANIONGAP 8 12/22/2020   GFR 58.86 (L) 02/10/2019   Lab Results  Component Value  Date   CHOL 182 06/15/2020   Lab Results  Component Value Date   HDL 49 06/15/2020   Lab Results  Component Value Date   LDLCALC 109 (H) 06/15/2020   Lab  Results  Component Value Date   TRIG 125 06/15/2020   Lab Results  Component Value Date   CHOLHDL 3.7 06/15/2020   Lab Results  Component Value Date   HGBA1C 5.5 06/15/2020      Assessment & Plan:   Problem List Items Addressed This Visit      High   Tachycardia - Primary    C/w a aflutter, pt advised for ED now for more urgent complete evalatuation and possible cardiology consult      DOE (dyspnea on exertion)    Advised for lab and cxr, but will not need from me if goes to ED as advised, not clear if pt will go this evening, states he may go tomorrow as he has a refigerator being delivered in the AM and has to be there for that        Medium   Impaired glucose tolerance    Lab Results  Component Value Date   HGBA1C 5.5 06/15/2020   Stable, pt to continue current medical treatment - diet       Relevant Orders   Hemoglobin A1c   Hyperlipidemia   Relevant Orders   Lipid panel   Hepatic function panel   CBC with Differential/Platelet   TSH   Urinalysis, Routine w reflex microscopic   Basic metabolic panel   HTN (hypertension)    BP Readings from Last 3 Encounters:  12/22/20 124/90  12/21/20 118/76  12/21/20 100/62   Stable, pt to continue medical treatment  - losartan 100            No orders of the defined types were placed in this encounter.   Follow-up: Return if symptoms worsen or fail to improve.   Oliver Barre, MD 12/21/2020 3:42 PM [facility]

## 2020-12-21 NOTE — Patient Instructions (Addendum)
Your EKG was Ok today except the Atrial Flutter which is a fast heart rhythm that can cause shortness of breath  Please continue all other medications as before, and refills have been done if requested.  Please have the pharmacy call with any other refills you may need  Please go to the ED at [facility] now for more urgent evaluation at this time

## 2020-12-21 NOTE — Patient Instructions (Signed)
Plan: Wear splint right wrist, seen hand specialist. The blood supply to the right wrist scaphoid bone may be Injured due to the nondisplace scaphoid fracture thant is likely 6 months old. Referral to hand specialist Dr. Rica Mast for assessment and treatment of right wrist.  Right shoulder impingment and tendonitis and bursitis, recommend the use of naprosyn 500 mg BID with meal or snack Will xray right shoulder at return visit. Exercises to perform for ROM and strengthening the right shoulder.Avoid overhead lifting and overhead use of the arms. Pillows to keep from sleeping directly on the shoulders Limited lifting to less than 10 lbs. Ice or heat for relief. NSAIDs are helpful, such as alleve or motrin, be careful not to use in excess as they place burdens on the kidney. Stretching exercise help and strengthening is helpful to build endurance.  Secondary Shoulder Impingement Syndrome Rehab Ask your health care provider which exercises are safe for you. Do exercises exactly as told by your health care provider and adjust them as directed. It is normal to feel mild stretching, pulling, tightness, or discomfort as you do these exercises. Stop right away if you feel sudden pain or your pain gets worse. Do not begin these exercises until told by your health care provider. Stretching and range-of-motion exercise This exercise warms up your muscles and joints and improves the movement and flexibility of your neck and shoulder. This exercise also helps to relieve pain and stiffness. Cervical side bend 1. Using good posture, sit on a stable chair, or stand up. 2. Without moving your shoulders, slowly tilt your left / right ear toward your left / right shoulder until you feel a stretch in your neck (cervical) muscles on the other side. You should be looking straight ahead. 3. Hold for __________ seconds. 4. Slowly return to the starting position. 5. Repeat the stretch on your left / right side. Repeat  __________ times. Complete this exercise __________ times a day.   Strengthening exercises These exercises build strength and endurance in your shoulder. Endurance is the ability to use your muscles for a long time, even after they get tired. Scapular protraction, supine 1. Lie on your back on a firm surface (supine position). Hold a __________ weight in your left / right hand. 2. Raise your left / right arm straight into the air so your hand is directly above your shoulder joint. 3. Push the weight into the air so your shoulder (scapula) lifts off the surface that you are lying on. The scapula will push up or forward (protraction). Do not move your head, neck, or back. 4. Hold for __________ seconds. 5. Slowly return to the starting position. Let your muscles relax completely before you repeat this exercise. Repeat __________ times. Complete this exercise __________ times a day.   Scapular retraction 1. Sit in a stable chair without armrests, or stand up. 2. Secure an exercise band to a stable object in front of you so the band is at shoulder height. 3. Hold one end of the exercise band in each hand. Your palms should face down. 4. Squeeze your shoulder blades (scapulae) together and move your elbows slightly behind you (retraction). Do not shrug your shoulders upward while you do this. 5. Hold for __________ seconds. 6. Slowly return to the starting position. Repeat __________ times. Complete this exercise __________ times a day.   Shoulder extension with scapular retraction 1. Sit in a stable chair without armrests, or stand up. 2. Secure an exercise band to a stable  object in front of you so the band is above shoulder height. 3. Hold one end of the exercise band in each hand. 4. Straighten your elbows and lift your hands up to shoulder height. 5. Squeeze your shoulder blades together (scapular retraction) and pull your hands down to the sides of your thighs (shoulder extension). Stop when  your hands are straight down by your sides. Do not let your hands go behind your body. 6. Hold for __________ seconds. 7. Slowly return to the starting position. Repeat __________ times. Complete this exercise __________ times a day.   Shoulder abduction 1. Sit in a stable chair without armrests, or stand up. 2. If directed, hold a __________ weight in your left / right hand. 3. Start with your arms straight down. Turn your left / right hand so your palm faces in, toward your body. 4. Slowly lift your left / right hand out to your side (abduction). Do not lift your hand above shoulder height. ? Keep your arms straight. ? Avoid shrugging your shoulder while you do this movement. Keep your shoulder blade tucked down toward the middle of your back. 5. Hold for __________ seconds. 6. Slowly lower your arm, and return to the starting position. Repeat __________ times. Complete this exercise __________ times a day. This information is not intended to replace advice given to you by your health care provider. Make sure you discuss any questions you have with your health care provider. Document Revised: 03/11/2019 Document Reviewed: 12/23/2018 Elsevier Patient Education  2021 [employer].

## 2020-12-22 ENCOUNTER — Emergency Department (HOSPITAL_BASED_OUTPATIENT_CLINIC_OR_DEPARTMENT_OTHER): Payer: BC Managed Care – PPO

## 2020-12-22 ENCOUNTER — Encounter (HOSPITAL_BASED_OUTPATIENT_CLINIC_OR_DEPARTMENT_OTHER): Payer: Self-pay | Admitting: Emergency Medicine

## 2020-12-22 ENCOUNTER — Other Ambulatory Visit: Payer: Self-pay

## 2020-12-22 ENCOUNTER — Emergency Department (HOSPITAL_BASED_OUTPATIENT_CLINIC_OR_DEPARTMENT_OTHER)
Admission: EM | Admit: 2020-12-22 | Discharge: 2020-12-22 | Disposition: A | Discharge status: Home / Self Care | Payer: BC Managed Care – PPO | Attending: Emergency Medicine | Admitting: Emergency Medicine

## 2020-12-22 DIAGNOSIS — Z7951 Long term (current) use of inhaled steroids: Secondary | ICD-10-CM | POA: Diagnosis not present

## 2020-12-22 DIAGNOSIS — Z96651 Presence of right artificial knee joint: Secondary | ICD-10-CM | POA: Diagnosis not present

## 2020-12-22 DIAGNOSIS — Z7901 Long term (current) use of anticoagulants: Secondary | ICD-10-CM | POA: Insufficient documentation

## 2020-12-22 DIAGNOSIS — J4520 Mild intermittent asthma, uncomplicated: Secondary | ICD-10-CM | POA: Diagnosis not present

## 2020-12-22 DIAGNOSIS — R911 Solitary pulmonary nodule: Secondary | ICD-10-CM | POA: Diagnosis not present

## 2020-12-22 DIAGNOSIS — I10 Essential (primary) hypertension: Secondary | ICD-10-CM | POA: Insufficient documentation

## 2020-12-22 DIAGNOSIS — I2699 Other pulmonary embolism without acute cor pulmonale: Secondary | ICD-10-CM | POA: Diagnosis not present

## 2020-12-22 DIAGNOSIS — Z87891 Personal history of nicotine dependence: Secondary | ICD-10-CM | POA: Insufficient documentation

## 2020-12-22 DIAGNOSIS — R0609 Other forms of dyspnea: Secondary | ICD-10-CM | POA: Insufficient documentation

## 2020-12-22 DIAGNOSIS — R0602 Shortness of breath: Secondary | ICD-10-CM | POA: Diagnosis not present

## 2020-12-22 DIAGNOSIS — R0600 Dyspnea, unspecified: Secondary | ICD-10-CM

## 2020-12-22 DIAGNOSIS — I4892 Unspecified atrial flutter: Secondary | ICD-10-CM | POA: Insufficient documentation

## 2020-12-22 DIAGNOSIS — Z79899 Other long term (current) drug therapy: Secondary | ICD-10-CM | POA: Insufficient documentation

## 2020-12-22 LAB — CBC WITH DIFFERENTIAL/PLATELET
Abs Immature Granulocytes: 0.03 10*3/uL (ref 0.00–0.07)
Basophils Absolute: 0 10*3/uL (ref 0.0–0.1)
Basophils Relative: 0 %
Eosinophils Absolute: 0.1 10*3/uL (ref 0.0–0.5)
Eosinophils Relative: 1 %
HCT: 46 % (ref 39.0–52.0)
Hemoglobin: 15.1 g/dL (ref 13.0–17.0)
Immature Granulocytes: 0 %
Lymphocytes Relative: 18 %
Lymphs Abs: 1.6 10*3/uL (ref 0.7–4.0)
MCH: 30.6 pg (ref 26.0–34.0)
MCHC: 32.8 g/dL (ref 30.0–36.0)
MCV: 93.1 fL (ref 80.0–100.0)
Monocytes Absolute: 0.7 10*3/uL (ref 0.1–1.0)
Monocytes Relative: 8 %
Neutro Abs: 6.3 10*3/uL (ref 1.7–7.7)
Neutrophils Relative %: 73 %
Platelets: 216 10*3/uL (ref 150–400)
RBC: 4.94 MIL/uL (ref 4.22–5.81)
RDW: 12.8 % (ref 11.5–15.5)
WBC: 8.6 10*3/uL (ref 4.0–10.5)
nRBC: 0 % (ref 0.0–0.2)

## 2020-12-22 LAB — COMPREHENSIVE METABOLIC PANEL
ALT: 23 U/L (ref 0–44)
AST: 22 U/L (ref 15–41)
Albumin: 4.3 g/dL (ref 3.5–5.0)
Alkaline Phosphatase: 75 U/L (ref 38–126)
Anion gap: 8 (ref 5–15)
BUN: 25 mg/dL — ABNORMAL HIGH (ref 8–23)
CO2: 24 mmol/L (ref 22–32)
Calcium: 9.3 mg/dL (ref 8.9–10.3)
Chloride: 108 mmol/L (ref 98–111)
Creatinine, Ser: 1.26 mg/dL — ABNORMAL HIGH (ref 0.61–1.24)
GFR, Estimated: >60 mL/min (ref >60)
Glucose, Bld: 144 mg/dL — ABNORMAL HIGH (ref 70–99)
Potassium: 4 mmol/L (ref 3.5–5.1)
Sodium: 140 mmol/L (ref 135–145)
Total Bilirubin: 0.3 mg/dL (ref 0.3–1.2)
Total Protein: 7.1 g/dL (ref 6.5–8.1)

## 2020-12-22 LAB — BRAIN NATRIURETIC PEPTIDE: B Natriuretic Peptide: 64 pg/mL (ref 0.0–100.0)

## 2020-12-22 LAB — TROPONIN I (HIGH SENSITIVITY)
Troponin I (High Sensitivity): 10 ng/L (ref <18)
Troponin I (High Sensitivity): 11 ng/L (ref <18)

## 2020-12-22 LAB — D-DIMER, QUANTITATIVE: D-Dimer, Quant: 1.11 ug/mL-FEU — ABNORMAL HIGH (ref 0.00–0.50)

## 2020-12-22 MED ORDER — APIXABAN 2.5 MG PO TABS
5.0000 mg | ORAL_TABLET | Freq: Two times a day (BID) | ORAL | Status: DC
  Administered 2020-12-22: 5 mg via ORAL
  Filled 2020-12-22: qty 2

## 2020-12-22 MED ORDER — APIXABAN 5 MG PO TABS: 5.0000 mg | ORAL_TABLET | Freq: Two times a day (BID) | ORAL | 0 refills | Status: DC

## 2020-12-22 MED ORDER — METOPROLOL SUCCINATE ER 25 MG PO TB24: 25.0000 mg | ORAL_TABLET | Freq: Every day | ORAL | 0 refills | Status: DC

## 2020-12-22 MED ORDER — IOHEXOL 350 MG/ML SOLN
100.0000 mL | Freq: Once | INTRAVENOUS | Status: AC | PRN
  Administered 2020-12-22: 100 mL via INTRAVENOUS

## 2020-12-22 MED ORDER — METOPROLOL TARTRATE 25 MG PO TABS
25.0000 mg | ORAL_TABLET | Freq: Once | ORAL | Status: AC
  Administered 2020-12-22: 25 mg via ORAL
  Filled 2020-12-22: qty 1

## 2020-12-22 NOTE — Progress Notes (Signed)
ANTICOAGULATION CONSULT NOTE - Initial Consult  Pharmacy Consult for Eliquis Indication: atrial fibrillation  No Known Allergies  Patient Measurements: Height: 6\' 1"  (185.4 cm) Weight: 107 kg (236 lb) IBW/kg (Calculated) : 79.9  Vital Signs: Temp: 98.6 F (37 C) (01/22 0920) Temp Source: Oral (01/22 0920) BP: 113/96 (01/22 0918) Pulse Rate: 115 (01/22 0918)  Labs: No results for input(s): HGB, HCT, PLT, APTT, LABPROT, INR, HEPARINUNFRC, HEPRLOWMOCWT, CREATININE, CKTOTAL, CKMB, TROPONINIHS in the last 72 hours.  CrCl cannot be calculated (Patient's most recent lab result is older than the maximum 21 days allowed.).   Medical History: Past Medical History:  Diagnosis Date  . ALLERGIC RHINITIS 12/31/2007  . COUGH, CHRONIC 07/18/2007  . DEGENERATIVE JOINT DISEASE 07/18/2007  . Extrinsic asthma, unspecified 09/08/2009  . GERD 07/18/2007  . HYPERLIPIDEMIA 08/14/2010  . Hypertension   . Impaired glucose tolerance 09/23/2011  . OBESITY 07/18/2007    Assessment: 69 YOM sent by PCP d/t aflutter and SOB, hx of provoked DVT (2019) and was on Eliquis in past, since discontinued. Age 74, Wt 107kg > full dose    Goal of Therapy:  Monitor platelets by anticoagulation protocol: Yes   Plan:  Eliquis 5mg  PO BID Monitor s/s bleeding  81, PharmD Clinical Pharmacist ED Pharmacist Phone # [phone] 12/22/2020 10:08 AM

## 2020-12-22 NOTE — ED Notes (Signed)
Pt ambulatory to xray.

## 2020-12-22 NOTE — ED Provider Notes (Signed)
[facility] EMERGENCY DEPARTMENT Provider Note   CSN: [id number] Arrival date & time: 12/22/20  8250     History Chief Complaint  Patient presents with  . Shortness of Breath    [name] is a 70 y.o. male.  HPI      Reports exertional shortness of breath that has been progressive over last year Works on truck, locally, lifting, but having worsening exertional dyspnea Told doctor about symptoms and was told heart rate elevated yesterday and told to go to the ED.;  Felt ok.  No chest pain, dizziness or lightheadedness, diaphoresis No fever, no cough, nv/d, black or bloody stools  Received pfizer last year No bleeding history   Hx of DVT after knee replacement off of blood thinners  Asthma mild, htn, hlpd No fam hx of heart disease No cigarettes/drugs, occ etoh  Past Medical History:  Diagnosis Date  . ALLERGIC RHINITIS 12/31/2007  . COUGH, CHRONIC 07/18/2007  . DEGENERATIVE JOINT DISEASE 07/18/2007  . Extrinsic asthma, unspecified 09/08/2009  . GERD 07/18/2007  . HYPERLIPIDEMIA 08/14/2010  . Hypertension   . Impaired glucose tolerance 09/23/2011  . OBESITY 07/18/2007    Patient Active Problem List   Diagnosis Date Noted  . Tachycardia 12/21/2020  . DOE (dyspnea on exertion) 12/21/2020  . Anemia due to blood loss, acute 07/29/2018    Class: Acute  . DVT (deep venous thrombosis) (HCC) 07/28/2018  . Unilateral primary osteoarthritis, right knee 07/26/2018    Class: End Stage  . S/P TKR (total knee replacement) using cement, right 07/26/2018  . Flu-like symptoms 01/23/2018  . Dyspnea 01/07/2018  . HTN (hypertension) 10/30/2016  . Cough 10/30/2016  . Asthma with exacerbation 10/30/2016  . Edema 11/22/2015  . Erectile dysfunction 01/17/2015  . Corneal abrasion, right, sequela 06/27/2014  . Encounter for well adult exam with abnormal findings 09/23/2011  . Impaired glucose tolerance 09/23/2011  . Hyperlipidemia 08/14/2010  . Extrinsic asthma  09/08/2009  . ALLERGIC RHINITIS 12/31/2007  . OBESITY 07/18/2007  . GERD 07/18/2007  . DEGENERATIVE JOINT DISEASE 07/18/2007    Past Surgical History:  Procedure Laterality Date  . BACK SURGERY    . DG FEMUR LEFT  ([facility] HX)     rod placed  . TOOTH EXTRACTION    . TOTAL KNEE ARTHROPLASTY Right 07/26/2018   Procedure: RIGHT TOTAL KNEE ARTHROPLASTY;  Surgeon: Kerrin Champagne, MD;  Location: [facility] OR;  Service: Orthopedics;  Laterality: Right;       History reviewed. No pertinent family history.  Social History   Tobacco Use  . Smoking status: Former Games developer  . Smokeless tobacco: Never Used  . Tobacco comment: Marijuana 30 years  Vaping Use  . Vaping Use: Never used  Substance Use Topics  . Alcohol use: Yes    Comment: occ  . Drug use: Never    Home Medications Prior to Admission medications   Medication Sig Start Date End Date Taking? Authorizing Provider  albuterol (PROVENTIL HFA;VENTOLIN HFA) 108 (90 Base) MCG/ACT inhaler Inhale 2 puffs into the lungs every 6 (six) hours as needed for wheezing or shortness of breath. 01/07/18   Corwin Levins, MD  apixaban (ELIQUIS) 5 MG TABS tablet Take 1 tablet (5 mg total) by mouth 2 (two) times daily. 12/22/20   Alvira Monday, MD  baclofen (LIORESAL) 10 MG tablet 1 po q 8hr prn 06/21/20   Naida Sleight, PA-C  budesonide-formoterol [facility]) 160-4.5 MCG/ACT inhaler Inhale 1 puff into the lungs every evening. 03/13/20  Corwin Levins, MD  chlorpheniramine-HYDROcodone [facility]) 10-8 MG/5ML SUER Take 5 mLs by mouth every 12 (twelve) hours as needed for cough. 08/30/20   Plotnikov, Georgina Quint, MD  diclofenac sodium (VOLTAREN) 1 % GEL Apply 2 g topically 4 (four) times daily. 07/29/19   Kerrin Champagne, MD  docusate sodium (COLACE) 100 MG capsule Take 1 capsule (100 mg total) by mouth 2 (two) times daily. 08/10/18   Corwin Levins, MD  esomeprazole (NEXIUM) 40 MG capsule TAKE 1 CAPSULE DAILY BEFORE BREAKFAST 10/05/20   Corwin Levins, MD   gabapentin (NEURONTIN) 300 MG capsule Take 1 capsule (300 mg total) by mouth 2 (two) times daily. 03/13/20   Kerrin Champagne, MD  losartan (COZAAR) 100 MG tablet Take 1 tablet (100 mg total) by mouth daily. Annual appt due in March must see provider for future refills 06/25/20   Corwin Levins, MD  metoprolol succinate (TOPROL-XL) 25 MG 24 hr tablet Take 1 tablet (25 mg total) by mouth daily. 12/22/20   Alvira Monday, MD  tadalafil (CIALIS) 20 MG tablet Take 1 tablet (20 mg total) by mouth daily as needed for erectile dysfunction. 01/17/15   Corwin Levins, MD    Allergies    Patient has no known allergies.  Review of Systems   Review of Systems  Constitutional: Positive for fatigue. Negative for fever.  HENT: Negative for sore throat.   Eyes: Negative for visual disturbance.  Respiratory: Positive for shortness of breath. Negative for cough.   Cardiovascular: Negative for chest pain.  Gastrointestinal: Negative for abdominal pain, diarrhea, nausea and vomiting.  Genitourinary: Negative for difficulty urinating.  Musculoskeletal: Negative for back pain and neck stiffness.  Skin: Negative for rash.  Neurological: Negative for syncope and headaches.    Physical Exam Updated Vital Signs BP 124/90   Pulse 75   Temp 98.6 F (37 C) (Oral)   Resp 17   Ht 6\' 1"  (1.854 m)   Wt 107 kg   SpO2 97%   BMI 31.14 kg/m   Physical Exam Vitals and nursing note reviewed.  Constitutional:      General: He is not in acute distress.    Appearance: He is well-developed and well-nourished. He is not diaphoretic.  HENT:     Head: Normocephalic and atraumatic.  Eyes:     Extraocular Movements: EOM normal.     Conjunctiva/sclera: Conjunctivae normal.  Cardiovascular:     Rate and Rhythm: Regular rhythm. Tachycardia present.     Pulses: Intact distal pulses.     Heart sounds: Normal heart sounds. No murmur heard. No friction rub. No gallop.   Pulmonary:     Effort: Pulmonary effort is normal. No  respiratory distress.     Breath sounds: Examination of the left-lower field reveals decreased breath sounds. Decreased breath sounds present. No wheezing or rales.  Abdominal:     General: There is no distension.     Palpations: Abdomen is soft.     Tenderness: There is no abdominal tenderness. There is no guarding.  Musculoskeletal:        General: No edema.     Cervical back: Normal range of motion.  Skin:    General: Skin is warm and dry.  Neurological:     Mental Status: He is alert and oriented to person, place, and time.     ED Results / Procedures / Treatments   Labs (all labs ordered are listed, but only abnormal results are displayed)  Labs Reviewed  COMPREHENSIVE METABOLIC PANEL - Abnormal; Notable for the following components:      Result Value   Glucose, Bld 144 (*)    BUN 25 (*)    Creatinine, Ser 1.26 (*)    All other components within normal limits  D-DIMER, QUANTITATIVE (NOT AT [facility]) - Abnormal; Notable for the following components:   D-Dimer, Quant 1.11 (*)    All other components within normal limits  CBC WITH DIFFERENTIAL/PLATELET  BRAIN NATRIURETIC PEPTIDE  TROPONIN I (HIGH SENSITIVITY)  TROPONIN I (HIGH SENSITIVITY)    EKG EKG Interpretation  Date/Time:  Saturday December 22 2020 09:20:27 EST Ventricular Rate:  113 PR Interval:    QRS Duration: 120 QT Interval:  451 QTC Calculation: 602 R Axis:   -73 Text Interpretation: Atrial flutter RBBB and LAFB Inferior infarct, old No significant change since ECG prior today Confirmed by Alvira MondaySchlossman, Destenie Ingber ([id number]) on 12/22/2020 10:08:32 AM   Radiology DG Chest 2 View  Result Date: 12/22/2020 CLINICAL DATA:  Shortness of breath with exertion. EXAM: CHEST - 2 VIEW COMPARISON:  Chest radiograph 01/07/2018. FINDINGS: Monitoring leads overlie the patient. Stable cardiac and mediastinal contours. No consolidative pulmonary opacities. No pleural effusion or pneumothorax. Thoracic spine degenerative changes.  IMPRESSION: No active cardiopulmonary disease. Electronically Signed   By: Annia Beltrew  Davis M.D.   On: 12/22/2020 10:29   CT Angio Chest PE W and/or Wo Contrast  Result Date: 12/22/2020 CLINICAL DATA:  Shortness of breath for 6 months. DVT 2 years ago. Nonsmoker. EXAM: CT ANGIOGRAPHY CHEST WITH CONTRAST TECHNIQUE: Multidetector CT imaging of the chest was performed using the standard protocol during bolus administration of intravenous contrast. Multiplanar CT image reconstructions and MIPs were obtained to evaluate the vascular anatomy. CONTRAST:  100mL OMNIPAQUE IOHEXOL 350 MG/ML SOLN COMPARISON:  12/22/2020 chest radiograph. No prior CT. FINDINGS: Cardiovascular: The quality of this exam for evaluation of pulmonary embolism is good. No evidence of pulmonary embolism. Aortic atherosclerosis. Normal heart size, without pericardial effusion. Mediastinum/Nodes: No mediastinal or hilar adenopathy. Lungs/Pleura: No pleural fluid.  Mild centrilobular emphysema. Pulmonary nodules.  6 mm right lower lobe pulmonary nodule on 66/6. 6 mm left lower lobe pulmonary nodule on 73/6. 2 mm left lower lobe pulmonary nodule on 79/6. Upper Abdomen: Normal imaged portions of the liver, spleen, stomach, pancreas, gallbladder, adrenal glands, kidneys. Musculoskeletal: No acute osseous abnormality. Lower cervical spondylosis Review of the MIP images confirms the above findings. IMPRESSION: 1.  No evidence of pulmonary embolism. 2. Aortic atherosclerosis (ICD10-I70.0) and emphysema (ICD10-J43.9). 3. Bilateral pulmonary nodules of maximally 6 mm. Non-contrast chest CT at 6-12 months is recommended. If the nodule is stable at time of repeat CT, then future CT at 18-24 months (from today's scan) is considered optional for low-risk patients, but is recommended for high-risk patients. This recommendation follows the consensus statement: Guidelines for Management of Incidental Pulmonary Nodules Detected on CT Images: From the Fleischner Society  2017; Radiology 2017; 284:228-243. Electronically Signed   By: Jeronimo GreavesKyle  Talbot M.D.   On: 12/22/2020 11:48   XR Hand Complete Right  Result Date: 12/21/2020 AP, lateral  And oblique radiographs right wrist demonstrate severe right thumb metacarpotrapezial arthrosis changes with loss of the normal joint line due to arthrosis changes with radial subluxation of the thumb MC-C joint with sclerosis and osteophyte present this is Eaton 4 changes. There is a nondisplaced distal pole scaphiod fracture with a thin lucency londitudinally oriented and a T transverse component at the Distal 1/3 of the scaphoid there  is no displacement with minimal distal scaphoid joint line stepoff less than 0.5 mm. The bone quality of the proximal scaphoid shows hawkins sign mildly positive to the proximal pole but there is decreased bone density on the immediate adjacent part of the fracture site suggests that the area immediately proximal to fracture site as having decreased bone density suggesting intact blood supply.     Procedures Procedures (including critical care time)  Medications Ordered in ED Medications  metoprolol tartrate (LOPRESSOR) tablet 25 mg (25 mg Oral Given 12/22/20 1012)  iohexol (OMNIPAQUE) 350 MG/ML injection 100 mL (100 mLs Intravenous Contrast Given 12/22/20 1120)    ED Course  I have reviewed the triage vital signs and the nursing notes.  Pertinent labs & imaging results that were available during my care of the patient were reviewed by me and considered in my medical decision making (see chart for details).    MDM Rules/Calculators/A&P                          70yo male with history of hypertension, hyperlipidemia, impaired glucose tolerance, asthma, DVT following knee replacement no longer on anticoagulation, presents with concern for dyspnea on exertion over the last year for which he saw PCP yesterday and was found to have atrial flutter with elevated heart rate.  Presents today with atrial  flutter, not symptomatic at rest, does have exertional dyspnea but not acutely worse recently-has been present over the last year.  HR on arrival after walking 130s but improved to 90s-100s at rest.    CHADS-VASc2 score 2.  Will start eliquis for stroke prevention.    Given metoprolol orally, labs ordered for evaluation show no evidence of anemia, electrolyte abnormalities, normal troponin x2.  Ddimer positive and PE study ordered showing no evidence of PE-does show pulmonary nodule that needs follow up in 6-12 months.    Reevaluated with HR in 80s in atrial flutter, ambulated with elevation in HR to 130 but minimal increase in symptoms. Discussed with Cardiology and agree that outpatient management is appropriate.  Given rx for metoprolol, eliquis and referral to atrial fibrillation clinic. Patient discharged in stable condition with understanding of reasons to return.     Final Clinical Impression(s) / ED Diagnoses Final diagnoses:  Atrial flutter, unspecified type (HCC)  Dyspnea on exertion    Rx / DC Orders ED Discharge Orders         Ordered    Ambulatory referral to Cardiology        12/22/20 1249    apixaban (ELIQUIS) 5 MG TABS tablet  2 times daily,   Status:  Discontinued        12/22/20 1253    metoprolol succinate (TOPROL-XL) 25 MG 24 hr tablet  Daily,   Status:  Discontinued        12/22/20 1253    apixaban (ELIQUIS) 5 MG TABS tablet  2 times daily        12/22/20 1302    metoprolol succinate (TOPROL-XL) 25 MG 24 hr tablet  Daily        12/22/20 1302           Alvira Monday, MD 12/22/20 1658

## 2020-12-22 NOTE — ED Triage Notes (Signed)
Pt arrives pov with report of being sent by PCP with c/o atrial flutter. Pt endorses shob.

## 2020-12-22 NOTE — ED Notes (Signed)
Pt ambulatory without difficulty, SpO2 maintained above 93%

## 2020-12-23 ENCOUNTER — Encounter: Payer: Self-pay | Admitting: Internal Medicine

## 2020-12-23 NOTE — Assessment & Plan Note (Signed)
BP Readings from Last 3 Encounters:  12/22/20 124/90  12/21/20 118/76  12/21/20 100/62   Stable, pt to continue medical treatment  - losartan 100

## 2020-12-23 NOTE — Assessment & Plan Note (Addendum)
Advised for lab and cxr, but will not need from me if goes to ED as advised, not clear if pt will go this evening, states he may go tomorrow as he has a refigerator being delivered in the AM and has to be there for that, declines consideration for echo or stress testing for now

## 2020-12-23 NOTE — Assessment & Plan Note (Signed)
C/w a aflutter, pt advised for ED now for more urgent complete evalatuation and possible cardiology consult

## 2020-12-23 NOTE — Assessment & Plan Note (Signed)
Lab Results  Component Value Date   HGBA1C 5.5 06/15/2020   Stable, pt to continue current medical treatment - diet

## 2020-12-24 ENCOUNTER — Ambulatory Visit (HOSPITAL_COMMUNITY)
Admission: RE | Admit: 2020-12-24 | Discharge: 2020-12-24 | Disposition: A | Discharge status: Home / Self Care | Payer: BC Managed Care – PPO | Source: Ambulatory Visit | Attending: Physician Assistant | Admitting: Physician Assistant

## 2020-12-24 ENCOUNTER — Encounter (HOSPITAL_COMMUNITY): Payer: Self-pay | Admitting: Physician Assistant

## 2020-12-24 ENCOUNTER — Other Ambulatory Visit: Payer: Self-pay

## 2020-12-24 VITALS — BP 88/70 | HR 135 | Ht 73.0 in | Wt 238.0 lb

## 2020-12-24 DIAGNOSIS — I4892 Unspecified atrial flutter: Secondary | ICD-10-CM | POA: Insufficient documentation

## 2020-12-24 DIAGNOSIS — Z7901 Long term (current) use of anticoagulants: Secondary | ICD-10-CM | POA: Insufficient documentation

## 2020-12-24 DIAGNOSIS — D6869 Other thrombophilia: Secondary | ICD-10-CM | POA: Insufficient documentation

## 2020-12-24 DIAGNOSIS — E785 Hyperlipidemia, unspecified: Secondary | ICD-10-CM | POA: Diagnosis not present

## 2020-12-24 DIAGNOSIS — Z6831 Body mass index (BMI) 31.0-31.9, adult: Secondary | ICD-10-CM | POA: Insufficient documentation

## 2020-12-24 DIAGNOSIS — I10 Essential (primary) hypertension: Secondary | ICD-10-CM | POA: Insufficient documentation

## 2020-12-24 DIAGNOSIS — E669 Obesity, unspecified: Secondary | ICD-10-CM | POA: Insufficient documentation

## 2020-12-24 DIAGNOSIS — J984 Other disorders of lung: Secondary | ICD-10-CM | POA: Diagnosis not present

## 2020-12-24 DIAGNOSIS — I471 Supraventricular tachycardia: Secondary | ICD-10-CM | POA: Insufficient documentation

## 2020-12-24 NOTE — Progress Notes (Signed)
Primary Care Physician: Corwin Levins, MD Primary Cardiologist: none Primary Electrophysiologist: none Referring Physician: [facility]   [name] is a 70 y.o. male with a history of hypertension, hyperlipidemia, impaired glucose tolerance, asthma, DVT following knee replacement, and atrial flutter who presents for consultation in the [facility]. The patient was initially diagnosed with atrial flutter 12/22/20 after presenting to the ED with symptoms of progressive SOB and elevated heart rates. ECG showed rapid atrial flutter. He was started on Eliquis for a CHADS2VASC score of 2 and metoprolol for rate control. He is unaware of his heart racing but does have symptoms of fatigue which have been chronic for at least a year. He denies significant snoring or alcohol use.   Today, he denies symptoms of palpitations, chest pain, shortness of breath, orthopnea, PND, lower extremity edema, dizziness, presyncope, syncope, snoring, daytime somnolence, bleeding, or neurologic sequela. The patient is tolerating medications without difficulties and is otherwise without complaint today.    Atrial Fibrillation Risk Factors:  he does not have symptoms or diagnosis of sleep apnea. he does not have a history of rheumatic fever. he does not have a history of alcohol use. The patient does not have a history of early familial atrial fibrillation or other arrhythmias.  he has a BMI of Body mass index is 31.4 kg/m.Marland Kitchen Filed Weights   12/24/20 1539  Weight: 108 kg    No family history on file.   Atrial Fibrillation Management history:  Previous antiarrhythmic drugs: none Previous cardioversions: none Previous ablations: none CHADS2VASC score: 2 Anticoagulation history: Eliquis   Past Medical History:  Diagnosis Date  . ALLERGIC RHINITIS 12/31/2007  . COUGH, CHRONIC 07/18/2007  . DEGENERATIVE JOINT DISEASE 07/18/2007  . Extrinsic asthma, unspecified 09/08/2009   . GERD 07/18/2007  . HYPERLIPIDEMIA 08/14/2010  . Hypertension   . Impaired glucose tolerance 09/23/2011  . OBESITY 07/18/2007   Past Surgical History:  Procedure Laterality Date  . BACK SURGERY    . DG FEMUR LEFT  ([facility] HX)     rod placed  . TOOTH EXTRACTION    . TOTAL KNEE ARTHROPLASTY Right 07/26/2018   Procedure: RIGHT TOTAL KNEE ARTHROPLASTY;  Surgeon: Kerrin Champagne, MD;  Location: [facility] OR;  Service: Orthopedics;  Laterality: Right;    Current Outpatient Medications  Medication Sig Dispense Refill  . albuterol (PROVENTIL HFA;VENTOLIN HFA) 108 (90 Base) MCG/ACT inhaler Inhale 2 puffs into the lungs every 6 (six) hours as needed for wheezing or shortness of breath. 3 Inhaler 3  . apixaban (ELIQUIS) 5 MG TABS tablet Take 1 tablet (5 mg total) by mouth 2 (two) times daily. 60 tablet 0  . Bacillus Coagulans-Inulin (PROBIOTIC) 1-250 BILLION-MG CAPS Take by mouth.    . budesonide-formoterol (SYMBICORT) 160-4.5 MCG/ACT inhaler Inhale 1 puff into the lungs every evening. 10.2 g 3  . esomeprazole (NEXIUM) 40 MG capsule TAKE 1 CAPSULE DAILY BEFORE BREAKFAST 90 capsule 3  . gabapentin (NEURONTIN) 300 MG capsule Take 1 capsule (300 mg total) by mouth 2 (two) times daily. 180 capsule 4  . losartan (COZAAR) 100 MG tablet Take 1 tablet (100 mg total) by mouth daily. Annual appt due in March must see provider for future refills 90 tablet 1  . metoprolol succinate (TOPROL-XL) 25 MG 24 hr tablet Take 1 tablet (25 mg total) by mouth daily. 30 tablet 0  . Multiple Vitamins-Minerals (CENTRUM ADULTS PO) Take by mouth.    . tadalafil (CIALIS) 20 MG tablet  Take 1 tablet (20 mg total) by mouth daily as needed for erectile dysfunction. 10 tablet 11   No current facility-administered medications for this encounter.    No Known Allergies  Social History   Socioeconomic History  . Marital status: Married    Spouse name: Not on file  . Number of children: Not on file  . Years of education: Not on file  .  Highest education level: Not on file  Occupational History  . Occupation: truck driver  Tobacco Use  . Smoking status: Former Smoker  . Smokeless tobacco: Never Used  . Tobacco comment: Marijuana 30 years  Vaping Use  . Vaping Use: Never used  Substance and Sexual Activity  . Alcohol use: Yes    Alcohol/week: 2.0 - 4.0 standard drinks    Types: 2 - 4 Cans of beer per week  . Drug use: Never  . Sexual activity: Not on file  Other Topics Concern  . Not on file  Social History Narrative  . Not on file   Social Determinants of Health   Financial Resource Strain: Not on file  Food Insecurity: Not on file  Transportation Needs: Not on file  Physical Activity: Not on file  Stress: Not on file  Social Connections: Not on file  Intimate Partner Violence: Not on file     ROS- All systems are reviewed and negative except as per the HPI above.  Physical Exam: Vitals:   12/24/20 1539  BP: (!) 88/70  Pulse: (!) 135  Weight: 108 kg  Height: 6' 1" (1.854 m)    GEN- The patient is well appearing obese male, alert and oriented x 3 today.   Head- normocephalic, atraumatic Eyes-  Sclera clear, conjunctiva pink Ears- hearing intact Oropharynx- clear Neck- supple  Lungs- Clear to ausculation bilaterally, normal work of breathing Heart- Regular rate and rhythm, tachycardia, no murmurs, rubs or gallops  GI- soft, NT, ND, + BS Extremities- no clubbing, cyanosis, or edema MS- no significant deformity or atrophy Skin- no rash or lesion Psych- euthymic mood, full affect Neuro- strength and sensation are intact  Wt Readings from Last 3 Encounters:  12/24/20 108 kg  12/22/20 107 kg  12/21/20 112.9 kg    EKG today demonstrates  Atrial flutter with 2:1 block, NO STEMI Vent. rate 135 BPM PR interval 174 ms QRS duration 88 ms QT/QTc 264/396 ms  Epic records are reviewed at length today  CHA2DS2-VASc Score = 2  The patient's score is based upon: CHF History: No HTN History:  Yes Diabetes History: No Stroke History: No Vascular Disease History: No Age Score: 1 Gender Score: 0      ASSESSMENT AND PLAN: 1. Atrial flutter The patient's CHA2DS2-VASc score is 2, indicating a 2.2% annual risk of stroke.   General education about afib/flutter provided and questions answered. We also discussed his stroke risk and the risks and benefits of anticoagulation. Given his rapid rates and low BP, will plan for TEE guided DCCV after appropriate anticoagulation. Continue Eliquis 5 mg BID Continue Toprol 25 mg daily, no room in BP to titrate.   2. Secondary Hypercoagulable State (ICD10:  D68.69) The patient is at significant risk for stroke/thromboembolism based upon his CHA2DS2-VASc Score of 2.  Continue Apixaban (Eliquis).   3. Obesity Body mass index is 31.4 kg/m. Lifestyle modification was discussed at length including regular exercise and weight reduction.  4. HTN BP low today, will stop losartan to accommodate rate control.    Follow up in   the AF clinic one week post DCCV.   Jorja Loa PA-C [facility] [address] [place], [place] [address] [phone] 12/24/2020 3:48 PM

## 2020-12-24 NOTE — H&P (View-Only) (Signed)
Primary Care Physician: Corwin Levins, MD Primary Cardiologist: none Primary Electrophysiologist: none Referring Physician: [facility]   [name] is a 70 y.o. male with a history of hypertension, hyperlipidemia, impaired glucose tolerance, asthma, DVT following knee replacement, and atrial flutter who presents for consultation in the [facility]. The patient was initially diagnosed with atrial flutter 12/22/20 after presenting to the ED with symptoms of progressive SOB and elevated heart rates. ECG showed rapid atrial flutter. He was started on Eliquis for a CHADS2VASC score of 2 and metoprolol for rate control. He is unaware of his heart racing but does have symptoms of fatigue which have been chronic for at least a year. He denies significant snoring or alcohol use.   Today, he denies symptoms of palpitations, chest pain, shortness of breath, orthopnea, PND, lower extremity edema, dizziness, presyncope, syncope, snoring, daytime somnolence, bleeding, or neurologic sequela. The patient is tolerating medications without difficulties and is otherwise without complaint today.    Atrial Fibrillation Risk Factors:  he does not have symptoms or diagnosis of sleep apnea. he does not have a history of rheumatic fever. he does not have a history of alcohol use. The patient does not have a history of early familial atrial fibrillation or other arrhythmias.  he has a BMI of Body mass index is 31.4 kg/m.Marland Kitchen Filed Weights   12/24/20 1539  Weight: 108 kg    No family history on file.   Atrial Fibrillation Management history:  Previous antiarrhythmic drugs: none Previous cardioversions: none Previous ablations: none CHADS2VASC score: 2 Anticoagulation history: Eliquis   Past Medical History:  Diagnosis Date  . ALLERGIC RHINITIS 12/31/2007  . COUGH, CHRONIC 07/18/2007  . DEGENERATIVE JOINT DISEASE 07/18/2007  . Extrinsic asthma, unspecified 09/08/2009   . GERD 07/18/2007  . HYPERLIPIDEMIA 08/14/2010  . Hypertension   . Impaired glucose tolerance 09/23/2011  . OBESITY 07/18/2007   Past Surgical History:  Procedure Laterality Date  . BACK SURGERY    . DG FEMUR LEFT  ([facility] HX)     rod placed  . TOOTH EXTRACTION    . TOTAL KNEE ARTHROPLASTY Right 07/26/2018   Procedure: RIGHT TOTAL KNEE ARTHROPLASTY;  Surgeon: Kerrin Champagne, MD;  Location: [facility] OR;  Service: Orthopedics;  Laterality: Right;    Current Outpatient Medications  Medication Sig Dispense Refill  . albuterol (PROVENTIL HFA;VENTOLIN HFA) 108 (90 Base) MCG/ACT inhaler Inhale 2 puffs into the lungs every 6 (six) hours as needed for wheezing or shortness of breath. 3 Inhaler 3  . apixaban (ELIQUIS) 5 MG TABS tablet Take 1 tablet (5 mg total) by mouth 2 (two) times daily. 60 tablet 0  . Bacillus Coagulans-Inulin (PROBIOTIC) 1-250 BILLION-MG CAPS Take by mouth.    . budesonide-formoterol (SYMBICORT) 160-4.5 MCG/ACT inhaler Inhale 1 puff into the lungs every evening. 10.2 g 3  . esomeprazole (NEXIUM) 40 MG capsule TAKE 1 CAPSULE DAILY BEFORE BREAKFAST 90 capsule 3  . gabapentin (NEURONTIN) 300 MG capsule Take 1 capsule (300 mg total) by mouth 2 (two) times daily. 180 capsule 4  . losartan (COZAAR) 100 MG tablet Take 1 tablet (100 mg total) by mouth daily. Annual appt due in March must see provider for future refills 90 tablet 1  . metoprolol succinate (TOPROL-XL) 25 MG 24 hr tablet Take 1 tablet (25 mg total) by mouth daily. 30 tablet 0  . Multiple Vitamins-Minerals (CENTRUM ADULTS PO) Take by mouth.    . tadalafil (CIALIS) 20 MG tablet  Take 1 tablet (20 mg total) by mouth daily as needed for erectile dysfunction. 10 tablet 11   No current facility-administered medications for this encounter.    No Known Allergies  Social History   Socioeconomic History  . Marital status: Married    Spouse name: Not on file  . Number of children: Not on file  . Years of education: Not on file  .  Highest education level: Not on file  Occupational History  . Occupation: truck Hospital doctor  Tobacco Use  . Smoking status: Former Games developer  . Smokeless tobacco: Never Used  . Tobacco comment: Marijuana 30 years  Vaping Use  . Vaping Use: Never used  Substance and Sexual Activity  . Alcohol use: Yes    Alcohol/week: 2.0 - 4.0 standard drinks    Types: 2 - 4 Cans of beer per week  . Drug use: Never  . Sexual activity: Not on file  Other Topics Concern  . Not on file  Social History Narrative  . Not on file   Social Determinants of Health   Financial Resource Strain: Not on file  Food Insecurity: Not on file  Transportation Needs: Not on file  Physical Activity: Not on file  Stress: Not on file  Social Connections: Not on file  Intimate Partner Violence: Not on file     ROS- All systems are reviewed and negative except as per the HPI above.  Physical Exam: Vitals:   12/24/20 1539  BP: (!) 88/70  Pulse: (!) 135  Weight: 108 kg  Height: 6\' 1"  (1.854 m)    GEN- The patient is well appearing obese male, alert and oriented x 3 today.   Head- normocephalic, atraumatic Eyes-  Sclera clear, conjunctiva pink Ears- hearing intact Oropharynx- clear Neck- supple  Lungs- Clear to ausculation bilaterally, normal work of breathing Heart- Regular rate and rhythm, tachycardia, no murmurs, rubs or gallops  GI- soft, NT, ND, + BS Extremities- no clubbing, cyanosis, or edema MS- no significant deformity or atrophy Skin- no rash or lesion Psych- euthymic mood, full affect Neuro- strength and sensation are intact  Wt Readings from Last 3 Encounters:  12/24/20 108 kg  12/22/20 107 kg  12/21/20 112.9 kg    EKG today demonstrates  Atrial flutter with 2:1 block, NO STEMI Vent. rate 135 BPM PR interval 174 ms QRS duration 88 ms QT/QTc 264/396 ms  Epic records are reviewed at length today  CHA2DS2-VASc Score = 2  The patient's score is based upon: CHF History: No HTN History:  Yes Diabetes History: No Stroke History: No Vascular Disease History: No Age Score: 1 Gender Score: 0      ASSESSMENT AND PLAN: 1. Atrial flutter The patient's CHA2DS2-VASc score is 2, indicating a 2.2% annual risk of stroke.   General education about afib/flutter provided and questions answered. We also discussed his stroke risk and the risks and benefits of anticoagulation. Given his rapid rates and low BP, will plan for TEE guided DCCV after appropriate anticoagulation. Continue Eliquis 5 mg BID Continue Toprol 25 mg daily, no room in BP to titrate.   2. Secondary Hypercoagulable State (ICD10:  D68.69) The patient is at significant risk for stroke/thromboembolism based upon his CHA2DS2-VASc Score of 2.  Continue Apixaban (Eliquis).   3. Obesity Body mass index is 31.4 kg/m. Lifestyle modification was discussed at length including regular exercise and weight reduction.  4. HTN BP low today, will stop losartan to accommodate rate control.    Follow up in  the AF clinic one week post DCCV.   Jorja Loa PA-C [facility] [address] [place], [place] [address] [phone] 12/24/2020 3:48 PM

## 2020-12-24 NOTE — Patient Instructions (Addendum)
Stop Losartan 100mg     Cardioversion scheduled for Friday, January 28th  - Arrive at the January 30 and go to admitting at [facility]  - Do not eat or drink anything after midnight the night prior to your procedure.  - Take all your morning medication (except diabetic medications) with a sip of water prior to arrival.  - You will not be able to drive home after your procedure.  - Do NOT miss any doses of your blood thinner - if you should miss a dose please notify our office immediately.  - If you feel as if you go back into normal rhythm prior to scheduled cardioversion, please notify our office immediately. If your procedure is canceled in the cardioversion suite you will be charged a cancellation fee.

## 2020-12-25 ENCOUNTER — Other Ambulatory Visit (HOSPITAL_COMMUNITY)
Admission: RE | Admit: 2020-12-25 | Discharge: 2020-12-25 | Disposition: A | Discharge status: Home / Self Care | Payer: BC Managed Care – PPO | Source: Ambulatory Visit | Attending: Cardiovascular Disease | Admitting: Cardiovascular Disease

## 2020-12-25 ENCOUNTER — Ambulatory Visit (HOSPITAL_COMMUNITY): Payer: BC Managed Care – PPO | Admitting: Physician Assistant

## 2020-12-25 DIAGNOSIS — E669 Obesity, unspecified: Secondary | ICD-10-CM | POA: Diagnosis not present

## 2020-12-25 DIAGNOSIS — Z01812 Encounter for preprocedural laboratory examination: Secondary | ICD-10-CM | POA: Insufficient documentation

## 2020-12-25 DIAGNOSIS — I4892 Unspecified atrial flutter: Secondary | ICD-10-CM | POA: Diagnosis not present

## 2020-12-25 DIAGNOSIS — Z87891 Personal history of nicotine dependence: Secondary | ICD-10-CM | POA: Diagnosis not present

## 2020-12-25 DIAGNOSIS — Z20822 Contact with and (suspected) exposure to covid-19: Secondary | ICD-10-CM | POA: Insufficient documentation

## 2020-12-25 DIAGNOSIS — I4891 Unspecified atrial fibrillation: Secondary | ICD-10-CM | POA: Diagnosis not present

## 2020-12-25 DIAGNOSIS — Z79899 Other long term (current) drug therapy: Secondary | ICD-10-CM | POA: Diagnosis not present

## 2020-12-25 DIAGNOSIS — D6869 Other thrombophilia: Secondary | ICD-10-CM | POA: Diagnosis not present

## 2020-12-25 DIAGNOSIS — Z6831 Body mass index (BMI) 31.0-31.9, adult: Secondary | ICD-10-CM | POA: Diagnosis not present

## 2020-12-25 DIAGNOSIS — Z7901 Long term (current) use of anticoagulants: Secondary | ICD-10-CM | POA: Diagnosis not present

## 2020-12-25 DIAGNOSIS — I10 Essential (primary) hypertension: Secondary | ICD-10-CM | POA: Diagnosis not present

## 2020-12-25 DIAGNOSIS — Q211 Atrial septal defect: Secondary | ICD-10-CM | POA: Diagnosis not present

## 2020-12-26 ENCOUNTER — Other Ambulatory Visit (HOSPITAL_COMMUNITY): Payer: BC Managed Care – PPO

## 2020-12-26 LAB — SARS CORONAVIRUS 2 (TAT 6-24 HRS): SARS Coronavirus 2: NEGATIVE

## 2020-12-27 ENCOUNTER — Telehealth (HOSPITAL_COMMUNITY): Payer: Self-pay | Admitting: *Deleted

## 2020-12-27 NOTE — Telephone Encounter (Signed)
TEE auth # [id number]

## 2020-12-28 ENCOUNTER — Other Ambulatory Visit: Payer: Self-pay

## 2020-12-28 ENCOUNTER — Ambulatory Visit (HOSPITAL_COMMUNITY): Payer: BC Managed Care – PPO | Admitting: Anesthesiology

## 2020-12-28 ENCOUNTER — Ambulatory Visit (HOSPITAL_COMMUNITY)
Admission: RE | Admit: 2020-12-28 | Discharge: 2020-12-28 | Disposition: A | Discharge status: Home / Self Care | Payer: BC Managed Care – PPO | Attending: Cardiovascular Disease | Admitting: Cardiovascular Disease

## 2020-12-28 ENCOUNTER — Ambulatory Visit (HOSPITAL_BASED_OUTPATIENT_CLINIC_OR_DEPARTMENT_OTHER)
Admission: RE | Admit: 2020-12-28 | Discharge: 2020-12-28 | Disposition: A | Discharge status: Home / Self Care | Payer: BC Managed Care – PPO | Source: Ambulatory Visit | Attending: Physician Assistant | Admitting: Physician Assistant

## 2020-12-28 ENCOUNTER — Encounter (HOSPITAL_COMMUNITY)
Admission: RE | Disposition: A | Discharge status: Home / Self Care | Payer: Self-pay | Source: Home / Self Care | Attending: Cardiovascular Disease

## 2020-12-28 DIAGNOSIS — Z7901 Long term (current) use of anticoagulants: Secondary | ICD-10-CM | POA: Insufficient documentation

## 2020-12-28 DIAGNOSIS — Q211 Atrial septal defect: Secondary | ICD-10-CM | POA: Insufficient documentation

## 2020-12-28 DIAGNOSIS — Z79899 Other long term (current) drug therapy: Secondary | ICD-10-CM | POA: Diagnosis not present

## 2020-12-28 DIAGNOSIS — Z20822 Contact with and (suspected) exposure to covid-19: Secondary | ICD-10-CM | POA: Diagnosis not present

## 2020-12-28 DIAGNOSIS — D6869 Other thrombophilia: Secondary | ICD-10-CM | POA: Diagnosis not present

## 2020-12-28 DIAGNOSIS — I10 Essential (primary) hypertension: Secondary | ICD-10-CM | POA: Insufficient documentation

## 2020-12-28 DIAGNOSIS — M1711 Unilateral primary osteoarthritis, right knee: Secondary | ICD-10-CM | POA: Diagnosis not present

## 2020-12-28 DIAGNOSIS — I4892 Unspecified atrial flutter: Secondary | ICD-10-CM

## 2020-12-28 DIAGNOSIS — J45901 Unspecified asthma with (acute) exacerbation: Secondary | ICD-10-CM | POA: Diagnosis not present

## 2020-12-28 DIAGNOSIS — Z6831 Body mass index (BMI) 31.0-31.9, adult: Secondary | ICD-10-CM | POA: Diagnosis not present

## 2020-12-28 DIAGNOSIS — I4891 Unspecified atrial fibrillation: Secondary | ICD-10-CM | POA: Diagnosis not present

## 2020-12-28 DIAGNOSIS — E669 Obesity, unspecified: Secondary | ICD-10-CM | POA: Insufficient documentation

## 2020-12-28 DIAGNOSIS — Z87891 Personal history of nicotine dependence: Secondary | ICD-10-CM | POA: Diagnosis not present

## 2020-12-28 DIAGNOSIS — E785 Hyperlipidemia, unspecified: Secondary | ICD-10-CM | POA: Diagnosis not present

## 2020-12-28 DIAGNOSIS — I4819 Other persistent atrial fibrillation: Secondary | ICD-10-CM | POA: Diagnosis not present

## 2020-12-28 HISTORY — PX: CARDIOVERSION: SHX1299

## 2020-12-28 HISTORY — PX: TEE WITHOUT CARDIOVERSION: SHX5443

## 2020-12-28 SURGERY — ECHOCARDIOGRAM, TRANSESOPHAGEAL
Anesthesia: General

## 2020-12-28 MED ORDER — SODIUM CHLORIDE 0.9 % IV SOLN: INTRAVENOUS | Status: DC

## 2020-12-28 MED ORDER — LIDOCAINE 2% (20 MG/ML) 5 ML SYRINGE
INTRAMUSCULAR | Status: DC | PRN
  Administered 2020-12-28: 50 mg via INTRAVENOUS

## 2020-12-28 MED ORDER — PHENYLEPHRINE 40 MCG/ML (10ML) SYRINGE FOR IV PUSH (FOR BLOOD PRESSURE SUPPORT)
PREFILLED_SYRINGE | INTRAVENOUS | Status: DC | PRN
  Administered 2020-12-28 (×2): 120 ug via INTRAVENOUS

## 2020-12-28 MED ORDER — PROPOFOL 10 MG/ML IV BOLUS
INTRAVENOUS | Status: DC | PRN
  Administered 2020-12-28: 40 mg via INTRAVENOUS

## 2020-12-28 MED ORDER — PROPOFOL 500 MG/50ML IV EMUL
INTRAVENOUS | Status: DC | PRN
  Administered 2020-12-28: 50 ug/kg/min via INTRAVENOUS

## 2020-12-28 NOTE — Anesthesia Procedure Notes (Signed)
Procedure Name: MAC Date/Time: 12/28/2020 10:03 AM Performed by: Lowella Dell, CRNA Pre-anesthesia Checklist: Patient identified, Emergency Drugs available, Suction available, Patient being monitored and Timeout performed Patient Re-evaluated:Patient Re-evaluated prior to induction Oxygen Delivery Method: Nasal cannula Placement Confirmation: positive ETCO2 Dental Injury: Teeth and Oropharynx as per pre-operative assessment

## 2020-12-28 NOTE — Anesthesia Postprocedure Evaluation (Signed)
Anesthesia Post Note  Patient: [name]  Procedure(s) Performed: TRANSESOPHAGEAL ECHOCARDIOGRAM (TEE) (N/A ) CARDIOVERSION (N/A )     Patient location during evaluation: Endoscopy Anesthesia Type: General Level of consciousness: awake and alert Pain management: pain level controlled Vital Signs Assessment: post-procedure vital signs reviewed and stable Respiratory status: spontaneous breathing, nonlabored ventilation and respiratory function stable Cardiovascular status: blood pressure returned to baseline and stable Postop Assessment: no apparent nausea or vomiting Anesthetic complications: no   No complications documented.  Last Vitals:  Vitals:   12/28/20 1040 12/28/20 1050  BP: 113/86 101/78  Pulse: 65 60  Resp: 17 10  Temp:    SpO2: 98% 98%    Last Pain:  Vitals:   12/28/20 1050  TempSrc:   PainSc: 0-No pain                 Reyanna Baley,W. EDMOND

## 2020-12-28 NOTE — Interval H&P Note (Signed)
History and Physical Interval Note:  12/28/2020 10:04 AM  [name]  has presented today for surgery, with the diagnosis of AFIB.  The various methods of treatment have been discussed with the patient and family. After consideration of risks, benefits and other options for treatment, the patient has consented to  Procedure(s): TRANSESOPHAGEAL ECHOCARDIOGRAM (TEE) (N/A) CARDIOVERSION (N/A) as a surgical intervention.  The patient's history has been reviewed, patient examined, no change in status, stable for surgery.  I have reviewed the patient's chart and labs.  Questions were answered to the patient's satisfaction.     Chilton Si, MD

## 2020-12-28 NOTE — Transfer of Care (Signed)
Immediate Anesthesia Transfer of Care Note  Patient: [name]  Procedure(s) Performed: TRANSESOPHAGEAL ECHOCARDIOGRAM (TEE) (N/A ) CARDIOVERSION (N/A )  Patient Location: PACU and Endoscopy Unit  Anesthesia Type:MAC  Level of Consciousness: awake and patient cooperative  Airway & Oxygen Therapy: Patient Spontanous Breathing  Post-op Assessment: Report given to RN and Post -op Vital signs reviewed and stable  Post vital signs: Reviewed and stable  Last Vitals:  Vitals Value Taken Time  BP 109/70 12/28/20 1040  Temp 36.5 C 12/28/20 1032  Pulse 62 12/28/20 1041  Resp 13 12/28/20 1041  SpO2 97 % 12/28/20 1041  Vitals shown include unvalidated device data.  Last Pain:  Vitals:   12/28/20 1040  TempSrc:   PainSc: 0-No pain         Complications: No complications documented.

## 2020-12-28 NOTE — Progress Notes (Signed)
  Echocardiogram Echocardiogram Transesophageal has been performed.  Pieter Partridge 12/28/2020, 10:39 AM

## 2020-12-28 NOTE — Discharge Instructions (Signed)

## 2020-12-28 NOTE — Anesthesia Preprocedure Evaluation (Signed)
Anesthesia Evaluation  Patient identified by MRN, date of birth, ID band Patient awake    Reviewed: Allergy & Precautions, H&P , NPO status , Patient's Chart, lab work & pertinent test results, reviewed documented beta blocker date and time   Airway Mallampati: II  TM Distance: >3 FB Neck ROM: Full    Dental no notable dental hx. (+) Teeth Intact, Dental Advisory Given   Pulmonary asthma , former smoker,    Pulmonary exam normal breath sounds clear to auscultation       Cardiovascular hypertension, Pt. on medications and Pt. on home beta blockers + DOE  + dysrhythmias Atrial Fibrillation  Rhythm:Irregular Rate:Tachycardia     Neuro/Psych negative neurological ROS  negative psych ROS   GI/Hepatic Neg liver ROS, GERD  Medicated,  Endo/Other  negative endocrine ROS  Renal/GU negative Renal ROS  negative genitourinary   Musculoskeletal  (+) Arthritis ,   Abdominal   Peds  Hematology  (+) Blood dyscrasia, anemia ,   Anesthesia Other Findings   Reproductive/Obstetrics negative OB ROS                             Anesthesia Physical Anesthesia Plan  ASA: III  Anesthesia Plan: General   Post-op Pain Management:    Induction: Intravenous  PONV Risk Score and Plan: 2 and Propofol infusion and Treatment may vary due to age or medical condition  Airway Management Planned: Nasal Cannula  Additional Equipment:   Intra-op Plan:   Post-operative Plan:   Informed Consent: I have reviewed the patients History and Physical, chart, labs and discussed the procedure including the risks, benefits and alternatives for the proposed anesthesia with the patient or authorized representative who has indicated his/her understanding and acceptance.     Dental advisory given  Plan Discussed with: CRNA  Anesthesia Plan Comments:         Anesthesia Quick Evaluation

## 2020-12-28 NOTE — CV Procedure (Signed)
Brief TEE Note  LVEF 20%.  Global hypokinesis LA severely enlarged Small secundum ASD noted with continuous L-->R flow No LA/LAA thrombus or mass Trivial MR, TR, PR  For additional details see full report.  Electrical Cardioversion Procedure Note [name] [id number] Jan 27, 1951  Procedure: Electrical Cardioversion Indications:  Atrial Fibrillation  Procedure Details Consent: Risks of procedure as well as the alternatives and risks of each were explained to the (patient/caregiver).  Consent for procedure obtained. Time Out: Verified patient identification, verified procedure, site/side was marked, verified correct patient position, special equipment/implants available, medications/allergies/relevent history reviewed, required imaging and test results available.  Performed  Patient placed on cardiac monitor, pulse oximetry, supplemental oxygen as necessary.  Sedation given: propofol Pacer pads placed anterior and posterior chest.  Cardioverted 1 time(s).  Cardioverted at 150J.  Evaluation Findings: Post procedure EKG shows: NSR Complications: None Patient did tolerate procedure well.   Chilton Si, MD 12/28/2020, 10:30 AM

## 2020-12-30 ENCOUNTER — Encounter (HOSPITAL_COMMUNITY): Payer: Self-pay | Admitting: Cardiovascular Disease

## 2021-01-03 ENCOUNTER — Ambulatory Visit (HOSPITAL_COMMUNITY): Payer: BC Managed Care – PPO | Admitting: Physician Assistant

## 2021-01-04 ENCOUNTER — Other Ambulatory Visit: Payer: Self-pay

## 2021-01-04 ENCOUNTER — Ambulatory Visit: Payer: BC Managed Care – PPO | Admitting: Cardiovascular Disease

## 2021-01-04 ENCOUNTER — Encounter: Payer: Self-pay | Admitting: Adult Health

## 2021-01-04 ENCOUNTER — Ambulatory Visit (HOSPITAL_COMMUNITY): Payer: BC Managed Care – PPO | Admitting: Physician Assistant

## 2021-01-04 ENCOUNTER — Ambulatory Visit: Payer: BC Managed Care – PPO | Admitting: Adult Health

## 2021-01-04 VITALS — BP 130/70 | HR 72 | Ht 72.0 in | Wt 238.0 lb

## 2021-01-04 DIAGNOSIS — I519 Heart disease, unspecified: Secondary | ICD-10-CM | POA: Diagnosis not present

## 2021-01-04 DIAGNOSIS — I4891 Unspecified atrial fibrillation: Secondary | ICD-10-CM

## 2021-01-04 DIAGNOSIS — I10 Essential (primary) hypertension: Secondary | ICD-10-CM

## 2021-01-04 DIAGNOSIS — I43 Cardiomyopathy in diseases classified elsewhere: Secondary | ICD-10-CM | POA: Diagnosis not present

## 2021-01-04 NOTE — Progress Notes (Signed)
Cardiology Office Note   Date:  01/04/2021   ID:  Jake, Fuhrmann [birth year], MRN [id number]  PCP:  Corwin Levins, MD  Cardiologist: Dr. Duke Salvia  No chief complaint on file.    History of Present Illness: [name] is a 70 y.o. male who presents for history of hypertension, hyperlipidemia, impaired glucose tolerance, asthma, DVT following knee replacement, and atrial flutter who presents for consultation in the [facility]. The patient was initially diagnosed with atrial flutter 12/22/20 after presenting to the ED with symptoms of progressive SOB and elevated heart rates.   ECG showed rapid atrial flutter. He was started on Eliquis for a CHADS2VASC score of 2 and metoprolol for rate control. He is unaware of his heart racing but does have symptoms of fatigue which have been chronic for at least a year. He denies significant snoring or alcohol use.   He underwent TEE DCCV by Dr. Duke Salvia on 12/28/2020 which revealed LVEF 20%.  Global hypokinesis LA severely enlarged Small secundum ASD noted with continuous L-->R flow No LA/LAA thrombus or mass Trivial MR, TR, PR  He was converted to NSR after on shock using 150 j.   Seeing the patient today he explains that he was taken off of losartan and placed on metoprolol post cardioversion although there is no documentation of this seen on review of charts.  He remains on apixaban 5 mg daily.  He states his breathing status has improved significantly.  He is curious to know what his heart function and strength are now after having had the cardioversion.  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  He offers no complaints and chest pain, bleeding, excessive bruising, or excessive fatigue.   Past Medical History:  Diagnosis Date  . ALLERGIC RHINITIS 12/31/2007  . COUGH, CHRONIC 07/18/2007  . DEGENERATIVE JOINT DISEASE 07/18/2007  . Extrinsic asthma, unspecified 09/08/2009  . GERD 07/18/2007   . HYPERLIPIDEMIA 08/14/2010  . Hypertension   . Impaired glucose tolerance 09/23/2011  . OBESITY 07/18/2007    Past Surgical History:  Procedure Laterality Date  . BACK SURGERY    . CARDIOVERSION N/A 12/28/2020   Procedure: CARDIOVERSION;  Surgeon: Chilton Si, MD;  Location[facility] ENDOSCOPY;  Service: Cardiovascular;  Laterality: N/A;  . DG FEMUR LEFT  ([facility] HX)     rod placed  . TEE WITHOUT CARDIOVERSION N/A 12/28/2020   Procedure: TRANSESOPHAGEAL ECHOCARDIOGRAM (TEE);  Surgeon: Chilton Si, MD;  Location[facility] ENDOSCOPY;  Service: Cardiovascular;  Laterality: N/A;  . TOOTH EXTRACTION    . TOTAL KNEE ARTHROPLASTY Right 07/26/2018   Procedure: RIGHT TOTAL KNEE ARTHROPLASTY;  Surgeon: Kerrin Champagne, MD;  Location: [facility] OR;  Service: Orthopedics;  Laterality: Right;     Current Outpatient Medications  Medication Sig Dispense Refill  . albuterol (PROVENTIL HFA;VENTOLIN HFA) 108 (90 Base) MCG/ACT inhaler Inhale 2 puffs into the lungs every 6 (six) hours as needed for wheezing or shortness of breath. 3 Inhaler 3  . apixaban (ELIQUIS) 5 MG TABS tablet Take 1 tablet (5 mg total) by mouth 2 (two) times daily. 60 tablet 0  . baclofen (LIORESAL) 10 MG tablet Take 10 mg by mouth daily as needed for muscle spasms.    . budesonide-formoterol (SYMBICORT) 80-4.5 MCG/ACT inhaler Inhale 2 puffs into the lungs 2 (two) times daily. 1-2 puffs Daily    . diclofenac Sodium (VOLTAREN) 1 % GEL Apply 2 g topically daily as needed (pain).    Marland Kitchen  esomeprazole (NEXIUM) 40 MG capsule Take 40 mg by mouth daily at 12 noon. Take 1 Daily    . gabapentin (NEURONTIN) 300 MG capsule Take 300 mg by mouth at bedtime. 1 capsule at bedtime    . metoprolol succinate (TOPROL-XL) 25 MG 24 hr tablet Take 1 tablet (25 mg total) by mouth daily. 30 tablet 0  . Multiple Vitamins-Minerals (CENTRUM ADULTS PO) Take by mouth.     No current facility-administered medications for this visit.    Allergies:   Patient has no known  allergies.    Social History:  The patient  reports that he has quit smoking. He has never used smokeless tobacco. He reports current alcohol use of about 2.0 - 4.0 standard drinks of alcohol per week. He reports that he does not use drugs.   Family History:  The patient's family history is not on file.    ROS: All other systems are reviewed and negative. Unless otherwise mentioned in H&P    PHYSICAL EXAM: VS:  BP 130/70   Pulse 72   Ht 6' (1.829 m)   Wt 238 lb (108 kg)   SpO2 97%   BMI 32.28 kg/m  , BMI Body mass index is 32.28 kg/m. GEN: Well nourished, well developed, in no acute distress HEENT: normal Neck: no JVD, carotid bruits, or masses Cardiac: RRR; 1/6 systolic murmurs, rubs, or gallops,no edema  Respiratory:  Clear to auscultation bilaterally, normal work of breathing GI: soft, nontender, nondistended, + BS MS: no deformity or atrophy.  Right arm splint in place. Skin: warm and dry, no rash Neuro:  Strength and sensation are intact Psych: euthymic mood, full affect   EKG: Normal sinus rhythm heart rate of 72 bpm.  Recent Labs: 06/15/2020: TSH 1.29 12/22/2020: ALT 23; B Natriuretic Peptide 64.0; BUN 25; Creatinine, Ser 1.26; Hemoglobin 15.1; Platelets 216; Potassium 4.0; Sodium 140    Lipid Panel    Component Value Date/Time   CHOL 182 06/15/2020 1602   TRIG 125 06/15/2020 1602   HDL 49 06/15/2020 1602   CHOLHDL 3.7 06/15/2020 1602   VLDL 22.6 02/10/2019 1640   LDLCALC 109 (H) 06/15/2020 1602   LDLDIRECT 132.9 09/23/2011 1557      Wt Readings from Last 3 Encounters:  01/04/21 238 lb (108 kg)  12/28/20 234 lb (106.1 kg)  12/24/20 238 lb (108 kg)      Other studies Reviewed: 1. Left ventricular ejection fraction, by estimation, is <20%. The left  ventricle has severely decreased function. The left ventricle demonstrates  global hypokinesis.  2. Right ventricular systolic function is moderately reduced. The right  ventricular size is normal.  3.  No left atrial/left atrial appendage thrombus was detected. The LAA  emptying velocity was 54 cm/s.  4. The mitral valve is normal in structure. Trivial mitral valve  regurgitation. No evidence of mitral stenosis.  5. The aortic valve is tricuspid. Aortic valve regurgitation is not  visualized. No aortic stenosis is present.  6. There is borderline dilatation of the ascending aorta, measuring 36  mm.  7. There is a small secundum atrial septal defect with predominantly left  to right shunting across the atrial septum.    ASSESSMENT AND PLAN:  1.  Paroxysmal atrial fibrillation: Status post TEE/DCCV.  He remains in normal sinus rhythm.  He is on Eliquis 5 mg twice daily.  He will need to remain on this for 4 weeks before discontinuing.  He denies any bleeding issues.  He continues on metoprolol  for rate control. CHADS VASC Score 2.  2.  Hypertension: He has been taken off of losartan and has been changed to metoprolol status post cardioversion.  Blood pressure is well controlled.  Will not make any changes at this time.  3.  Systolic dysfunction: Noted during TEE prior to cardioversion.  We will repeat echocardiogram for evaluation of status of LV function now that he is back in normal sinus rhythm.  Current medicines are reviewed at length with the patient today.  I have spent 30 minutes dedicated to the care of this patient on the date of this encounter to include pre-visit review of records, assessment, management and diagnostic testing,with shared decision making.  Labs/ tests ordered today include: None   [name]. [name], ANP, AACC   01/04/2021 4:27 PM    [facility] [address] Office [phone] Fax [phone]  Notice: This dictation was prepared with Dragon dictation along with smaller phrase technology. Any transcriptional errors that result from this process are unintentional and may not be corrected upon review.

## 2021-01-04 NOTE — Patient Instructions (Signed)
Medication Instructions:  Continue current medications  *If you need a refill on your cardiac medications before your next appointment, please call your pharmacy*   Lab Work: None Ordered   Testing/Procedures: Your physician has requested that you have an echocardiogram. Echocardiography is a painless test that uses sound waves to create images of your heart. It provides your doctor with information about the size and shape of your heart and how well your heart's chambers and valves are working. This procedure takes approximately one hour. There are no restrictions for this procedure.   Follow-Up: At [facility], you and your health needs are our priority.  As part of our continuing mission to provide you with exceptional heart care, we have created designated Provider Care Teams.  These Care Teams include your primary Cardiologist (physician) and Advanced Practice Providers (APPs -  Physician Assistants and Nurse Practitioners) who all work together to provide you with the care you need, when you need it.  We recommend signing up for the patient portal called "MyChart".  Sign up information is provided on this After Visit Summary.  MyChart is used to connect with patients for Virtual Visits (Telemedicine).  Patients are able to view lab/test results, encounter notes, upcoming appointments, etc.  Non-urgent messages can be sent to your provider as well.   To learn more about what you can do with MyChart, go to [email or web address].    Your next appointment:   1 month(s)  The format for your next appointment:   In Person  Provider:   Chilton Si, MD

## 2021-01-07 ENCOUNTER — Ambulatory Visit (HOSPITAL_COMMUNITY)
Admission: RE | Admit: 2021-01-07 | Discharge: 2021-01-07 | Disposition: A | Discharge status: Home / Self Care | Payer: BC Managed Care – PPO | Source: Ambulatory Visit | Attending: Adult Health | Admitting: Adult Health

## 2021-01-07 ENCOUNTER — Other Ambulatory Visit: Payer: Self-pay

## 2021-01-07 DIAGNOSIS — I519 Heart disease, unspecified: Secondary | ICD-10-CM | POA: Insufficient documentation

## 2021-01-07 DIAGNOSIS — I4891 Unspecified atrial fibrillation: Secondary | ICD-10-CM | POA: Diagnosis not present

## 2021-01-07 LAB — ECHOCARDIOGRAM COMPLETE
Area-P 1/2: 2.83 cm2
S' Lateral: 4.3 cm

## 2021-01-07 NOTE — Progress Notes (Signed)
  Echocardiogram 2D Echocardiogram has been performed.  [name] 01/07/2021, 3:53 PM

## 2021-01-09 NOTE — Addendum Note (Signed)
Addended by: Casper Harrison on: 01/09/2021 09:01 AM   Modules accepted: Orders

## 2021-01-10 ENCOUNTER — Other Ambulatory Visit: Payer: Self-pay

## 2021-01-10 ENCOUNTER — Ambulatory Visit
Admission: RE | Admit: 2021-01-10 | Discharge: 2021-01-10 | Disposition: A | Discharge status: Home / Self Care | Payer: BC Managed Care – PPO | Source: Ambulatory Visit | Attending: Specialist | Admitting: Specialist

## 2021-01-10 DIAGNOSIS — M19031 Primary osteoarthritis, right wrist: Secondary | ICD-10-CM | POA: Diagnosis not present

## 2021-01-10 DIAGNOSIS — S63091A Other subluxation of right wrist and hand, initial encounter: Secondary | ICD-10-CM | POA: Diagnosis not present

## 2021-01-10 DIAGNOSIS — S63511A Sprain of carpal joint of right wrist, initial encounter: Secondary | ICD-10-CM | POA: Diagnosis not present

## 2021-01-10 DIAGNOSIS — S62014A Nondisplaced fracture of distal pole of navicular [scaphoid] bone of right wrist, initial encounter for closed fracture: Secondary | ICD-10-CM

## 2021-01-10 DIAGNOSIS — G8929 Other chronic pain: Secondary | ICD-10-CM | POA: Diagnosis not present

## 2021-01-17 ENCOUNTER — Telehealth: Payer: Self-pay | Admitting: Adult Health

## 2021-01-17 MED ORDER — METOPROLOL SUCCINATE ER 25 MG PO TB24: 25.0000 mg | ORAL_TABLET | Freq: Every day | ORAL | 6 refills | Status: DC

## 2021-01-17 NOTE — Telephone Encounter (Signed)
Prescription sent to pharmacy. Called pt to inform him of this. Pt verbalizes understanding and is appreciative of this call.

## 2021-01-17 NOTE — Telephone Encounter (Signed)
°*  STAT* If patient is at the pharmacy, call can be transferred to refill team.   1. Which medications need to be refilled? (please list name of each medication and dose if known)  metoprolol succinate (TOPROL-XL) 25 MG 24 hr tablet  2. Which pharmacy/location (including street and city if local pharmacy) is medication to be sent to? [id number] - [place], [place] - [address]  3. Do they need a 30 day or 90 day supply? 30  Patient is out of medication   Patient has appt 02/01/21 at 3:45 pm. [facility].

## 2021-01-21 ENCOUNTER — Encounter: Payer: Self-pay | Admitting: Specialist

## 2021-01-21 ENCOUNTER — Other Ambulatory Visit: Payer: Self-pay

## 2021-01-21 ENCOUNTER — Ambulatory Visit: Payer: BC Managed Care – PPO | Admitting: Specialist

## 2021-01-21 VITALS — BP 112/73 | HR 86 | Ht 72.0 in | Wt 238.0 lb

## 2021-01-21 DIAGNOSIS — M19011 Primary osteoarthritis, right shoulder: Secondary | ICD-10-CM

## 2021-01-21 DIAGNOSIS — M1811 Unilateral primary osteoarthritis of first carpometacarpal joint, right hand: Secondary | ICD-10-CM | POA: Diagnosis not present

## 2021-01-21 DIAGNOSIS — M25531 Pain in right wrist: Secondary | ICD-10-CM

## 2021-01-21 DIAGNOSIS — S638X1S Sprain of other part of right wrist and hand, sequela: Secondary | ICD-10-CM

## 2021-01-21 MED ORDER — BUPIVACAINE HCL 0.25 % IJ SOLN
2.0000 mL | INTRAMUSCULAR | Status: AC | PRN
  Administered 2021-01-21: 2 mL via INTRA_ARTICULAR

## 2021-01-21 MED ORDER — METHYLPREDNISOLONE ACETATE 40 MG/ML IJ SUSP
40.0000 mg | INTRAMUSCULAR | Status: AC | PRN
  Administered 2021-01-21: 40 mg via INTRA_ARTICULAR

## 2021-01-21 MED ORDER — LIDOCAINE HCL 1 % IJ SOLN
2.0000 mL | INTRAMUSCULAR | Status: AC | PRN
  Administered 2021-01-21: 2 mL

## 2021-01-21 NOTE — Patient Instructions (Signed)
Plan: If you don't receive a call in the next 1-2 weeks please call and we will be sure you get a visit with a  Hand specialist concerning your right hand scapholunate ligament. The right shoulder has severe osteoarthritis and will likely require a replacement in the future when you are No longer performing heavy duty work.  Heat, hot showers, arthritis meds shoulder be avoided as you are on eloquis

## 2021-01-21 NOTE — Progress Notes (Signed)
Office Visit Note   Patient: [name]           Date of Birth: 08/22/1951           MRN: [id number] Visit Date: 01/21/2021              Requested by: Corwin Levins, MD [address] [place],  [place] [address] PCP: Corwin Levins, MD   Assessment & Plan: Visit Diagnoses:  1. Pain in right wrist   2. Tear of right scapholunate ligament, sequela   3. Localized primary osteoarthritis of carpometacarpal joint of right thumb   4. Primary osteoarthritis, right shoulder     Plan: If you don't receive a call in the next 1-2 weeks please call and we will be sure you get a visit with a  Hand specialist concerning your right hand scapholunate ligament. The right shoulder has severe osteoarthritis and will likely require a replacement in the future when you are No longer performing heavy duty work.  Heat, hot showers, arthritis meds shoulder be avoided as you are on eloquis.  Follow-Up Instructions: No follow-ups on file.   Orders:  Orders Placed This Encounter  Procedures  . Large Joint Inj: R glenohumeral  . Ambulatory referral to Orthopedic Surgery   No orders of the defined types were placed in this encounter.     Procedures: Large Joint Inj: R glenohumeral on 01/21/2021 4:06 PM Indications: pain Details: 25 G 1.5 in needle, anterolateral approach  Arthrogram: No  Medications: 40 mg methylPREDNISolone acetate 40 MG/ML; 2 mL lidocaine 1 %; 2 mL bupivacaine 0.25 % Outcome: tolerated well, no immediate complications Procedure, treatment alternatives, risks and benefits explained, specific risks discussed. Consent was given by the patient. Immediately prior to procedure a time out was called to verify the correct patient, procedure, equipment, support staff and site/side marked as required. Patient was prepped and draped in the usual sterile fashion.       Clinical Data: No additional findings.   Subjective: Chief Complaint  Patient presents with  . Right Wrist  - Follow-up    MRI review    HPI  Review of Systems   Objective: Vital Signs: BP 112/73   Pulse 86   Ht 6' (1.829 m)   Wt 238 lb (108 kg)   BMI 32.28 kg/m   Physical Exam Constitutional:      Appearance: He is well-developed and well-nourished.  HENT:     Head: Normocephalic and atraumatic.  Eyes:     Extraocular Movements: EOM normal.     Pupils: Pupils are equal, round, and reactive to light.  Pulmonary:     Effort: Pulmonary effort is normal.     Breath sounds: Normal breath sounds.  Abdominal:     General: Bowel sounds are normal.     Palpations: Abdomen is soft.  Musculoskeletal:     Cervical back: Normal range of motion and neck supple.  Skin:    General: Skin is warm and dry.  Neurological:     Mental Status: He is alert and oriented to person, place, and time.  Psychiatric:        Mood and Affect: Mood and affect normal.        Behavior: Behavior normal.        Thought Content: Thought content normal.        Judgment: Judgment normal.     Right Hand Exam   Tenderness  The patient is experiencing tenderness in the palmar  area, dorsal area and radial area.   Right Shoulder Exam   Tenderness  The patient is experiencing tenderness in the acromion and biceps tendon.  Range of Motion  Active abduction: abnormal  Passive abduction: abnormal  External rotation: abnormal  Forward flexion: abnormal   Muscle Strength  Abduction: 5/5  Internal rotation: 5/5  External rotation: 5/5  Supraspinatus: 5/5  Subscapularis: 5/5  Biceps: 5/5   Tests  Impingement: positive  Other  Erythema: absent Scars: absent Sensation: normal Pulse: present   Left Shoulder Exam   Other  Erythema: absent Scars: absent Sensation: normal       Specialty Comments:  No specialty comments available.  Imaging: No results found.   PMFS History: Patient Active Problem List   Diagnosis Date Noted  . Unilateral primary osteoarthritis, right knee  07/26/2018    Priority: High    Class: End Stage  . Anemia due to blood loss, acute 07/29/2018    Priority: Medium    Class: Acute  . Persistent atrial fibrillation (HCC)   . Atrial flutter (HCC) 12/24/2020  . Secondary hypercoagulable state (HCC) 12/24/2020  . Tachycardia 12/21/2020  . DOE (dyspnea on exertion) 12/21/2020  . DVT (deep venous thrombosis) (HCC) 07/28/2018  . S/P TKR (total knee replacement) using cement, right 07/26/2018  . Flu-like symptoms 01/23/2018  . Dyspnea 01/07/2018  . HTN (hypertension) 10/30/2016  . Cough 10/30/2016  . Asthma with exacerbation 10/30/2016  . Edema 11/22/2015  . Erectile dysfunction 01/17/2015  . Corneal abrasion, right, sequela 06/27/2014  . Encounter for well adult exam with abnormal findings 09/23/2011  . Impaired glucose tolerance 09/23/2011  . Hyperlipidemia 08/14/2010  . Extrinsic asthma 09/08/2009  . ALLERGIC RHINITIS 12/31/2007  . OBESITY 07/18/2007  . GERD 07/18/2007  . DEGENERATIVE JOINT DISEASE 07/18/2007   Past Medical History:  Diagnosis Date  . ALLERGIC RHINITIS 12/31/2007  . COUGH, CHRONIC 07/18/2007  . DEGENERATIVE JOINT DISEASE 07/18/2007  . Extrinsic asthma, unspecified 09/08/2009  . GERD 07/18/2007  . HYPERLIPIDEMIA 08/14/2010  . Hypertension   . Impaired glucose tolerance 09/23/2011  . OBESITY 07/18/2007    History reviewed. No pertinent family history.  Past Surgical History:  Procedure Laterality Date  . BACK SURGERY    . CARDIOVERSION N/A 12/28/2020   Procedure: CARDIOVERSION;  Surgeon: Chilton Si, MD;  Location: [facility] ENDOSCOPY;  Service: Cardiovascular;  Laterality: N/A;  . DG FEMUR LEFT  ([facility] HX)     rod placed  . TEE WITHOUT CARDIOVERSION N/A 12/28/2020   Procedure: TRANSESOPHAGEAL ECHOCARDIOGRAM (TEE);  Surgeon: Chilton Si, MD;  Location: [facility] ENDOSCOPY;  Service: Cardiovascular;  Laterality: N/A;  . TOOTH EXTRACTION    . TOTAL KNEE ARTHROPLASTY Right 07/26/2018   Procedure: RIGHT TOTAL KNEE  ARTHROPLASTY;  Surgeon: Kerrin Champagne, MD;  Location: [facility] OR;  Service: Orthopedics;  Laterality: Right;   Social History   Occupational History  . Occupation: truck Hospital doctor  Tobacco Use  . Smoking status: Former Games developer  . Smokeless tobacco: Never Used  . Tobacco comment: Marijuana 30 years  Vaping Use  . Vaping Use: Never used  Substance and Sexual Activity  . Alcohol use: Yes    Alcohol/week: 2.0 - 4.0 standard drinks    Types: 2 - 4 Cans of beer per week  . Drug use: Never  . Sexual activity: Not on file

## 2021-01-24 ENCOUNTER — Ambulatory Visit: Payer: BC Managed Care – PPO | Admitting: Specialist

## 2021-02-01 ENCOUNTER — Encounter: Payer: Self-pay | Admitting: Adult Health

## 2021-02-01 ENCOUNTER — Ambulatory Visit (INDEPENDENT_AMBULATORY_CARE_PROVIDER_SITE_OTHER): Payer: BC Managed Care – PPO | Admitting: Adult Health

## 2021-02-01 ENCOUNTER — Other Ambulatory Visit: Payer: Self-pay

## 2021-02-01 VITALS — BP 122/78 | HR 67 | Ht 73.0 in | Wt 239.2 lb

## 2021-02-01 DIAGNOSIS — I4819 Other persistent atrial fibrillation: Secondary | ICD-10-CM

## 2021-02-01 MED ORDER — METOPROLOL SUCCINATE ER 25 MG PO TB24: 25.0000 mg | ORAL_TABLET | Freq: Every day | ORAL | 3 refills | Status: DC

## 2021-02-01 NOTE — Progress Notes (Signed)
Cardiology Office Note   Date:  02/01/2021   ID:  [name], [name] 1951-04-30, MRN [id number]  PCP:  Joseph Levins, MD  Cardiologist: Dr. Duke [name] CC: Follow up    History of Present Illness: [name] is a 70 y.o. male who presents for close follow-up after being seen on 01/04/2021. He has a history of paroxysmal atrial fibrillation status post TEE/DCCV.  He remained in normal sinus rhythm when seen last.  He continued on Eliquis 5 mg twice daily and was to continue this for 4 weeks before discontinuing.  He has a CHADS Vasc Score of 2, hypertension and CHF.  On last office visit we repeated echocardiogram for evaluation of status of LV function since he was back in normal sinus rhythm to quantify his CHADS Vasc Score before feeling safe to stop.  Echo was completed on 01/07/2021 which revealed normal LV systolic function with an EF of 50%, LV demonstrated global hypokinesis with grade 1 diastolic dysfunction.  RV systolic function was normal.  See report below.  He is without complaint. He has not had any further complaints of palpitations, no issues with bleeding. Medically compliant.   Past Medical History:  Diagnosis Date  . ALLERGIC RHINITIS 12/31/2007  . COUGH, CHRONIC 07/18/2007  . DEGENERATIVE JOINT DISEASE 07/18/2007  . Extrinsic asthma, unspecified 09/08/2009  . GERD 07/18/2007  . HYPERLIPIDEMIA 08/14/2010  . Hypertension   . Impaired glucose tolerance 09/23/2011  . OBESITY 07/18/2007    Past Surgical History:  Procedure Laterality Date  . BACK SURGERY    . CARDIOVERSION N/A 12/28/2020   Procedure: CARDIOVERSION;  Surgeon: Chilton Si, MD;  Location[facility] ENDOSCOPY;  Service: Cardiovascular;  Laterality: N/A;  . DG FEMUR LEFT  ([facility] HX)     rod placed  . TEE WITHOUT CARDIOVERSION N/A 12/28/2020   Procedure: TRANSESOPHAGEAL ECHOCARDIOGRAM (TEE);  Surgeon: Chilton Si, MD;  Location: [facility] ENDOSCOPY;  Service: Cardiovascular;  Laterality: N/A;  . TOOTH EXTRACTION     . TOTAL KNEE ARTHROPLASTY Right 07/26/2018   Procedure: RIGHT TOTAL KNEE ARTHROPLASTY;  Surgeon: Kerrin Champagne, MD;  Location: [facility] OR;  Service: Orthopedics;  Laterality: Right;     Current Outpatient Medications  Medication Sig Dispense Refill  . albuterol (PROVENTIL HFA;VENTOLIN HFA) 108 (90 Base) MCG/ACT inhaler Inhale 2 puffs into the lungs every 6 (six) hours as needed for wheezing or shortness of breath. 3 Inhaler 3  . baclofen (LIORESAL) 10 MG tablet Take 10 mg by mouth daily as needed for muscle spasms.    . budesonide-formoterol (SYMBICORT) 80-4.5 MCG/ACT inhaler Inhale 2 puffs into the lungs 2 (two) times daily. 1-2 puffs Daily    . diclofenac Sodium (VOLTAREN) 1 % GEL Apply 2 g topically daily as needed (pain).    Marland Kitchen esomeprazole (NEXIUM) 40 MG capsule Take 40 mg by mouth daily at 12 noon. Take 1 Daily    . gabapentin (NEURONTIN) 300 MG capsule Take 300 mg by mouth at bedtime. 1 capsule at bedtime    . metoprolol succinate (TOPROL-XL) 25 MG 24 hr tablet Take 1 tablet (25 mg total) by mouth daily. 90 tablet 3  . Multiple Vitamins-Minerals (CENTRUM ADULTS PO) Take by mouth.     No current facility-administered medications for this visit.    Allergies:   Patient has no known allergies.    Social History:  The patient  reports that he has quit smoking. He has never used smokeless tobacco. He reports current alcohol use of about 2.0 -  4.0 standard drinks of alcohol per week. He reports that he does not use drugs.   Family History:  The patient's family history is not on file.    ROS: All other systems are reviewed and negative. Unless otherwise mentioned in H&P    PHYSICAL EXAM: VS:  BP 122/78   Pulse 67   Ht 6\' 1"  (1.854 m)   Wt 239 lb 3.2 oz (108.5 kg)   BMI 31.56 kg/m  , BMI Body mass index is 31.56 kg/m. GEN: Well nourished, well developed, in no acute distress HEENT: normal Neck: no JVD, carotid bruits, or masses Cardiac: RRR; no murmurs, rubs, or gallops,no edema   Respiratory:  Clear to auscultation bilaterally, normal work of breathing GI: soft, nontender, nondistended, + BS MS: no deformity or atrophy Skin: warm and dry, no rash Neuro:  Strength and sensation are intact Psych: euthymic mood, full affect   EKG: Normal sinus rhythm, with left axis deviation, heart rate of 67 bpm.  Recent Labs: 06/15/2020: TSH 1.29 12/22/2020: ALT 23; B Natriuretic Peptide 64.0; BUN 25; Creatinine, Ser 1.26; Hemoglobin 15.1; Platelets 216; Potassium 4.0; Sodium 140    Lipid Panel    Component Value Date/Time   CHOL 182 06/15/2020 1602   TRIG 125 06/15/2020 1602   HDL 49 06/15/2020 1602   CHOLHDL 3.7 06/15/2020 1602   VLDL 22.6 02/10/2019 1640   LDLCALC 109 (H) 06/15/2020 1602   LDLDIRECT 132.9 09/23/2011 1557      Wt Readings from Last 3 Encounters:  02/01/21 239 lb 3.2 oz (108.5 kg)  01/21/21 238 lb (108 kg)  01/04/21 238 lb (108 kg)      Other studies Reviewed: Echocardiogram 2021/01/16  1. Left ventricular ejection fraction, by estimation, is 50%. The left  ventricle has mildly decreased function. The left ventricle demonstrates  global hypokinesis. Left ventricular diastolic parameters are consistent  with Grade I diastolic dysfunction  (impaired relaxation).  2. Right ventricular systolic function is normal. The right ventricular  size is normal. Tricuspid regurgitation signal is inadequate for assessing  PA pressure.  3. Left atrial size was mildly dilated.  4. Right atrial size was mildly dilated.  5. The mitral valve is normal in structure. No evidence of mitral valve  regurgitation. No evidence of mitral stenosis.  6. The aortic valve is tricuspid. Aortic valve regurgitation is not  visualized. No aortic stenosis is present.  7. Aortic dilatation noted. There is moderate dilatation of the aortic  root, measuring 45 mm.  8. The inferior vena cava is normal in size with greater than 50%  respiratory variability, suggesting  right atrial pressure of 3 mmHg.    ASSESSMENT AND PLAN:    1.  Atrial fibrillation: He remains in normal sinus rhythm 1 month after cardioversion.  Repeat echocardiogram reveals normal LV function.  Per ACC guidelines, I will discontinue Eliquis.  He has been advised to avoid caffeine, to include monster drinks which he was also drinking at times.  He is to limit the amount of alcohol he is drinking.  He states he does not drink very much at all.  He will follow-up in 3 months or sooner should he feel his heart becoming irregular again.  If this is the case he will need to be on Eliquis long-term.  For now he will be switched to enteric-coated aspirin 81 mg daily.  He will continue on metoprolol succinate 25 mg daily  2.  History of GERD: Follow-up with PCP and remain on PPI.  Current medicines are reviewed at length with the patient today.  I have spent 20 mins dedicated to the care of this patient on the date of this encounter to include pre-visit review of records, assessment, management and diagnostic testing,with shared decision making.  Labs/ tests ordered today include: None Bettey Mare. Liborio Nixon, ANP, AACC   02/01/2021 4:47 PM    [facility] [address] Office [phone] Fax [phone]  Notice: This dictation was prepared with Dragon dictation along with smaller phrase technology. Any transcriptional errors that result from this process are unintentional and may not be corrected upon review.

## 2021-02-01 NOTE — Patient Instructions (Signed)
Medication Instructions:  STOP- Eliquis START- Aspirin 81 mg by mouth daily  *If you need a refill on your cardiac medications before your next appointment, please call your pharmacy*   Lab Work: None Ordered   Testing/Procedures: None Ordered   Follow-Up: At [facility], you and your health needs are our priority.  As part of our continuing mission to provide you with exceptional heart care, we have created designated [name] Care Teams.  These Care Teams include your primary Cardiologist (physician) and Advanced Practice Providers (APPs -  Physician Assistants and Nurse Practitioners) who all work together to provide you with the care you need, when you need it.  We recommend signing up for the patient portal called "MyChart".  Sign up information is provided on this After Visit Summary.  MyChart is used to connect with patients for Virtual Visits (Telemedicine).  Patients are able to view lab/test results, encounter notes, upcoming appointments, etc.  Non-urgent messages can be sent to your [name] as well.   To learn more about what you can do with MyChart, go to [email or web address].    Your next appointment:   3 month(s)  The format for your next appointment:   In Person  [name]:   You may see Dr Duke Salvia or one of the following Advanced Practice Providers on your designated Care Team:    [place], PA-C  Edd Fabian, FNP

## 2021-03-20 DIAGNOSIS — M65331 Trigger finger, right middle finger: Secondary | ICD-10-CM | POA: Diagnosis not present

## 2021-03-20 DIAGNOSIS — M25331 Other instability, right wrist: Secondary | ICD-10-CM | POA: Diagnosis not present

## 2021-03-20 DIAGNOSIS — M180 Bilateral primary osteoarthritis of first carpometacarpal joints: Secondary | ICD-10-CM | POA: Diagnosis not present

## 2021-04-02 ENCOUNTER — Other Ambulatory Visit: Payer: Self-pay | Admitting: Radiology

## 2021-04-02 MED ORDER — GABAPENTIN 300 MG PO CAPS: 300.0000 mg | ORAL_CAPSULE | Freq: Every day | ORAL | 3 refills | Status: DC

## 2021-05-03 ENCOUNTER — Ambulatory Visit: Payer: BC Managed Care – PPO | Admitting: Cardiovascular Disease

## 2021-06-14 ENCOUNTER — Other Ambulatory Visit: Payer: Self-pay

## 2021-06-14 ENCOUNTER — Ambulatory Visit (HOSPITAL_BASED_OUTPATIENT_CLINIC_OR_DEPARTMENT_OTHER): Payer: BC Managed Care – PPO | Admitting: Cardiovascular Disease

## 2021-06-14 ENCOUNTER — Encounter (HOSPITAL_BASED_OUTPATIENT_CLINIC_OR_DEPARTMENT_OTHER): Payer: Self-pay | Admitting: Cardiovascular Disease

## 2021-06-14 VITALS — BP 118/84 | HR 65 | Ht 73.0 in | Wt 238.8 lb

## 2021-06-14 DIAGNOSIS — R0600 Dyspnea, unspecified: Secondary | ICD-10-CM | POA: Diagnosis not present

## 2021-06-14 DIAGNOSIS — Z01812 Encounter for preprocedural laboratory examination: Secondary | ICD-10-CM | POA: Diagnosis not present

## 2021-06-14 DIAGNOSIS — I4892 Unspecified atrial flutter: Secondary | ICD-10-CM | POA: Diagnosis not present

## 2021-06-14 DIAGNOSIS — I5042 Chronic combined systolic (congestive) and diastolic (congestive) heart failure: Secondary | ICD-10-CM

## 2021-06-14 DIAGNOSIS — R0609 Other forms of dyspnea: Secondary | ICD-10-CM

## 2021-06-14 HISTORY — DX: Chronic combined systolic (congestive) and diastolic (congestive) heart failure: I50.42

## 2021-06-14 MED ORDER — METOPROLOL TARTRATE 100 MG PO TABS: ORAL_TABLET | ORAL | 0 refills | Status: DC

## 2021-06-14 NOTE — Assessment & Plan Note (Signed)
LVEF was less than 20% at the time when he was noted to be in atrial fibrillation.  His systolic function subsequently improved to 50% 01/2021.  He is continues to be euvolemic on exam today.  Continue metoprolol.  We will repeat an echo in a year to reassess his systolic function.

## 2021-06-14 NOTE — Assessment & Plan Note (Addendum)
It seems that he has remained in sinus rhythm.  Eliquis was discontinued and he started taking aspirin 81 mg daily.  His CHA2DS2-VASc score is 1 for age.  He does have a noted history of hypertension, though his blood pressures in the system seem quite low and he is only on very low-dose metoprolol.  He previously had systolic dysfunction, but this improved with restoration of sinus rhythm and rate control.  It is reasonable to keep him off anticoagulation unless he has recurrent episodes or develops other comorbidities.

## 2021-06-14 NOTE — Assessment & Plan Note (Signed)
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  He notes exertional dyspnea.  In the past he was noted to be in atrial fibrillation.  Since he has been maintaining sinus rhythm it seems to be somewhat better.  However he does still gets more short of breath than he would expect.  He was noted to have pulmonary nodule when he had a chest CT in the hospital.  He is due for follow-up on that.  Therefore we will get a coronary CT-A to evaluate both his pulmonary nodule and his coronary arteries.  Echo 01/2021 revealed improvement in his systolic function and he is euvolemic on exam.

## 2021-06-14 NOTE — Progress Notes (Signed)
Cardiology Office Note:    Date:  06/14/2021   ID:  [name], DOB Nov 02, 1951, MRN [id number]  PCP:  Corwin Levins, MD   [facility] Providers Cardiologist:  Chilton Si, MD     Referring MD: Corwin Levins, MD   No chief complaint on file.   History of Present Illness:    [name] is a 70 y.o. male with a hx of paroxysmal atrial fibrillation, chronic systolic diastolic heart failure (recovered LVEF), hyperlipidemia, hypertension, obesity, and GERD, here for follow-up. He was first seen in the Atrial fibrillation clinic 12/2020 for atrial flutter. He was started on eliquis and metoprolol. He had a TEE/DCCV 12/2020. The echo revealed LVEF less then 20% with global hypokinesis and a secundum ASD. He followed up with Metta Clines, DNP and was referred for repeat Echo, where his LVEF had improved to 50%.  Today, he is feeling okay overall. He denies having any more episodes of Afib since his ED visit. When he was in Afib previously, he felt significant shortness of breath with minimal exertion. His regimen includes a baby aspirin in the morning and one naproxen once in the evening. He was prescribed gabapentin twice a day but only takes it once a day. For activity, he has been a Theatre stage manager for 43 years. 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 Typically he works 10-12 hours a day and is very fatigued after work, but he denies any other exertional symptoms. Walking does not cause pain in his legs. When he was younger (high school) he sometimes smoked recreational drugs. He denies any chest pain, shortness of breath, or palpitations. No headaches, lightheadedness, or syncope to report. Also has no lower extremity edema, orthopnea or PND.    Past Medical History:  Diagnosis Date   ALLERGIC RHINITIS 12/31/2007   Chronic combined systolic and diastolic heart failure (HCC) 06/14/2021   COUGH, CHRONIC 07/18/2007   DEGENERATIVE  JOINT DISEASE 07/18/2007   Extrinsic asthma, unspecified 09/08/2009   GERD 07/18/2007   HYPERLIPIDEMIA 08/14/2010   Hypertension    Impaired glucose tolerance 09/23/2011   OBESITY 07/18/2007    Past Surgical History:  Procedure Laterality Date   BACK SURGERY     CARDIOVERSION N/A 12/28/2020   Procedure: CARDIOVERSION;  Surgeon: Chilton Si, MD;  Location: [facility] ENDOSCOPY;  Service: Cardiovascular;  Laterality: N/A;   DG FEMUR LEFT  ([facility] HX)     rod placed   TEE WITHOUT CARDIOVERSION N/A 12/28/2020   Procedure: TRANSESOPHAGEAL ECHOCARDIOGRAM (TEE);  Surgeon: Chilton Si, MD;  Location: [facility] ENDOSCOPY;  Service: Cardiovascular;  Laterality: N/A;   TOOTH EXTRACTION     TOTAL KNEE ARTHROPLASTY Right 07/26/2018   Procedure: RIGHT TOTAL KNEE ARTHROPLASTY;  Surgeon: Kerrin Champagne, MD;  Location: [facility] OR;  Service: Orthopedics;  Laterality: Right;    Current Medications: Current Meds  Medication Sig   albuterol (PROVENTIL HFA;VENTOLIN HFA) 108 (90 Base) MCG/ACT inhaler Inhale 2 puffs into the lungs every 6 (six) hours as needed for wheezing or shortness of breath.   aspirin EC 81 MG tablet Take 81 mg by mouth daily. Swallow whole.   baclofen (LIORESAL) 10 MG tablet Take 10 mg by mouth daily as needed for muscle spasms.   budesonide-formoterol (SYMBICORT) 80-4.5 MCG/ACT inhaler Inhale 2 puffs into the lungs 2 (two) times daily. 1-2 puffs Daily   diclofenac Sodium (VOLTAREN) 1 % GEL Apply 2 g topically daily as needed (pain).   esomeprazole (NEXIUM) 40 MG capsule  Take 40 mg by mouth daily at 12 noon. Take 1 Daily   gabapentin (NEURONTIN) 300 MG capsule Take 1 capsule (300 mg total) by mouth at bedtime. 1 capsule at bedtime   metoprolol succinate (TOPROL-XL) 25 MG 24 hr tablet Take 1 tablet (25 mg total) by mouth daily.   metoprolol tartrate (LOPRESSOR) 100 MG tablet TAKE 1 TABLET 2 HOURS PRIOR TO CT   Multiple Vitamins-Minerals (CENTRUM ADULTS PO) Take by mouth.     Allergies:   Patient  has no known allergies.   Social History   Socioeconomic History   Marital status: Married    Spouse name: Not on file   Number of children: Not on file   Years of education: Not on file   Highest education level: Not on file  Occupational History   Occupation: truck driver  Tobacco Use   Smoking status: Former   Smokeless tobacco: Never   Tobacco comments:    Marijuana 30 years  Vaping Use   Vaping Use: Never used  Substance and Sexual Activity   Alcohol use: Yes    Alcohol/week: 2.0 - 4.0 standard drinks    Types: 2 - 4 Cans of beer per week   Drug use: Never   Sexual activity: Not on file  Other Topics Concern   Not on file  Social History Narrative   Not on file   Social Determinants of Health   Financial Resource Strain: Not on file  Food Insecurity: Not on file  Transportation Needs: Not on file  Physical Activity: Not on file  Stress: Not on file  Social Connections: Not on file     Family History: The patient's family history is not on file.  ROS:   Please see the history of present illness.    (+) Fatigue All other systems reviewed and are negative.  EKGs/Labs/Other Studies Reviewed:    The following studies were reviewed today:  Echo 01/07/2021:  1. Left ventricular ejection fraction, by estimation, is 50%. The left  ventricle has mildly decreased function. The left ventricle demonstrates  global hypokinesis. Left ventricular diastolic parameters are consistent  with Grade I diastolic dysfunction  (impaired relaxation).   2. Right ventricular systolic function is normal. The right ventricular  size is normal. Tricuspid regurgitation signal is inadequate for assessing  PA pressure.   3. Left atrial size was mildly dilated.   4. Right atrial size was mildly dilated.   5. The mitral valve is normal in structure. No evidence of mitral valve  regurgitation. No evidence of mitral stenosis.   6. The aortic valve is tricuspid. Aortic valve  regurgitation is not  visualized. No aortic stenosis is present.   7. Aortic dilatation noted. There is moderate dilatation of the aortic  root, measuring 45 mm.   8. The inferior vena cava is normal in size with greater than 50%  respiratory variability, suggesting right atrial pressure of 3 mmHg.   EKG:   06/14/2021: EKG is not ordered today.  Recent Labs: 06/15/2020: TSH 1.29 12/22/2020: ALT 23; B Natriuretic Peptide 64.0; BUN 25; Creatinine, Ser 1.26; Hemoglobin 15.1; Platelets 216; Potassium 4.0; Sodium 140  Recent Lipid Panel    Component Value Date/Time   CHOL 182 06/15/2020 1602   TRIG 125 06/15/2020 1602   HDL 49 06/15/2020 1602   CHOLHDL 3.7 06/15/2020 1602   VLDL 22.6 02/10/2019 1640   LDLCALC 109 (H) 06/15/2020 1602   LDLDIRECT 132.9 09/23/2011 1557     Risk Assessment/Calculations:  CHA2DS2-VASc Score = 1  This indicates a 0.6% annual risk of stroke. The patient's score is based upon: CHF History: No HTN History: No Diabetes History: No Stroke History: No Vascular Disease History: No Age Score: 1 Gender Score: 0           Physical Exam:    VS:  BP 118/84   Pulse 65   Ht  (1.854 m)   Wt 238 lb 12.8 oz (108.3 kg)   SpO2 98%   BMI 31.51 kg/m  , BMI Body mass index is 31.51 kg/m. GENERAL:  Well appearing HEENT: Pupils equal round and reactive, fundi not visualized, oral mucosa unremarkable NECK:  No jugular venous distention, waveform within normal limits, carotid upstroke brisk and symmetric, no bruits LUNGS:  Clear to auscultation bilaterally HEART:  RRR.  PMI not displaced or sustained,S1 and S2 within normal limits, no S3, no S4, no clicks, no rubs, no murmurs ABD:  Flat, positive bowel sounds normal in frequency in pitch, no bruits, no rebound, no guarding, no midline pulsatile mass, no hepatomegaly, no splenomegaly EXT:  2 plus pulses throughout, no edema, no cyanosis no clubbing SKIN:  No rashes no nodules NEURO:  Cranial nerves II  through XII grossly intact, motor grossly intact throughout PSYCH:  Cognitively intact, oriented to person place and time   ASSESSMENT:    1. DOE (dyspnea on exertion)   2. Pre-procedure lab exam   3. Chronic combined systolic and diastolic heart failure (HCC)   4. Atrial flutter, unspecified type (HCC)    PLAN:   Atrial flutter (HCC) It seems that he has remained in sinus rhythm.  Eliquis was discontinued and he started taking aspirin 81 mg daily.  His CHA2DS2-VASc score is 1 for age.  He does have a noted history of hypertension, though his blood pressures in the system seem quite low and he is only on very low-dose metoprolol.  He previously had systolic dysfunction, but this improved with restoration of sinus rhythm and rate control.  It is reasonable to keep him off anticoagulation unless he has recurrent episodes or develops other comorbidities.  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  He notes exertional dyspnea.  In the past he was noted to be in atrial fibrillation.  Since he has been maintaining sinus rhythm it seems to be somewhat better.  However he does still gets more short of breath than he would expect.  He was noted to have pulmonary nodule when he had a chest CT in the hospital.  He is due for follow-up on that.  Therefore we will get a coronary CT-A to evaluate both his pulmonary nodule and his coronary arteries.  Echo 01/2021 revealed improvement in his systolic function and he is euvolemic on exam.  Chronic combined systolic and diastolic heart failure (HCC) LVEF was less than 20% at the time when he was noted to be in atrial fibrillation.  His systolic function subsequently improved to 50% 01/2021.  He is continues to be euvolemic on exam today.  Continue metoprolol.  We will repeat an echo in a year to reassess his systolic function.   Disposition: FU with [name] C. Duke Salvia, MD, [facility] in 6 months.   Medication Adjustments/Labs and Tests  Ordered: Current medicines are reviewed at length with the patient today.  Concerns regarding medicines are outlined above.  Orders Placed This Encounter  Procedures   CT CORONARY MORPH W/CTA COR W/SCORE W/CA W/CM &/OR WO/CM   Basic  metabolic panel   ECHOCARDIOGRAM COMPLETE   Meds ordered this encounter  Medications   metoprolol tartrate (LOPRESSOR) 100 MG tablet    Sig: TAKE 1 TABLET 2 HOURS PRIOR TO CT    Dispense:  1 tablet    Refill:  0    Patient Instructions  Medication Instructions:  TAKE METOPROLOL 100 MG 2 HOURS PRIOR TO CT   *If you need a refill on your cardiac medications before your next appointment, please call your pharmacy*  Lab Work: BMET 1 WEEK PRIOR TO CARDIAC CT  If you have labs (blood work) drawn today and your tests are completely normal, you will receive your results only by: MyChart Message (if you have MyChart) OR A paper copy in the mail If you have any lab test that is abnormal or we need to change your treatment, we will call you to review the results.   Testing/Procedures: Your physician has requested that you have cardiac CT. Cardiac computed tomography (CT) is a painless test that uses an x-ray machine to take clear, detailed pictures of your heart. For further information please visit [email or web address] Please follow instruction sheet as given. ONCE INSURANCE HAS BEEN REVIEWED THE OFFICE WILL CALL YOU TO SCHEDULE IF YOU DO NOT HEAR IN 2 WEEKS CALL THE OFFICE TO FOLLOW UP   Your physician has requested that you have an echocardiogram. Echocardiography is a painless test that uses sound waves to create images of your heart. It provides your doctor with information about the size and shape of your heart and how well your heart's chambers and valves are working. This procedure takes approximately one hour. There are no restrictions for this procedure. [facility] AT [address]   Follow-Up: At [facility], you and your health  needs are our priority.  As part of our continuing mission to provide you with exceptional heart care, we have created designated Provider Care Teams.  These Care Teams include your primary Cardiologist (physician) and Advanced Practice Providers (APPs -  Physician Assistants and Nurse Practitioners) who all work together to provide you with the care you need, when you need it.  We recommend signing up for the patient portal called "MyChart".  Sign up information is provided on this After Visit Summary.  MyChart is used to connect with patients for Virtual Visits (Telemedicine).  Patients are able to view lab/test results, encounter notes, upcoming appointments, etc.  Non-urgent messages can be sent to your provider as well.   To learn more about what you can do with MyChart, go to [email or web address].    Your next appointment:   AFTER ECHO   The format for your next appointment:   In Person  Provider:   Chilton Si, MD   Other Instructions    Your cardiac CT will be scheduled at one of the below locations:   [facility] [address] [place], [place] [address] [phone]  OR  [facility] [address] [place], [place] [address] [phone]  If scheduled at [facility], please arrive at the [facility] main entrance (entrance A) of [facility] 30 minutes prior to test start time. Proceed to the [facility] Radiology Department (first floor) to check-in and test prep.  If scheduled at [facility], please arrive 15 mins early for check-in and test prep.  Please follow these instructions carefully (unless otherwise directed):  Hold all erectile dysfunction medications at least 3  days (72 hrs) prior to test.  On the Night Before the Test: Be sure to Drink plenty of water. Do not consume any caffeinated/decaffeinated beverages or chocolate 12 hours prior to your  test. Do not take any antihistamines 12 hours prior to your test. If the patient has contrast allergy: Patient will need a prescription for Prednisone and very clear instructions (as follows): Prednisone 50 mg - take 13 hours prior to test Take another Prednisone 50 mg 7 hours prior to test Take another Prednisone 50 mg 1 hour prior to test Take Benadryl 50 mg 1 hour prior to test Patient must complete all four doses of above prophylactic medications. Patient will need a ride after test due to Benadryl.  On the Day of the Test: Drink plenty of water until 1 hour prior to the test. Do not eat any food 4 hours prior to the test. You may take your regular medications prior to the test.  Take metoprolol (Lopressor) two hours prior to test. HOLD Furosemide/Hydrochlorothiazide morning of the test. FEMALES- please wear underwire-free bra if available, avoid dresses & tight clothing      After the Test: Drink plenty of water. After receiving IV contrast, you may experience a mild flushed feeling. This is normal. On occasion, you may experience a mild rash up to 24 hours after the test. This is not dangerous. If this occurs, you can take Benadryl 25 mg and increase your fluid intake. If you experience trouble breathing, this can be serious. If it is severe call 911 IMMEDIATELY. If it is mild, please call our office. If you take any of these medications: Glipizide/Metformin, Avandament, Glucavance, please do not take 48 hours after completing test unless otherwise instructed.  Please allow 2-4 weeks for scheduling of routine cardiac CTs. Some insurance companies require a pre-authorization which may delay scheduling of this test.   For non-scheduling related questions, please contact the cardiac imaging nurse navigator should you have any questions/concerns: Rockwell AlexandriaSara Wallace, Cardiac Imaging Nurse Navigator Larey BrickMerle Prescott, Cardiac Imaging Nurse Navigator [facility] Direct Office Dial: [phone]   For scheduling needs, including cancellations and rescheduling, please call [place], [phone].   Cardiac CT Angiogram A cardiac CT angiogram is a procedure to look at the heart and the area around the heart. It may be done to help find the cause of chest pains or other symptoms of heart disease. During this procedure, a substance called contrast dye is injected into the blood vessels in the area to be checked. A large X-ray machine, called a CT scanner, then takes detailed pictures of the heart and the surrounding area. The procedure is also sometimes called a coronary CTangiogram, coronary artery scanning, or CTA. A cardiac CT angiogram allows the health care provider to see how well blood is flowing to and from the heart. The health care provider will be able to see if there are any problems, such as: Blockage or narrowing of the coronary arteries in the heart. Fluid around the heart. Signs of weakness or disease in the muscles, valves, and tissues of the heart. Tell a health care provider about: Any allergies you have. This is especially important if you have had a previous allergic reaction to contrast dye. All medicines you are taking, including vitamins, herbs, eye drops, creams, and over-the-counter medicines. Any blood disorders you have. Any surgeries you have had. Any medical conditions you have. Whether you are pregnant or may be pregnant. Any anxiety disorders, chronic pain, or  other conditions you have that may increase your stress or prevent you from lying still. What are the risks? Generally, this is a safe procedure. However, problems may occur, including: Bleeding. Infection. Allergic reactions to medicines or dyes. Damage to other structures or organs. Kidney damage from the contrast dye that is used. Increased risk of cancer from radiation exposure. This risk is low. Talk with your health care provider about: The risks  and benefits of testing. How you can receive the lowest dose of radiation. What happens before the procedure? Wear comfortable clothing and remove any jewelry, glasses, dentures, and hearing aids. Follow instructions from your health care provider about eating and drinking. This may include: For 12 hours before the procedure -- avoid caffeine. This includes tea, coffee, soda, energy drinks, and diet pills. Drink plenty of water or other fluids that do not have caffeine in them. Being well hydrated can prevent complications. For 4-6 hours before the procedure -- stop eating and drinking. The contrast dye can cause nausea, but this is less likely if your stomach is empty. Ask your health care provider about changing or stopping your regular medicines. This is especially important if you are taking diabetes medicines, blood thinners, or medicines to treat problems with erections (erectile dysfunction). What happens during the procedure?  Hair on your chest may need to be removed so that small sticky patches called electrodes can be placed on your chest. These will transmit information that helps to monitor your heart during the procedure. An IV will be inserted into one of your veins. You might be given a medicine to control your heart rate during the procedure. This will help to ensure that good images are obtained. You will be asked to lie on an exam table. This table will slide in and out of the CT machine during the procedure. Contrast dye will be injected into the IV. You might feel warm, or you may get a metallic taste in your mouth. You will be given a medicine called nitroglycerin. This will relax or dilate the arteries in your heart. The table that you are lying on will move into the CT machine tunnel for the scan. The person running the machine will give you instructions while the scans are being done. You may be asked to: Keep your arms above your head. Hold your breath. Stay very still,  even if the table is moving. When the scanning is complete, you will be moved out of the machine. The IV will be removed. The procedure may vary among health care providers and hospitals. What can I expect after the procedure? After your procedure, it is common to have: A metallic taste in your mouth from the contrast dye. A feeling of warmth. A headache from the nitroglycerin. Follow these instructions at home: Take over-the-counter and prescription medicines only as told by your health care provider. If you are told, drink enough fluid to keep your urine pale yellow. This will help to flush the contrast dye out of your body. Most people can return to their normal activities right after the procedure. Ask your health care provider what activities are safe for you. It is up to you to get the results of your procedure. Ask your health care provider, or the department that is doing the procedure, when your results will be ready. Keep all follow-up visits as told by your health care provider. This is important. Contact a health care provider if: You have any symptoms of allergy to the contrast  dye. These include: Shortness of breath. Rash or hives. A racing heartbeat. Summary A cardiac CT angiogram is a procedure to look at the heart and the area around the heart. It may be done to help find the cause of chest pains or other symptoms of heart disease. During this procedure, a large X-ray machine, called a CT scanner, takes detailed pictures of the heart and the surrounding area after a contrast dye has been injected into blood vessels in the area. Ask your health care provider about changing or stopping your regular medicines before the procedure. This is especially important if you are taking diabetes medicines, blood thinners, or medicines to treat erectile dysfunction. If you are told, drink enough fluid to keep your urine pale yellow. This will help to flush the contrast dye out of your  body. This information is not intended to replace advice given to you by your health care provider. Make sure you discuss any questions you have with your healthcare provider. Document Revised: 07/13/2019 Document Reviewed: 07/13/2019 Elsevier Patient Education  2022 Elsevier Inc.   EchoStar Stumpf,acting as a Neurosurgeon for Chilton Si, MD.,have documented all relevant documentation on the behalf of Chilton Si, MD,as directed by  Chilton Si, MD while in the presence of Chilton Si, MD.  I, Maciah Feeback C. Duke Salvia, MD have reviewed all documentation for this visit.  The documentation of the exam, diagnosis, procedures, and orders on 06/14/2021 are all accurate and complete.   Signed, Chilton Si, MD  06/14/2021 1:50 PM    [facility]

## 2021-06-14 NOTE — Patient Instructions (Addendum)
Medication Instructions:  TAKE METOPROLOL 100 MG 2 HOURS PRIOR TO CT   *If you need a refill on your cardiac medications before your next appointment, please call your pharmacy*  Lab Work: BMET 1 WEEK PRIOR TO CARDIAC CT  If you have labs (blood work) drawn today and your tests are completely normal, you will receive your results only by: MyChart Message (if you have MyChart) OR A paper copy in the mail If you have any lab test that is abnormal or we need to change your treatment, we will call you to review the results.   Testing/Procedures: Your physician has requested that you have cardiac CT. Cardiac computed tomography (CT) is a painless test that uses an x-ray machine to take clear, detailed pictures of your heart. For further information please visit [email or web address]. Please follow instruction sheet as given. ONCE INSURANCE HAS BEEN REVIEWED THE OFFICE WILL CALL YOU TO SCHEDULE IF YOU DO NOT HEAR IN 2 WEEKS CALL THE OFFICE TO FOLLOW UP   Your physician has requested that you have an echocardiogram. Echocardiography is a painless test that uses sound waves to create images of your heart. It provides your doctor with information about the size and shape of your heart and how well your heart's chambers and valves are working. This procedure takes approximately one hour. There are no restrictions for this procedure. [facility] AT [address]   Follow-Up: At [facility], you and your health needs are our priority.  As part of our continuing mission to provide you with exceptional heart care, we have created designated Provider Care Teams.  These Care Teams include your primary Cardiologist (physician) and Advanced Practice Providers (APPs -  Physician Assistants and Nurse Practitioners) who all work together to provide you with the care you need, when you need it.  We recommend signing up for the patient portal called "MyChart".  Sign up information is provided on this  After Visit Summary.  MyChart is used to connect with patients for Virtual Visits (Telemedicine).  Patients are able to view lab/test results, encounter notes, upcoming appointments, etc.  Non-urgent messages can be sent to your provider as well.   To learn more about what you can do with MyChart, go to [email or web address].    Your next appointment:   AFTER ECHO   The format for your next appointment:   In Person  Provider:   Chilton Siiffany [place], MD   Other Instructions    Your cardiac CT will be scheduled at one of the below locations:   [facility] [address] [place], [place] [address] [phone]  OR  [facility] [address] [place], [place] [address] [phone]  If scheduled at [facility], please arrive at the [facility] main entrance (entrance A) of [facility] 30 minutes prior to test start time. Proceed to the [facility] Radiology Department (first floor) to check-in and test prep.  If scheduled at [facility], please arrive 15 mins early for check-in and test prep.  Please follow these instructions carefully (unless otherwise directed):  Hold all erectile dysfunction medications at least 3 days (72 hrs) prior to test.  On the Night Before the Test: Be sure to Drink plenty of water. Do not consume any caffeinated/decaffeinated beverages or chocolate 12 hours prior to your test. Do not take any antihistamines 12 hours prior to your test. If the patient has contrast allergy: Patient will  need a prescription for Prednisone and very clear instructions (as follows): Prednisone 50 mg - take 13 hours prior to test Take another Prednisone 50 mg 7 hours prior to test Take another Prednisone 50 mg 1 hour prior to test Take Benadryl 50 mg 1 hour prior to test Patient must complete all four doses of above prophylactic medications. Patient will need a ride  after test due to Benadryl.  On the Day of the Test: Drink plenty of water until 1 hour prior to the test. Do not eat any food 4 hours prior to the test. You may take your regular medications prior to the test.  Take metoprolol (Lopressor) two hours prior to test. HOLD Furosemide/Hydrochlorothiazide morning of the test. FEMALES- please wear underwire-free bra if available, avoid dresses & tight clothing      After the Test: Drink plenty of water. After receiving IV contrast, you may experience a mild flushed feeling. This is normal. On occasion, you may experience a mild rash up to 24 hours after the test. This is not dangerous. If this occurs, you can take Benadryl 25 mg and increase your fluid intake. If you experience trouble breathing, this can be serious. If it is severe call 911 IMMEDIATELY. If it is mild, please call our office. If you take any of these medications: Glipizide/Metformin, Avandament, Glucavance, please do not take 48 hours after completing test unless otherwise instructed.  Please allow 2-4 weeks for scheduling of routine cardiac CTs. Some insurance companies require a pre-authorization which may delay scheduling of this test.   For non-scheduling related questions, please contact the cardiac imaging nurse navigator should you have any questions/concerns: Rockwell Alexandria, Cardiac Imaging Nurse Navigator Larey Brick, Cardiac Imaging Nurse Navigator [facility] Direct Office Dial: [phone]   For scheduling needs, including cancellations and rescheduling, please call [place], [phone].   Cardiac CT Angiogram A cardiac CT angiogram is a procedure to look at the heart and the area around the heart. It may be done to help find the cause of chest pains or other symptoms of heart disease. During this procedure, a substance called contrast dye is injected into the blood vessels in the area to be checked. A large X-ray machine, called a CT  scanner, then takes detailed pictures of the heart and the surrounding area. The procedure is also sometimes called a coronary CTangiogram, coronary artery scanning, or CTA. A cardiac CT angiogram allows the health care provider to see how well blood is flowing to and from the heart. The health care provider will be able to see if there are any problems, such as: Blockage or narrowing of the coronary arteries in the heart. Fluid around the heart. Signs of weakness or disease in the muscles, valves, and tissues of the heart. Tell a health care provider about: Any allergies you have. This is especially important if you have had a previous allergic reaction to contrast dye. All medicines you are taking, including vitamins, herbs, eye drops, creams, and over-the-counter medicines. Any blood disorders you have. Any surgeries you have had. Any medical conditions you have. Whether you are pregnant or may be pregnant. Any anxiety disorders, chronic pain, or other conditions you have that may increase your stress or prevent you from lying still. What are the risks? Generally, this is a safe procedure. However, problems may occur, including: Bleeding. Infection. Allergic reactions to medicines or dyes. Damage to other structures or organs. Kidney damage from the contrast dye that is used.  Increased risk of cancer from radiation exposure. This risk is low. Talk with your health care provider about: The risks and benefits of testing. How you can receive the lowest dose of radiation. What happens before the procedure? Wear comfortable clothing and remove any jewelry, glasses, dentures, and hearing aids. Follow instructions from your health care provider about eating and drinking. This may include: For 12 hours before the procedure -- avoid caffeine. This includes tea, coffee, soda, energy drinks, and diet pills. Drink plenty of water or other fluids that do not have caffeine in them. Being well hydrated  can prevent complications. For 4-6 hours before the procedure -- stop eating and drinking. The contrast dye can cause nausea, but this is less likely if your stomach is empty. Ask your health care provider about changing or stopping your regular medicines. This is especially important if you are taking diabetes medicines, blood thinners, or medicines to treat problems with erections (erectile dysfunction). What happens during the procedure?  Hair on your chest may need to be removed so that small sticky patches called electrodes can be placed on your chest. These will transmit information that helps to monitor your heart during the procedure. An IV will be inserted into one of your veins. You might be given a medicine to control your heart rate during the procedure. This will help to ensure that good images are obtained. You will be asked to lie on an exam table. This table will slide in and out of the CT machine during the procedure. Contrast dye will be injected into the IV. You might feel warm, or you may get a metallic taste in your mouth. You will be given a medicine called nitroglycerin. This will relax or dilate the arteries in your heart. The table that you are lying on will move into the CT machine tunnel for the scan. The person running the machine will give you instructions while the scans are being done. You may be asked to: Keep your arms above your head. Hold your breath. Stay very still, even if the table is moving. When the scanning is complete, you will be moved out of the machine. The IV will be removed. The procedure may vary among health care providers and hospitals. What can I expect after the procedure? After your procedure, it is common to have: A metallic taste in your mouth from the contrast dye. A feeling of warmth. A headache from the nitroglycerin. Follow these instructions at home: Take over-the-counter and prescription medicines only as told by your health care  provider. If you are told, drink enough fluid to keep your urine pale yellow. This will help to flush the contrast dye out of your body. Most people can return to their normal activities right after the procedure. Ask your health care provider what activities are safe for you. It is up to you to get the results of your procedure. Ask your health care provider, or the department that is doing the procedure, when your results will be ready. Keep all follow-up visits as told by your health care provider. This is important. Contact a health care provider if: You have any symptoms of allergy to the contrast dye. These include: Shortness of breath. Rash or hives. A racing heartbeat. Summary A cardiac CT angiogram is a procedure to look at the heart and the area around the heart. It may be done to help find the cause of chest pains or other symptoms of heart disease. During this procedure, a  large X-ray machine, called a CT scanner, takes detailed pictures of the heart and the surrounding area after a contrast dye has been injected into blood vessels in the area. Ask your health care provider about changing or stopping your regular medicines before the procedure. This is especially important if you are taking diabetes medicines, blood thinners, or medicines to treat erectile dysfunction. If you are told, drink enough fluid to keep your urine pale yellow. This will help to flush the contrast dye out of your body. This information is not intended to replace advice given to you by your health care provider. Make sure you discuss any questions you have with your healthcare provider. Document Revised: 07/13/2019 Document Reviewed: 07/13/2019 Elsevier Patient Education  2022 [employer].

## 2021-06-25 DIAGNOSIS — R0600 Dyspnea, unspecified: Secondary | ICD-10-CM | POA: Diagnosis not present

## 2021-06-25 DIAGNOSIS — Z01812 Encounter for preprocedural laboratory examination: Secondary | ICD-10-CM | POA: Diagnosis not present

## 2021-06-25 LAB — BASIC METABOLIC PANEL
BUN/Creatinine Ratio: 17 (ref 10–24)
BUN: 21 mg/dL (ref 8–27)
CO2: 22 mmol/L (ref 20–29)
Calcium: 9.6 mg/dL (ref 8.6–10.2)
Chloride: 103 mmol/L (ref 96–106)
Creatinine, Ser: 1.25 mg/dL (ref 0.76–1.27)
Glucose: 90 mg/dL (ref 65–99)
Potassium: 4.8 mmol/L (ref 3.5–5.2)
Sodium: 138 mmol/L (ref 134–144)
eGFR: 63 mL/min/{1.73_m2} (ref >59)

## 2021-06-26 ENCOUNTER — Telehealth (HOSPITAL_COMMUNITY): Payer: Self-pay | Admitting: Emergency Medicine

## 2021-06-26 NOTE — Telephone Encounter (Signed)
Reaching out to patient to offer assistance regarding upcoming cardiac imaging study; pt verbalizes understanding of appt date/time, parking situation and where to check in, pre-test NPO status and medications ordered, and verified current allergies; name and call back number provided for further questions should they arise Rockwell Alexandria RN Navigator Cardiac Imaging [facility] [phone] office [phone] cell  100mg  metoprolol tart  Denies IV issues Denies claustro

## 2021-06-28 ENCOUNTER — Ambulatory Visit (HOSPITAL_COMMUNITY)
Admission: RE | Admit: 2021-06-28 | Discharge: 2021-06-28 | Disposition: A | Discharge status: Home / Self Care | Payer: BC Managed Care – PPO | Source: Ambulatory Visit | Attending: Cardiovascular Disease | Admitting: Cardiovascular Disease

## 2021-06-28 ENCOUNTER — Other Ambulatory Visit: Payer: Self-pay

## 2021-06-28 DIAGNOSIS — I700 Atherosclerosis of aorta: Secondary | ICD-10-CM | POA: Diagnosis not present

## 2021-06-28 DIAGNOSIS — R0600 Dyspnea, unspecified: Secondary | ICD-10-CM | POA: Insufficient documentation

## 2021-06-28 DIAGNOSIS — R0609 Other forms of dyspnea: Secondary | ICD-10-CM

## 2021-06-28 MED ORDER — NITROGLYCERIN 0.4 MG SL SUBL
0.8000 mg | SUBLINGUAL_TABLET | Freq: Once | SUBLINGUAL | Status: AC
  Administered 2021-06-28: 0.8 mg via SUBLINGUAL

## 2021-06-28 MED ORDER — IOHEXOL 350 MG/ML SOLN
100.0000 mL | Freq: Once | INTRAVENOUS | Status: AC | PRN
  Administered 2021-06-28: 100 mL via INTRAVENOUS

## 2021-06-28 MED ORDER — NITROGLYCERIN 0.4 MG SL SUBL
SUBLINGUAL_TABLET | SUBLINGUAL | Status: AC
  Filled 2021-06-28: qty 1

## 2021-07-02 ENCOUNTER — Telehealth (INDEPENDENT_AMBULATORY_CARE_PROVIDER_SITE_OTHER): Payer: BC Managed Care – PPO | Admitting: Family Medicine

## 2021-07-02 ENCOUNTER — Encounter: Payer: Self-pay | Admitting: Family Medicine

## 2021-07-02 DIAGNOSIS — R0981 Nasal congestion: Secondary | ICD-10-CM | POA: Diagnosis not present

## 2021-07-02 DIAGNOSIS — R059 Cough, unspecified: Secondary | ICD-10-CM | POA: Diagnosis not present

## 2021-07-02 MED ORDER — BENZONATATE 100 MG PO CAPS: 200.0000 mg | ORAL_CAPSULE | Freq: Three times a day (TID) | ORAL | 0 refills | Status: DC | PRN

## 2021-07-02 NOTE — Patient Instructions (Signed)
  HOME CARE TIPS:  -[facility] COVID19 testing information: [email or web address] OR [phone] Most pharmacies also offer testing and home test kits. If the Covid19 test is positive, please make a prompt follow up visit with your primary care office or with [facility] to discuss treatment options. Treatments for Covid19 are best given early in the course of the illness.   -I sent the medication(s) we discussed to your pharmacy: Meds ordered this encounter  Medications   benzonatate (TESSALON PERLES) 100 MG capsule    Sig: Take 2 capsules (200 mg total) by mouth 3 (three) times daily as needed.    Dispense:  20 capsule    Refill:  0   -use your albuterol every 4-6 hours as needed  -can use tylenol if needed for fevers, aches and pains per instructions  -can use nasal saline a few times per day if you have nasal congestion; sometimes  a short course of Afrin nasal spray for 3 days can help with symptoms as well  -stay hydrated, drink plenty of fluids and eat small healthy meals - avoid dairy  -can take 1000 IU ( ) Vit D3 and 100-500 mg of Vit C daily per instructions  -If the Covid test is positive, check out the [facility] website for more information on home care, transmission and treatment for COVID19  -follow up with your doctor in 2-3 days unless improving and feeling better  -stay home while sick, except to seek medical care. If you have COVID19, ideally it would be best to stay home for a full 10 days since the onset of symptoms PLUS one day of no fever and feeling better. Wear a good mask that fits snugly (such as N95 or KN95) if around others to reduce the risk of transmission.  It was nice to meet you today, and I really hope you are feeling better soon. I help [facility] out with telemedicine visits on Tuesdays and Thursdays and am available for visits on those days. If you have any concerns or questions following this visit please schedule  a follow up visit with your Primary Care doctor or seek care at a local urgent care clinic to avoid delays in care.    Seek in person care or schedule a follow up video visit promptly if your symptoms worsen, new concerns arise or you are not improving with treatment. Call 911 and/or seek emergency care if your symptoms are severe or life threatening.

## 2021-07-02 NOTE — Progress Notes (Signed)
Virtual Visit via Video Note  I connected with [name]  on 07/02/21 at 12:00 PM EDT by a video enabled telemedicine application and verified that I am speaking with the correct person using two identifiers.  Location patient: home, [place] Location provider:work or home office Persons participating in the virtual visit: patient, provider  I discussed the limitations of evaluation and management by telemedicine and the availability of in person appointments. The patient expressed understanding and agreed to proceed.   HPI:  Acute telemedicine visit for nasal congestion and cough: -Onset: 2 days ago; did a home covid test 2 days ago and it was negative -Symptoms include: nasal congestion, cough - used his albuterol last night -Denies: fever, CP, SOB, NVD, inability to eat/drink/get out of bed -Has tried: albuterol once, tylenol cold and flu -Pertinent past medical history: hx of asthma - uses albuterol as needed -Pertinent medication allergies: No Known Allergies -COVID-19 vaccine status: had two doses of vaccine  ROS: See pertinent positives and negatives per HPI.  Past Medical History:  Diagnosis Date   ALLERGIC RHINITIS 12/31/2007   Chronic combined systolic and diastolic heart failure (HCC) 06/14/2021   COUGH, CHRONIC 07/18/2007   DEGENERATIVE JOINT DISEASE 07/18/2007   Extrinsic asthma, unspecified 09/08/2009   GERD 07/18/2007   HYPERLIPIDEMIA 08/14/2010   Hypertension    Impaired glucose tolerance 09/23/2011   OBESITY 07/18/2007    Past Surgical History:  Procedure Laterality Date   BACK SURGERY     CARDIOVERSION N/A 12/28/2020   Procedure: CARDIOVERSION;  Surgeon: Chilton Si, MD;  Location: [facility] ENDOSCOPY;  Service: Cardiovascular;  Laterality: N/A;   DG FEMUR LEFT  ([facility] HX)     rod placed   TEE WITHOUT CARDIOVERSION N/A 12/28/2020   Procedure: TRANSESOPHAGEAL ECHOCARDIOGRAM (TEE);  Surgeon: Chilton Si, MD;  Location: [facility] ENDOSCOPY;  Service: Cardiovascular;   Laterality: N/A;   TOOTH EXTRACTION     TOTAL KNEE ARTHROPLASTY Right 07/26/2018   Procedure: RIGHT TOTAL KNEE ARTHROPLASTY;  Surgeon: Kerrin Champagne, MD;  Location: [facility] OR;  Service: Orthopedics;  Laterality: Right;     Current Outpatient Medications:    albuterol (PROVENTIL HFA;VENTOLIN HFA) 108 (90 Base) MCG/ACT inhaler, Inhale 2 puffs into the lungs every 6 (six) hours as needed for wheezing or shortness of breath., Disp: 3 Inhaler, Rfl: 3   aspirin EC 81 MG tablet, Take 81 mg by mouth daily. Swallow whole., Disp: , Rfl:    baclofen (LIORESAL) 10 MG tablet, Take 10 mg by mouth daily as needed for muscle spasms., Disp: , Rfl:    benzonatate (TESSALON PERLES) 100 MG capsule, Take 2 capsules (200 mg total) by mouth 3 (three) times daily as needed., Disp: 20 capsule, Rfl: 0   budesonide-formoterol (SYMBICORT) 80-4.5 MCG/ACT inhaler, Inhale 2 puffs into the lungs 2 (two) times daily. 1-2 puffs Daily, Disp: , Rfl:    diclofenac Sodium (VOLTAREN) 1 % GEL, Apply 2 g topically daily as needed (pain)., Disp: , Rfl:    esomeprazole (NEXIUM) 40 MG capsule, Take 40 mg by mouth daily at 12 noon. Take 1 Daily, Disp: , Rfl:    gabapentin (NEURONTIN) 300 MG capsule, Take 1 capsule (300 mg total) by mouth at bedtime. 1 capsule at bedtime, Disp: 180 capsule, Rfl: 3   metoprolol succinate (TOPROL-XL) 25 MG 24 hr tablet, Take 1 tablet (25 mg total) by mouth daily., Disp: 90 tablet, Rfl: 3   Multiple Vitamins-Minerals (CENTRUM ADULTS PO), Take by mouth., Disp: , Rfl:    NAPROXEN PO, Take  by mouth as needed., Disp: , Rfl:   EXAM:  VITALS per patient if applicable:  GENERAL: alert, oriented, appears well and in no acute distress  HEENT: atraumatic, conjunttiva clear, no obvious abnormalities on inspection of external nose and ears  NECK: normal movements of the head and neck  LUNGS: on inspection no signs of respiratory distress, breathing rate appears normal, no obvious gross SOB, gasping or wheezing  CV:  no obvious cyanosis  MS: moves all visible extremities without noticeable abnormality  PSYCH/NEURO: pleasant and cooperative, no obvious depression or anxiety, speech and thought processing grossly intact  ASSESSMENT AND PLAN:  Discussed the following assessment and plan:  Nasal congestion  Cough  -we discussed possible serious and likely etiologies, options for evaluation and workup, limitations of telemedicine visit vs in person visit, treatment, treatment risks and precautions. Pt prefers to treat via telemedicine empirically rather than in person at this moment. Query VURI, [id number] with false neg early testing vs other. Opted for Tessalon for cough, alb q 4-6 hours prn, other symptomatic measures per pt instructions and retesting for covid (he plans to do this tomorrow). Advised follow up video visit to discus antiviral if covid test is positive. Advised to stay home while sick and until 2nd home test for covid neg.  Work/School slipped offered: declined Advised to seek prompt in person care if worsening, new symptoms arise, or if is not improving with treatment. Discussed options for inperson care if PCP office not available. Did let this patient know that I only do telemedicine on Tuesdays and Thursdays for [place]. Advised to schedule follow up visit with PCP or [facility] if any further questions or concerns to avoid delays in care.   I discussed the assessment and treatment plan with the patient. The patient was provided an opportunity to ask questions and all were answered. The patient agreed with the plan and demonstrated an understanding of the instructions.     Terressa Koyanagi, DO

## 2021-07-03 ENCOUNTER — Telehealth: Payer: Self-pay | Admitting: Internal Medicine

## 2021-07-03 ENCOUNTER — Encounter: Payer: Self-pay | Admitting: Internal Medicine

## 2021-07-03 MED ORDER — NIRMATRELVIR/RITONAVIR (PAXLOVID)TABLET: 3.0000 | ORAL_TABLET | Freq: Two times a day (BID) | ORAL | 0 refills | Status: AC

## 2021-07-03 MED ORDER — ESOMEPRAZOLE MAGNESIUM 40 MG PO CPDR: 40.0000 mg | DELAYED_RELEASE_CAPSULE | Freq: Every day | ORAL | 1 refills | Status: DC

## 2021-07-03 NOTE — Telephone Encounter (Signed)
   Patient calling to report covid+ 8/3 Patient had virtual visit with Dr Selena Batten 8/2 He has cough, congestion and sore throat. Requesting  medication be prescribed

## 2021-07-03 NOTE — Telephone Encounter (Signed)
Ok done to [employer]

## 2021-07-12 ENCOUNTER — Other Ambulatory Visit: Payer: Self-pay | Admitting: Family Medicine

## 2021-08-21 ENCOUNTER — Other Ambulatory Visit (HOSPITAL_BASED_OUTPATIENT_CLINIC_OR_DEPARTMENT_OTHER): Payer: Self-pay | Admitting: *Deleted

## 2021-08-21 DIAGNOSIS — R918 Other nonspecific abnormal finding of lung field: Secondary | ICD-10-CM

## 2021-08-22 ENCOUNTER — Telehealth (HOSPITAL_BASED_OUTPATIENT_CLINIC_OR_DEPARTMENT_OTHER): Payer: Self-pay | Admitting: Cardiovascular Disease

## 2021-08-22 NOTE — Telephone Encounter (Signed)
Spoke with patient regarding CT chest appointment Tuesday 09/17/21 at 2:30 pm here at the [facility]--arrival time is 2:15 pm for check in.  Will mail information to patient and he voiced his understanding.

## 2021-09-17 ENCOUNTER — Other Ambulatory Visit: Payer: Self-pay

## 2021-09-17 ENCOUNTER — Ambulatory Visit (HOSPITAL_BASED_OUTPATIENT_CLINIC_OR_DEPARTMENT_OTHER)
Admission: RE | Admit: 2021-09-17 | Discharge: 2021-09-17 | Disposition: A | Discharge status: Home / Self Care | Payer: BC Managed Care – PPO | Source: Ambulatory Visit | Attending: Cardiovascular Disease | Admitting: Cardiovascular Disease

## 2021-09-17 DIAGNOSIS — R911 Solitary pulmonary nodule: Secondary | ICD-10-CM | POA: Diagnosis not present

## 2021-09-17 DIAGNOSIS — L814 Other melanin hyperpigmentation: Secondary | ICD-10-CM | POA: Diagnosis not present

## 2021-09-17 DIAGNOSIS — R918 Other nonspecific abnormal finding of lung field: Secondary | ICD-10-CM | POA: Insufficient documentation

## 2021-09-17 DIAGNOSIS — J439 Emphysema, unspecified: Secondary | ICD-10-CM | POA: Diagnosis not present

## 2021-09-17 DIAGNOSIS — I700 Atherosclerosis of aorta: Secondary | ICD-10-CM | POA: Diagnosis not present

## 2021-09-17 DIAGNOSIS — L7211 Pilar cyst: Secondary | ICD-10-CM | POA: Diagnosis not present

## 2021-09-17 DIAGNOSIS — L821 Other seborrheic keratosis: Secondary | ICD-10-CM | POA: Diagnosis not present

## 2021-09-17 DIAGNOSIS — D225 Melanocytic nevi of trunk: Secondary | ICD-10-CM | POA: Diagnosis not present

## 2021-10-08 ENCOUNTER — Telehealth (INDEPENDENT_AMBULATORY_CARE_PROVIDER_SITE_OTHER): Payer: BC Managed Care – PPO | Admitting: Family Medicine

## 2021-10-08 ENCOUNTER — Encounter: Payer: Self-pay | Admitting: Family Medicine

## 2021-10-08 VITALS — Temp 96.8°F

## 2021-10-08 DIAGNOSIS — R0981 Nasal congestion: Secondary | ICD-10-CM

## 2021-10-08 DIAGNOSIS — R059 Cough, unspecified: Secondary | ICD-10-CM

## 2021-10-08 MED ORDER — BENZONATATE 100 MG PO CAPS: ORAL_CAPSULE | ORAL | 0 refills | Status: DC

## 2021-10-08 NOTE — Patient Instructions (Signed)
---------------------------------------------------------------------------------------------------------------------------    WORK SLIP:  Patient [name],  18-Nov-1951, was seen for a medical visit today, 10/08/21 . Please excuse from work for a COVID/flu like illness.  Will defer to employer for return to work if patient has 2 negative covid tests 48 hours apart and is feeling better for 24 hours OR it is greater than 5 days since the positive test and the patient can wear a high-quality, tight fitting mask such as N95 or KN95 at all times for an additional 5 days.   Sincerely: E-signature: Dr. Colin Benton, DO [facility] Ph: [phone]   ------------------------------------------------------------------------------------------------------------------------------  INSTRUCTIONS FOR UPPER RESPIRATORY INFECTION:  -plenty of rest and fluids  -albuterol every 4-6 hours as needed  -nasal saline wash 2-3 times daily (use prepackaged nasal saline or bottled/distilled water if making your own)   -can use AFRIN nasal spray for drainage and nasal congestion - but do NOT use longer then 3-4 days  -can use tylenol (in no history of liver disease) or ibuprofen (if no history of kidney disease, bowel bleeding or significant heart disease) as directed for aches and sorethroat  -in the winter time, using a humidifier at night is helpful (please follow cleaning instructions)  -if you are taking a cough medication - use only as directed, may also try a teaspoon of honey to coat the throat and throat lozenges   -for sore throat, salt water gargles can help  -I sent the medication(s) we discussed to your pharmacy: Meds ordered this encounter  Medications   benzonatate (TESSALON PERLES) 100 MG capsule    Sig: 1-2 capsules up to twice daily as needed for cough    Dispense:  40 capsule    Refill:  0     I hope you are feeling better soon!  Seek in person  care promptly if your symptoms worsen, new concerns arise or you are not improving with treatment.  It was nice to meet you today. I help [facility] out with telemedicine visits on Tuesdays and Thursdays and am available for visits on those days. If you have any concerns or questions following this visit please schedule a follow up visit with your Primary Care doctor or seek care at a local urgent care clinic to avoid delays in care.    -follow up if you have fevers, facial pain, tooth pain, difficulty breathing or are worsening or symptoms persist longer then expected  Upper Respiratory Infection, Adult An upper respiratory infection (URI) is also known as the common cold. It is often caused by a type of germ (virus). Colds are easily spread (contagious). You can pass it to others by kissing, coughing, sneezing, or drinking out of the same glass. Usually, you get better in 1 to 3  weeks.  However, the cough can last for even longer. HOME CARE  Only take medicine as told by your doctor. Follow instructions provided above. Drink enough water and fluids to keep your pee (urine) clear or pale yellow. Get plenty of rest. Return to work when your temperature is < 100 for 24 hours or as told by your doctor. You may use a face mask and wash your hands to stop your cold from spreading. GET HELP RIGHT AWAY IF:  After the first few days, you feel you are getting worse. You have questions about your medicine. You have chills, shortness of breath, or red spit (mucus). You have pain in the face for more then 1-2  days, especially when you bend forward. You have a fever, puffy (swollen) neck, pain when you swallow, or white spots in the back of your throat. You have a bad headache, ear pain, sinus pain, or chest pain. You have a high-pitched whistling sound when you breathe in and out (wheezing). You cough up blood. You have sore muscles or a stiff neck. MAKE SURE YOU:  Understand these instructions. Will  watch your condition. Will get help right away if you are not doing well or get worse. Document Released: 05/05/2008 Document Revised: 02/09/2012 Document Reviewed: 02/22/2014 [facility] Patient Information 2015 [place], [place]. This information is not intended to replace advice given to you by your health care provider. Make sure you discuss any questions you have with your health care provider.

## 2021-10-08 NOTE — Progress Notes (Signed)
Virtual Visit via Video Note  I connected with [name]  on 10/08/21 at 11:40 AM EST by a video enabled telemedicine application and verified that I am speaking with the correct person using two identifiers.  Location patient: home, [place] Location provider:work or home office Persons participating in the virtual visit: patient, provider  I discussed the limitations of evaluation and management by telemedicine and the availability of in person appointments. The patient expressed understanding and agreed to proceed.   HPI:  Acute telemedicine visit for : -Onset:4 days -has had several negative covid tests -wife and grandkids are sick with the same -Symptoms include: nasal congestion, sore throat, malaise, cough, sneezing, feels tired -Denies:fevers, CP, SOB, NVD, body aches -Has tried:Dayquil, Nyquil -Pertinent past medical history: see below, gets bronchitis when sick - has albuterol to use if needed -Pertinent medication allergies: No Known Allergies -COVID-19 vaccine status: Immunization History  Administered Date(s) Administered   Influenza, High Dose Seasonal PF 08/10/2018   Influenza,inj,Quad PF,6+ Mos 01/17/2015   Influenza-Unspecified 12/01/2017   PFIZER(Purple Top)SARS-COV-2 Vaccination 03/01/2020, 03/26/2020   Pneumococcal Conjugate-13 06/15/2020   Pneumococcal Polysaccharide-23 07/28/2018   Tdap 01/17/2015     ROS: See pertinent positives and negatives per HPI.  Past Medical History:  Diagnosis Date   ALLERGIC RHINITIS 12/31/2007   Chronic combined systolic and diastolic heart failure (HCC) 06/14/2021   COUGH, CHRONIC 07/18/2007   DEGENERATIVE JOINT DISEASE 07/18/2007   Extrinsic asthma, unspecified 09/08/2009   GERD 07/18/2007   HYPERLIPIDEMIA 08/14/2010   Hypertension    Impaired glucose tolerance 09/23/2011   OBESITY 07/18/2007    Past Surgical History:  Procedure Laterality Date   BACK SURGERY     CARDIOVERSION N/A 12/28/2020   Procedure: CARDIOVERSION;  Surgeon:  Chilton Si, MD;  Location: [facility] ENDOSCOPY;  Service: Cardiovascular;  Laterality: N/A;   DG FEMUR LEFT  ([facility] HX)     rod placed   TEE WITHOUT CARDIOVERSION N/A 12/28/2020   Procedure: TRANSESOPHAGEAL ECHOCARDIOGRAM (TEE);  Surgeon: Chilton Si, MD;  Location: [facility] ENDOSCOPY;  Service: Cardiovascular;  Laterality: N/A;   TOOTH EXTRACTION     TOTAL KNEE ARTHROPLASTY Right 07/26/2018   Procedure: RIGHT TOTAL KNEE ARTHROPLASTY;  Surgeon: Kerrin Champagne, MD;  Location: [facility] OR;  Service: Orthopedics;  Laterality: Right;     Current Outpatient Medications:    Acetaminophen (TYLENOL PO), Take by mouth daily., Disp: , Rfl:    albuterol (PROVENTIL HFA;VENTOLIN HFA) 108 (90 Base) MCG/ACT inhaler, Inhale 2 puffs into the lungs every 6 (six) hours as needed for wheezing or shortness of breath., Disp: 3 Inhaler, Rfl: 3   aspirin EC 81 MG tablet, Take 81 mg by mouth daily. Swallow whole., Disp: , Rfl:    baclofen (LIORESAL) 10 MG tablet, Take 10 mg by mouth daily as needed for muscle spasms., Disp: , Rfl:    benzonatate (TESSALON PERLES) 100 MG capsule, 1-2 capsules up to twice daily as needed for cough, Disp: 40 capsule, Rfl: 0   budesonide-formoterol (SYMBICORT) 80-4.5 MCG/ACT inhaler, Inhale 2 puffs into the lungs 2 (two) times daily. 1-2 puffs Daily, Disp: , Rfl:    diclofenac Sodium (VOLTAREN) 1 % GEL, Apply 2 g topically daily as needed (pain)., Disp: , Rfl:    esomeprazole (NEXIUM) 40 MG capsule, Take 1 capsule (40 mg total) by mouth daily at 12 noon. Take 1 Daily, Disp: 90 capsule, Rfl: 1   gabapentin (NEURONTIN) 300 MG capsule, Take 1 capsule (300 mg total) by mouth at bedtime. 1 capsule at bedtime,  Disp: 180 capsule, Rfl: 3   metoprolol succinate (TOPROL-XL) 25 MG 24 hr tablet, Take 1 tablet (25 mg total) by mouth daily., Disp: 90 tablet, Rfl: 3   Multiple Vitamins-Minerals (CENTRUM ADULTS PO), Take by mouth., Disp: , Rfl:    NAPROXEN PO, Take by mouth at bedtime., Disp: , Rfl:    EXAM:  VITALS per patient if applicable: 105/69, HR 82, T 96.4  GENERAL: alert, oriented, appears well and in no acute distress  HEENT: atraumatic, conjunttiva clear, no obvious abnormalities on inspection of external nose and ears  NECK: normal movements of the head and neck  LUNGS: on inspection no signs of respiratory distress, breathing rate appears normal, no obvious gross SOB, gasping or wheezing  CV: no obvious cyanosis  MS: moves all visible extremities without noticeable abnormality  PSYCH/NEURO: pleasant and cooperative, no obvious depression or anxiety, speech and thought processing grossly intact  ASSESSMENT AND PLAN:  Discussed the following assessment and plan:  Cough, unspecified type  Nasal congestion  -we discussed possible serious and likely etiologies, options for evaluation and workup, limitations of telemedicine visit vs in person visit, treatment, treatment risks and precautions. Pt is agreeable to treatment via telemedicine at this moment. Query VURI vs other. Opted for treatment with Tessalon for cough, alb every 4-6 hours if needed and other symptomatic care measures per patient instructions.  Work/School slipped offered: provided in patient instructions    Advised to seek prompt in person care if worsening, new symptoms arise, or if is not improving with treatment. Discussed options for inperson care if PCP office not available. Did let this patient know that I only do telemedicine on Tuesdays and Thursdays for [place]. Advised to schedule follow up visit with PCP or [facility] if any further questions or concerns to avoid delays in care.   I discussed the assessment and treatment plan with the patient. The patient was provided an opportunity to ask questions and all were answered. The patient agreed with the plan and demonstrated an understanding of the instructions.     Terressa Koyanagi, DO

## 2021-10-28 ENCOUNTER — Other Ambulatory Visit: Payer: Self-pay

## 2021-10-28 ENCOUNTER — Ambulatory Visit: Payer: BC Managed Care – PPO | Admitting: Specialist

## 2021-10-28 ENCOUNTER — Encounter: Payer: Self-pay | Admitting: Specialist

## 2021-10-28 ENCOUNTER — Ambulatory Visit: Payer: Self-pay

## 2021-10-28 VITALS — BP 130/82 | HR 69 | Ht 73.0 in | Wt 229.0 lb

## 2021-10-28 DIAGNOSIS — M7541 Impingement syndrome of right shoulder: Secondary | ICD-10-CM | POA: Diagnosis not present

## 2021-10-28 DIAGNOSIS — M19011 Primary osteoarthritis, right shoulder: Secondary | ICD-10-CM | POA: Diagnosis not present

## 2021-10-28 MED ORDER — BUPIVACAINE HCL 0.5 % IJ SOLN
3.0000 mL | INTRAMUSCULAR | Status: AC | PRN
  Administered 2021-10-28: 16:00:00 3 mL via INTRA_ARTICULAR

## 2021-10-28 MED ORDER — METHYLPREDNISOLONE ACETATE 40 MG/ML IJ SUSP
40.0000 mg | INTRAMUSCULAR | Status: AC | PRN
  Administered 2021-10-28: 16:00:00 40 mg via INTRA_ARTICULAR

## 2021-10-28 NOTE — Progress Notes (Signed)
Office Visit Note   Patient: [name]           Date of Birth: May 26, 1951           MRN: [id number] Visit Date: 10/28/2021              Requested by: Biagio Borg, MD [address] [place],   [address] PCP: Biagio Borg, MD   Assessment & Plan: Visit Diagnoses:  1. Primary osteoarthritis, right shoulder       Post-Op Visit Note   Patient: [name]           Date of Birth: 26-Feb-1951           MRN: [id number] Visit Date: 10/28/2021 PCP: Biagio Borg, MD   Assessment & Plan:  Chief Complaint:  Chief Complaint  Patient presents with   Right Shoulder - Pain   Visit Diagnoses:  1. Primary osteoarthritis, right shoulder   2. Impingement syndrome of right shoulder     Plan: Plan: Avoid overhead lifting and overhead use of the arms. Pillows to keep from sleeping directly on the shoulders Limited lifting to less than 10 lbs. Ice or heat for relief. Stretching exercise help and strengthening is helpful to build endurance.   Follow-Up Instructions: Return in about 2 years (around 10/29/2023), or Please schedule this patient an appointment to see Dr. Marlou Sa for right shoulder osteoarthritis..   Orders:  Orders Placed This Encounter  Procedures   Large Joint Inj: R glenohumeral   XR Shoulder Right   No orders of the defined types were placed in this encounter.   Imaging: No results found.  PMFS History: Patient Active Problem List   Diagnosis Date Noted   Unilateral primary osteoarthritis, right knee 07/26/2018    Priority: High    Class: End Stage   Anemia due to blood loss, acute 07/29/2018    Priority: Medium     Class: Acute   Chronic combined systolic and diastolic heart failure ([place]) 06/14/2021   Persistent atrial fibrillation (HCC)    Atrial flutter ([place]) 12/24/2020   Secondary hypercoagulable state ([place]) 12/24/2020   Tachycardia 12/21/2020   DOE (dyspnea on exertion) 12/21/2020   DVT (deep venous thrombosis) ([place]) 07/28/2018    S/P TKR (total knee replacement) using cement, right 07/26/2018   Flu-like symptoms 01/23/2018   Dyspnea 01/07/2018   HTN (hypertension) 10/30/2016   Cough 10/30/2016   Asthma with exacerbation 10/30/2016   Edema 11/22/2015   Erectile dysfunction 01/17/2015   Corneal abrasion, right, sequela 06/27/2014   Encounter for well adult exam with abnormal findings 09/23/2011   Impaired glucose tolerance 09/23/2011   Hyperlipidemia 08/14/2010   Extrinsic asthma 09/08/2009   ALLERGIC RHINITIS 12/31/2007   OBESITY 07/18/2007   GERD 07/18/2007   DEGENERATIVE JOINT DISEASE 07/18/2007   Past Medical History:  Diagnosis Date   ALLERGIC RHINITIS 12/31/2007   Chronic combined systolic and diastolic heart failure ([facility]) 06/14/2021   COUGH, CHRONIC 07/18/2007   DEGENERATIVE JOINT DISEASE 07/18/2007   Extrinsic asthma, unspecified 09/08/2009   GERD 07/18/2007   HYPERLIPIDEMIA 08/14/2010   Hypertension    Impaired glucose tolerance 09/23/2011   OBESITY 07/18/2007    History reviewed. No pertinent family history.  Past Surgical History:  Procedure Laterality Date   BACK SURGERY     CARDIOVERSION N/A 12/28/2020   Procedure: CARDIOVERSION;  Surgeon: Skeet Latch, MD;  Location: [place];  Service: Cardiovascular;  Laterality: N/A;   DG FEMUR LEFT  ([facility]  HX)     rod placed   TEE WITHOUT CARDIOVERSION N/A 12/28/2020   Procedure: TRANSESOPHAGEAL ECHOCARDIOGRAM (TEE);  Surgeon: Chilton Si, MD;  Location: [facility] ENDOSCOPY;  Service: Cardiovascular;  Laterality: N/A;   TOOTH EXTRACTION     TOTAL KNEE ARTHROPLASTY Right 07/26/2018   Procedure: RIGHT TOTAL KNEE ARTHROPLASTY;  Surgeon: Kerrin Champagne, MD;  Location: [facility] OR;  Service: Orthopedics;  Laterality: Right;   Social History   Occupational History   Occupation: truck Hospital doctor  Tobacco Use   Smoking status: Former   Smokeless tobacco: Never   Tobacco comments:    Marijuana 30 years  Vaping Use   Vaping Use: Never used  Substance and  Sexual Activity   Alcohol use: Yes    Alcohol/week: 2.0 - 4.0 standard drinks    Types: 2 - 4 Cans of beer per week   Drug use: Never   Sexual activity: Not on file      Follow-Up Instructions: No follow-ups on file.   Orders:  Orders Placed This Encounter  Procedures   XR Shoulder Right   No orders of the defined types were placed in this encounter.     Procedures: Large Joint Inj: R glenohumeral on 10/28/2021 3:58 PM Indications: pain Details: 25 G 1.5 in needle, anterolateral approach  Arthrogram: No  Medications: 40 mg methylPREDNISolone acetate 40 MG/ML; 3 mL bupivacaine 0.5 % Outcome: tolerated well, no immediate complications  Right shoulder bandaid applied.  Procedure, treatment alternatives, risks and benefits explained, specific risks discussed. Consent was given by the patient. Immediately prior to procedure a time out was called to verify the correct patient, procedure, equipment, support staff and site/side marked as required. Patient was prepped and draped in the usual sterile fashion.     Clinical Data: Findings:  CT scan of the chest for rule out PE from 12/2020 Shows right shoulder with severe joint line narrowing, cystic changes subchondral  humeral and glenoid.    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 He has had intermittant right shoulder pain. Pain with over head lifting and over head use of the right shoulder and arm. Pain with lifting the doors on the trucks and pain with lying on the right shoulder. Grating and  Popping right shoulder.   Review of Systems  Constitutional: Negative.   HENT: Negative.    Eyes: Negative.   Respiratory: Negative.    Cardiovascular: Negative.   Gastrointestinal: Negative.   Endocrine: Negative.   Genitourinary: Negative.   Musculoskeletal: Negative.   Skin: Negative.    Allergic/Immunologic: Negative.   Neurological: Negative.   Hematological: Negative.   Psychiatric/Behavioral: Negative.      Objective: Vital Signs: BP 130/82 (BP Location: Left Arm, Patient Position: Sitting)   Pulse 69   Ht 6\' 1"  (1.854 m)   Wt 229 lb (103.9 kg)   BMI 30.21 kg/m   Physical Exam Constitutional:      Appearance: He is well-developed.  HENT:     Head: Normocephalic and atraumatic.  Eyes:     Pupils: Pupils are equal, round, and reactive to light.  Pulmonary:     Effort: Pulmonary effort is normal.     Breath sounds: Normal breath sounds.  Abdominal:     General: Bowel sounds are normal.     Palpations: Abdomen is soft.  Musculoskeletal:     Cervical  back: Normal range of motion and neck supple.  Skin:    General: Skin is warm and dry.  Neurological:     Mental Status: He is alert and oriented to person, place, and time.  Psychiatric:        Behavior: Behavior normal.        Thought Content: Thought content normal.        Judgment: Judgment normal.   Right Shoulder Exam   Tenderness  The patient is experiencing tenderness in the acromioclavicular joint and acromion.  Range of Motion  Active abduction:  abnormal  Passive abduction:  abnormal  Extension:  abnormal  External rotation:  abnormal  Forward flexion:  abnormal  Internal rotation 0 degrees:  abnormal  Internal rotation 90 degrees:  abnormal   Muscle Strength  Abduction: 4/5  Internal rotation: 4/5  External rotation: 5/5  Supraspinatus: 4/5  Subscapularis: 5/5  Biceps: 5/5   Tests  Apprehension: negative Hawkins test: negative Cross arm: negative Impingement: negative Drop arm: negative Sulcus: absent  Other  Erythema: absent Scars: absent Sensation: normal Pulse: present    Specialty Comments:  No specialty comments available.  Imaging: No results found.   PMFS History: Patient Active Problem List   Diagnosis Date Noted   Unilateral primary  osteoarthritis, right knee 07/26/2018    Priority: High    Class: End Stage   Anemia due to blood loss, acute 07/29/2018    Priority: Medium     Class: Acute   Chronic combined systolic and diastolic heart failure (HCC) 06/14/2021   Persistent atrial fibrillation (HCC)    Atrial flutter (HCC) 12/24/2020   Secondary hypercoagulable state (HCC) 12/24/2020   Tachycardia 12/21/2020   DOE (dyspnea on exertion) 12/21/2020   DVT (deep venous thrombosis) (HCC) 07/28/2018   S/P TKR (total knee replacement) using cement, right 07/26/2018   Flu-like symptoms 01/23/2018   Dyspnea 01/07/2018   HTN (hypertension) 10/30/2016   Cough 10/30/2016   Asthma with exacerbation 10/30/2016   Edema 11/22/2015   Erectile dysfunction 01/17/2015   Corneal abrasion, right, sequela 06/27/2014   Encounter for well adult exam with abnormal findings 09/23/2011   Impaired glucose tolerance 09/23/2011   Hyperlipidemia 08/14/2010   Extrinsic asthma 09/08/2009   ALLERGIC RHINITIS 12/31/2007   OBESITY 07/18/2007   GERD 07/18/2007   DEGENERATIVE JOINT DISEASE 07/18/2007   Past Medical History:  Diagnosis Date   ALLERGIC RHINITIS 12/31/2007   Chronic combined systolic and diastolic heart failure (HCC) 06/14/2021   COUGH, CHRONIC 07/18/2007   DEGENERATIVE JOINT DISEASE 07/18/2007   Extrinsic asthma, unspecified 09/08/2009   GERD 07/18/2007   HYPERLIPIDEMIA 08/14/2010   Hypertension    Impaired glucose tolerance 09/23/2011   OBESITY 07/18/2007    History reviewed. No pertinent family history.  Past Surgical History:  Procedure Laterality Date   BACK SURGERY     CARDIOVERSION N/A 12/28/2020   Procedure: CARDIOVERSION;  Surgeon: Chilton Siandolph, Tiffany, MD;  [facility];  Service: Cardiovascular;  Laterality: N/A;   DG FEMUR LEFT  ([facility] HX)     rod placed   TEE WITHOUT CARDIOVERSION N/A 12/28/2020   Procedure: TRANSESOPHAGEAL ECHOCARDIOGRAM (TEE);  Surgeon: Chilton Siandolph, Tiffany, MD;  [facility];   Service: Cardiovascular;  Laterality: N/A;   TOOTH EXTRACTION     TOTAL KNEE ARTHROPLASTY Right 07/26/2018   Procedure: RIGHT TOTAL KNEE ARTHROPLASTY;  Surgeon: Kerrin ChampagneNitka, Ayinde Swim E, MD;  Location: [facility] OR;  Service: Orthopedics;  Laterality: Right;   Social History   Occupational History  Occupation: truck Hospital doctor  Tobacco Use   Smoking status: Former   Smokeless tobacco: Never   Tobacco comments:    Marijuana 30 years  Vaping Use   Vaping Use: Never used  Substance and Sexual Activity   Alcohol use: Yes    Alcohol/week: 2.0 - 4.0 standard drinks    Types: 2 - 4 Cans of beer per week   Drug use: Never   Sexual activity: Not on file

## 2021-10-28 NOTE — Patient Instructions (Signed)
Avoid overhead lifting and overhead use of the arms. Pillows to keep from sleeping directly on the shoulders Limited lifting to less than 10 lbs. Ice or heat for relief. Stretching exercise help and strengthening is helpful to build endurance.

## 2021-11-11 ENCOUNTER — Other Ambulatory Visit: Payer: Self-pay

## 2021-11-11 ENCOUNTER — Ambulatory Visit (INDEPENDENT_AMBULATORY_CARE_PROVIDER_SITE_OTHER): Payer: BC Managed Care – PPO | Admitting: Orthopedic Surgery

## 2021-11-11 DIAGNOSIS — M19011 Primary osteoarthritis, right shoulder: Secondary | ICD-10-CM | POA: Diagnosis not present

## 2021-11-11 DIAGNOSIS — M25511 Pain in right shoulder: Secondary | ICD-10-CM

## 2021-11-13 ENCOUNTER — Encounter: Payer: Self-pay | Admitting: Orthopedic Surgery

## 2021-11-13 NOTE — Progress Notes (Signed)
Office Visit Note   Patient: [name]           Date of Birth: 19-Mar-1951           MRN: [id number] Visit Date: 11/11/2021 Requested by: Joseph Levins, MD [address] [place],  [place] [address] PCP: Joseph Levins, MD  Subjective: Chief Complaint  Patient presents with   Right Shoulder - Pain    HPI: [name] is a 70 year old patient with right shoulder pain.  Had an injection 10/28/2021 which has been helpful.  Reports chronic pain with the shoulder.  He has been working as a Naval architect for 43 years.  Planning on retiring possibly in April.  He is left-hand dominant.  The pain wakes him from sleep at night.  Hard for him to sleep on the right-hand side.  Previous radiographs do demonstrate maintenance of acromiohumeral interval but bone-on-bone arthritic changes present.  In the glenohumeral joint.              ROS: All systems reviewed are negative as they relate to the chief complaint within the history of present illness.  Patient denies  fevers or chills.   Assessment & Plan: Visit Diagnoses:  1. Primary osteoarthritis, right shoulder     Plan: Impression is right shoulder arthritis.  Patient really wants to avoid shoulder replacement if possible.  I did tell him that ultrasound or fluoroscopically guided intra-articular injection would be a good way to go every 4 months to try to mitigate his pain.  His pain may improve also after retiring from what sounds like a fairly physically demanding job.  Plan at this time to schedule him for intra-articular injection with Dr. Alvester [name] in mid March.  Follow-up with me as needed.  Follow-Up Instructions: Return if symptoms worsen or fail to improve.   Orders:  No orders of the defined types were placed in this encounter.  No orders of the defined types were placed in this encounter.     Procedures: No procedures performed   Clinical Data: No additional findings.  Objective: Vital Signs: There were no vitals taken  for this visit.  Physical Exam:   Constitutional: Patient appears well-developed HEENT:  Head: Normocephalic Eyes:EOM are normal Neck: Normal range of motion Cardiovascular: Normal rate Pulmonary/chest: Effort normal Neurologic: Patient is alert Skin: Skin is warm Psychiatric: Patient has normal mood and affect   Ortho Exam: Ortho exam demonstrates excellent rotator cuff strength on the right to infraspinatus supraspinatus and subscap muscle testing.  Passive range of motion is 30/80/150.  Does have a little coarseness and popping consistent with bone-on-bone arthritic change.  No discrete AC joint tenderness.  Deltoid is functional.  Specialty Comments:  No specialty comments available.  Imaging: No results found.   PMFS History: Patient Active Problem List   Diagnosis Date Noted   Chronic combined systolic and diastolic heart failure (HCC) 06/14/2021   Persistent atrial fibrillation (HCC)    Atrial flutter (HCC) 12/24/2020   Secondary hypercoagulable state (HCC) 12/24/2020   Tachycardia 12/21/2020   DOE (dyspnea on exertion) 12/21/2020   Anemia due to blood loss, acute 07/29/2018    Class: Acute   DVT (deep venous thrombosis) (HCC) 07/28/2018   Unilateral primary osteoarthritis, right knee 07/26/2018    Class: End Stage   S/P TKR (total knee replacement) using cement, right 07/26/2018   Flu-like symptoms 01/23/2018   Dyspnea 01/07/2018   HTN (hypertension) 10/30/2016   Cough 10/30/2016   Asthma with exacerbation  10/30/2016   Edema 11/22/2015   Erectile dysfunction 01/17/2015   Corneal abrasion, right, sequela 06/27/2014   Encounter for well adult exam with abnormal findings 09/23/2011   Impaired glucose tolerance 09/23/2011   Hyperlipidemia 08/14/2010   Extrinsic asthma 09/08/2009   ALLERGIC RHINITIS 12/31/2007   OBESITY 07/18/2007   GERD 07/18/2007   DEGENERATIVE JOINT DISEASE 07/18/2007   Past Medical History:  Diagnosis Date   ALLERGIC RHINITIS  12/31/2007   Chronic combined systolic and diastolic heart failure ([place]) 06/14/2021   COUGH, CHRONIC 07/18/2007   DEGENERATIVE JOINT DISEASE 07/18/2007   Extrinsic asthma, unspecified 09/08/2009   GERD 07/18/2007   HYPERLIPIDEMIA 08/14/2010   Hypertension    Impaired glucose tolerance 09/23/2011   OBESITY 07/18/2007    History reviewed. No pertinent family history.  Past Surgical History:  Procedure Laterality Date   BACK SURGERY     CARDIOVERSION N/A 12/28/2020   Procedure: CARDIOVERSION;  Surgeon: Skeet Latch, MD;  Location: [facility];  Service: Cardiovascular;  Laterality: N/A;   DG FEMUR LEFT  ([place] [place] [facility])     rod placed   TEE WITHOUT CARDIOVERSION N/A 12/28/2020   Procedure: TRANSESOPHAGEAL ECHOCARDIOGRAM (TEE);  Surgeon: Skeet Latch, MD;  Location: [facility];  Service: Cardiovascular;  Laterality: N/A;   TOOTH EXTRACTION     TOTAL KNEE ARTHROPLASTY Right 07/26/2018   Procedure: RIGHT TOTAL KNEE ARTHROPLASTY;  Surgeon: Jessy Oto, MD;  Location: [facility];  Service: Orthopedics;  Laterality: Right;   Social History   Occupational History   Occupation: truck Geophysicist/field seismologist  Tobacco Use   Smoking status: Former   Smokeless tobacco: Never   Tobacco comments:    Marijuana 30 years  Vaping Use   Vaping Use: Never used  Substance and Sexual Activity   Alcohol use: Yes    Alcohol/week: 2.0 - 4.0 standard drinks    Types: 2 - 4 Cans of beer per week   Drug use: Never   Sexual activity: Not on file

## 2021-12-28 ENCOUNTER — Encounter: Payer: Self-pay | Admitting: Emergency Medicine

## 2021-12-28 ENCOUNTER — Ambulatory Visit
Admission: EM | Admit: 2021-12-28 | Discharge: 2021-12-28 | Disposition: A | Discharge status: Home / Self Care | Payer: BC Managed Care – PPO | Attending: Emergency Medicine | Admitting: Emergency Medicine

## 2021-12-28 DIAGNOSIS — J209 Acute bronchitis, unspecified: Secondary | ICD-10-CM | POA: Diagnosis not present

## 2021-12-28 DIAGNOSIS — J45901 Unspecified asthma with (acute) exacerbation: Secondary | ICD-10-CM

## 2021-12-28 DIAGNOSIS — I10 Essential (primary) hypertension: Secondary | ICD-10-CM

## 2021-12-28 MED ORDER — AZITHROMYCIN 250 MG PO TABS: 250.0000 mg | ORAL_TABLET | Freq: Every day | ORAL | 0 refills | Status: DC

## 2021-12-28 MED ORDER — PREDNISONE 10 MG PO TABS: 40.0000 mg | ORAL_TABLET | Freq: Every day | ORAL | 0 refills | Status: AC

## 2021-12-28 NOTE — ED Provider Notes (Signed)
[facility]    CSN: [id number] Arrival date & time: 12/28/21  1311      History   Chief Complaint Chief Complaint  Patient presents with   Generalized Body Aches   Cough   Nasal Congestion   Fatigue    HPI CORDERRA VONSTEIN is a 71 y.o. male.  Patient presents with 2 to 3-day history of congestion, wheezing, cough, body aches, fatigue.  No fever, chills, rash, chest pain, shortness of breath, or other symptoms.  Treatment at home with generic DayQuil and NyQuil.  His medical history includes asthma, chronic cough, hypertension, atrial fibrillation, heart failure, DVT.  The history is provided by the patient and medical records.   Past Medical History:  Diagnosis Date   ALLERGIC RHINITIS 12/31/2007   Chronic combined systolic and diastolic heart failure ([place]) 06/14/2021   COUGH, CHRONIC 07/18/2007   DEGENERATIVE JOINT DISEASE 07/18/2007   Extrinsic asthma, unspecified 09/08/2009   GERD 07/18/2007   HYPERLIPIDEMIA 08/14/2010   Hypertension    Impaired glucose tolerance 09/23/2011   OBESITY 07/18/2007    Patient Active Problem List   Diagnosis Date Noted   Chronic combined systolic and diastolic heart failure ([place]) 06/14/2021   Persistent atrial fibrillation ([place])    Atrial flutter ([place]) 12/24/2020   Secondary hypercoagulable state ([place]) 12/24/2020   Tachycardia 12/21/2020   DOE (dyspnea on exertion) 12/21/2020   Anemia due to blood loss, acute 07/29/2018    Class: Acute   DVT (deep venous thrombosis) ([place]) 07/28/2018   Unilateral primary osteoarthritis, right knee 07/26/2018    Class: End Stage   S/P TKR (total knee replacement) using cement, right 07/26/2018   Flu-like symptoms 01/23/2018   Dyspnea 01/07/2018   HTN (hypertension) 10/30/2016   Cough 10/30/2016   Asthma with exacerbation 10/30/2016   Edema 11/22/2015   Erectile dysfunction 01/17/2015   Corneal abrasion, right, sequela 06/27/2014   Encounter for well adult exam with abnormal findings 09/23/2011    Impaired glucose tolerance 09/23/2011   Hyperlipidemia 08/14/2010   Extrinsic asthma 09/08/2009   ALLERGIC RHINITIS 12/31/2007   OBESITY 07/18/2007   GERD 07/18/2007   DEGENERATIVE JOINT DISEASE 07/18/2007    Past Surgical History:  Procedure Laterality Date   BACK SURGERY     CARDIOVERSION N/A 12/28/2020   Procedure: CARDIOVERSION;  Surgeon: Skeet Latch, MD;  Location: [facility];  Service: Cardiovascular;  Laterality: N/A;   DG FEMUR LEFT  ([facility]X)     rod placed   TEE WITHOUT CARDIOVERSION N/A 12/28/2020   Procedure: TRANSESOPHAGEAL ECHOCARDIOGRAM (TEE);  Surgeon: Skeet Latch, MD;  Location: [facility];  Service: Cardiovascular;  Laterality: N/A;   TOOTH EXTRACTION     TOTAL KNEE ARTHROPLASTY Right 07/26/2018   Procedure: RIGHT TOTAL KNEE ARTHROPLASTY;  Surgeon: Jessy Oto, MD;  Location: [facility];  Service: Orthopedics;  Laterality: Right;       Home Medications    Prior to Admission medications   Medication Sig Start Date End Date Taking? Authorizing Provider  azithromycin (ZITHROMAX) 250 MG tablet Take 1 tablet (250 mg total) by mouth daily. Take first 2 tablets together, then 1 every day until finished. 12/28/21  Yes Sharion Balloon, NP  predniSONE (DELTASONE) 10 MG tablet Take 4 tablets (40 mg total) by mouth daily for 5 days. 12/28/21 01/02/22 Yes Sharion Balloon, NP  Acetaminophen (TYLENOL PO) Take by mouth daily.    [provider]  albuterol (PROVENTIL HFA;VENTOLIN HFA) 108 (90 Base) MCG/ACT inhaler Inhale 2 puffs into  the lungs every 6 (six) hours as needed for wheezing or shortness of breath. 01/07/18   Biagio Borg, MD  aspirin EC 81 MG tablet Take 81 mg by mouth daily. Swallow whole.    [provider]  baclofen (LIORESAL) 10 MG tablet Take 10 mg by mouth daily as needed for muscle spasms.    [provider]  benzonatate (TESSALON PERLES) 100 MG capsule 1-2 capsules up to twice daily as needed for cough 10/08/21   Lucretia Kern,  DO  budesonide-formoterol (SYMBICORT) 80-4.5 MCG/ACT inhaler Inhale 2 puffs into the lungs 2 (two) times daily. 1-2 puffs Daily    [provider]  diclofenac Sodium (VOLTAREN) 1 % GEL Apply 2 g topically daily as needed (pain).    [provider]  esomeprazole (NEXIUM) 40 MG capsule Take 1 capsule (40 mg total) by mouth daily at 12 noon. Take 1 Daily 07/03/21   Biagio Borg, MD  gabapentin (NEURONTIN) 300 MG capsule Take 1 capsule (300 mg total) by mouth at bedtime. 1 capsule at bedtime 04/02/21   Jessy Oto, MD  metoprolol succinate (TOPROL-XL) 25 MG 24 hr tablet Take 1 tablet (25 mg total) by mouth daily. 02/01/21   Lendon Colonel, NP  Multiple Vitamins-Minerals (CENTRUM ADULTS PO) Take by mouth.    [provider]  NAPROXEN PO Take by mouth at bedtime.    [provider]    Family History History reviewed. No pertinent family history.  Social History Social History   Tobacco Use   Smoking status: Former   Smokeless tobacco: Never   Tobacco comments:    Marijuana 30 years  Vaping Use   Vaping Use: Never used  Substance Use Topics   Alcohol use: Yes    Alcohol/week: 2.0 - 4.0 standard drinks    Types: 2 - 4 Cans of beer per week   Drug use: Never     Allergies   Patient has no known allergies.   Review of Systems Review of Systems  Constitutional:  Positive for fatigue. Negative for chills and fever.  HENT:  Positive for congestion. Negative for ear pain and sore throat.   Respiratory:  Positive for cough and wheezing. Negative for shortness of breath.   Cardiovascular:  Negative for chest pain and palpitations.  Gastrointestinal:  Negative for diarrhea and vomiting.  Skin:  Negative for color change and rash.  All other systems reviewed and are negative.   Physical Exam Triage Vital Signs ED Triage Vitals  Enc Vitals Group     BP 12/28/21 1428 (!) 173/102     Pulse Rate 12/28/21 1428 66     Resp 12/28/21 1428 18     Temp  12/28/21 1428 98.4 F (36.9 C)     Temp src --      SpO2 12/28/21 1428 94 %     Weight --      Height --      Head Circumference --      Peak Flow --      Pain Score 12/28/21 1426 5     Pain Loc --      Pain Edu? --      Excl. in [place]? --    No data found.  Updated Vital Signs BP (!) 168/98    Pulse 66    Temp 98.4 F (36.9 C)    Resp 18    SpO2 94%   Visual Acuity Right Eye Distance:   Left Eye  Distance:   Bilateral Distance:    Right Eye Near:   Left Eye Near:    Bilateral Near:     Physical Exam Vitals and nursing note reviewed.  Constitutional:      General: He is not in acute distress.    Appearance: He is well-developed.  HENT:     Right Ear: Tympanic membrane normal.     Left Ear: Tympanic membrane normal.     Nose: Nose normal.     Mouth/Throat:     Mouth: Mucous membranes are moist.     Pharynx: Oropharynx is clear.  Cardiovascular:     Rate and Rhythm: Normal rate and regular rhythm.     Heart sounds: Normal heart sounds.  Pulmonary:     Effort: Pulmonary effort is normal. No respiratory distress.     Breath sounds: Wheezing present.     Comments: Scattered expiratory wheezes.  Musculoskeletal:     Cervical back: Neck supple.  Skin:    General: Skin is warm and dry.  Neurological:     Mental Status: He is alert.  Psychiatric:        Mood and Affect: Mood normal.        Behavior: Behavior normal.     UC Treatments / Results  Labs (all labs ordered are listed, but only abnormal results are displayed) Labs Reviewed - No data to display  EKG   Radiology No results found.  Procedures Procedures (including critical care time)  Medications Ordered in UC Medications - No data to display  Initial Impression / Assessment and Plan / UC Course  I have reviewed the triage vital signs and the nursing notes.  Pertinent labs & imaging results that were available during my care of the patient were reviewed by me and considered in my medical  decision making (see chart for details).   Acute bronchitis, asthma exacerbation, elevated blood pressure with HTN.  Patient declines COVID, flu, RSV testing today.  Treating with prednisone, Zithromax, and continued use of albuterol inhaler.  Instructed patient to follow-up with his PCP next week for recheck.  Also discussed with patient that his blood pressure is elevated today and needs to be rechecked by PCP in 2 to 4 weeks.  Education provided on managing hypertension.  Advised patient to avoid OTC medications that can elevate his blood pressure.  Patient agrees to plan of care.   Final Clinical Impressions(s) / UC Diagnoses   Final diagnoses:  Acute bronchitis, unspecified organism  Asthma with acute exacerbation, unspecified asthma severity, unspecified whether persistent  Elevated blood pressure reading in office with diagnosis of hypertension     Discharge Instructions      Take the prednisone and Zithromax as directed.  Continue using the albuterol inhaler.  Follow up with your primary care provider next week.   Your blood pressure is elevated today at 173/102; repeat 168/98.  Please have this rechecked by your primary care provider in 2-4 weeks.          ED Prescriptions     Medication Sig Dispense Auth. Provider   predniSONE (DELTASONE) 10 MG tablet Take 4 tablets (40 mg total) by mouth daily for 5 days. 20 tablet Mickie Bail, NP   azithromycin (ZITHROMAX) 250 MG tablet Take 1 tablet (250 mg total) by mouth daily. Take first 2 tablets together, then 1 every day until finished. 6 tablet Mickie Bail, NP      PDMP not reviewed this encounter.   Wendee Beavers  H, NP 12/28/21 1514

## 2021-12-28 NOTE — ED Triage Notes (Signed)
Pt states he is not COVID positive. He presents with body aches, fatigue, cough and congestion x 2 days.

## 2021-12-28 NOTE — Discharge Instructions (Addendum)
Take the prednisone and Zithromax as directed.  Continue using the albuterol inhaler.  Follow up with your primary care provider next week.   Your blood pressure is elevated today at 173/102; repeat 168/98.  Please have this rechecked by your primary care provider in 2-4 weeks.

## 2022-01-03 ENCOUNTER — Other Ambulatory Visit: Payer: Self-pay

## 2022-01-03 ENCOUNTER — Ambulatory Visit (HOSPITAL_COMMUNITY): Payer: BC Managed Care – PPO | Attending: Cardiovascular Disease

## 2022-01-03 DIAGNOSIS — I5042 Chronic combined systolic (congestive) and diastolic (congestive) heart failure: Secondary | ICD-10-CM | POA: Diagnosis not present

## 2022-01-03 LAB — ECHOCARDIOGRAM COMPLETE
Area-P 1/2: 3.15 cm2
S' Lateral: 2.5 cm

## 2022-01-29 ENCOUNTER — Ambulatory Visit: Payer: Self-pay

## 2022-01-29 ENCOUNTER — Encounter: Payer: Self-pay | Admitting: Physical Medicine and Rehabilitation

## 2022-01-29 ENCOUNTER — Ambulatory Visit: Payer: BC Managed Care – PPO | Admitting: Physical Medicine and Rehabilitation

## 2022-01-29 ENCOUNTER — Other Ambulatory Visit: Payer: Self-pay | Admitting: Adult Health

## 2022-01-29 ENCOUNTER — Other Ambulatory Visit: Payer: Self-pay

## 2022-01-29 DIAGNOSIS — M25511 Pain in right shoulder: Secondary | ICD-10-CM | POA: Diagnosis not present

## 2022-01-29 DIAGNOSIS — M25512 Pain in left shoulder: Secondary | ICD-10-CM

## 2022-01-29 DIAGNOSIS — G8929 Other chronic pain: Secondary | ICD-10-CM | POA: Diagnosis not present

## 2022-01-29 NOTE — Progress Notes (Signed)
Pt state he has pain in both shoulder, mostly his right. Pt state any movement makes the pain worse. Pt state he takes over the counter pain meds to help ease his pain. ? ?Numeric Pain Rating Scale and Functional Assessment ?Average Pain 7 ? ? ?In the last MONTH (on 0-10 scale) has pain interfered with the following? ? ?1. General activity like being  able to carry out your everyday physical activities such as walking, climbing stairs, carrying groceries, or moving a chair?  ?Rating(10) ? ? ?-BT, -Dye Allergies. ? ?

## 2022-02-09 MED ORDER — BUPIVACAINE HCL 0.25 % IJ SOLN
5.0000 mL | INTRAMUSCULAR | Status: AC | PRN
  Administered 2022-01-29: 5 mL via INTRA_ARTICULAR

## 2022-02-09 MED ORDER — TRIAMCINOLONE ACETONIDE 40 MG/ML IJ SUSP
40.0000 mg | INTRAMUSCULAR | Status: AC | PRN
  Administered 2022-01-29: 40 mg via INTRA_ARTICULAR

## 2022-02-09 NOTE — Progress Notes (Signed)
? ?  [name] - 71 y.o. male MRN [id number]  Date of birth: March 21, 1951 ? ?Office Visit Note: ?Visit Date: 01/29/2022 ?PCP: Corwin Levins, MD ?Referred by: Corwin Levins, MD ? ?Subjective: ?Chief Complaint  ?Patient presents with  ? Right Shoulder - Pain  ? Left Shoulder - Pain  ? ?HPI:  [name] is a 71 y.o. male who comes in today at the request of Dr. Vira Browns and Dr. Marrianne Mood Dean for planned Right anesthetic glenohumeral arthrogram with fluoroscopic guidance.  The patient has failed conservative care including home exercise, medications, time and activity modification.  This injection will be diagnostic and hopefully therapeutic.  Please see requesting physician notes for further details and justification.  ? ?ROS Otherwise per HPI. ? ?Assessment & Plan: ?Visit Diagnoses:  ?  ICD-10-CM   ?1. Chronic pain of both shoulders  M25.511 XR C-ARM NO REPORT  ? G89.29   ? M25.512   ?  ?  ?Plan: No additional findings.  ? ?Meds & Orders: No orders of the defined types were placed in this encounter. ?  ?Orders Placed This Encounter  ?Procedures  ? Large Joint Inj  ? XR C-ARM NO REPORT  ?  ?Follow-up: Return for visit to requesting provider as needed.  ? ?Procedures: ?Large Joint Inj: R glenohumeral on 01/29/2022 3:15 PM ?Indications: pain and diagnostic evaluation ?Details: 22 G 3.5 in needle, fluoroscopy-guided anteromedial approach ? ?Arthrogram: No ? ?Medications: 40 mg triamcinolone acetonide 40 MG/ML; 5 mL bupivacaine 0.25 % ?Outcome: tolerated well, no immediate complications ? ?There was excellent flow of contrast producing a partial arthrogram of the glenohumeral joint. The patient did have relief of symptoms during the anesthetic phase of the injection. ?Procedure, treatment alternatives, risks and benefits explained, specific risks discussed. Consent was given by the patient. Immediately prior to procedure a time out was called to verify the correct patient, procedure, equipment, support staff and  site/side marked as required. Patient was prepped and draped in the usual sterile fashion.  ? ?  ?   ? ?Clinical History: ?No specialty comments available.  ? ? ? ?Objective:  VS:  HT:    WT:   BMI:     BP:   HR: bpm  TEMP: ( )  RESP:  ?Physical Exam  ? ?Imaging: ?No results found. ?

## 2022-02-21 ENCOUNTER — Other Ambulatory Visit: Payer: Self-pay

## 2022-02-21 ENCOUNTER — Ambulatory Visit (HOSPITAL_BASED_OUTPATIENT_CLINIC_OR_DEPARTMENT_OTHER): Payer: BC Managed Care – PPO | Admitting: Family

## 2022-02-21 VITALS — BP 130/90 | HR 63 | Ht 73.0 in | Wt 244.9 lb

## 2022-02-21 DIAGNOSIS — I10 Essential (primary) hypertension: Secondary | ICD-10-CM

## 2022-02-21 DIAGNOSIS — I4892 Unspecified atrial flutter: Secondary | ICD-10-CM | POA: Diagnosis not present

## 2022-02-21 DIAGNOSIS — I5042 Chronic combined systolic (congestive) and diastolic (congestive) heart failure: Secondary | ICD-10-CM

## 2022-02-21 NOTE — Progress Notes (Signed)
? ?Office Visit  ?  ?Patient Name: [name] ?Date of Encounter: 02/21/2022 ? ?PCP:  Biagio Borg, MD ?  ?[place]  ?Cardiologist:  Skeet Latch, MD  ?Advanced Practice Provider:  No care team member to display ?Electrophysiologist:  None  ?   ? ?Chief Complaint  ?  ?[name] is a 71 y.o. male with a hx of paroxysmal atrial fibrillation, chronic systolic and diastolic heart failure with recovered LVEF, hyperlipidemia, hypertension, obesity, GERD presents today for follow up after echocardiogram  ? ?Past Medical History  ?  ?Past Medical History:  ?Diagnosis Date  ? ALLERGIC RHINITIS 12/31/2007  ? Chronic combined systolic and diastolic heart failure ([place]) 06/14/2021  ? COUGH, CHRONIC 07/18/2007  ? DEGENERATIVE JOINT DISEASE 07/18/2007  ? Extrinsic asthma, unspecified 09/08/2009  ? GERD 07/18/2007  ? HYPERLIPIDEMIA 08/14/2010  ? Hypertension   ? Impaired glucose tolerance 09/23/2011  ? OBESITY 07/18/2007  ? ?Past Surgical History:  ?Procedure Laterality Date  ? BACK SURGERY    ? CARDIOVERSION N/A 12/28/2020  ? Procedure: CARDIOVERSION;  Surgeon: Skeet Latch, MD;  Location: [place];  Service: Cardiovascular;  Laterality: N/A;  ? DG FEMUR LEFT  ([facility])    ? rod placed  ? TEE WITHOUT CARDIOVERSION N/A 12/28/2020  ? Procedure: TRANSESOPHAGEAL ECHOCARDIOGRAM (TEE);  Surgeon: Skeet Latch, MD;  Location: [facility];  Service: Cardiovascular;  Laterality: N/A;  ? TOOTH EXTRACTION    ? TOTAL KNEE ARTHROPLASTY Right 07/26/2018  ? Procedure: RIGHT TOTAL KNEE ARTHROPLASTY;  Surgeon: Jessy Oto, MD;  Location: [place];  Service: Orthopedics;  Laterality: Right;  ? ? ?Allergies ? ?No Known Allergies ? ?History of Present Illness  ?  ?[name] is a 71 y.o. male with a hx of paroxysmal atrial fibrillation, chronic systolic and diastolic heart failure with recovered LVEF, hyperlipidemia, hypertension, obesity, minimal nonobstructive coronary disease. GERD last seen  06/14/21. ? ?Very pleasant gentleman who presents today for follow-up.  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  He follows a heart healthy diet and is appreciative of his wife doing most of the cooking.  No formal exercise routine. Reports no shortness of breath at rest and stable mild dyspnea on exertion.  We reviewed his most recent echocardiogram 01/03/2022 with normal LVEF 55 to 60%, mild LVH, grade 1 diastolic dysfunction, moderate dilation of the aortic root 46 mm and moderate dilation of the ascending aorta 44 mm which is overall unchanged compared to 2022.  Reports no chest pain, pressure, or tightness. No edema, orthopnea, PND. Reports no palpitations.  .  ? ? ? ?EKGs/Labs/Other Studies Reviewed:  ? ?The following studies were reviewed today: ? ? ?EKG:  EKG is ordered today.  The ekg ordered today demonstrates NSR 63 bpm LAD with first degree AV block with occasional PVC. No acute St/T wave changes.  ? ?Recent Labs: ?06/25/2021: BUN 21; Creatinine, Ser 1.25; Potassium 4.8; Sodium 138  ?Recent Lipid Panel ?   ?Component Value Date/Time  ? CHOL 182 06/15/2020 1602  ? TRIG 125 06/15/2020 1602  ? HDL 49 06/15/2020 1602  ? CHOLHDL 3.7 06/15/2020 1602  ? VLDL 22.6 02/10/2019 1640  ? LDLCALC 109 (H) 06/15/2020 1602  ? LDLDIRECT 132.9 09/23/2011 1557  ? ? ?Home Medications  ? ?Current Meds  ?Medication Sig  ? Acetaminophen (TYLENOL PO) Take by mouth daily.  ? albuterol (PROVENTIL HFA;VENTOLIN HFA) 108 (90 Base) MCG/ACT inhaler  Inhale 2 puffs into the lungs every 6 (six) hours as needed for wheezing or shortness of breath.  ? aspirin EC 81 MG tablet Take 81 mg by mouth daily. Swallow whole.  ? baclofen (LIORESAL) 10 MG tablet Take 10 mg by mouth daily as needed for muscle spasms.  ? budesonide-formoterol (SYMBICORT) 80-4.5 MCG/ACT inhaler Inhale 2 puffs into the lungs 2 (two) times daily. 1-2 puffs Daily  ? diclofenac Sodium (VOLTAREN) 1 % GEL Apply 2 g topically daily as needed  (pain).  ? esomeprazole (NEXIUM) 40 MG capsule Take 1 capsule (40 mg total) by mouth daily at 12 noon. Take 1 Daily  ? gabapentin (NEURONTIN) 300 MG capsule Take 1 capsule (300 mg total) by mouth at bedtime. 1 capsule at bedtime  ? metoprolol succinate (TOPROL-XL) 25 MG 24 hr tablet Take 1 tablet (25 mg total) by mouth daily.  ? Multiple Vitamins-Minerals (CENTRUM ADULTS PO) Take by mouth.  ? NAPROXEN PO Take by mouth at bedtime.  ?  ? ?Review of Systems  ?    ?All other systems reviewed and are otherwise negative except as noted above. ? ?Physical Exam  ?  ?VS:  BP 130/90 (BP Location: Left Arm, Patient Position: Sitting, Cuff Size: Normal)   Pulse 63   Ht 6\' 1"  (1.854 m)   Wt 244 lb 14.4 oz (111.1 kg)   BMI 32.31 kg/m?  , BMI Body mass index is 32.31 kg/m?. ? ?Wt Readings from Last 3 Encounters:  ?02/21/22 244 lb 14.4 oz (111.1 kg)  ?10/28/21 229 lb (103.9 kg)  ?06/14/21 238 lb 12.8 oz (108.3 kg)  ?  ? ?GEN: Well nourished, well developed, in no acute distress. ?HEENT: normal. ?Neck: Supple, no JVD, carotid bruits, or masses. ?Cardiac: RRR, no murmurs, rubs, or gallops. No clubbing, cyanosis, edema.  Radials/PT 2+ and equal bilaterally.  ?Respiratory:  Respirations regular and unlabored, clear to auscultation bilaterally. ?GI: Soft, nontender, nondistended. ?MS: No deformity or atrophy. ?Skin: Warm and dry, no rash. ?Neuro:  Strength and sensation are intact. ?Psych: Normal affect. ? ?Assessment & Plan  ?  ?Aorta and aortic root dilation -01/2019 3 aortic root 46 mm and ascending aorta 44 mm.  This is unchanged compared to 2022.  Consider repeat imaging at follow-up.  Continue optimal blood pressure control, metoprolol. ? 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  Recent echo 01/2022 with normal LVEF, grade 1 diastolic dysfunction.  Coronary CTA 05/2021 with minimal proximal noncalcified stenosis of the LAD as well as aortic atherosclerosis.  Borderline dilated main pulmonary artery at 29  mm suggestive of possible pulmonary hypertension.  He was considered overall low risk study.  Pulmonary nodules were stable. ? ?CAD - Minimal nonobstructive disease by cardiac CTA. Continue aspirin, metoprolol. Last lipid panel 2021. Will request he update lipid panel to determine statin dosing.  ? ?Atrial flutter /chronic combined systolic and diastolic heart failure -maintaining sinus rhythm.  No longer on anticoagulation due to CHA2DS2-VASc score of 1 for age.  LVEF was less than 20% at time of atrial fibrillation/flutter but has subsequently normalized by echocardiograms.  He remains euvolemic.  Continue metoprolol. ? ? ?Disposition: Follow up in 6 month(s) with Skeet Latch, MD or APP. ? ?Signed, ?Loel Dubonnet, NP ?02/21/2022, 9:11 AM ?[facility] ?

## 2022-02-21 NOTE — Patient Instructions (Addendum)
Medication Instructions:  ?Continue your current medications.  ? ?*If you need a refill on your cardiac medications before your next appointment, please call your pharmacy* ? ?Lab Work: ?None ordered today. ? ?Testing/Procedures: ?Your EKG today showed normal sinus rhythm which is a good result.  ? ?Follow-Up: ?At [facility], you and your health needs are our priority.  As part of our continuing mission to provide you with exceptional heart care, we have created designated Provider Care Teams.  These Care Teams include your primary Cardiologist (physician) and Advanced Practice Providers (APPs -  Physician Assistants and Nurse Practitioners) who all work together to provide you with the care you need, when you need it. ? ?We recommend signing up for the patient portal called "MyChart".  Sign up information is provided on this After Visit Summary.  MyChart is used to connect with patients for Virtual Visits (Telemedicine).  Patients are able to view lab/test results, encounter notes, upcoming appointments, etc.  Non-urgent messages can be sent to your provider as well.   ?To learn more about what you can do with MyChart, go to [email or web address].   ? ?Your next appointment:   ?6 month(s) ? ?The format for your next appointment:   ?In Person ? ?Provider:   ?Chilton Si, MD  ? ?Other Instructions ? ?Heart Healthy Diet Recommendations: ?A low-salt diet is recommended. Meats should be grilled, baked, or boiled. Avoid fried foods. Focus on lean protein sources like fish or chicken with vegetables and fruits. The American Heart Association is a Chief Technology Officer!  American Heart Association Diet and Lifeystyle Recommendations   ? ?Exercise recommendations: ?The American Heart Association recommends 150 minutes of moderate intensity exercise weekly. ?Try 30 minutes of moderate intensity exercise 4-5 times per week. ?This could include walking, jogging, or swimming. ? ?

## 2022-02-22 ENCOUNTER — Encounter (HOSPITAL_BASED_OUTPATIENT_CLINIC_OR_DEPARTMENT_OTHER): Payer: Self-pay | Admitting: Family

## 2022-02-24 ENCOUNTER — Telehealth (HOSPITAL_BASED_OUTPATIENT_CLINIC_OR_DEPARTMENT_OTHER): Payer: Self-pay

## 2022-02-24 DIAGNOSIS — E785 Hyperlipidemia, unspecified: Secondary | ICD-10-CM

## 2022-02-24 NOTE — Telephone Encounter (Addendum)
Labs ordered and slips mailed to patient, also sent mychart message  ? ?----- Message from Alver Sorrow, NP sent at 02/22/2022  5:57 PM EDT ----- ?Noted after visit that he is due for lipid panel, CMP given small amount of plaque on cardiac CTA. Please mail lab slips ?

## 2022-03-07 DIAGNOSIS — E785 Hyperlipidemia, unspecified: Secondary | ICD-10-CM | POA: Diagnosis not present

## 2022-03-08 LAB — COMPREHENSIVE METABOLIC PANEL
ALT: 16 IU/L (ref 0–44)
AST: 19 IU/L (ref 0–40)
Albumin/Globulin Ratio: 2.3 — ABNORMAL HIGH (ref 1.2–2.2)
Albumin: 4.2 g/dL (ref 3.8–4.8)
Alkaline Phosphatase: 92 IU/L (ref 44–121)
BUN/Creatinine Ratio: 13 (ref 10–24)
BUN: 16 mg/dL (ref 8–27)
Bilirubin Total: 0.4 mg/dL (ref 0.0–1.2)
CO2: 24 mmol/L (ref 20–29)
Calcium: 9 mg/dL (ref 8.6–10.2)
Chloride: 108 mmol/L — ABNORMAL HIGH (ref 96–106)
Creatinine, Ser: 1.2 mg/dL (ref 0.76–1.27)
Globulin, Total: 1.8 g/dL (ref 1.5–4.5)
Glucose: 88 mg/dL (ref 70–99)
Potassium: 4.9 mmol/L (ref 3.5–5.2)
Sodium: 142 mmol/L (ref 134–144)
Total Protein: 6 g/dL (ref 6.0–8.5)
eGFR: 65 mL/min/{1.73_m2} (ref >59)

## 2022-03-08 LAB — LIPID PANEL
Chol/HDL Ratio: 4 ratio (ref 0.0–5.0)
Cholesterol, Total: 182 mg/dL (ref 100–199)
HDL: 46 mg/dL (ref >39)
LDL Chol Calc (NIH): 118 mg/dL — ABNORMAL HIGH (ref 0–99)
Triglycerides: 100 mg/dL (ref 0–149)
VLDL Cholesterol Cal: 18 mg/dL (ref 5–40)

## 2022-03-10 ENCOUNTER — Other Ambulatory Visit (HOSPITAL_BASED_OUTPATIENT_CLINIC_OR_DEPARTMENT_OTHER): Payer: Self-pay

## 2022-03-10 DIAGNOSIS — Z79899 Other long term (current) drug therapy: Secondary | ICD-10-CM

## 2022-03-10 MED ORDER — ROSUVASTATIN CALCIUM 20 MG PO TABS: 20.0000 mg | ORAL_TABLET | Freq: Every day | ORAL | 3 refills | Status: DC

## 2022-03-11 ENCOUNTER — Other Ambulatory Visit: Payer: Self-pay | Admitting: Specialist

## 2022-03-11 ENCOUNTER — Other Ambulatory Visit: Payer: Self-pay | Admitting: Internal Medicine

## 2022-03-11 NOTE — Telephone Encounter (Signed)
Please refill as per office routine med refill policy (all routine meds to be refilled for 3 mo or monthly (per pt preference) up to one year from last visit, then month to month grace period for 3 mo, then further med refills will have to be denied) ? ?

## 2022-04-01 ENCOUNTER — Encounter: Payer: Self-pay | Admitting: Internal Medicine

## 2022-04-02 ENCOUNTER — Telehealth: Payer: Self-pay | Admitting: Internal Medicine

## 2022-04-02 NOTE — Telephone Encounter (Signed)
I have sent pt a mychart message on need for appt since he was last seen jan 2022 ? ?Could we call to offer an appt today or tomorrow?   thanks ?

## 2022-04-02 NOTE — Telephone Encounter (Signed)
Patient unable to come today due to work. Scheduled for 04/03/22 at 4 ?

## 2022-04-03 ENCOUNTER — Ambulatory Visit (INDEPENDENT_AMBULATORY_CARE_PROVIDER_SITE_OTHER): Payer: BC Managed Care – PPO | Admitting: Internal Medicine

## 2022-04-03 ENCOUNTER — Other Ambulatory Visit (INDEPENDENT_AMBULATORY_CARE_PROVIDER_SITE_OTHER): Payer: BC Managed Care – PPO

## 2022-04-03 ENCOUNTER — Encounter: Payer: Self-pay | Admitting: Internal Medicine

## 2022-04-03 VITALS — BP 122/68 | HR 62 | Temp 98.2°F | Ht 73.0 in | Wt 242.8 lb

## 2022-04-03 DIAGNOSIS — E559 Vitamin D deficiency, unspecified: Secondary | ICD-10-CM | POA: Diagnosis not present

## 2022-04-03 DIAGNOSIS — Z0000 Encounter for general adult medical examination without abnormal findings: Secondary | ICD-10-CM | POA: Diagnosis not present

## 2022-04-03 DIAGNOSIS — I10 Essential (primary) hypertension: Secondary | ICD-10-CM

## 2022-04-03 DIAGNOSIS — Z125 Encounter for screening for malignant neoplasm of prostate: Secondary | ICD-10-CM | POA: Diagnosis not present

## 2022-04-03 DIAGNOSIS — E538 Deficiency of other specified B group vitamins: Secondary | ICD-10-CM

## 2022-04-03 DIAGNOSIS — R7302 Impaired glucose tolerance (oral): Secondary | ICD-10-CM

## 2022-04-03 DIAGNOSIS — Z0001 Encounter for general adult medical examination with abnormal findings: Secondary | ICD-10-CM

## 2022-04-03 DIAGNOSIS — J209 Acute bronchitis, unspecified: Secondary | ICD-10-CM

## 2022-04-03 DIAGNOSIS — E7849 Other hyperlipidemia: Secondary | ICD-10-CM | POA: Diagnosis not present

## 2022-04-03 LAB — CBC WITH DIFFERENTIAL/PLATELET
Basophils Absolute: 0.1 10*3/uL (ref 0.0–0.1)
Basophils Relative: 1 % (ref 0.0–3.0)
Eosinophils Absolute: 0.3 10*3/uL (ref 0.0–0.7)
Eosinophils Relative: 5.2 % — ABNORMAL HIGH (ref 0.0–5.0)
HCT: 42.8 % (ref 39.0–52.0)
Hemoglobin: 14 g/dL (ref 13.0–17.0)
Lymphocytes Relative: 30 % (ref 12.0–46.0)
Lymphs Abs: 1.9 10*3/uL (ref 0.7–4.0)
MCHC: 32.7 g/dL (ref 30.0–36.0)
MCV: 92.9 fl (ref 78.0–100.0)
Monocytes Absolute: 0.8 10*3/uL (ref 0.1–1.0)
Monocytes Relative: 12.9 % — ABNORMAL HIGH (ref 3.0–12.0)
Neutro Abs: 3.2 10*3/uL (ref 1.4–7.7)
Neutrophils Relative %: 50.9 % (ref 43.0–77.0)
Platelets: 197 10*3/uL (ref 150.0–400.0)
RBC: 4.6 Mil/uL (ref 4.22–5.81)
RDW: 13.5 % (ref 11.5–15.5)
WBC: 6.2 10*3/uL (ref 4.0–10.5)

## 2022-04-03 MED ORDER — BUDESONIDE-FORMOTEROL FUMARATE 80-4.5 MCG/ACT IN AERO: 2.0000 | INHALATION_SPRAY | Freq: Two times a day (BID) | RESPIRATORY_TRACT | 3 refills | Status: DC

## 2022-04-03 MED ORDER — ALBUTEROL SULFATE HFA 108 (90 BASE) MCG/ACT IN AERS: 2.0000 | INHALATION_SPRAY | Freq: Four times a day (QID) | RESPIRATORY_TRACT | 3 refills | Status: DC | PRN

## 2022-04-03 MED ORDER — ESOMEPRAZOLE MAGNESIUM 40 MG PO CPDR: 40.0000 mg | DELAYED_RELEASE_CAPSULE | Freq: Every day | ORAL | 3 refills | Status: AC

## 2022-04-03 MED ORDER — METOPROLOL SUCCINATE ER 25 MG PO TB24: 25.0000 mg | ORAL_TABLET | Freq: Every day | ORAL | 3 refills | Status: DC

## 2022-04-03 NOTE — Patient Instructions (Signed)

## 2022-04-03 NOTE — Progress Notes (Signed)
Patient ID: Joseph FarberRobert M [name], male   DOB: 05/14/1951, 71 y.o.   MRN: [id number] ? ? ? ?     Chief Complaint:: wellness exam and hld, hyperglycemia, htn ? ?     HPI:  [name] is a 71 y.o. male here for wellness exam; declines shingrix, o/w up to date ?         ?              Also just started crestor 20 q for hld, tolerating ok so far.  Pt denies chest pain, increased sob or doe, wheezing, orthopnea, PND, increased LE swelling, palpitations, dizziness or syncope.   Pt denies polydipsia, polyuria, or new focal neuro s/s.    Pt denies fever, wt loss, night sweats, loss of appetite, or other constitutional symptoms  ?  ?Wt Readings from Last 3 Encounters:  ?04/03/22 242 lb 12.8 oz (110.1 kg)  ?02/21/22 244 lb 14.4 oz (111.1 kg)  ?10/28/21 229 lb (103.9 kg)  ? ?BP Readings from Last 3 Encounters:  ?04/03/22 122/68  ?02/21/22 130/90  ?12/28/21 (!) 168/98  ? ?Immunization History  ?Administered Date(s) Administered  ? Influenza, High Dose Seasonal PF 08/10/2018  ? Influenza,inj,Quad PF,6+ Mos 01/17/2015  ? Influenza-Unspecified 12/01/2017  ? PFIZER(Purple Top)SARS-COV-2 Vaccination 03/01/2020, 03/26/2020  ? Pneumococcal Conjugate-13 06/15/2020  ? Pneumococcal Polysaccharide-23 07/28/2018  ? Tdap 01/17/2015  ? ?There are no preventive care reminders to display for this patient. ? ?  ? ?Past Medical History:  ?Diagnosis Date  ? ALLERGIC RHINITIS 12/31/2007  ? Chronic combined systolic and diastolic heart failure ([facility]) 06/14/2021  ? COUGH, CHRONIC 07/18/2007  ? DEGENERATIVE JOINT DISEASE 07/18/2007  ? Extrinsic asthma, unspecified 09/08/2009  ? GERD 07/18/2007  ? HYPERLIPIDEMIA 08/14/2010  ? Hypertension   ? Impaired glucose tolerance 09/23/2011  ? OBESITY 07/18/2007  ? ?Past Surgical History:  ?Procedure Laterality Date  ? BACK SURGERY    ? CARDIOVERSION N/A 12/28/2020  ? Procedure: CARDIOVERSION;  Surgeon: Chilton Siandolph, Tiffany, MD;  [facility];  Service: Cardiovascular;  Laterality: N/A;  ? DG FEMUR LEFT  ([facility] HX)     ? rod placed  ? TEE WITHOUT CARDIOVERSION N/A 12/28/2020  ? Procedure: TRANSESOPHAGEAL ECHOCARDIOGRAM (TEE);  Surgeon: Chilton Siandolph, Tiffany, MD;  [facility];  Service: Cardiovascular;  Laterality: N/A;  ? TOOTH EXTRACTION    ? TOTAL KNEE ARTHROPLASTY Right 07/26/2018  ? Procedure: RIGHT TOTAL KNEE ARTHROPLASTY;  Surgeon: Kerrin ChampagneNitka, Saori Umholtz E, MD;  [facility];  Service: Orthopedics;  Laterality: Right;  ? ? reports that he has quit smoking. He has never used smokeless tobacco. He reports current alcohol use of about 2.0 - 4.0 standard drinks per week. He reports that he does not use drugs. ?family history is not on file. ?No Known Allergies ?Current Outpatient Medications on File Prior to Visit  ?Medication Sig Dispense Refill  ? Acetaminophen (TYLENOL PO) Take by mouth daily.    ? aspirin EC 81 MG tablet Take 81 mg by mouth daily. Swallow whole.    ? baclofen (LIORESAL) 10 MG tablet Take 10 mg by mouth daily as needed for muscle spasms.    ? diclofenac Sodium (VOLTAREN) 1 % GEL Apply 2 g topically daily as needed (pain).    ? gabapentin (NEURONTIN) 300 MG capsule Take 1 capsule (300 mg total) by mouth at bedtime. 1 capsule at bedtime 180 capsule 3  ? Multiple Vitamins-Minerals (CENTRUM ADULTS PO) Take by mouth.    ? naproxen (NAPROSYN) 500 MG tablet  TAKE 1 TABLET TWICE DAILY  WITH MEALS 180 tablet 3  ? NAPROXEN PO Take by mouth at bedtime.    ? rosuvastatin (CRESTOR) 20 MG tablet Take 1 tablet (20 mg total) by mouth daily. 90 tablet 3  ? ?No current facility-administered medications on file prior to visit.  ? ?     ROS:  All others reviewed and negative. ? ?Objective  ? ?     PE:  BP 122/68 (BP Location: Left Arm, Patient Position: Sitting, Cuff Size: Large)   Pulse 62   Temp 98.2 ?F (36.8 ?C) (Oral)   Ht 6\' 1"  (1.854 m)   Wt 242 lb 12.8 oz (110.1 kg)   SpO2 95%   BMI 32.03 kg/m?  ? ?              Constitutional: Pt appears in NAD ?              HENT: Head: NCAT.  ?              Right Ear: External ear  normal.   ?              Left Ear: External ear normal.  ?              Eyes: . Pupils are equal, round, and reactive to light. Conjunctivae and EOM are normal ?              Nose: without d/c or deformity ?              Neck: Neck supple. Gross normal ROM ?              Cardiovascular: Normal rate and regular rhythm.   ?              Pulmonary/Chest: Effort normal and breath sounds without rales or wheezing.  ?              Abd:  Soft, NT, ND, + BS, no organomegaly ?              Neurological: Pt is alert. At baseline orientation, motor grossly intact ?              Skin: Skin is warm. No rashes, no other new lesions, LE edema - none ?              Psychiatric: Pt behavior is normal without agitation  ? ?Micro: none ? ?Cardiac tracings I have personally interpreted today:  none ? ?Pertinent Radiological findings (summarize): none  ? ?Lab Results  ?Component Value Date  ? WBC 6.2 04/03/2022  ? HGB 14.0 04/03/2022  ? HCT 42.8 04/03/2022  ? PLT 197.0 04/03/2022  ? GLUCOSE 88 03/07/2022  ? CHOL 182 03/07/2022  ? TRIG 100 03/07/2022  ? HDL 46 03/07/2022  ? LDLDIRECT 132.9 09/23/2011  ? LDLCALC 118 (H) 03/07/2022  ? ALT 16 03/07/2022  ? AST 19 03/07/2022  ? NA 142 03/07/2022  ? K 4.9 03/07/2022  ? CL 108 (H) 03/07/2022  ? CREATININE 1.20 03/07/2022  ? BUN 16 03/07/2022  ? CO2 24 03/07/2022  ? TSH 1.36 04/03/2022  ? PSA 0.90 04/03/2022  ? INR 0.96 07/16/2018  ? HGBA1C 5.8 04/03/2022  ? ?Assessment/Plan:  ?[name] is a 71 y.o. White or Caucasian [1] male with  has a past medical history of ALLERGIC RHINITIS (12/31/2007), Chronic combined systolic and diastolic heart failure ([facility]) (01/02/2008), COUGH, CHRONIC (07/18/2007), DEGENERATIVE JOINT DISEASE (  07/18/2007), Extrinsic asthma, unspecified (09/08/2009), GERD (07/18/2007), HYPERLIPIDEMIA (08/14/2010), Hypertension, Impaired glucose tolerance (09/23/2011), and OBESITY (07/18/2007). ? ?Preventative health care ?Age and sex appropriate education and counseling updated with  regular exercise and diet ?Referrals for preventative services - none needed ?Immunizations addressed - declines shignrix ?Smoking counseling  - none needed ?Evidence for depression or other mood disorder - none significant ?Most recent labs reviewed. ?I have personally reviewed and have noted: ?1) the patient's medical and social history ?2) The patient's current medications and supplements ?3) The patient's height, weight, and BMI have been recorded in the chart ? ? ?Hyperlipidemia ?To continue crestor, low chol diet ? ?Impaired glucose tolerance ?Marland Kitchen ?Lab Results  ?Component Value Date  ? HGBA1C 5.8 04/03/2022  ? ?Stable, pt to continue current medical treatment  -diet ? ? ?HTN (hypertension) ?BP Readings from Last 3 Encounters:  ?04/03/22 122/68  ?02/21/22 130/90  ?12/28/21 (!) 168/98  ? ?Stable, pt to continue medical treatment toprol xl 25 ? ?Followup: Return in about 1 year (around 04/04/2023). ? ?Oliver Barre, MD 04/05/2022 8:45 PM ?[facility] ?[facility] ?Internal Medicine ?

## 2022-04-04 ENCOUNTER — Other Ambulatory Visit: Payer: Self-pay | Admitting: Internal Medicine

## 2022-04-04 LAB — URINALYSIS, ROUTINE W REFLEX MICROSCOPIC
Bilirubin Urine: NEGATIVE
Hgb urine dipstick: NEGATIVE
Ketones, ur: NEGATIVE
Leukocytes,Ua: NEGATIVE
Nitrite: NEGATIVE
RBC / HPF: NONE SEEN (ref 0–?)
Specific Gravity, Urine: 1.02 (ref 1.000–1.030)
Total Protein, Urine: NEGATIVE
Urine Glucose: NEGATIVE
Urobilinogen, UA: 0.2 (ref 0.0–1.0)
pH: 6.5 (ref 5.0–8.0)

## 2022-04-04 LAB — VITAMIN D 25 HYDROXY (VIT D DEFICIENCY, FRACTURES): VITD: 41.26 ng/mL (ref 30.00–100.00)

## 2022-04-04 LAB — VITAMIN B12: Vitamin B-12: 428 pg/mL (ref 211–911)

## 2022-04-04 LAB — HEMOGLOBIN A1C: Hgb A1c MFr Bld: 5.8 % (ref 4.6–6.5)

## 2022-04-04 LAB — PSA: PSA: 0.9 ng/mL (ref 0.10–4.00)

## 2022-04-04 LAB — TSH: TSH: 1.36 u[IU]/mL (ref 0.35–5.50)

## 2022-04-05 ENCOUNTER — Encounter: Payer: Self-pay | Admitting: Internal Medicine

## 2022-04-05 NOTE — Assessment & Plan Note (Signed)
Age and sex appropriate education and counseling updated with regular exercise and diet ?Referrals for preventative services - none needed ?Immunizations addressed - declines shignrix ?Smoking counseling  - none needed ?Evidence for depression or other mood disorder - none significant ?Most recent labs reviewed. ?I have personally reviewed and have noted: ?1) the patient's medical and social history ?2) The patient's current medications and supplements ?3) The patient's height, weight, and BMI have been recorded in the chart ? ?

## 2022-04-05 NOTE — Assessment & Plan Note (Signed)
. ?  Lab Results  ?Component Value Date  ? HGBA1C 5.8 04/03/2022  ? ?Stable, pt to continue current medical treatment  -diet ? ?

## 2022-04-05 NOTE — Assessment & Plan Note (Signed)
BP Readings from Last 3 Encounters:  ?04/03/22 122/68  ?02/21/22 130/90  ?12/28/21 (!) 168/98  ? ?Stable, pt to continue medical treatment toprol xl 25 ? ?

## 2022-04-05 NOTE — Assessment & Plan Note (Signed)
To continue crestor, low chol diet ?

## 2022-04-06 ENCOUNTER — Other Ambulatory Visit: Payer: Self-pay

## 2022-04-06 ENCOUNTER — Encounter (HOSPITAL_BASED_OUTPATIENT_CLINIC_OR_DEPARTMENT_OTHER): Payer: Self-pay | Admitting: Emergency Medicine

## 2022-04-06 ENCOUNTER — Emergency Department (HOSPITAL_BASED_OUTPATIENT_CLINIC_OR_DEPARTMENT_OTHER)
Admission: EM | Admit: 2022-04-06 | Discharge: 2022-04-07 | Disposition: A | Discharge status: Home / Self Care | Payer: BC Managed Care – PPO | Attending: Emergency Medicine | Admitting: Emergency Medicine

## 2022-04-06 ENCOUNTER — Emergency Department (HOSPITAL_BASED_OUTPATIENT_CLINIC_OR_DEPARTMENT_OTHER): Payer: BC Managed Care – PPO

## 2022-04-06 DIAGNOSIS — R112 Nausea with vomiting, unspecified: Secondary | ICD-10-CM | POA: Diagnosis not present

## 2022-04-06 DIAGNOSIS — K469 Unspecified abdominal hernia without obstruction or gangrene: Secondary | ICD-10-CM | POA: Insufficient documentation

## 2022-04-06 DIAGNOSIS — K5730 Diverticulosis of large intestine without perforation or abscess without bleeding: Secondary | ICD-10-CM | POA: Diagnosis not present

## 2022-04-06 DIAGNOSIS — R1111 Vomiting without nausea: Secondary | ICD-10-CM | POA: Diagnosis not present

## 2022-04-06 DIAGNOSIS — R1032 Left lower quadrant pain: Secondary | ICD-10-CM | POA: Insufficient documentation

## 2022-04-06 DIAGNOSIS — R109 Unspecified abdominal pain: Secondary | ICD-10-CM | POA: Diagnosis not present

## 2022-04-06 DIAGNOSIS — Z7982 Long term (current) use of aspirin: Secondary | ICD-10-CM | POA: Diagnosis not present

## 2022-04-06 DIAGNOSIS — K5939 Other megacolon: Secondary | ICD-10-CM | POA: Diagnosis not present

## 2022-04-06 DIAGNOSIS — K6389 Other specified diseases of intestine: Secondary | ICD-10-CM | POA: Diagnosis not present

## 2022-04-06 LAB — CBC WITH DIFFERENTIAL/PLATELET
Abs Immature Granulocytes: 0.03 10*3/uL (ref 0.00–0.07)
Basophils Absolute: 0.1 10*3/uL (ref 0.0–0.1)
Basophils Relative: 1 %
Eosinophils Absolute: 0.2 10*3/uL (ref 0.0–0.5)
Eosinophils Relative: 3 %
HCT: 40.8 % (ref 39.0–52.0)
Hemoglobin: 13.5 g/dL (ref 13.0–17.0)
Immature Granulocytes: 0 %
Lymphocytes Relative: 14 %
Lymphs Abs: 1 10*3/uL (ref 0.7–4.0)
MCH: 30.9 pg (ref 26.0–34.0)
MCHC: 33.1 g/dL (ref 30.0–36.0)
MCV: 93.4 fL (ref 80.0–100.0)
Monocytes Absolute: 0.7 10*3/uL (ref 0.1–1.0)
Monocytes Relative: 9 %
Neutro Abs: 5.2 10*3/uL (ref 1.7–7.7)
Neutrophils Relative %: 73 %
Platelets: 198 10*3/uL (ref 150–400)
RBC: 4.37 MIL/uL (ref 4.22–5.81)
RDW: 12.7 % (ref 11.5–15.5)
WBC: 7.2 10*3/uL (ref 4.0–10.5)
nRBC: 0 % (ref 0.0–0.2)

## 2022-04-06 LAB — COMPREHENSIVE METABOLIC PANEL
ALT: 23 U/L (ref 0–44)
AST: 27 U/L (ref 15–41)
Albumin: 3.9 g/dL (ref 3.5–5.0)
Alkaline Phosphatase: 81 U/L (ref 38–126)
Anion gap: 5 (ref 5–15)
BUN: 19 mg/dL (ref 8–23)
CO2: 26 mmol/L (ref 22–32)
Calcium: 8.9 mg/dL (ref 8.9–10.3)
Chloride: 109 mmol/L (ref 98–111)
Creatinine, Ser: 1.34 mg/dL — ABNORMAL HIGH (ref 0.61–1.24)
GFR, Estimated: 57 mL/min — ABNORMAL LOW (ref >60)
Glucose, Bld: 99 mg/dL (ref 70–99)
Potassium: 4.3 mmol/L (ref 3.5–5.1)
Sodium: 140 mmol/L (ref 135–145)
Total Bilirubin: 0.7 mg/dL (ref 0.3–1.2)
Total Protein: 6.7 g/dL (ref 6.5–8.1)

## 2022-04-06 LAB — URINALYSIS, ROUTINE W REFLEX MICROSCOPIC
Bilirubin Urine: NEGATIVE
Glucose, UA: NEGATIVE mg/dL
Hgb urine dipstick: NEGATIVE
Ketones, ur: NEGATIVE mg/dL
Leukocytes,Ua: NEGATIVE
Nitrite: NEGATIVE
Protein, ur: NEGATIVE mg/dL
Specific Gravity, Urine: 1.025 (ref 1.005–1.030)
pH: 6 (ref 5.0–8.0)

## 2022-04-06 LAB — LIPASE, BLOOD: Lipase: 35 U/L (ref 11–51)

## 2022-04-06 MED ORDER — HYDROMORPHONE HCL 1 MG/ML IJ SOLN
1.0000 mg | Freq: Once | INTRAMUSCULAR | Status: AC
  Administered 2022-04-06: 1 mg via INTRAVENOUS
  Filled 2022-04-06: qty 1

## 2022-04-06 MED ORDER — SODIUM CHLORIDE 0.9 % IV BOLUS
1000.0000 mL | Freq: Once | INTRAVENOUS | Status: AC
  Administered 2022-04-06: 1000 mL via INTRAVENOUS

## 2022-04-06 MED ORDER — MORPHINE SULFATE (PF) 4 MG/ML IV SOLN
4.0000 mg | Freq: Once | INTRAVENOUS | Status: AC
  Administered 2022-04-06: 4 mg via INTRAVENOUS
  Filled 2022-04-06: qty 1

## 2022-04-06 MED ORDER — ONDANSETRON HCL 4 MG/2ML IJ SOLN
4.0000 mg | Freq: Once | INTRAMUSCULAR | Status: AC
  Administered 2022-04-06: 4 mg via INTRAVENOUS
  Filled 2022-04-06: qty 2

## 2022-04-06 MED ORDER — KETOROLAC TROMETHAMINE 30 MG/ML IJ SOLN
15.0000 mg | Freq: Once | INTRAMUSCULAR | Status: AC
  Administered 2022-04-06: 15 mg via INTRAVENOUS
  Filled 2022-04-06: qty 1

## 2022-04-06 NOTE — ED Provider Notes (Signed)
?[facility] EMERGENCY DEPARTMENT ?Provider Note ? ? ?CSN: [id number] ?Arrival date & time: 04/06/22  2143 ? ?  ? ?History ? ?Chief Complaint  ?Patient presents with  ? Abdominal Pain  ? ? ?Joseph FINKLER is a 71 y.o. male.  He is here with complaint of acute onset of right lower quadrant pain that started a few hours ago.  He rates it as 8 out of 10.  No change with movement.  Did feel some pain into his right testicle and groin.  He said the testicle pain is actually been there for a few months.  No masses.  He was nauseous and vomited twice tonight.  No known fevers.  No urinary symptoms.  No blood thinners. ? ?The history is provided by the patient and the spouse.  ?Abdominal Pain ?Pain location:  RLQ ?Pain quality: aching   ?Pain radiates to:  Groin ?Pain severity:  Severe ?Onset quality:  Sudden ?Duration:  4 hours ?Timing:  Constant ?Progression:  Unchanged ?Chronicity:  New ?Context: not trauma   ?Relieved by:  Nothing ?Worsened by:  Nothing ?Ineffective treatments:  None tried ?Associated symptoms: nausea and vomiting   ?Associated symptoms: no chest pain, no constipation, no cough, no diarrhea, no dysuria, no fever, no hematuria, no shortness of breath and no sore throat   ? ?  ? ?Home Medications ?Prior to Admission medications   ?Medication Sig Start Date End Date Taking? Authorizing Provider  ?Acetaminophen (TYLENOL PO) Take by mouth daily.    [provider]  ?albuterol (VENTOLIN HFA) 108 (90 Base) MCG/ACT inhaler Inhale 2 puffs into the lungs every 6 (six) hours as needed for wheezing or shortness of breath. 04/03/22   Corwin Levins, MD  ?aspirin EC 81 MG tablet Take 81 mg by mouth daily. Swallow whole.    [provider]  ?baclofen (LIORESAL) 10 MG tablet Take 10 mg by mouth daily as needed for muscle spasms.    [provider]  ?budesonide-formoterol (SYMBICORT) 80-4.5 MCG/ACT inhaler Inhale 2 puffs into the lungs 2 (two) times daily. 04/04/22   Corwin Levins, MD   ?diclofenac Sodium (VOLTAREN) 1 % GEL Apply 2 g topically daily as needed (pain).    [provider]  ?esomeprazole (NEXIUM) 40 MG capsule Take 1 capsule (40 mg total) by mouth daily at 12 noon. Take 1 Daily 04/03/22   Corwin Levins, MD  ?gabapentin (NEURONTIN) 300 MG capsule Take 1 capsule (300 mg total) by mouth at bedtime. 1 capsule at bedtime 04/02/21   Kerrin Champagne, MD  ?metoprolol succinate (TOPROL-XL) 25 MG 24 hr tablet Take 1 tablet (25 mg total) by mouth daily. 04/03/22   Corwin Levins, MD  ?Multiple Vitamins-Minerals (CENTRUM ADULTS PO) Take by mouth.    [provider]  ?naproxen (NAPROSYN) 500 MG tablet TAKE 1 TABLET TWICE DAILY  WITH MEALS 03/11/22   Kerrin Champagne, MD  ?NAPROXEN PO Take by mouth at bedtime.    [provider]  ?rosuvastatin (CRESTOR) 20 MG tablet Take 1 tablet (20 mg total) by mouth daily. 03/10/22 06/08/22  Alver Sorrow, NP  ?   ? ?Allergies    ?Patient has no known allergies.   ? ?Review of Systems   ?Review of Systems  ?Constitutional:  Negative for fever.  ?HENT:  Negative for sore throat.   ?Respiratory:  Negative for cough and shortness of breath.   ?Cardiovascular:  Negative for chest pain.  ?Gastrointestinal:  Positive for abdominal  pain, nausea and vomiting. Negative for constipation and diarrhea.  ?Genitourinary:  Positive for testicular pain. Negative for dysuria and hematuria.  ?Musculoskeletal:  Negative for back pain.  ?Skin:  Negative for rash.  ?Neurological:  Negative for headaches.  ? ?Physical Exam ?Updated Vital Signs ?BP (!) 151/96 (BP Location: Left Arm)   Pulse 70   Temp 98 ?F (36.7 ?C) (Oral)   Resp 17   Ht 6\' 1"  (1.854 m)   Wt 108.9 kg   SpO2 97%   BMI 31.66 kg/m?  ?Physical Exam ?Vitals and nursing note reviewed.  ?Constitutional:   ?   General: He is not in acute distress. ?   Appearance: He is well-developed.  ?HENT:  ?   Head: Normocephalic and atraumatic.  ?Eyes:  ?   Conjunctiva/sclera: Conjunctivae normal.   ?Cardiovascular:  ?   Rate and Rhythm: Normal rate and regular rhythm.  ?   Heart sounds: No murmur heard. ?Pulmonary:  ?   Effort: Pulmonary effort is normal. No respiratory distress.  ?   Breath sounds: Normal breath sounds.  ?Abdominal:  ?   Palpations: Abdomen is soft.  ?   Tenderness: There is no abdominal tenderness.  ?   Hernia: A hernia (He has a small reducible nontender umbilical hernia.) is present. Hernia is present in the umbilical area. There is no hernia in the right femoral area or right inguinal area.  ?Genitourinary: ?   Testes: Normal.     ?   Right: Mass not present.     ?   Left: Mass not present.  ?Musculoskeletal:     ?   General: No swelling.  ?   Cervical back: Neck supple.  ?Skin: ?   General: Skin is warm and dry.  ?   Capillary Refill: Capillary refill takes less than 2 seconds.  ?Neurological:  ?   General: No focal deficit present.  ?   Mental Status: He is alert.  ? ? ?ED Results / Procedures / Treatments   ?Labs ?(all labs ordered are listed, but only abnormal results are displayed) ?Labs Reviewed  ?COMPREHENSIVE METABOLIC PANEL - Abnormal; Notable for the following components:  ?    Result Value  ? Creatinine, Ser 1.34 (*)   ? GFR, Estimated 57 (*)   ? All other components within normal limits  ?CBC WITH DIFFERENTIAL/PLATELET  ?URINALYSIS, ROUTINE W REFLEX MICROSCOPIC  ?LIPASE, BLOOD  ? ? ?EKG ?None ? ?Radiology ?CT Renal Stone Study ? ?Result Date: 04/06/2022 ?CLINICAL DATA:  Right lower quadrant pain and vomiting. EXAM: CT ABDOMEN AND PELVIS WITHOUT CONTRAST TECHNIQUE: Multidetector CT imaging of the abdomen and pelvis was performed following the standard protocol without IV contrast. RADIATION DOSE REDUCTION: This exam was performed according to the departmental dose-optimization program which includes automated exposure control, adjustment of the mA and/or kV according to patient size and/or use of iterative reconstruction technique. COMPARISON:  Chest CT, dated September 17, 2021.  FINDINGS: Lower chest: A stable 6 mm noncalcified lung nodule is seen within the posterolateral aspect of the left lower lobe (axial CT image 11, CT series 3). Hepatobiliary: No focal liver abnormality is seen. No gallstones, gallbladder wall thickening, or biliary dilatation. Pancreas: Unremarkable. No pancreatic ductal dilatation or surrounding inflammatory changes. Spleen: Normal in size without focal abnormality. Adrenals/Urinary Tract: Adrenal glands are unremarkable. Kidneys are normal, without renal calculi, focal lesion, or hydronephrosis. Bladder is unremarkable. Stomach/Bowel: Stomach is within normal limits. Appendix appears normal. A short segment of dilated proximal jejunum  is seen within the anterior aspect of the upper abdomen (maximum small bowel diameter of approximately 3.3 cm). Noninflamed diverticula are seen within the proximal sigmoid colon. Vascular/Lymphatic: Aortic atherosclerosis. No enlarged abdominal or pelvic lymph nodes. Reproductive: Prostate gland is mildly enlarged. Other: A 2.3 cm x 0.8 cm fat containing umbilical hernia is seen. No abdominopelvic ascites. Musculoskeletal: A metallic density intramedullary rod is seen within the proximal left femur. Multilevel degenerative changes are seen throughout the lumbar spine. IMPRESSION: 1. Short segment of dilated proximal jejunum, which may be transient in nature. Correlation with follow-up abdomen pelvis CT is recommended if an early proximal small bowel obstruction is of clinical concern 2. Sigmoid diverticulosis. 3. 6 mm, likely benign noncalcified left lower lobe lung nodule. 4. Normal appendix. Aortic Atherosclerosis (ICD10-I70.0). Electronically Signed   By: Aram Candela M.D.   On: 04/06/2022 23:34   ? ?Procedures ?Procedures  ? ? ?Medications Ordered in ED ?Medications  ?sodium chloride 0.9 % bolus 1,000 mL (has no administration in time range)  ?ondansetron (ZOFRAN) injection 4 mg (has no administration in time range)   ?morphine (PF) 4 MG/ML injection 4 mg (has no administration in time range)  ? ? ?ED Course/ Medical Decision Making/ A&P ?  ?                        ?Medical Decision Making ?Amount and/or Complexity of Data Reviewed ?Labs: ordered.

## 2022-04-06 NOTE — ED Triage Notes (Signed)
Pt is c/o right lower quadrant pain and groin pain  Pt states he has had 2 episodes of vomiting  Pt states the pain has been bad for the past 2 hours   ?

## 2022-04-07 ENCOUNTER — Encounter: Payer: Self-pay | Admitting: Internal Medicine

## 2022-04-07 ENCOUNTER — Ambulatory Visit: Payer: BC Managed Care – PPO | Admitting: Internal Medicine

## 2022-04-07 VITALS — BP 114/70 | HR 58 | Ht 73.0 in | Wt 242.0 lb

## 2022-04-07 DIAGNOSIS — I10 Essential (primary) hypertension: Secondary | ICD-10-CM

## 2022-04-07 DIAGNOSIS — K56600 Partial intestinal obstruction, unspecified as to cause: Secondary | ICD-10-CM | POA: Diagnosis not present

## 2022-04-07 DIAGNOSIS — R7302 Impaired glucose tolerance (oral): Secondary | ICD-10-CM | POA: Diagnosis not present

## 2022-04-07 DIAGNOSIS — R112 Nausea with vomiting, unspecified: Secondary | ICD-10-CM | POA: Diagnosis not present

## 2022-04-07 DIAGNOSIS — R1032 Left lower quadrant pain: Secondary | ICD-10-CM | POA: Diagnosis not present

## 2022-04-07 MED ORDER — ONDANSETRON 4 MG PO TBDP: ORAL_TABLET | ORAL | 0 refills | Status: DC

## 2022-04-07 MED ORDER — HYDROMORPHONE HCL 1 MG/ML IJ SOLN
0.5000 mg | Freq: Once | INTRAMUSCULAR | Status: AC
  Administered 2022-04-07: 0.5 mg via INTRAVENOUS
  Filled 2022-04-07: qty 1

## 2022-04-07 MED ORDER — KETOROLAC TROMETHAMINE 30 MG/ML IJ SOLN
30.0000 mg | Freq: Once | INTRAMUSCULAR | Status: AC
  Administered 2022-04-07: 30 mg via INTRAMUSCULAR

## 2022-04-07 MED ORDER — HYOSCYAMINE SULFATE 0.125 MG SL SUBL: 0.1250 mg | SUBLINGUAL_TABLET | SUBLINGUAL | 0 refills | Status: DC | PRN

## 2022-04-07 MED ORDER — OXYCODONE HCL 10 MG PO TABS: 10.0000 mg | ORAL_TABLET | Freq: Four times a day (QID) | ORAL | 0 refills | Status: DC | PRN

## 2022-04-07 NOTE — ED Notes (Signed)
Pt verbalizes understanding of discharge instructions. Opportunity for questioning and answers were provided. Pt discharged from ED to home with spouse. 

## 2022-04-07 NOTE — Progress Notes (Signed)
Patient ID: LOYDE ORTH, male   DOB: 1951/09/30, 72 y.o.   MRN: [id number] ? ? ? ?    Chief Complaint: follow up post ED visit may 7 ? ?     HPI:  [name] is a 71 y.o. male here with c/o persistent maybe slightly worse RLQ pain now 8/10 or worse at times, with mild distension and persistent vomiting though he was able to keep down some oral meds and small fluids earlier today.   Denies fever, chills, Denies urinary symptoms such as dysuria, frequency, urgency, flank pain, hematuria .  Denies worsening reflux, dysphagia,  or blood.   Has passed some gas and small BM earlier today.  Pain is simply intolerable and has not been able to pick up the levsin and zofran from pharmacy yet as rx from ED.  Pt denies chest pain, increased sob or doe, wheezing, orthopnea, PND, increased LE swelling, palpitations, dizziness or syncope. CTrenal study 5/7 with ? Early evolving PSBO and suggested f/u CT.  Pt adamant for CT but not going back to ED.  .   ?      ?Wt Readings from Last 3 Encounters:  ?04/07/22 242 lb (109.8 kg)  ?04/06/22 240 lb (108.9 kg)  ?04/03/22 242 lb 12.8 oz (110.1 kg)  ? ?BP Readings from Last 3 Encounters:  ?04/07/22 114/70  ?04/07/22 (!) 133/91  ?04/03/22 122/68  ? ?      ?Past Medical History:  ?Diagnosis Date  ? ALLERGIC RHINITIS 12/31/2007  ? Chronic combined systolic and diastolic heart failure (HCC) 06/14/2021  ? COUGH, CHRONIC 07/18/2007  ? DEGENERATIVE JOINT DISEASE 07/18/2007  ? Extrinsic asthma, unspecified 09/08/2009  ? GERD 07/18/2007  ? HYPERLIPIDEMIA 08/14/2010  ? Hypertension   ? Impaired glucose tolerance 09/23/2011  ? OBESITY 07/18/2007  ? ?Past Surgical History:  ?Procedure Laterality Date  ? BACK SURGERY    ? CARDIOVERSION N/A 12/28/2020  ? Procedure: CARDIOVERSION;  Surgeon: Chilton Si, MD;  Location: [facility] ENDOSCOPY;  Service: Cardiovascular;  Laterality: N/A;  ? DG FEMUR LEFT  ([facility] HX)    ? rod placed  ? TEE WITHOUT CARDIOVERSION N/A 12/28/2020  ? Procedure: TRANSESOPHAGEAL  ECHOCARDIOGRAM (TEE);  Surgeon: Chilton Si, MD;  Location: [facility] ENDOSCOPY;  Service: Cardiovascular;  Laterality: N/A;  ? TOOTH EXTRACTION    ? TOTAL KNEE ARTHROPLASTY Right 07/26/2018  ? Procedure: RIGHT TOTAL KNEE ARTHROPLASTY;  Surgeon: Kerrin Champagne, MD;  Location: [facility] OR;  Service: Orthopedics;  Laterality: Right;  ? ? reports that he has quit smoking. He has never used smokeless tobacco. He reports current alcohol use of about 2.0 - 4.0 standard drinks per week. He reports that he does not use drugs. ?family history includes Diabetes in an other family member. ?No Known Allergies ?Current Outpatient Medications on File Prior to Visit  ?Medication Sig Dispense Refill  ? Acetaminophen (TYLENOL PO) Take by mouth daily.    ? albuterol (VENTOLIN HFA) 108 (90 Base) MCG/ACT inhaler Inhale 2 puffs into the lungs every 6 (six) hours as needed for wheezing or shortness of breath. 3 each 3  ? aspirin EC 81 MG tablet Take 81 mg by mouth daily. Swallow whole.    ? baclofen (LIORESAL) 10 MG tablet Take 10 mg by mouth daily as needed for muscle spasms.    ? budesonide-formoterol (SYMBICORT) 80-4.5 MCG/ACT inhaler Inhale 2 puffs into the lungs 2 (two) times daily. 3 each 3  ? diclofenac Sodium (VOLTAREN) 1 % GEL Apply 2 g topically  daily as needed (pain).    ? esomeprazole (NEXIUM) 40 MG capsule Take 1 capsule (40 mg total) by mouth daily at 12 noon. Take 1 Daily 90 capsule 3  ? gabapentin (NEURONTIN) 300 MG capsule Take 1 capsule (300 mg total) by mouth at bedtime. 1 capsule at bedtime 180 capsule 3  ? hyoscyamine (LEVSIN SL) 0.125 MG SL tablet Place 1 tablet (0.125 mg total) under the tongue every 4 (four) hours as needed for cramping (abdominal pain). 20 tablet 0  ? metoprolol succinate (TOPROL-XL) 25 MG 24 hr tablet Take 1 tablet (25 mg total) by mouth daily. 90 tablet 3  ? Multiple Vitamins-Minerals (CENTRUM ADULTS PO) Take by mouth.    ? naproxen (NAPROSYN) 500 MG tablet TAKE 1 TABLET TWICE DAILY  WITH MEALS 180  tablet 3  ? NAPROXEN PO Take by mouth at bedtime.    ? ondansetron (ZOFRAN-ODT) 4 MG disintegrating tablet 4mg  ODT q4 hours prn nausea/vomit 10 tablet 0  ? rosuvastatin (CRESTOR) 20 MG tablet Take 1 tablet (20 mg total) by mouth daily. 90 tablet 3  ? ?No current facility-administered medications on file prior to visit.  ? ?     ROS:  All others reviewed and negative. ? ?Objective  ? ?     PE:  BP 114/70 (BP Location: Right Arm, Patient Position: Sitting, Cuff Size: Large)   Pulse (!) 58   Ht 6\' 1"  (1.854 m)   Wt 242 lb (109.8 kg)   SpO2 93%   BMI 31.93 kg/m?  ? ?              Constitutional: Pt appears in NAD, non toxic but forced to lie down due to pain worse to sit up ?              HENT: Head: NCAT.  ?              Right Ear: External ear normal.   ?              Left Ear: External ear normal.  ?              Eyes: . Pupils are equal, round, and reactive to light. Conjunctivae and EOM are normal ?              Nose: without d/c or deformity ?              Neck: Neck supple. Gross normal ROM ?              Cardiovascular: Normal rate and regular rhythm.   ?              Pulmonary/Chest: Effort normal and breath sounds without rales or wheezing.  ?              Abd:  Soft, mod to severe tender RLQ,? Mild distended, + BS, no organomegaly; no guarding or rebound ?              Neurological: Pt is alert. At baseline orientation, motor grossly intact ?              Skin: Skin is warm. No rashes, no other new lesions, LE edema - none ?              Psychiatric: Pt behavior is normal without agitation  ? ?Micro: none ? ?Cardiac tracings I have personally interpreted today:  none ? ?Pertinent Radiological findings (summarize): none  ? ?Lab Results  ?  Component Value Date  ? WBC 7.2 04/06/2022  ? HGB 13.5 04/06/2022  ? HCT 40.8 04/06/2022  ? PLT 198 04/06/2022  ? GLUCOSE 99 04/06/2022  ? CHOL 182 03/07/2022  ? TRIG 100 03/07/2022  ? HDL 46 03/07/2022  ? LDLDIRECT 132.9 09/23/2011  ? LDLCALC 118 (H) 03/07/2022  ? ALT 23  04/06/2022  ? AST 27 04/06/2022  ? NA 140 04/06/2022  ? K 4.3 04/06/2022  ? CL 109 04/06/2022  ? CREATININE 1.34 (H) 04/06/2022  ? BUN 19 04/06/2022  ? CO2 26 04/06/2022  ? TSH 1.36 04/03/2022  ? PSA 0.90 04/03/2022  ? INR 0.96 07/16/2018  ? HGBA1C 5.8 04/03/2022  ? ?Assessment/Plan:  ?Eino FarberRobert M Deer is a 71 y.o. White or Caucasian [1] male with  has a past medical history of ALLERGIC RHINITIS (12/31/2007), Chronic combined systolic and diastolic heart failure (HCC) (4/54/09817/15/2022), COUGH, CHRONIC (07/18/2007), DEGENERATIVE JOINT DISEASE (07/18/2007), Extrinsic asthma, unspecified (09/08/2009), GERD (07/18/2007), HYPERLIPIDEMIA (08/14/2010), Hypertension, Impaired glucose tolerance (09/23/2011), and OBESITY (07/18/2007). ? ?Partial intestinal obstruction (HCC) ?Probable early PSBO with persistent severe pain, CT and labs 5/7 without specific abnormal except possible early PSBO; will hold on repeat labs but for repeat CT abd/pelvis no CM, pain control with oxycodone 10 q6 prn, and toradol 30 mg IM now, continue zofran, and d/w pt will need repeat ED visit for persistent vomiting, unable to take po, not passing gas or BM ? ?HTN (hypertension) ?BP Readings from Last 3 Encounters:  ?04/07/22 114/70  ?04/07/22 (!) 133/91  ?04/03/22 122/68  ? ?Stable, pt to continue medical treatment toprol  ? ? ?Impaired glucose tolerance ?Lab Results  ?Component Value Date  ? HGBA1C 5.8 04/03/2022  ? ?Stable, pt to continue current medical treatment diet ? ?Followup: Return if symptoms worsen or fail to improve. ? ?Oliver BarreJames Aksh Swart, MD 04/07/2022 7:58 PM ?[facility] ?[facility] ?Internal Medicine ?

## 2022-04-07 NOTE — Assessment & Plan Note (Signed)
BP Readings from Last 3 Encounters:  ?04/07/22 114/70  ?04/07/22 (!) 133/91  ?04/03/22 122/68  ? ?Stable, pt to continue medical treatment toprol  ? ?

## 2022-04-07 NOTE — Assessment & Plan Note (Signed)
?

## 2022-04-07 NOTE — Addendum Note (Signed)
Addended by: Corwin Levins on: 04/07/2022 08:00 PM ? ? Modules accepted: Orders ? ?

## 2022-04-07 NOTE — Patient Instructions (Addendum)
You had the toradol 30 mg pain shot today ? ?Please take all new medication as prescribed - the oxycodone as needed ? ?Please continue all other medications as before, including the zofran for vomiting ? ?Please have the pharmacy call with any other refills you may need. ? ?Please keep your appointments with your specialists as you may have planned ? ?You will be contacted regarding the referral for: stat CT scan ? ?Please plan to return to ED for persistent vomiting where you cannot take pills or fluids, or any other unusual problems such as worsening pain, fever, blood or other ?

## 2022-04-07 NOTE — ED Provider Notes (Signed)
Patient signed out to me by Dr. Charm Barges.  CT scan pending.  Patient with left lower abdominal pain with nausea and vomiting.  CT scan has been performed and interpreted by radiology.  There is a short dilated segment of jejunum present, no other findings.  Upon repeat examination, abdomen is soft.  He is nontender.  He has bowel sounds.  Doubt partial small bowel obstruction at this time.  Discussed with him the possibility of developing small bowel obstruction and return precautions.  Will discharge with symptomatic treatment. ?  ?Gilda Crease, MD ?04/07/22 0023 ? ?

## 2022-04-07 NOTE — Assessment & Plan Note (Signed)
Probable early PSBO with persistent severe pain, CT and labs 5/7 without specific abnormal except possible early PSBO; will hold on repeat labs but for repeat CT abd/pelvis no CM, pain control with oxycodone 10 q6 prn, and toradol 30 mg IM now, continue zofran, and d/w pt will need repeat ED visit for persistent vomiting, unable to take po, not passing gas or BM ?

## 2022-04-08 ENCOUNTER — Other Ambulatory Visit: Payer: Self-pay | Admitting: Internal Medicine

## 2022-04-08 ENCOUNTER — Telehealth: Payer: Self-pay | Admitting: Internal Medicine

## 2022-04-08 ENCOUNTER — Ambulatory Visit
Admission: RE | Admit: 2022-04-08 | Discharge: 2022-04-08 | Disposition: A | Discharge status: Home / Self Care | Payer: BC Managed Care – PPO | Source: Ambulatory Visit | Attending: Internal Medicine | Admitting: Internal Medicine

## 2022-04-08 DIAGNOSIS — R109 Unspecified abdominal pain: Secondary | ICD-10-CM | POA: Diagnosis not present

## 2022-04-08 DIAGNOSIS — J209 Acute bronchitis, unspecified: Secondary | ICD-10-CM

## 2022-04-08 DIAGNOSIS — K56600 Partial intestinal obstruction, unspecified as to cause: Secondary | ICD-10-CM

## 2022-04-08 NOTE — Telephone Encounter (Signed)
Per chart MD place referral for CT, and [facility] sent it directly [facility]  will contact pt and schedule. Notified pt w/ status and given [facility] #[email or web address] ?

## 2022-04-08 NOTE — Telephone Encounter (Signed)
Patient states that he has still not heard anything from [facility] about his emergency CAT scan.  Please advise. ?

## 2022-04-09 ENCOUNTER — Telehealth: Payer: Self-pay

## 2022-04-09 ENCOUNTER — Other Ambulatory Visit: Payer: Self-pay | Admitting: Internal Medicine

## 2022-04-09 ENCOUNTER — Encounter: Payer: Self-pay | Admitting: Internal Medicine

## 2022-04-09 DIAGNOSIS — R1031 Right lower quadrant pain: Secondary | ICD-10-CM

## 2022-04-09 DIAGNOSIS — J209 Acute bronchitis, unspecified: Secondary | ICD-10-CM

## 2022-04-09 MED ORDER — ALBUTEROL SULFATE HFA 108 (90 BASE) MCG/ACT IN AERS: 2.0000 | INHALATION_SPRAY | Freq: Four times a day (QID) | RESPIRATORY_TRACT | 3 refills | Status: AC | PRN

## 2022-04-09 NOTE — Telephone Encounter (Signed)
Pt has a rx for Proair but no directions on how to take it pharmacy calling to verify the directions ?  ?[phone] ? ?REF Henning:[id number] ? ?Please advise ?

## 2022-04-09 NOTE — Telephone Encounter (Signed)
Pt is calling to to see what the results to his STAT CT scan is. I advised the pt that I didn't see the results as of yet. ? ?Please advise ?

## 2022-04-11 DIAGNOSIS — N50811 Right testicular pain: Secondary | ICD-10-CM | POA: Diagnosis not present

## 2022-04-11 DIAGNOSIS — R109 Unspecified abdominal pain: Secondary | ICD-10-CM | POA: Diagnosis not present

## 2022-06-27 ENCOUNTER — Encounter: Payer: Self-pay | Admitting: Gastroenterology

## 2022-07-23 ENCOUNTER — Encounter: Payer: Self-pay | Admitting: Gastroenterology

## 2022-07-23 ENCOUNTER — Ambulatory Visit: Payer: BC Managed Care – PPO | Admitting: Gastroenterology

## 2022-07-23 VITALS — BP 100/80 | HR 84 | Ht 70.25 in | Wt 241.1 lb

## 2022-07-23 DIAGNOSIS — R1031 Right lower quadrant pain: Secondary | ICD-10-CM

## 2022-07-23 DIAGNOSIS — K429 Umbilical hernia without obstruction or gangrene: Secondary | ICD-10-CM

## 2022-07-23 DIAGNOSIS — K439 Ventral hernia without obstruction or gangrene: Secondary | ICD-10-CM | POA: Diagnosis not present

## 2022-07-23 DIAGNOSIS — K4090 Unilateral inguinal hernia, without obstruction or gangrene, not specified as recurrent: Secondary | ICD-10-CM | POA: Diagnosis not present

## 2022-07-23 DIAGNOSIS — Z1211 Encounter for screening for malignant neoplasm of colon: Secondary | ICD-10-CM

## 2022-07-23 NOTE — Progress Notes (Signed)
Chief Complaint: RLQ pain, nausea/vomiting   Referring Provider:     Corwin Levins, MD   HPI:     [name] is a 71 y.o. male with a history of HTN, HLD, IFG, atrial flutter s/p cardioversion 12/2020, CHF w/ pEF, GERD, referred to the Gastroenterology Clinic for evaluation of RLQ pain and nausea/vomiting.  RLQ pain started acutely on 04/06/22. +n/v. No radiation, change in bowel habits, hematochezia, melena.   -04/06/2022: Evaluation in ER for RLQ pain.  Creatinine 1.34, otherwise normal CMP, CBC, lipase, UA.  CT renal protocol with short segment of dilated proximal jejunum, which may be transient in nature, sigmoid diverticulosis without diverticulitis.  Treated with IV morphine and dilaudid. Was discharged home with Levsin and Zofran ODT. - 04/07/2022: Follow-up with PCM with continued RLQ pain.  Was given Toradol, prescribed oxycodone - 04/08/2022: CT A/P: b/l inguinal and tiny umbilical hernia. Otherwise, normal - 04/10/2022: Was seen by Urology and diagnosed with inguinal hernia by exam.   RLQ pain has since resolved.  Occasional episodes of mild dicomfort, but no recurrence of pain. N/v resolved completely. Tolerateing all PO intake. BM at baseline. No overt GI bleed.   Endoscopic History: - Colonoscopy (01/2005): Normal - Cologuard negative 07/2020   Past Medical History:  Diagnosis Date   ALLERGIC RHINITIS 12/31/2007   Chronic combined systolic and diastolic heart failure (HCC) 06/14/2021   COUGH, CHRONIC 07/18/2007   DEGENERATIVE JOINT DISEASE 07/18/2007   Extrinsic asthma, unspecified 09/08/2009   GERD 07/18/2007   HYPERLIPIDEMIA 08/14/2010   Hypertension    Impaired glucose tolerance 09/23/2011   OBESITY 07/18/2007     Past Surgical History:  Procedure Laterality Date   BACK SURGERY     CARDIOVERSION N/A 12/28/2020   Procedure: CARDIOVERSION;  Surgeon: Chilton Si, MD;  Location: [facility] ENDOSCOPY;  Service: Cardiovascular;  Laterality: N/A;   DG FEMUR LEFT   ([facility] HX)     rod placed   TEE WITHOUT CARDIOVERSION N/A 12/28/2020   Procedure: TRANSESOPHAGEAL ECHOCARDIOGRAM (TEE);  Surgeon: Chilton Si, MD;  Location: [facility] ENDOSCOPY;  Service: Cardiovascular;  Laterality: N/A;   TOOTH EXTRACTION     TOTAL KNEE ARTHROPLASTY Right 07/26/2018   Procedure: RIGHT TOTAL KNEE ARTHROPLASTY;  Surgeon: Kerrin Champagne, MD;  Location: [facility] OR;  Service: Orthopedics;  Laterality: Right;   Family History  Problem Relation Age of Onset   Diabetes Other    Social History   Tobacco Use   Smoking status: Former   Smokeless tobacco: Never   Tobacco comments:    Marijuana 30 years  Vaping Use   Vaping Use: Never used  Substance Use Topics   Alcohol use: Yes    Alcohol/week: 2.0 - 4.0 standard drinks of alcohol    Types: 2 - 4 Cans of beer per week    Comment: moderate   Drug use: Never   Current Outpatient Medications  Medication Sig Dispense Refill   Acetaminophen (TYLENOL PO) Take by mouth daily.     albuterol (PROAIR HFA) 108 (90 Base) MCG/ACT inhaler Inhale 2 puffs into the lungs every 6 (six) hours as needed for wheezing or shortness of breath. 24 g 3   aspirin EC 81 MG tablet Take 81 mg by mouth daily. Swallow whole.     budesonide-formoterol (SYMBICORT) 80-4.5 MCG/ACT inhaler Inhale 2 puffs into the lungs 2 (two) times daily. 3 each 3   esomeprazole (NEXIUM) 40 MG capsule Take  1 capsule (40 mg total) by mouth daily at 12 noon. Take 1 Daily (Patient taking differently: Take 40 mg by mouth as needed.) 90 capsule 3   gabapentin (NEURONTIN) 300 MG capsule Take 1 capsule (300 mg total) by mouth at bedtime. 1 capsule at bedtime 180 capsule 3   metoprolol succinate (TOPROL-XL) 25 MG 24 hr tablet Take 1 tablet (25 mg total) by mouth daily. 90 tablet 3   Multiple Vitamins-Minerals (CENTRUM ADULTS PO) Take by mouth.     naproxen (NAPROSYN) 500 MG tablet TAKE 1 TABLET TWICE DAILY  WITH MEALS 180 tablet 3   rosuvastatin (CRESTOR) 20 MG tablet Take 1 tablet (20  mg total) by mouth daily. 90 tablet 3   baclofen (LIORESAL) 10 MG tablet Take 10 mg by mouth daily as needed for muscle spasms. (Patient not taking: Reported on 07/23/2022)     diclofenac Sodium (VOLTAREN) 1 % GEL Apply 2 g topically daily as needed (pain). (Patient not taking: Reported on 07/23/2022)     hyoscyamine (LEVSIN SL) 0.125 MG SL tablet Place 1 tablet (0.125 mg total) under the tongue every 4 (four) hours as needed for cramping (abdominal pain). (Patient not taking: Reported on 07/23/2022) 20 tablet 0   ondansetron (ZOFRAN-ODT) 4 MG disintegrating tablet 4mg  ODT q4 hours prn nausea/vomit (Patient not taking: Reported on 07/23/2022) 10 tablet 0   Oxycodone HCl 10 MG TABS Take 1 tablet (10 mg total) by mouth 4 (four) times daily as needed. (Patient not taking: Reported on 07/23/2022) 30 tablet 0   No current facility-administered medications for this visit.   No Known Allergies   Review of Systems: All systems reviewed and negative except where noted in HPI.     Physical Exam:    Wt Readings from Last 3 Encounters:  07/23/22 241 lb 2 oz (109.4 kg)  04/07/22 242 lb (109.8 kg)  04/06/22 240 lb (108.9 kg)    Ht 5' 10.25" (1.784 m) Comment: height measured without shoes  Wt 241 lb 2 oz (109.4 kg)   BMI 34.35 kg/m  Constitutional:  Pleasant, in no acute distress. Psychiatric: Normal mood and affect. Behavior is normal. Cardiovascular: Normal rate, regular rhythm. No edema Pulmonary/chest: Effort normal and breath sounds normal. No wheezing, rales or rhonchi. Abdominal: Ventral hernia, small umbilical hernia. Otherwise, soft, nondistended, nontender. Bowel sounds active throughout.  Neurological: Alert and oriented to person place and time. Skin: Skin is warm and dry. No rashes noted.   ASSESSMENT AND PLAN;   1) Inguinal hernia 2) RLQ pain Pain has essentially resolved.  CT and clinical history most consistent with inguinal hernia pain.  Discussed referral to General Surgery  for inguinal hernia repair, but he declined that referral today.  If symptoms recur, he will call our office or his PCM to request referral to General Surgery  3) Umbilical hernia 4) Ventral hernia - As above, declined surgical referral  5) Colon cancer screening - Up-to-date on CRC screening, having completed Cologuard (negative) in 2021. - Repeat CRC screening in 2024 with either Cologuard or colonoscopy  RTC prn   2025, DO, FACG  07/23/2022, 3:24 PM   07/25/2022, MD

## 2022-07-23 NOTE — Patient Instructions (Addendum)
If you are age 69 or older, your body mass index should be between 23-30. Your Body mass index is 34.35 kg/m. If this is out of the aforementioned range listed, please consider follow up with your Primary Care Provider.  __________________________________________________________  The [facility] providers would like to encourage you to use [facility] to communicate with providers for non-urgent requests or questions.  Due to long hold times on the telephone, sending your provider a message by [facility] may be a faster and more efficient way to get a response.  Please allow 48 business hours for a response.  Please remember that this is for non-urgent requests.    Due to recent changes in healthcare laws, you may see the results of your imaging and laboratory studies on MyChart before your provider has had a chance to review them.  We understand that in some cases there may be results that are confusing or concerning to you. Not all laboratory results come back in the same time frame and the provider may be waiting for multiple results in order to interpret others.  Please give [place] 48 hours in order for your provider to thoroughly review all the results before contacting the office for clarification of your results.   Follow up as needed.   Thank you for choosing me and [facility].  Vito Cirigliano, D.O.

## 2022-08-19 ENCOUNTER — Other Ambulatory Visit: Payer: Self-pay

## 2022-08-19 ENCOUNTER — Telehealth: Payer: Self-pay | Admitting: Physical Medicine and Rehabilitation

## 2022-08-19 DIAGNOSIS — M25511 Pain in right shoulder: Secondary | ICD-10-CM

## 2022-08-19 NOTE — Telephone Encounter (Signed)
scheduled

## 2022-08-19 NOTE — Telephone Encounter (Signed)
Patient called needing an appointment with Dr. Ernestina Patches for his shoulder. The number to contact patient is [phone]

## 2022-08-20 ENCOUNTER — Other Ambulatory Visit (HOSPITAL_BASED_OUTPATIENT_CLINIC_OR_DEPARTMENT_OTHER): Payer: Self-pay | Admitting: Family

## 2022-08-21 DIAGNOSIS — L814 Other melanin hyperpigmentation: Secondary | ICD-10-CM | POA: Diagnosis not present

## 2022-08-21 DIAGNOSIS — D225 Melanocytic nevi of trunk: Secondary | ICD-10-CM | POA: Diagnosis not present

## 2022-08-21 DIAGNOSIS — D492 Neoplasm of unspecified behavior of bone, soft tissue, and skin: Secondary | ICD-10-CM | POA: Diagnosis not present

## 2022-08-21 DIAGNOSIS — B079 Viral wart, unspecified: Secondary | ICD-10-CM | POA: Diagnosis not present

## 2022-08-21 DIAGNOSIS — L570 Actinic keratosis: Secondary | ICD-10-CM | POA: Diagnosis not present

## 2022-08-21 DIAGNOSIS — L538 Other specified erythematous conditions: Secondary | ICD-10-CM | POA: Diagnosis not present

## 2022-08-21 DIAGNOSIS — L821 Other seborrheic keratosis: Secondary | ICD-10-CM | POA: Diagnosis not present

## 2022-08-21 LAB — HEPATIC FUNCTION PANEL
ALT: 20 IU/L (ref 0–44)
AST: 22 IU/L (ref 0–40)
Albumin: 4.3 g/dL (ref 3.9–4.9)
Alkaline Phosphatase: 93 IU/L (ref 44–121)
Bilirubin Total: 0.6 mg/dL (ref 0.0–1.2)
Bilirubin, Direct: 0.16 mg/dL (ref 0.00–0.40)
Total Protein: 6.5 g/dL (ref 6.0–8.5)

## 2022-08-21 LAB — LIPID PANEL
Chol/HDL Ratio: 2.6 ratio (ref 0.0–5.0)
Cholesterol, Total: 132 mg/dL (ref 100–199)
HDL: 50 mg/dL (ref >39)
LDL Chol Calc (NIH): 64 mg/dL (ref 0–99)
Triglycerides: 96 mg/dL (ref 0–149)
VLDL Cholesterol Cal: 18 mg/dL (ref 5–40)

## 2022-08-26 ENCOUNTER — Other Ambulatory Visit: Payer: Self-pay | Admitting: Cardiovascular Disease

## 2022-08-26 ENCOUNTER — Telehealth: Payer: Self-pay | Admitting: *Deleted

## 2022-08-26 ENCOUNTER — Ambulatory Visit (INDEPENDENT_AMBULATORY_CARE_PROVIDER_SITE_OTHER): Payer: BC Managed Care – PPO | Admitting: Cardiovascular Disease

## 2022-08-26 ENCOUNTER — Encounter (HOSPITAL_BASED_OUTPATIENT_CLINIC_OR_DEPARTMENT_OTHER): Payer: Self-pay | Admitting: Cardiovascular Disease

## 2022-08-26 ENCOUNTER — Telehealth (HOSPITAL_BASED_OUTPATIENT_CLINIC_OR_DEPARTMENT_OTHER): Payer: Self-pay | Admitting: *Deleted

## 2022-08-26 VITALS — BP 112/82 | HR 88 | Ht 70.0 in | Wt 241.0 lb

## 2022-08-26 DIAGNOSIS — I10 Essential (primary) hypertension: Secondary | ICD-10-CM

## 2022-08-26 DIAGNOSIS — I4892 Unspecified atrial flutter: Secondary | ICD-10-CM

## 2022-08-26 DIAGNOSIS — I700 Atherosclerosis of aorta: Secondary | ICD-10-CM

## 2022-08-26 DIAGNOSIS — R0683 Snoring: Secondary | ICD-10-CM

## 2022-08-26 DIAGNOSIS — I483 Typical atrial flutter: Secondary | ICD-10-CM

## 2022-08-26 DIAGNOSIS — R400 Somnolence: Secondary | ICD-10-CM

## 2022-08-26 DIAGNOSIS — I5042 Chronic combined systolic (congestive) and diastolic (congestive) heart failure: Secondary | ICD-10-CM | POA: Diagnosis not present

## 2022-08-26 DIAGNOSIS — E7849 Other hyperlipidemia: Secondary | ICD-10-CM

## 2022-08-26 HISTORY — DX: Atherosclerosis of aorta: I70.0

## 2022-08-26 MED ORDER — APIXABAN 5 MG PO TABS: 5.0000 mg | ORAL_TABLET | Freq: Two times a day (BID) | ORAL | 1 refills | Status: DC

## 2022-08-26 NOTE — Telephone Encounter (Signed)
Patient ok to start [name], code 1234  Patient needs to stop Naproxen since starting Eliquis  Patient needing deep teeth/gum cleaning, will only do 1/2 mouth at a time The will numb mouth and go under gums cleaning, does he need to hold Eliquis  Will forward to Pharm D for review

## 2022-08-26 NOTE — Assessment & Plan Note (Signed)
Blood pressures well controlled both here and at home.  Continue metoprolol.  Encouraged him to work on increasing his exercise.

## 2022-08-26 NOTE — Telephone Encounter (Signed)
Per [facility] portal no PA is required for sleep studies. Alvina Filbert notified  via staff message ok to activate itamar.

## 2022-08-26 NOTE — Assessment & Plan Note (Signed)
Lipids are very well controlled on rosuvastatin.  Continue current dose.

## 2022-08-26 NOTE — H&P (View-Only) (Signed)
Cardiology Office Note:    Date:  08/26/2022   ID:  [name], DOB 11-14-1951, MRN [id number]  PCP:  Joseph Borg, MD   [name] Cardiologist:  Joseph Latch, MD     Referring MD: Joseph Borg, MD   No chief complaint on file.   History of Present Illness:    [name] is a 71 y.o. male with a hx of aortic atherosclerosis, ascending aortic aneurysm, paroxysmal atrial fibrillation, chronic systolic diastolic heart failure (recovered LVEF), hyperlipidemia, hypertension, obesity, and GERD, here for follow-up. He was first seen in the Atrial fibrillation clinic 12/2020 for atrial flutter. He was started on eliquis and metoprolol. He had a TEE/DCCV 12/2020. The echo revealed LVEF less then 20% with global hypokinesis and a secundum ASD. He followed up with Joseph Busing, DNP and was referred for repeat Echo, where his LVEF had improved to 50%.  At the last visit he reported exertional dyspnea, despite being in sinus rhythm. He had a coronary CTA 05/2021 that showed minimal non-calcified plaque. His calcium score was 0. He had aortic atherosclerosis, and his aorta was 3.9 cm. He had an Echo  01/2022 which revealed LVEF 55-60%, mild LVH, and grade 1 diastolic dysfunction. The aortic root was 4.6 cm, and the ascending aorta was 4.4 cm. He saw Joseph Montana, NP 01/2022 and was feeling well.   Today, he states he has started checking his blood pressure more frequently in the past few weeks. He is concerned that his heart rate seems to be low as he has readings such as 46, 53, and 58 bpm. Additionally he complains of a central chest soreness with onset 2 weeks ago and tenderness to palpation. He describes his chest soreness as "being punched in the chest." He struggles with becoming fatigued easily, which has been ongoing for several years. Of note, he plans to retire soon. 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 With activity he  generally feels fatigued with no anginal symptoms. In 2019 during a right total knee arthroplasty he developed a thrombus in his calf and his blood thinner was discontinued. He denies any significant smoking history. He denies any palpitations, shortness of breath, or peripheral edema. No lightheadedness, headaches, syncope, orthopnea, or PND.   Past Medical History:  Diagnosis Date   ALLERGIC RHINITIS 12/31/2007   Aortic atherosclerosis ([place]) 08/26/2022   Arthritis    Chronic combined systolic and diastolic heart failure ([place]) 06/14/2021   COUGH, CHRONIC 07/18/2007   DEGENERATIVE JOINT DISEASE 07/18/2007   Extrinsic asthma, unspecified 09/08/2009   GERD 07/18/2007   HYPERLIPIDEMIA 08/14/2010   Hypertension    Impaired glucose tolerance 09/23/2011   OBESITY 07/18/2007   Pneumonia     Past Surgical History:  Procedure Laterality Date   CARDIOVERSION N/A 12/28/2020   Procedure: CARDIOVERSION;  Surgeon: Joseph Latch, MD;  Location: [facility];  Service: Cardiovascular;  Laterality: N/A;   DG FEMUR LEFT  ([facility] HX)     rod placed   LUMBAR [place]     TEE WITHOUT CARDIOVERSION N/A 12/28/2020   Procedure: TRANSESOPHAGEAL ECHOCARDIOGRAM (TEE);  Surgeon: Joseph Latch, MD;  Location: [facility];  Service: Cardiovascular;  Laterality: N/A;   TOOTH EXTRACTION     TOTAL KNEE ARTHROPLASTY Right 07/26/2018   Procedure: RIGHT TOTAL KNEE ARTHROPLASTY;  Surgeon: Jessy Oto, MD;  Location: [place];  Service: Orthopedics;  Laterality: Right;    Current Medications: Current Meds  Medication Sig  Acetaminophen (TYLENOL PO) Take by mouth daily.   albuterol (PROAIR HFA) 108 (90 Base) MCG/ACT inhaler Inhale 2 puffs into the lungs every 6 (six) hours as needed for wheezing or shortness of breath.   apixaban (ELIQUIS) 5 MG TABS tablet Take 1 tablet (5 mg total) by mouth 2 (two) times daily.   budesonide-formoterol (SYMBICORT) 80-4.5 MCG/ACT inhaler Inhale 2 puffs into the lungs 2  (two) times daily.   diclofenac Sodium (VOLTAREN) 1 % GEL Apply 2 g topically daily as needed (pain).   esomeprazole (NEXIUM) 40 MG capsule Take 1 capsule (40 mg total) by mouth daily at 12 noon. Take 1 Daily (Patient taking differently: Take 40 mg by mouth as needed.)   gabapentin (NEURONTIN) 300 MG capsule Take 1 capsule (300 mg total) by mouth at bedtime. 1 capsule at bedtime   metoprolol succinate (TOPROL-XL) 25 MG 24 hr tablet Take 1 tablet (25 mg total) by mouth daily.   Multiple Vitamins-Minerals (CENTRUM ADULTS PO) Take by mouth.   naproxen (NAPROSYN) 500 MG tablet TAKE 1 TABLET TWICE DAILY  WITH MEALS   rosuvastatin (CRESTOR) 20 MG tablet Take 1 tablet (20 mg total) by mouth daily.   [DISCONTINUED] aspirin EC 81 MG tablet Take 81 mg by mouth daily. Swallow whole.     Allergies:   Patient has no known allergies.   Social History   Socioeconomic History   Marital status: Married    Spouse name: Not on file   Number of children: 1   Years of education: Not on file   Highest education level: Not on file  Occupational History   Occupation: truck driver  Tobacco Use   Smoking status: Never   Smokeless tobacco: Never   Tobacco comments:    Marijuana 30 years  Vaping Use   Vaping Use: Never used  Substance and Sexual Activity   Alcohol use: Yes    Alcohol/week: 2.0 - 4.0 standard drinks of alcohol    Types: 2 - 4 Cans of beer per week    Comment: moderate   Drug use: Not Currently    Types: Marijuana    Comment: as a teenager   Sexual activity: Not on file  Other Topics Concern   Not on file  Social History Narrative   Not on file   Social Determinants of Health   Financial Resource Strain: Not on file  Food Insecurity: Not on file  Transportation Needs: Not on file  Physical Activity: Not on file  Stress: Not on file  Social Connections: Not on file     Family History: The patient's family history includes Cerebral palsy in his sister; Diabetes in his maternal  grandmother; Lupus in his father.  ROS:   Please see the history of present illness.    (+) Fatigue (+) Chest soreness All other systems reviewed and are negative.  EKGs/Labs/Other Studies Reviewed:    The following studies were reviewed today:  Echo 01/03/2022:  1. Left ventricular ejection fraction, by estimation, is 55 to 60%. The  left ventricle has normal function. The left ventricle has no regional  wall motion abnormalities. There is mild concentric left ventricular  hypertrophy of the septal segment. Left  ventricular diastolic parameters are consistent with Grade I diastolic  dysfunction (impaired relaxation). The average left ventricular global  longitudinal strain is 19.8 %. The global longitudinal strain is normal.   2. Right ventricular systolic function is normal. The right ventricular  size is normal. There is normal pulmonary artery systolic  pressure.   3. Left atrial size was mildly dilated.   4. Right atrial size was mildly dilated.   5. The mitral valve is normal in structure. No evidence of mitral valve  regurgitation. No evidence of mitral stenosis.   6. The aortic valve is tricuspid. Aortic valve regurgitation is trivial.  No aortic stenosis is present.   7. Aortic dilatation noted. There is moderate dilatation of the aortic  root, measuring 46 mm. There is moderate dilatation of the ascending  aorta, measuring 44 mm.   8. The inferior vena cava is normal in size with greater than 50%  respiratory variability, suggesting right atrial pressure of 3 mmHg.   CT Chest  09/17/2021: IMPRESSION: 1. Stable small bilateral pulmonary nodules from baseline chest CT of 9 months ago, supporting a benign etiology. This appearance is almost certainly benign, and no dedicated follow-up is required if this patient is low risk for bronchogenic carcinoma (and has no known or suspected primary neoplasm). Non-contrast chest CT can be considered in 12 months if patient is  high-risk. This recommendation follows the consensus statement: Guidelines for Management of Incidental Pulmonary Nodules Detected on CT Images: From the Fleischner Society 2017; Radiology 2017; 284:228-243. 2. Mild Aortic Atherosclerosis (ICD10-I70.0) and Emphysema (ICD10-J43.9).  Coronary CTA  06/28/2021: IMPRESSION: 1. Significant motion artifact, but did not impair interpretation. Minimal non-calcified CAD, CADRADS = 1.   2. Coronary calcium score of 0. This was 0 percentile for age and sex matched control.   3. Normal coronary origin with right dominance.   4. Aortic atherosclerosis, borderline dilated aorta to 39 mm a the mid-ascending point (level of the PA bifurcation).   5. Borderline dilated main pulmonary artery at 29 mm, suggestive of possible pulmonary hypertension.  Echo 01/07/2021:  1. Left ventricular ejection fraction, by estimation, is 50%. The left  ventricle has mildly decreased function. The left ventricle demonstrates  global hypokinesis. Left ventricular diastolic parameters are consistent  with Grade I diastolic dysfunction  (impaired relaxation).   2. Right ventricular systolic function is normal. The right ventricular  size is normal. Tricuspid regurgitation signal is inadequate for assessing  PA pressure.   3. Left atrial size was mildly dilated.   4. Right atrial size was mildly dilated.   5. The mitral valve is normal in structure. No evidence of mitral valve  regurgitation. No evidence of mitral stenosis.   6. The aortic valve is tricuspid. Aortic valve regurgitation is not  visualized. No aortic stenosis is present.   7. Aortic dilatation noted. There is moderate dilatation of the aortic  root, measuring 45 mm.   8. The inferior vena cava is normal in size with greater than 50%  respiratory variability, suggesting right atrial pressure of 3 mmHg.    EKG:  EKG is personally reviewed. 08/26/2022:  Atrial flutter with variable conduction. Rate 88  bpm. LAD. 06/14/2021: EKG was not ordered.  Recent Labs: 04/03/2022: TSH 1.36 04/06/2022: BUN 19; Creatinine, Ser 1.34; Hemoglobin 13.5; Platelets 198; Potassium 4.3; Sodium 140 08/20/2022: ALT 20   Recent Lipid Panel    Component Value Date/Time   CHOL 132 08/20/2022 0827   TRIG 96 08/20/2022 0827   HDL 50 08/20/2022 0827   CHOLHDL 2.6 08/20/2022 0827   CHOLHDL 3.7 06/15/2020 1602   VLDL 22.6 02/10/2019 1640   LDLCALC 64 08/20/2022 0827   LDLCALC 109 (H) 06/15/2020 1602   LDLDIRECT 132.9 09/23/2011 1557     Risk Assessment/Calculations:    This patients CHA2DS2-VASc Score  and unadjusted Ischemic Stroke Rate (% per year) is equal to 2.2 % stroke rate/year from a score of 2  Above score calculated as 1 point each if present [CHF, HTN, DM, Vascular=MI/PAD/Aortic Plaque, Age if 65-74, or Male] Above score calculated as 2 points each if present [Age > 75, or Stroke/TIA/TE]   STOP-Bang Score:  4       Physical Exam:    VS:  BP 112/82 (BP Location: Right Arm, Patient Position: Sitting, Cuff Size: Large)   Pulse 88   Ht 5\' 10"  (1.778 m)   Wt 241 lb (109.3 kg)   BMI 34.58 kg/m  , BMI Body mass index is 34.58 kg/m. GENERAL:  Well appearing HEENT: Pupils equal round and reactive, fundi not visualized, oral mucosa unremarkable NECK:  No jugular venous distention, waveform within normal limits, carotid upstroke brisk and symmetric, no bruits, no thyromegaly LUNGS:  Clear to auscultation bilaterally HEART:  Irregularly irregular.  PMI not displaced or sustained,S1 and S2 within normal limits, no S3, no S4, no clicks, no rubs, no murmurs ABD:  Flat, positive bowel sounds normal in frequency in pitch, no bruits, no rebound, no guarding, no midline pulsatile mass, no hepatomegaly, no splenomegaly EXT:  2 plus pulses throughout, no edema, no cyanosis no clubbing SKIN:  No rashes no nodules NEURO:  Cranial nerves II through XII grossly intact, motor grossly intact throughout PSYCH:   Cognitively intact, oriented to person place and time  ASSESSMENT:    1. Snoring   2. Atrial flutter, unspecified type ([place])   3. Chronic combined systolic and diastolic heart failure ([place])   4. Primary hypertension   5. Other hyperlipidemia   6. Aortic atherosclerosis ([place])   7. Daytime somnolence     PLAN:    Atrial flutter ([place]) Patient is in recurrent atrial flutter.  He has been feeling tired.  He also has chest pressure.  We will plan for TEE/DCCV on 10/5.  Start Eliquis 5mg  bid.  Rate is well-controlled on metoprolol.  He plans to get a Kardia mobile device.  We did discuss the fact that now that he is having recurrent episodes we may need to consider antiarrhythmics or ablation in the future.  Reports snoring and daytime somnolence.  We will also get a sleep study.  Shared Decision Making/Informed Consent The risks [stroke, cardiac arrhythmias rarely resulting in the need for a temporary or permanent pacemaker, skin irritation or burns, esophageal damage, perforation (1:10,000 risk), bleeding, pharyngeal hematoma as well as other potential complications associated with conscious sedation including aspiration, arrhythmia, respiratory failure and death], benefits (treatment guidance, restoration of normal sinus rhythm, diagnostic support) and alternatives of a transesophageal echocardiogram guided cardioversion were discussed in detail with Mr. Stork and he is willing to proceed.      Chronic combined systolic and diastolic heart failure ([place]) He is currently euvolemic.  LVEF did get reduced in the past when he had atrial flutter.  We will see what his LVEF looks like on TEE.  If reduced we will need to repeat an echo in a few months to make sure that it improves.  HTN (hypertension) Blood pressures well controlled both here and at home.  Continue metoprolol.  Encouraged him to work on increasing his exercise.  Hyperlipidemia Lipids are very well controlled on rosuvastatin.   Continue current dose.  Aortic atherosclerosis (HCC) Lipid controlled.  Switching aspirin to Eliquis as above.    Disposition: FU with [name]. Oval Linsey, MD, [facility] in 1 month.  Medication Adjustments/Labs and Tests Ordered: Current medicines are reviewed at length with the patient today.  Concerns regarding medicines are outlined above.   Orders Placed This Encounter  Procedures   CBC with Differential/Platelet   Basic metabolic panel   EKG [id number]   [facility] Sleep Study   Meds ordered this encounter  Medications   apixaban (ELIQUIS) 5 MG TABS tablet    Sig: Take 1 tablet (5 mg total) by mouth 2 (two) times daily.    Dispense:  180 tablet    Refill:  1    I,[name],acting as a scribe for Joseph Latch, MD.,have documented all relevant documentation on the behalf of Joseph Latch, MD,as directed by  Joseph Latch, MD while in the presence of Joseph Latch, MD.  I, Joseph Oval Linsey, MD have reviewed all documentation for this visit.  The documentation of the exam, diagnosis, procedures, and orders on 08/26/2022 are all accurate and complete.  Signed, Joseph Latch, MD  08/26/2022 1:06 PM    [facility]

## 2022-08-26 NOTE — Progress Notes (Signed)
Cardiology Office Note:    Date:  08/26/2022   ID:  [name], DOB 1951/07/12, MRN [id number]  PCP:  Biagio Borg, MD   [place] Prov[facility] Cardiologist:  Skeet Latch, MD     Referring MD: Biagio Borg, MD   No chief complaint on file.   History of Present Illness:    [name] is a 71 y.o. male with a hx of aortic atherosclerosis, ascending aortic aneurysm, paroxysmal atrial fibrillation, chronic systolic diastolic heart failure (recovered LVEF), hyperlipidemia, hypertension, obesity, and GERD, here for follow-up. He was first seen in the Atrial fibrillation clinic 12/2020 for atrial flutter. He was started on eliquis and metoprolol. He had a TEE/DCCV 12/2020. The echo revealed LVEF less then 20% with global hypokinesis and a secundum ASD. He followed up with Beckie Busing, DNP and was referred for repeat Echo, where his LVEF had improved to 50%.  At the last visit he reported exertional dyspnea, despite being in sinus rhythm. He had a coronary CTA 05/2021 that showed minimal non-calcified plaque. His calcium score was 0. He had aortic atherosclerosis, and his aorta was 3.9 cm. He had an Echo  01/2022 which revealed LVEF 55-60%, mild LVH, and grade 1 diastolic dysfunction. The aortic root was 4.6 cm, and the ascending aorta was 4.4 cm. He saw Laurann Montana, NP 01/2022 and was feeling well.   Today, he states he has started checking his blood pressure more frequently in the past few weeks. He is concerned that his heart rate seems to be low as he has readings such as 46, 53, and 58 bpm. Additionally he complains of a central chest soreness with onset 2 weeks ago and tenderness to palpation. He describes his chest soreness as "being punched in the chest." He struggles with becoming fatigued easily, which has been ongoing for several years. Of note, he plans to retire soon. 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 With activity he  generally feels fatigued with no anginal symptoms. In 2019 during a right total knee arthroplasty he developed a thrombus in his calf and his blood thinner was discontinued. He denies any significant smoking history. He denies any palpitations, shortness of breath, or peripheral edema. No lightheadedness, headaches, syncope, orthopnea, or PND.   Past Medical History:  Diagnosis Date   ALLERGIC RHINITIS 12/31/2007   Aortic atherosclerosis ([place]) 08/26/2022   Arthritis    Chronic combined systolic and diastolic heart failure ([place]) 06/14/2021   COUGH, CHRONIC 07/18/2007   DEGENERATIVE JOINT DISEASE 07/18/2007   Extrinsic asthma, unspecified 09/08/2009   GERD 07/18/2007   HYPERLIPIDEMIA 08/14/2010   Hypertension    Impaired glucose tolerance 09/23/2011   OBESITY 07/18/2007   Pneumonia     Past Surgical History:  Procedure Laterality Date   CARDIOVERSION N/A 12/28/2020   Procedure: CARDIOVERSION;  Surgeon: Skeet Latch, MD;  Location: [facility];  Service: Cardiovascular;  Laterality: N/A;   DG FEMUR LEFT  ([place] [facility]X)     rod placed   LUMBAR [place]     TEE WITHOUT CARDIOVERSION N/A 12/28/2020   Procedure: TRANSESOPHAGEAL ECHOCARDIOGRAM (TEE);  Surgeon: Skeet Latch, MD;  Location: [facility];  Service: Cardiovascular;  Laterality: N/A;   TOOTH EXTRACTION     TOTAL KNEE ARTHROPLASTY Right 07/26/2018   Procedure: RIGHT TOTAL KNEE ARTHROPLASTY;  Surgeon: Jessy Oto, MD;  Location: [facility];  Service: Orthopedics;  Laterality: Right;    Current Medications: Current Meds  Medication Sig  Acetaminophen (TYLENOL PO) Take by mouth daily.   albuterol (PROAIR HFA) 108 (90 Base) MCG/ACT inhaler Inhale 2 puffs into the lungs every 6 (six) hours as needed for wheezing or shortness of breath.   apixaban (ELIQUIS) 5 MG TABS tablet Take 1 tablet (5 mg total) by mouth 2 (two) times daily.   budesonide-formoterol (SYMBICORT) 80-4.5 MCG/ACT inhaler Inhale 2 puffs into the lungs 2  (two) times daily.   diclofenac Sodium (VOLTAREN) 1 % GEL Apply 2 g topically daily as needed (pain).   esomeprazole (NEXIUM) 40 MG capsule Take 1 capsule (40 mg total) by mouth daily at 12 noon. Take 1 Daily (Patient taking differently: Take 40 mg by mouth as needed.)   gabapentin (NEURONTIN) 300 MG capsule Take 1 capsule (300 mg total) by mouth at bedtime. 1 capsule at bedtime   metoprolol succinate (TOPROL-XL) 25 MG 24 hr tablet Take 1 tablet (25 mg total) by mouth daily.   Multiple Vitamins-Minerals (CENTRUM ADULTS PO) Take by mouth.   naproxen (NAPROSYN) 500 MG tablet TAKE 1 TABLET TWICE DAILY  WITH MEALS   rosuvastatin (CRESTOR) 20 MG tablet Take 1 tablet (20 mg total) by mouth daily.   [DISCONTINUED] aspirin EC 81 MG tablet Take 81 mg by mouth daily. Swallow whole.     Allergies:   Patient has no known allergies.   Social History   Socioeconomic History   Marital status: Married    Spouse name: Not on file   Number of children: 1   Years of education: Not on file   Highest education level: Not on file  Occupational History   Occupation: truck driver  Tobacco Use   Smoking status: Never   Smokeless tobacco: Never   Tobacco comments:    Marijuana 30 years  Vaping Use   Vaping Use: Never used  Substance and Sexual Activity   Alcohol use: Yes    Alcohol/week: 2.0 - 4.0 standard drinks of alcohol    Types: 2 - 4 Cans of beer per week    Comment: moderate   Drug use: Not Currently    Types: Marijuana    Comment: as a teenager   Sexual activity: Not on file  Other Topics Concern   Not on file  Social History Narrative   Not on file   Social Determinants of Health   Financial Resource Strain: Not on file  Food Insecurity: Not on file  Transportation Needs: Not on file  Physical Activity: Not on file  Stress: Not on file  Social Connections: Not on file     Family History: The patient's family history includes Cerebral palsy in his sister; Diabetes in his maternal  grandmother; Lupus in his father.  ROS:   Please see the history of present illness.    (+) Fatigue (+) Chest soreness All other systems reviewed and are negative.  EKGs/Labs/Other Studies Reviewed:    The following studies were reviewed today:  Echo 01/03/2022:  1. Left ventricular ejection fraction, by estimation, is 55 to 60%. The  left ventricle has normal function. The left ventricle has no regional  wall motion abnormalities. There is mild concentric left ventricular  hypertrophy of the septal segment. Left  ventricular diastolic parameters are consistent with Grade I diastolic  dysfunction (impaired relaxation). The average left ventricular global  longitudinal strain is 19.8 %. The global longitudinal strain is normal.   2. Right ventricular systolic function is normal. The right ventricular  size is normal. There is normal pulmonary artery systolic  pressure.   3. Left atrial size was mildly dilated.   4. Right atrial size was mildly dilated.   5. The mitral valve is normal in structure. No evidence of mitral valve  regurgitation. No evidence of mitral stenosis.   6. The aortic valve is tricuspid. Aortic valve regurgitation is trivial.  No aortic stenosis is present.   7. Aortic dilatation noted. There is moderate dilatation of the aortic  root, measuring 46 mm. There is moderate dilatation of the ascending  aorta, measuring 44 mm.   8. The inferior vena cava is normal in size with greater than 50%  respiratory variability, suggesting right atrial pressure of 3 mmHg.   CT Chest  09/17/2021: IMPRESSION: 1. Stable small bilateral pulmonary nodules from baseline chest CT of 9 months ago, supporting a benign etiology. This appearance is almost certainly benign, and no dedicated follow-up is required if this patient is low risk for bronchogenic carcinoma (and has no known or suspected primary neoplasm). Non-contrast chest CT can be considered in 12 months if patient is  high-risk. This recommendation follows the consensus statement: Guidelines for Management of Incidental Pulmonary Nodules Detected on CT Images: From the Fleischner Society 2017; Radiology 2017; 284:228-243. 2. Mild Aortic Atherosclerosis (ICD10-I70.0) and Emphysema (ICD10-J43.9).  Coronary CTA  06/28/2021: IMPRESSION: 1. Significant motion artifact, but did not impair interpretation. Minimal non-calcified CAD, CADRADS = 1.   2. Coronary calcium score of 0. This was 0 percentile for age and sex matched control.   3. Normal coronary origin with right dominance.   4. Aortic atherosclerosis, borderline dilated aorta to 39 mm a the mid-ascending point (level of the PA bifurcation).   5. Borderline dilated main pulmonary artery at 29 mm, suggestive of possible pulmonary hypertension.  Echo 01/07/2021:  1. Left ventricular ejection fraction, by estimation, is 50%. The left  ventricle has mildly decreased function. The left ventricle demonstrates  global hypokinesis. Left ventricular diastolic parameters are consistent  with Grade I diastolic dysfunction  (impaired relaxation).   2. Right ventricular systolic function is normal. The right ventricular  size is normal. Tricuspid regurgitation signal is inadequate for assessing  PA pressure.   3. Left atrial size was mildly dilated.   4. Right atrial size was mildly dilated.   5. The mitral valve is normal in structure. No evidence of mitral valve  regurgitation. No evidence of mitral stenosis.   6. The aortic valve is tricuspid. Aortic valve regurgitation is not  visualized. No aortic stenosis is present.   7. Aortic dilatation noted. There is moderate dilatation of the aortic  root, measuring 45 mm.   8. The inferior vena cava is normal in size with greater than 50%  respiratory variability, suggesting right atrial pressure of 3 mmHg.    EKG:  EKG is personally reviewed. 08/26/2022:  Atrial flutter with variable conduction. Rate 88  bpm. LAD. 06/14/2021: EKG was not ordered.  Recent Labs: 04/03/2022: TSH 1.36 04/06/2022: BUN 19; Creatinine, Ser 1.34; Hemoglobin 13.5; Platelets 198; Potassium 4.3; Sodium 140 08/20/2022: ALT 20   Recent Lipid Panel    Component Value Date/Time   CHOL 132 08/20/2022 0827   TRIG 96 08/20/2022 0827   HDL 50 08/20/2022 0827   CHOLHDL 2.6 08/20/2022 0827   CHOLHDL 3.7 06/15/2020 1602   VLDL 22.6 02/10/2019 1640   LDLCALC 64 08/20/2022 0827   LDLCALC 109 (H) 06/15/2020 1602   LDLDIRECT 132.9 09/23/2011 1557     Risk Assessment/Calculations:    This patients CHA2DS2-VASc Score  and unadjusted Ischemic Stroke Rate (% per year) is equal to 2.2 % stroke rate/year from a score of 2  Above score calculated as 1 point each if present [CHF, HTN, DM, Vascular=MI/PAD/Aortic Plaque, Age if 65-74, or Male] Above score calculated as 2 points each if present [Age > 75, or Stroke/TIA/TE]   STOP-Bang Score:  4       Physical Exam:    VS:  BP 112/82 (BP Location: Right Arm, Patient Position: Sitting, Cuff Size: Large)   Pulse 88   Ht 5\' 10"  (1.778 m)   Wt 241 lb (109.3 kg)   BMI 34.58 kg/m  , BMI Body mass index is 34.58 kg/m. GENERAL:  Well appearing HEENT: Pupils equal round and reactive, fundi not visualized, oral mucosa unremarkable NECK:  No jugular venous distention, waveform within normal limits, carotid upstroke brisk and symmetric, no bruits, no thyromegaly LUNGS:  Clear to auscultation bilaterally HEART:  Irregularly irregular.  PMI not displaced or sustained,S1 and S2 within normal limits, no S3, no S4, no clicks, no rubs, no murmurs ABD:  Flat, positive bowel sounds normal in frequency in pitch, no bruits, no rebound, no guarding, no midline pulsatile mass, no hepatomegaly, no splenomegaly EXT:  2 plus pulses throughout, no edema, no cyanosis no clubbing SKIN:  No rashes no nodules NEURO:  Cranial nerves II through XII grossly intact, motor grossly intact throughout PSYCH:   Cognitively intact, oriented to person place and time  ASSESSMENT:    1. Snoring   2. Atrial flutter, unspecified type ([place])   3. Chronic combined systolic and diastolic heart failure ([place])   4. Primary hypertension   5. Other hyperlipidemia   6. Aortic atherosclerosis ([place])   7. Daytime somnolence     PLAN:    Atrial flutter ([place]) Patient is in recurrent atrial flutter.  He has been feeling tired.  He also has chest pressure.  We will plan for TEE/DCCV on 10/5.  Start Eliquis 5mg  bid.  Rate is well-controlled on metoprolol.  He plans to get a Kardia mobile device.  We did discuss the fact that now that he is having recurrent episodes we may need to consider antiarrhythmics or ablation in the future.  Reports snoring and daytime somnolence.  We will also get a sleep study.  Shared Decision Making/Informed Consent The risks [stroke, cardiac arrhythmias rarely resulting in the need for a temporary or permanent pacemaker, skin irritation or burns, esophageal damage, perforation (1:10,000 risk), bleeding, pharyngeal hematoma as well as other potential complications associated with conscious sedation including aspiration, arrhythmia, respiratory failure and death], benefits (treatment guidance, restoration of normal sinus rhythm, diagnostic support) and alternatives of a transesophageal echocardiogram guided cardioversion were discussed in detail with [name] and he is willing to proceed.      Chronic combined systolic and diastolic heart failure ([place]) He is currently euvolemic.  LVEF did get reduced in the past when he had atrial flutter.  We will see what his LVEF looks like on TEE.  If reduced we will need to repeat an echo in a few months to make sure that it improves.  HTN (hypertension) Blood pressures well controlled both here and at home.  Continue metoprolol.  Encouraged him to work on increasing his exercise.  Hyperlipidemia Lipids are very well controlled on rosuvastatin.   Continue current dose.  Aortic atherosclerosis ([facility]) Lipid controlled.  Switching aspirin to Eliquis as above.    Disposition: FU with Joseph Alfred C. Oval Linsey, MD, [facility] in 1 month.  Medication Adjustments/Labs and Tests Ordered: Current medicines are reviewed at length with the patient today.  Concerns regarding medicines are outlined above.   Orders Placed This Encounter  Procedures   CBC with Differential/Platelet   Basic metabolic panel   EKG [id number]   [facility] Sleep Study   Meds ordered this encounter  Medications   apixaban (ELIQUIS) 5 MG TABS tablet    Sig: Take 1 tablet (5 mg total) by mouth 2 (two) times daily.    Dispense:  180 tablet    Refill:  1    I,Mathew Stumpf,acting as a scribe for Skeet Latch, MD.,have documented all relevant documentation on the behalf of Skeet Latch, MD,as directed by  Skeet Latch, MD while in the presence of Skeet Latch, MD.  I, Bud Oval Linsey, MD have reviewed all documentation for this visit.  The documentation of the exam, diagnosis, procedures, and orders on 08/26/2022 are all accurate and complete.  Signed, Skeet Latch, MD  08/26/2022 1:06 PM    [facility]

## 2022-08-26 NOTE — Assessment & Plan Note (Signed)
He is currently euvolemic.  LVEF did get reduced in the past when he had atrial flutter.  We will see what his LVEF looks like on TEE.  If reduced we will need to repeat an echo in a few months to make sure that it improves.

## 2022-08-26 NOTE — Patient Instructions (Addendum)
Medication Instructions:  STOP ASPIRIN   START ELIQUIS 5 MG TWCE A DAY   WILL DISCUSS NAPROXEN WITH DR [place] AND CALL YOU   *If you need a refill on your cardiac medications before your next appointment, please call your pharmacy*  Lab Work: CBC/BMET TUESDAY OR [facility]   If you have labs (blood work) drawn today and your tests are completely normal, you will receive your results only by: MyChart Message (if you have MyChart) OR A paper copy in the mail If you have any lab test that is abnormal or we need to change your treatment, we will call you to review the results.  Testing/Procedures: Donnie Coffin SLEEP STUDY   TEE/CARDIOVERSION 10/5  Follow-Up: At [facility], you and your health needs are our priority.  As part of our continuing mission to provide you with exceptional heart care, we have created designated Provider Care Teams.  These Care Teams include your primary Cardiologist (physician) and Advanced Practice Providers (APPs -  Physician Assistants and Nurse Practitioners) who all work together to provide you with the care you need, when you need it.  We recommend signing up for the patient portal called "MyChart".  Sign up information is provided on this After Visit Summary.  MyChart is used to connect with patients for Virtual Visits (Telemedicine).  Patients are able to view lab/test results, encounter notes, upcoming appointments, etc.  Non-urgent messages can be sent to your provider as well.   To learn more about what you can do with MyChart, go to [email or web address].    Your next appointment:   1 month(s)  The format for your next appointment:   In Person  Provider:   You will follow up in the Atrial Fibrillation Clinic located at [facility]. Your provider will be: Rudi Coco, NP or Clint R. Fenton, PA-C  OR CAITLIN W NP AT [facility] LOCATION   Your physician recommends that you schedule a follow-up appointment in: 6 MONTHS WITH DR  [facility]   Other Instructions  You are scheduled for a TEE/Cardioversion on 09/04/2022 with Dr. Rennis Golden.  Please arrive at the [facility] (Main Entrance A) at [facility]: [address] [place], [place] [address] at 9:30 AM. (1 hour prior to procedure unless lab work is needed; if lab work is needed arrive 1.5 hours ahead)  DIET: Nothing to eat or drink after midnight except a sip of water with medications (see medication instructions below)  FYI: For your safety, and to allow [place] to monitor your vital signs accurately during the surgery/procedure we request that   if you have artificial nails, gel coating, SNS etc. Please have those removed prior to your surgery/procedure. Not having the nail coverings /polish removed may result in cancellation or delay of your surgery/procedure.   Medication Instructions:  Continue your anticoagulant: ELIQUIS You will need to continue your anticoagulant after your procedure until you are told by your  Provider that it is safe to stop   Labs:NEXT WEEK PRIOR TO CARDIOVERSION   Come to: [facility] OR [address] MONDAY-FRIDAY 8:00-4:30 WITH LUNCH 12-1  You must have a responsible person to drive you home and stay in the waiting area during your procedure. Failure to do so could result in cancellation.  Bring your insurance cards.  *Special Note: Every effort is made to have your procedure done on time. Occasionally there are emergencies that occur at the hospital that may cause delays. Please be patient if a delay does occur.  WatchPAT?  Is a FDA cleared portable home sleep study test that uses a watch and 3 points of contact to monitor 7 different channels, including your heart rate, oxygen saturations, body position, snoring, and chest motion.  The study is easy to use from the comfort of your own home and accurately detect sleep apnea.  Before bed, you attach the chest sensor, attached the sleep apnea bracelet to your nondominant hand,  and attach the finger probe.  After the study, the raw data is downloaded from the watch and scored for apnea events.   For more information: [email or web address]  Patient Testing Instructions:  Do not put battery into the device until bedtime when you are ready to begin the test. Please call the support number if you need assistance after following the instructions below: 24 hour support line- [phone] or [facility] support at [phone] (option 2)  Download the [employer] AmericanWatchPAT One" app through the google play store or App Store  Be sure to turn on or enable access to bluetooth in [place] on your smartphone/ device  Make sure no other bluetooth devices are on and within the vicinity of your smartphone/ device and WatchPAT watch during testing.  Make sure to leave your smart phone/ device plugged in and charging all night.  When ready for bed:  Follow the instructions step by step in the WatchPAT One App to activate the testing device. For additional instructions, including video instruction, visit the WatchPAT One video on Youtube. You can search for [place] One within Youtube (video is 4 minutes and 18 seconds) or enter: https://youtube/watch?v=BCce_vbiwxE Please note: You will be prompted to enter a Pin to connect via bluetooth when starting the test. The PIN will be assigned to you when you receive the test.  The device is disposable, but it recommended that you retain the device until you receive a call letting you know the study has been received and the results have been interpreted.  We will let you know if the study did not transmit to [place] properly after the test is completed. You do not need to call us to confirm the receipt of the test.  Please complete the test within 48 hours of receiving PIN.   Frequently Asked Questions:  What is Watch [place] one?  A single use fully disposable home sleep apnea testing device and will not need to be returned after completion.   What are the requirements to use WatchPAT one?  The be able to have a successful watchpat one sleep study, you should have your Watch pat one device, your smart phone, watch pat one app, your PIN number and Internet access What type of phone do I need?  You should have a smart phone that uses Android 5.1 and above or any Iphone with IOS 10 and above How can I download the WatchPAT one app?  Based on your device type search for WatchPAT one app either in google play for android devices or APP store for Iphone's Where will I get my PIN for the study?  Your PIN will be provided by your physician's office. It is used for authentication and if you lose/forget your PIN, please reach out to your providers office.  I do not have Internet at home. Can I do WatchPAT one study?  WatchPAT One needs Internet connection throughout the night to be able to transmit the sleep data. You can use your home/local internet or your cellular's data package. However, it is always recommended to use home/local Internet.  It is estimated that between 20MB-30MB will be used with each study.However, the application will be looking for space in the phone to start the study.  What happens if I lose internet or bluetooth connection?  During the internet disconnection, your phone will not be able to transmit the sleep data. All the data, will be stored in your phone. As soon as the internet connection is back on, the phone will being sending the sleep data. During the bluetooth disconnection, WatchPAT one will not be able to to send the sleep data to your phone. Data will be kept in the [facility] one until two devices have bluetooth connection back on. As soon as the connection is back on, WatchPAT one will send the sleep data to the phone.  How long do I need to wear the WatchPAT one?  After you start the study, you should wear the device at least 6 hours.  How far should I keep my phone from the device?  During the night,  your phone should be within 15 feet.  What happens if I leave the room for restroom or other reasons?  Leaving the room for any reason will not cause any problem. As soon as your get back to the room, both devices will reconnect and will continue to send the sleep data. Can I use my phone during the sleep study?  Yes, you can use your phone as usual during the study. But it is recommended to put your watchpat one on when you are ready to go to bed.  How will I get my study results?  A soon as you completed your study, your sleep data will be sent to the provider. They will then share the results with you when they are ready.

## 2022-08-26 NOTE — Assessment & Plan Note (Signed)
Lipid controlled.  Switching aspirin to Eliquis as above.

## 2022-08-26 NOTE — Assessment & Plan Note (Addendum)
Patient is in recurrent atrial flutter.  He has been feeling tired.  He also has chest pressure.  We will plan for TEE/DCCV on 10/5.  Start Eliquis 5mg  bid.  Rate is well-controlled on metoprolol.  He plans to get a Kardia mobile device.  We did discuss the fact that now that he is having recurrent episodes we may need to consider antiarrhythmics or ablation in the future.  Reports snoring and daytime somnolence.  We will also get a sleep study.  Shared Decision Making/Informed Consent The risks [stroke, cardiac arrhythmias rarely resulting in the need for a temporary or permanent pacemaker, skin irritation or burns, esophageal damage, perforation (1:10,000 risk), bleeding, pharyngeal hematoma as well as other potential complications associated with conscious sedation including aspiration, arrhythmia, respiratory failure and death], benefits (treatment guidance, restoration of normal sinus rhythm, diagnostic support) and alternatives of a transesophageal echocardiogram guided cardioversion were discussed in detail with [name] and he is willing to proceed.

## 2022-08-27 ENCOUNTER — Telehealth (HOSPITAL_BASED_OUTPATIENT_CLINIC_OR_DEPARTMENT_OTHER): Payer: Self-pay | Admitting: *Deleted

## 2022-08-27 NOTE — Telephone Encounter (Signed)
Eliquis 5 mg #1 lot ACE [id number] exp 03/2024 given at visit 9/26

## 2022-08-28 ENCOUNTER — Other Ambulatory Visit: Payer: Self-pay | Admitting: Radiology

## 2022-08-29 ENCOUNTER — Ambulatory Visit: Payer: BC Managed Care – PPO | Attending: Cardiovascular Disease

## 2022-08-29 DIAGNOSIS — I10 Essential (primary) hypertension: Secondary | ICD-10-CM

## 2022-08-29 DIAGNOSIS — I4892 Unspecified atrial flutter: Secondary | ICD-10-CM

## 2022-08-29 DIAGNOSIS — R400 Somnolence: Secondary | ICD-10-CM

## 2022-08-29 DIAGNOSIS — R0683 Snoring: Secondary | ICD-10-CM

## 2022-08-31 NOTE — Procedures (Signed)
   Patient Information Study Date: 08/30/22 Patient Name: [name] Birth Date: 2050-12-02 Age: 71 Gender: Male BMI: 34.4 (W=240 lb, H=5' 10'') Referring Physician: Skeet Latch, MD  TEST DESCRIPTION: Home sleep apnea testing was completed using the WatchPat, a Type 1 device, utilizing peripheral arterial tonometry (PAT), chest movement, actigraphy, pulse oximetry, pulse rate, body position and snore. AHI was calculated with apnea and hypopnea using valid sleep time as the denominator. RDI includes apneas, hypopneas, and RERAs. The data acquired and the scoring of sleep and all associated events were performed in accordance with the recommended standards and specifications as outlined in the AASM Manual for the Scoring of Sleep and Associated Events 2.2.0 (2015).   FINDINGS: 1.  No evidence of Obstructive Sleep Apnea with AHI 4.8/hr.  2.  No Central Sleep Apnea. 3.  Oxygen desaturations as low as 86%. 4.  Mild snoring was present. O2 sats were < 88% for 0.1 minutes. 5.  Total sleep time was 6 hrs and 49 min. 6.  23.7% of total sleep time was spent in REM sleep.  7.  Normal sleep onset latency at 19 min.  8.  Prolonged REM sleep onset latency at 187 min.  9.  Total awakenings were 13.    DIAGNOSIS:  Normal study with no significant sleep disordered breathing.  RECOMMENDATIONS:   1. Normal study with no significant sleep disordered breathing.  2.  Healthy sleep recommendations include:  adequate nightly sleep (normal 7-9 hrs/night), avoidance of caffeine after noon and alcohol near bedtime, and maintaining a sleep environment that is cool, dark and quiet.  3.  Weight loss for overweight patients is recommended.    4.  Snoring recommendations include:  weight loss where appropriate, side sleeping, and avoidance of alcohol before bed.  5.  Operation of motor vehicle or dangerous equipment must be avoided when feeling drowsy, excessively sleepy, or mentally fatigued.    6.   An ENT consultation which may be useful for specific causes of and possible treatment of bothersome snoring.   7. Weight loss may be of benefit in reducing the severity of snoring.   Signature: Fransico Him, MD; [facility]; [place], [place] Board of Sleep Medicine Electronically Signed: 09/01/22

## 2022-09-01 DIAGNOSIS — I4892 Unspecified atrial flutter: Secondary | ICD-10-CM | POA: Diagnosis not present

## 2022-09-01 SURGERY — Surgical Case
Anesthesia: *Unknown

## 2022-09-01 MED ORDER — GABAPENTIN 300 MG PO CAPS: 300.0000 mg | ORAL_CAPSULE | Freq: Every day | ORAL | 3 refills | Status: DC

## 2022-09-02 LAB — BASIC METABOLIC PANEL
BUN/Creatinine Ratio: 14 (ref 10–24)
BUN: 17 mg/dL (ref 8–27)
CO2: 22 mmol/L (ref 20–29)
Calcium: 9.5 mg/dL (ref 8.6–10.2)
Chloride: 106 mmol/L (ref 96–106)
Creatinine, Ser: 1.24 mg/dL (ref 0.76–1.27)
Glucose: 90 mg/dL (ref 70–99)
Potassium: 4.6 mmol/L (ref 3.5–5.2)
Sodium: 143 mmol/L (ref 134–144)
eGFR: 63 mL/min/{1.73_m2} (ref >59)

## 2022-09-02 LAB — CBC WITH DIFFERENTIAL/PLATELET
Basophils Absolute: 0 10*3/uL (ref 0.0–0.2)
Basos: 1 %
EOS (ABSOLUTE): 0.1 10*3/uL (ref 0.0–0.4)
Eos: 2 %
Hematocrit: 45.9 % (ref 37.5–51.0)
Hemoglobin: 14.9 g/dL (ref 13.0–17.7)
Immature Grans (Abs): 0 10*3/uL (ref 0.0–0.1)
Immature Granulocytes: 1 %
Lymphocytes Absolute: 1.8 10*3/uL (ref 0.7–3.1)
Lymphs: 31 %
MCH: 30 pg (ref 26.6–33.0)
MCHC: 32.5 g/dL (ref 31.5–35.7)
MCV: 93 fL (ref 79–97)
Monocytes Absolute: 0.7 10*3/uL (ref 0.1–0.9)
Monocytes: 12 %
Neutrophils Absolute: 3.2 10*3/uL (ref 1.4–7.0)
Neutrophils: 53 %
Platelets: 195 10*3/uL (ref 150–450)
RBC: 4.96 x10E6/uL (ref 4.14–5.80)
RDW: 12.2 % (ref 11.6–15.4)
WBC: 6 10*3/uL (ref 3.4–10.8)

## 2022-09-03 ENCOUNTER — Ambulatory Visit: Payer: BC Managed Care – PPO | Admitting: Physical Medicine and Rehabilitation

## 2022-09-03 ENCOUNTER — Ambulatory Visit: Payer: Self-pay

## 2022-09-03 DIAGNOSIS — M25511 Pain in right shoulder: Secondary | ICD-10-CM

## 2022-09-03 DIAGNOSIS — G8929 Other chronic pain: Secondary | ICD-10-CM | POA: Diagnosis not present

## 2022-09-03 NOTE — Progress Notes (Signed)
Numeric Pain Rating Scale and Functional Assessment Average Pain 0   In the last MONTH (on 0-10 scale) has pain interfered with the following?  1. General activity like being  able to carry out your everyday physical activities such as walking, climbing stairs, carrying groceries, or moving a chair?  Rating(8)   +Driver, -BT, -Dye Allergies.   *Pain with ADLs

## 2022-09-04 ENCOUNTER — Telehealth: Payer: Self-pay | Admitting: *Deleted

## 2022-09-04 ENCOUNTER — Encounter (HOSPITAL_COMMUNITY)
Admission: RE | Disposition: A | Discharge status: Home / Self Care | Payer: Self-pay | Source: Home / Self Care | Attending: Cardiology

## 2022-09-04 ENCOUNTER — Ambulatory Visit (HOSPITAL_COMMUNITY): Payer: BC Managed Care – PPO | Admitting: Anesthesiology

## 2022-09-04 ENCOUNTER — Ambulatory Visit (HOSPITAL_COMMUNITY)
Admission: RE | Admit: 2022-09-04 | Discharge: 2022-09-04 | Disposition: A | Discharge status: Home / Self Care | Payer: BC Managed Care – PPO | Attending: Cardiology | Admitting: Cardiology

## 2022-09-04 ENCOUNTER — Ambulatory Visit (HOSPITAL_BASED_OUTPATIENT_CLINIC_OR_DEPARTMENT_OTHER): Payer: BC Managed Care – PPO

## 2022-09-04 DIAGNOSIS — Z79899 Other long term (current) drug therapy: Secondary | ICD-10-CM | POA: Insufficient documentation

## 2022-09-04 DIAGNOSIS — Z6834 Body mass index (BMI) 34.0-34.9, adult: Secondary | ICD-10-CM | POA: Insufficient documentation

## 2022-09-04 DIAGNOSIS — I110 Hypertensive heart disease with heart failure: Secondary | ICD-10-CM | POA: Insufficient documentation

## 2022-09-04 DIAGNOSIS — I480 Paroxysmal atrial fibrillation: Secondary | ICD-10-CM | POA: Insufficient documentation

## 2022-09-04 DIAGNOSIS — I700 Atherosclerosis of aorta: Secondary | ICD-10-CM | POA: Diagnosis not present

## 2022-09-04 DIAGNOSIS — K219 Gastro-esophageal reflux disease without esophagitis: Secondary | ICD-10-CM | POA: Diagnosis not present

## 2022-09-04 DIAGNOSIS — I77819 Aortic ectasia, unspecified site: Secondary | ICD-10-CM | POA: Diagnosis not present

## 2022-09-04 DIAGNOSIS — I4892 Unspecified atrial flutter: Secondary | ICD-10-CM | POA: Diagnosis not present

## 2022-09-04 DIAGNOSIS — I483 Typical atrial flutter: Secondary | ICD-10-CM

## 2022-09-04 DIAGNOSIS — E669 Obesity, unspecified: Secondary | ICD-10-CM | POA: Insufficient documentation

## 2022-09-04 DIAGNOSIS — Q2112 Patent foramen ovale: Secondary | ICD-10-CM | POA: Insufficient documentation

## 2022-09-04 DIAGNOSIS — Z7901 Long term (current) use of anticoagulants: Secondary | ICD-10-CM | POA: Diagnosis not present

## 2022-09-04 DIAGNOSIS — I081 Rheumatic disorders of both mitral and tricuspid valves: Secondary | ICD-10-CM | POA: Diagnosis not present

## 2022-09-04 DIAGNOSIS — E7849 Other hyperlipidemia: Secondary | ICD-10-CM | POA: Diagnosis not present

## 2022-09-04 DIAGNOSIS — R400 Somnolence: Secondary | ICD-10-CM | POA: Diagnosis not present

## 2022-09-04 DIAGNOSIS — R0683 Snoring: Secondary | ICD-10-CM | POA: Insufficient documentation

## 2022-09-04 DIAGNOSIS — I5042 Chronic combined systolic (congestive) and diastolic (congestive) heart failure: Secondary | ICD-10-CM | POA: Insufficient documentation

## 2022-09-04 DIAGNOSIS — I10 Essential (primary) hypertension: Secondary | ICD-10-CM | POA: Diagnosis not present

## 2022-09-04 DIAGNOSIS — M1990 Unspecified osteoarthritis, unspecified site: Secondary | ICD-10-CM | POA: Diagnosis not present

## 2022-09-04 HISTORY — PX: CARDIOVERSION: SHX1299

## 2022-09-04 HISTORY — PX: TEE WITHOUT CARDIOVERSION: SHX5443

## 2022-09-04 SURGERY — ECHOCARDIOGRAM, TRANSESOPHAGEAL
Anesthesia: Monitor Anesthesia Care

## 2022-09-04 MED ORDER — PROPOFOL 10 MG/ML IV BOLUS
INTRAVENOUS | Status: DC | PRN
  Administered 2022-09-04: 40 mg via INTRAVENOUS
  Administered 2022-09-04: 50 mg via INTRAVENOUS

## 2022-09-04 MED ORDER — LACTATED RINGERS IV SOLN
INTRAVENOUS | Status: AC | PRN
  Administered 2022-09-04: 1000 mL via INTRAVENOUS

## 2022-09-04 MED ORDER — LIDOCAINE 2% (20 MG/ML) 5 ML SYRINGE
INTRAMUSCULAR | Status: DC | PRN
  Administered 2022-09-04: 40 mg via INTRAVENOUS

## 2022-09-04 MED ORDER — PROPOFOL 500 MG/50ML IV EMUL
INTRAVENOUS | Status: DC | PRN
  Administered 2022-09-04: 150 ug/kg/min via INTRAVENOUS

## 2022-09-04 MED ORDER — SODIUM CHLORIDE 0.9 % IV SOLN: INTRAVENOUS | Status: DC

## 2022-09-04 MED ORDER — PHENYLEPHRINE 80 MCG/ML (10ML) SYRINGE FOR IV PUSH (FOR BLOOD PRESSURE SUPPORT)
PREFILLED_SYRINGE | INTRAVENOUS | Status: DC | PRN
  Administered 2022-09-04 (×2): 80 ug via INTRAVENOUS

## 2022-09-04 NOTE — Anesthesia Procedure Notes (Signed)
Procedure Name: MAC Date/Time: 09/04/2022 10:43 AM  Performed by: Lorie Phenix, CRNAPre-anesthesia Checklist: Patient identified, Emergency Drugs available, Patient being monitored and Suction available Patient Re-evaluated:Patient Re-evaluated prior to induction Oxygen Delivery Method: Nasal cannula Placement Confirmation: positive ETCO2

## 2022-09-04 NOTE — Interval H&P Note (Signed)
History and Physical Interval Note:  09/04/2022 10:19 AM  Waldron Labs  has presented today for surgery, with the diagnosis of AFIB.  The various methods of treatment have been discussed with the patient and family. After consideration of risks, benefits and other options for treatment, the patient has consented to  Procedure(s): TRANSESOPHAGEAL ECHOCARDIOGRAM (TEE) (N/A) CARDIOVERSION (N/A) as a surgical intervention.  The patient's history has been reviewed, patient examined, no change in status, stable for surgery.  I have reviewed the patient's chart and labs.  Questions were answered to the patient's satisfaction.     [employer]

## 2022-09-04 NOTE — Anesthesia Preprocedure Evaluation (Addendum)
Anesthesia Evaluation  Patient identified by MRN, date of birth, ID band Patient awake    Reviewed: Allergy & Precautions, NPO status , Patient's Chart, lab work & pertinent test results  History of Anesthesia Complications Negative for: history of anesthetic complications  Airway Mallampati: II  TM Distance: >3 FB Neck ROM: Full    Dental  (+) Dental Advisory Given   Pulmonary asthma ,    Pulmonary exam normal        Cardiovascular hypertension, Pt. on home beta blockers and Pt. on medications + dysrhythmias Atrial Fibrillation  Rhythm:Irregular Rate:Normal   '23 TTE - EF 55 to 60%. There is mild concentric left ventricular hypertrophy of the septal segment. Grade I diastolic dysfunction (impaired relaxation). Left atrial size was mildly dilated. Right atrial size was mildly dilated. Aortic valve regurgitation is trivial. There is moderate dilatation of the aortic root, measuring 46 mm. There is moderate dilatation of the ascending aorta, measuring 44 mm.     Neuro/Psych negative neurological ROS  negative psych ROS   GI/Hepatic Neg liver ROS, GERD  Medicated,  Endo/Other   Obesity   Renal/GU negative Renal ROS     Musculoskeletal  (+) Arthritis ,   Abdominal   Peds  Hematology  On eliquis    Anesthesia Other Findings   Reproductive/Obstetrics                             Anesthesia Physical Anesthesia Plan  ASA: 3  Anesthesia Plan: General and MAC   Post-op Pain Management:    Induction: Intravenous  PONV Risk Score and Plan: 2 and Treatment may vary due to age or medical condition and Propofol infusion  Airway Management Planned: Natural Airway and Mask  Additional Equipment: None  Intra-op Plan:   Post-operative Plan:   Informed Consent: I have reviewed the patients History and Physical, chart, labs and discussed the procedure including the risks, benefits and  alternatives for the proposed anesthesia with the patient or authorized representative who has indicated his/her understanding and acceptance.       Plan Discussed with: CRNA and Anesthesiologist  Anesthesia Plan Comments: (May begin procedure as MAC with conversion to GA as indicated by procedure )        Anesthesia Quick Evaluation

## 2022-09-04 NOTE — Telephone Encounter (Signed)
Left sleep study results on cell voicemail. Ok per [employer].

## 2022-09-04 NOTE — Telephone Encounter (Signed)
-----   Message from Joseph Margarita, MD sent at 08/31/2022  9:31 PM EDT ----- Please let patient know that sleep study showed no significant sleep apnea.

## 2022-09-04 NOTE — Transfer of Care (Signed)
Immediate Anesthesia Transfer of Care Note  Patient: [name]  Procedure(s) Performed: TRANSESOPHAGEAL ECHOCARDIOGRAM (TEE) CARDIOVERSION  Patient Location: PACU  Anesthesia Type:MAC and General  Level of Consciousness: awake  Airway & Oxygen Therapy: Patient Spontanous Breathing and Patient connected to nasal cannula oxygen  Post-op Assessment: Report given to RN and Post -op Vital signs reviewed and stable  Post vital signs: Reviewed and stable  Last Vitals:  Vitals Value Taken Time  BP 100/79 09/04/22 1116  Temp    Pulse 72 09/04/22 1119  Resp 26 09/04/22 1121  SpO2 97 % 09/04/22 1119  Vitals shown include unvalidated device data.  Last Pain:  Vitals:   09/04/22 0937  TempSrc: Temporal  PainSc: 0-No pain         Complications: No notable events documented.

## 2022-09-04 NOTE — Anesthesia Postprocedure Evaluation (Addendum)
Anesthesia Post Note  Patient: [name]  Procedure(s) Performed: TRANSESOPHAGEAL ECHOCARDIOGRAM (TEE) CARDIOVERSION     Patient location during evaluation: PACU Anesthesia Type: General Level of consciousness: awake and alert Pain management: pain level controlled Vital Signs Assessment: post-procedure vital signs reviewed and stable Respiratory status: spontaneous breathing, nonlabored ventilation and respiratory function stable Cardiovascular status: stable and blood pressure returned to baseline Anesthetic complications: no   No notable events documented.  Last Vitals:  Vitals:   09/04/22 1130 09/04/22 1145  BP: 112/80 116/85  Pulse: 70 67  Resp: 20 17  Temp:  36.4 C  SpO2: 95% 94%    Last Pain:  Vitals:   09/04/22 1145  TempSrc:   PainSc: 0-No pain                 Audry Pili

## 2022-09-04 NOTE — CV Procedure (Signed)
   Transesophageal Echocardiogram  Indications: Atrial flutter  Time out performed  During this procedure the patient was administered propofol under anesthesiology supervision to achieve and maintain moderate sedation.  The patient's heart rate, blood pressure, and oxygen saturation are monitored continuously during the procedure.   Findings:  Left Ventricle: Mild to moderately reduced ejection fraction EF 45%  Mitral Valve: Trace mitral regurgitation  Aortic Valve: Trileaflet no regurgitation  Aorta: 42 mm ascending aorta  Tricuspid Valve: Trace tricuspid regurgitation  Left Atrium: Normal, no left atrial appendage thrombus  Right Atrium: Mildly dilated  Intraatrial septum: Redundant interatrial septum (10 mm excursion from centerline).  Small PFO noted.  Color-flow Doppler demonstrated.  Bubble Contrast Study: none  Candee Furbish, MD     Electrical Cardioversion Procedure Note DOMNICK CHERVENAK [id number] Jun 18, 1951  Procedure: Electrical Cardioversion Indications:  Atrial Flutter  Time Out: Verified patient identification, verified procedure,medications/allergies/relevent history reviewed, required imaging and test results available.  Performed  Procedure Details  The patient was NPO after midnight. Anesthesia was administered at the beside  by [facility] with propofol.  Cardioversion was performed with synchronized biphasic defibrillation via AP pads with 100 joules.  1 attempt(s) were performed.  The patient converted to normal sinus rhythm. The patient tolerated the procedure well   IMPRESSION:  Successful cardioversion of atrial flutter.    Shaterra Sanzone 09/04/2022, 11:21 AM

## 2022-09-04 NOTE — Progress Notes (Signed)
  Echocardiogram 2D Echocardiogram has been performed.  [name] 09/04/2022, 11:28 AM

## 2022-09-07 ENCOUNTER — Encounter (HOSPITAL_COMMUNITY): Payer: Self-pay | Admitting: Cardiology

## 2022-09-09 MED ORDER — BUPIVACAINE HCL 0.25 % IJ SOLN
5.0000 mL | INTRAMUSCULAR | Status: AC | PRN
  Administered 2022-09-03: 5 mL via INTRA_ARTICULAR

## 2022-09-09 MED ORDER — TRIAMCINOLONE ACETONIDE 40 MG/ML IJ SUSP
40.0000 mg | INTRAMUSCULAR | Status: AC | PRN
  Administered 2022-09-03: 40 mg via INTRA_ARTICULAR

## 2022-09-09 NOTE — Progress Notes (Signed)
   [name] - 71 y.o. male MRN [id number]  Date of birth: 1951/08/14  Office Visit Note: Visit Date: 09/03/2022 PCP: Biagio Borg, MD Referred by: Biagio Borg, MD  Subjective: Chief Complaint  Patient presents with   Right Shoulder - Pain   HPI:  [name] is a 71 y.o. male who comes in today at the request of Dr. Anderson Malta for planned Right anesthetic glenohumeral arthrogram with fluoroscopic guidance.  The patient has failed conservative care including home exercise, medications, time and activity modification.  This injection will be diagnostic and hopefully therapeutic.  Please see requesting physician notes for further details and justification.   ROS Otherwise per HPI.  Assessment & Plan: Visit Diagnoses:    ICD-10-CM   1. Chronic right shoulder pain  M25.511 XR C-ARM NO REPORT   G89.29       Plan: No additional findings.   Meds & Orders: No orders of the defined types were placed in this encounter.   Orders Placed This Encounter  Procedures   Large Joint Inj   XR C-ARM NO REPORT    Follow-up: Return if symptoms worsen or fail to improve.   Procedures: Large Joint Inj: R glenohumeral on 09/03/2022 1:00 PM Indications: pain and diagnostic evaluation Details: 22 G 3.5 in needle, fluoroscopy-guided anteromedial approach  Arthrogram: No  Medications: 40 mg triamcinolone acetonide 40 MG/ML; 5 mL bupivacaine 0.25 % Outcome: tolerated well, no immediate complications  There was excellent flow of contrast producing a partial arthrogram of the glenohumeral joint. The patient did have relief of symptoms during the anesthetic phase of the injection. Procedure, treatment alternatives, risks and benefits explained, specific risks discussed. Consent was given by the patient. Immediately prior to procedure a time out was called to verify the correct patient, procedure, equipment, support staff and site/side marked as required. Patient was prepped and draped in  the usual sterile fashion.          Clinical History: No specialty comments available.     Objective:  VS:  HT:    WT:   BMI:     BP:   HR: bpm  TEMP: ( )  RESP:  Physical Exam   Imaging: No results found.

## 2022-09-19 ENCOUNTER — Encounter (HOSPITAL_BASED_OUTPATIENT_CLINIC_OR_DEPARTMENT_OTHER): Payer: Self-pay | Admitting: Family

## 2022-09-19 ENCOUNTER — Ambulatory Visit (INDEPENDENT_AMBULATORY_CARE_PROVIDER_SITE_OTHER): Payer: BC Managed Care – PPO | Admitting: Family

## 2022-09-19 VITALS — BP 96/68 | HR 82 | Ht 70.0 in | Wt 237.1 lb

## 2022-09-19 DIAGNOSIS — R5383 Other fatigue: Secondary | ICD-10-CM

## 2022-09-19 DIAGNOSIS — I480 Paroxysmal atrial fibrillation: Secondary | ICD-10-CM

## 2022-09-19 DIAGNOSIS — D6859 Other primary thrombophilia: Secondary | ICD-10-CM

## 2022-09-19 DIAGNOSIS — I5042 Chronic combined systolic (congestive) and diastolic (congestive) heart failure: Secondary | ICD-10-CM

## 2022-09-19 MED ORDER — METOPROLOL SUCCINATE ER 25 MG PO TB24: 25.0000 mg | ORAL_TABLET | Freq: Every day | ORAL | 3 refills | Status: DC

## 2022-09-19 NOTE — Patient Instructions (Addendum)
Medication Instructions:  Your physician recommends that you continue on your current medications as directed. Please refer to the Current Medication list given to you today.   *If you need a refill on your cardiac medications before your next appointment, please call your pharmacy*  Lab Work: VITAMIN D AND VITAMIN B12   Testing/Procedures: NONE   Follow-Up: At [facility], you and your health needs are our priority.  As part of our continuing mission to provide you with exceptional heart care, we have created designated Provider Care Teams.  These Care Teams include your primary Cardiologist (physician) and Advanced Practice Providers (APPs -  Physician Assistants and Nurse Practitioners) who all work together to provide you with the care you need, when you need it.  We recommend signing up for the patient portal called "MyChart".  Sign up information is provided on this After Visit Summary.  MyChart is used to connect with patients for Virtual Visits (Telemedicine).  Patients are able to view lab/test results, encounter notes, upcoming appointments, etc.  Non-urgent messages can be sent to your provider as well.   To learn more about what you can do with MyChart, go to [email or web address].    Your next appointment:   4 month(s)  The format for your next appointment:   In Person  Provider:   Skeet Latch, MD

## 2022-09-19 NOTE — Progress Notes (Signed)
Office Visit    Patient Name: [name] Date of Encounter: 09/19/2022  PCP:  Biagio Borg, MD   [place]  Cardiologist:  Skeet Latch, MD  Advanced Practice Provider:  No care team member to display Electrophysiologist:  None      Chief Complaint    [name] is a 71 y.o. male presents today for follow up after cardioversion   Past Medical History    Past Medical History:  Diagnosis Date   ALLERGIC RHINITIS 12/31/2007   Aortic atherosclerosis ([place]) 08/26/2022   Arthritis    Chronic combined systolic and diastolic heart failure ([place]) 06/14/2021   COUGH, CHRONIC 07/18/2007   DEGENERATIVE JOINT DISEASE 07/18/2007   Extrinsic asthma, unspecified 20-Sep-2009   GERD 07/18/2007   HYPERLIPIDEMIA 08/14/2010   Hypertension    Impaired glucose tolerance 09/23/2011   OBESITY 07/18/2007   Pneumonia    Past Surgical History:  Procedure Laterality Date   CARDIOVERSION N/A 12/28/2020   Procedure: CARDIOVERSION;  Surgeon: Skeet Latch, MD;  Location: [place];  Service: Cardiovascular;  Laterality: N/A;   CARDIOVERSION N/A 09/04/2022   Procedure: CARDIOVERSION;  Surgeon: Jerline Pain, MD;  Location: [place];  Service: Cardiovascular;  Laterality: N/A;   DG FEMUR LEFT  ([place] [facility])     rod placed   LUMBAR [place]     TEE WITHOUT CARDIOVERSION N/A 12/28/2020   Procedure: TRANSESOPHAGEAL ECHOCARDIOGRAM (TEE);  Surgeon: Skeet Latch, MD;  Location: [place];  Service: Cardiovascular;  Laterality: N/A;   TEE WITHOUT CARDIOVERSION N/A 09/04/2022   Procedure: TRANSESOPHAGEAL ECHOCARDIOGRAM (TEE);  Surgeon: Jerline Pain, MD;  [facility];  Service: Cardiovascular;  Laterality: N/A;   TOOTH EXTRACTION     TOTAL KNEE ARTHROPLASTY Right 07/26/2018   Procedure: RIGHT TOTAL KNEE ARTHROPLASTY;  Surgeon: Jessy Oto, MD;  Location: [place];  Service: Orthopedics;  Laterality: Right;   Allergies  No Known  Allergies  History of Present Illness    [name] is a 71 y.o. male with a hx of paroxysmal atrial fibrillation, chronic systolic and diastolic heart failure with recovered LVEF, hyperlipidemia, hypertension, obesity, minimal nonobstructive coronary disease. GERD last seen for [facility] 09/04/22.  Echocardiogram 01/03/2022 with normal LVEF 55 to 60%, mild LVH, grade 1 diastolic dysfunction, moderate dilation of the aortic root 46 mm and moderate dilation of the ascending aorta 44 mm which is overall unchanged compared to 2022.   Seen 08/26/22 in atrial flutter. 09/04/22 underwent TEE/DCCV revealing EF 45%, trivial MR, mild dilation ascending aorta 53mm and mild dilation aortic root 57mm.   Very pleasant gentleman who presents today for follow-up.  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 He follows a heart healthy diet and is appreciative of his wife doing most of the cooking.  No formal exercise routine. Reports no shortness of breath nor dyspnea on exertion. Reports no chest pain, pressure, or tightness. No edema, orthopnea, PND. Reports no palpitations.  Continues to note generalized fatigue.   EKGs/Labs/Other Studies Reviewed:   The following studies were reviewed today: TEE Sep 20, 2022 Left Ventricle: Mild to moderately reduced ejection fraction EF 45%   Mitral Valve: Trace mitral regurgitation   Aortic Valve: Trileaflet no regurgitation   Aorta: 42 mm ascending aorta   Tricuspid Valve: Trace tricuspid regurgitation   Left Atrium: Normal, no left atrial appendage thrombus   Right Atrium: Mildly dilated   Intraatrial septum: Redundant interatrial septum (10  mm excursion from centerline).  Small PFO noted.  Color-flow Doppler demonstrated.   Bubble Contrast Study: none  CHA2DS2-VASc Score = 4   This indicates a 4.8% annual risk of stroke. The patient's score is based upon: CHF History: 1 HTN History: 1 Diabetes History: 0 Stroke History:  0 Vascular Disease History: 1 Age Score: 1 Gender Score: 0      EKG:  EKG is ordered today.  The ekg ordered today demonstrates NSR 82 bpm with first degree AV block PR 210   Recent Labs: 04/03/2022: TSH 1.36 08/20/2022: ALT 20 09/01/2022: BUN 17; Creatinine, Ser 1.24; Hemoglobin 14.9; Platelets 195; Potassium 4.6; Sodium 143  Recent Lipid Panel    Component Value Date/Time   CHOL 132 08/20/2022 0827   TRIG 96 08/20/2022 0827   HDL 50 08/20/2022 0827   CHOLHDL 2.6 08/20/2022 0827   CHOLHDL 3.7 06/15/2020 1602   VLDL 22.6 02/10/2019 1640   LDLCALC 64 08/20/2022 0827   LDLCALC 109 (H) 06/15/2020 1602   LDLDIRECT 132.9 09/23/2011 1557    Home Medications   Current Meds  Medication Sig   acetaminophen (TYLENOL) 500 MG tablet Take 500 mg by mouth every morning.   albuterol (PROAIR HFA) 108 (90 Base) MCG/ACT inhaler Inhale 2 puffs into the lungs every 6 (six) hours as needed for wheezing or shortness of breath.   apixaban (ELIQUIS) 5 MG TABS tablet Take 1 tablet (5 mg total) by mouth 2 (two) times daily.   budesonide-formoterol (SYMBICORT) 80-4.5 MCG/ACT inhaler Inhale 2 puffs into the lungs 2 (two) times daily. (Patient taking differently: Inhale 2 puffs into the lungs 2 (two) times daily as needed (Wheezing / SOB).)   esomeprazole (NEXIUM) 40 MG capsule Take 1 capsule (40 mg total) by mouth daily at 12 noon. Take 1 Daily (Patient taking differently: Take 40 mg by mouth daily as needed (acid reflux).)   gabapentin (NEURONTIN) 300 MG capsule Take 1 capsule (300 mg total) by mouth at bedtime. 1 capsule at bedtime   Melatonin 10 MG TABS Take 10 mg by mouth at bedtime as needed (sleep).   Multiple Vitamins-Minerals (CENTRUM ADULTS PO) Take 1 tablet by mouth daily.   rosuvastatin (CRESTOR) 20 MG tablet Take 20 mg by mouth every evening.   [DISCONTINUED] metoprolol succinate (TOPROL-XL) 25 MG 24 hr tablet Take 1 tablet (25 mg total) by mouth daily. (Patient taking differently: Take 25 mg by  mouth every evening.)     Review of Systems      All other systems reviewed and are otherwise negative except as noted above.  Physical Exam    VS:  BP 96/68   Pulse 82   Ht 5\' 10"  (1.778 m)   Wt 237 lb 1.6 oz (107.5 kg)   BMI 34.02 kg/m  , BMI Body mass index is 34.02 kg/m.  Wt Readings from Last 3 Encounters:  09/19/22 237 lb 1.6 oz (107.5 kg)  09/04/22 240 lb 15.4 oz (109.3 kg)  08/26/22 241 lb (109.3 kg)    GEN: Well nourished, well developed, in no acute distress. HEENT: normal. Neck: Supple, no JVD, carotid bruits, or masses. Cardiac: RRR, no murmurs, rubs, or gallops. No clubbing, cyanosis, edema.  Radials/PT 2+ and equal bilaterally.  Respiratory:  Respirations regular and unlabored, clear to auscultation bilaterally. GI: Soft, nontender, nondistended. MS: No deformity or atrophy. Skin: Warm and dry, no rash. Neuro:  Strength and sensation are intact. Psych: Normal affect.  Assessment & Plan    Aorta and aortic root  dilation -01/2022 echo aortic root 46 mm and ascending aorta 44 mm.  TEE 09/04/22 ascending aorta 91mm. Consider repeat imaging in one year  - can be coordinated at follow up. .  Continue optimal blood pressure control, metoprolol  Fatigue - Generalized fatigue. Recent sleep study with no OSA. Update vitamin D, folate, B12 to assess for fatigue.   CAD - Minimal nonobstructive disease by cardiac CTA 05/2021. Continue aspirin, metoprolol, rosuvastatin. Stable with no anginal symptoms. No indication for ischemic evaluation.  Marland KitchenHeart healthy diet and regular cardiovascular exercise encouraged.    HTN - Relatively hypotensive today in clinic though asymptomatic. Continue Metoprolol.  Atrial flutter / hpercoagulable state - Maintaining sinus rhythm post cardioversion. Continue Metoprolol Succinate 25mg  daily. CHA2DS2-VASc Score = 4 [CHF History: 1, HTN History: 1, Diabetes History: 0, Stroke History: 0, Vascular Disease History: 1, Age Score: 1, Gender Score: 0].   Therefore, the patient's annual risk of stroke is 4.8 %.    Continue Eliquis 5mg  BID. Does not meet dose reduction criteria. Denies bleeding complications.   HFrEF - EF previously reduced <20% at time of previous atrial flutter. With recent recurrent of atrial flutter, EF 45% via TEE 08/2022. Euvolemic and well compensated on exam. No orthopnea, dyspnea, edema. Relative hypotension precludes addition of ACE/ARB/ARNI. Continue Metoprolol. No indication for loop diuretic at this time. Could consider repeat echo at follow up as clinically indicated to reassess LVEF in NSR.   CHA2DS2-VASc Score = 4 [CHF History: 1, HTN History: 1, Diabetes History: 0, Stroke History: 0, Vascular Disease History: 1, Age Score: 1, Gender Score: 0].  Therefore, the patient's annual risk of stroke is 4.8 %.     Echo few months   Disposition: Follow up in 4 months with Skeet Latch, MD or APP.  Signed, Loel Dubonnet, NP 09/19/2022, 12:53 PM  [facility]

## 2022-09-29 ENCOUNTER — Encounter: Payer: Self-pay | Admitting: Cardiology

## 2022-09-29 DIAGNOSIS — I4892 Unspecified atrial flutter: Secondary | ICD-10-CM

## 2022-09-29 LAB — VITAMIN D 1,25 DIHYDROXY
Vitamin D 1, 25 (OH)2 Total: 59 pg/mL
Vitamin D2 1, 25 (OH)2: 20 pg/mL
Vitamin D3 1, 25 (OH)2: 39 pg/mL

## 2022-09-29 LAB — B12 AND FOLATE PANEL
Folate: >20 ng/mL (ref >3.0)
Vitamin B-12: 542 pg/mL (ref 232–1245)

## 2022-12-08 ENCOUNTER — Telehealth (HOSPITAL_BASED_OUTPATIENT_CLINIC_OR_DEPARTMENT_OTHER): Payer: Self-pay | Admitting: Cardiovascular Disease

## 2022-12-08 ENCOUNTER — Telehealth: Payer: Self-pay | Admitting: Physical Medicine and Rehabilitation

## 2022-12-08 MED ORDER — ROSUVASTATIN CALCIUM 20 MG PO TABS: 20.0000 mg | ORAL_TABLET | Freq: Every evening | ORAL | 1 refills | Status: DC

## 2022-12-08 NOTE — Telephone Encounter (Signed)
Rx request sent to pharmacy.  

## 2022-12-08 NOTE — Telephone Encounter (Signed)
*  STAT* If patient is at the pharmacy, call can be transferred to refill team.   1. Which medications need to be refilled? (please list name of each medication and dose if known)   rosuvastatin (CRESTOR) 20 MG tablet   2. Which pharmacy/location (including street and city if local pharmacy) is medication to be sent to?  [id number] - [place], [place] - [address]   3. Do they need a 30 day or 90 day supply? 90 day   Patient stated he is almost out of this medication.    Patient stated [facility] [address]

## 2022-12-08 NOTE — Telephone Encounter (Signed)
Spoke with patient and he is having right shoulder pain. It is the same pain and no new injuries. Scheduled for 12/09/22

## 2022-12-08 NOTE — Telephone Encounter (Signed)
Patient requesting an appt for another shoulder injection. Please advise..[phone]

## 2022-12-09 ENCOUNTER — Ambulatory Visit: Payer: PPO | Admitting: Physical Medicine and Rehabilitation

## 2022-12-09 ENCOUNTER — Ambulatory Visit: Payer: Self-pay

## 2022-12-09 DIAGNOSIS — M19011 Primary osteoarthritis, right shoulder: Secondary | ICD-10-CM

## 2022-12-09 DIAGNOSIS — G8929 Other chronic pain: Secondary | ICD-10-CM | POA: Diagnosis not present

## 2022-12-09 DIAGNOSIS — M25511 Pain in right shoulder: Secondary | ICD-10-CM

## 2022-12-09 NOTE — Progress Notes (Signed)
   [name] - 72 y.o. male MRN [id number]  Date of birth: [birth year]  Office Visit Note: Visit Date: 12/09/2022 PCP: Biagio Borg, MD Referred by: Biagio Borg, MD  Subjective: Chief Complaint  Patient presents with   Right Shoulder - Pain   HPI:  [name] is a 72 y.o. male who comes in today at the request of Dr. Basil Dess for planned Right anesthetic glenohumeral arthrogram with fluoroscopic guidance.  The patient has failed conservative care including home exercise, medications, time and activity modification.  This injection will be diagnostic and hopefully therapeutic.  Please see requesting physician notes for further details and justification.   ROS Otherwise per HPI.  Assessment & Plan: Visit Diagnoses:    ICD-10-CM   1. Chronic right shoulder pain  M25.511 XR C-ARM NO REPORT   G89.29 Large Joint Inj: R glenohumeral    2. Primary osteoarthritis, right shoulder  M19.011 XR C-ARM NO REPORT    Large Joint Inj: R glenohumeral      Plan: No additional findings.   Meds & Orders: No orders of the defined types were placed in this encounter.   Orders Placed This Encounter  Procedures   Large Joint Inj: R glenohumeral   XR C-ARM NO REPORT    Follow-up: No follow-ups on file.   Procedures: Large Joint Inj: R glenohumeral on 12/09/2022 9:24 AM Indications: pain and diagnostic evaluation Details: 22 G 3.5 in needle, fluoroscopy-guided anteromedial approach  Arthrogram: No  Medications: 40 mg triamcinolone acetonide 40 MG/ML; 5 mL bupivacaine 0.25 % Outcome: tolerated well, no immediate complications  There was excellent flow of contrast producing a partial arthrogram of the glenohumeral joint. The patient did have relief of symptoms during the anesthetic phase of the injection. Procedure, treatment alternatives, risks and benefits explained, specific risks discussed. Consent was given by the patient. Immediately prior to procedure a time out was called to  verify the correct patient, procedure, equipment, support staff and site/side marked as required. Patient was prepped and draped in the usual sterile fashion.          Clinical History: No specialty comments available.     Objective:  VS:  HT:    WT:   BMI:     BP:   HR: bpm  TEMP: ( )  RESP:  Physical Exam   Imaging: No results found.

## 2022-12-09 NOTE — Progress Notes (Signed)
Functional Pain Scale - descriptive words and definitions  Distracting (5)    Aware of pain/able to complete some ADL's but limited by pain/sleep is affected and active distractions are only slightly useful. Moderate range order  Average Pain 5   +Driver, -BT, -Dye Allergies.  Right shoulder pain

## 2022-12-17 ENCOUNTER — Telehealth: Payer: Self-pay | Admitting: Cardiovascular Disease

## 2022-12-17 NOTE — Telephone Encounter (Signed)
Call back to patient who states prior to a dental appt. This morning the staff at the dentist office checked his pulse, stated they were having difficulty finding it, got "the machine," rechecked it and said it was fast.  Patient could not recall the rate they told him or the blood pressure reading they got.  Pt denies any symptoms states he feels fine.  Pt confirmed he is taking his Eliquis and did not hold it for his procedure.  Pt. Checked his Bp while waiting for nurse call back and reading was 123/83-76.  Pt denies shortness of breath, fatigue or feeling of heart pounding.  Pt accepted an appt with Laurann Montana NP on 12/23/2022 and verbalized understanding he needs to call the office he start having symptoms.  Georgana Curio MHA RN CCM

## 2022-12-17 NOTE — Telephone Encounter (Signed)
Agree with plan for office visit. Continue Eliquis, Metoprolol. If he can check BP and HR once per day at home until his office visit it would be helpful.   Loel Dubonnet, NP

## 2022-12-17 NOTE — Telephone Encounter (Signed)
Patient states he just left the dentist and he asked if they could check his BP/HR. He states the dentist told him that his heart was beating very fast, but they never gave him an exact rate. Patient states he felt fine and hasn't had any symptoms, but he is concerned that he may be back in afib. Patient would like to know if he needs to be seen sooner than 2/20 appointment with Dr. Oval Linsey for an EKG.

## 2022-12-18 NOTE — Telephone Encounter (Signed)
Returned call to patient and provided the following information to the patient. Patient verbalizes understanding and states he will bring the log with him to the office.   "Agree with plan for office visit. Continue Eliquis, Metoprolol. If he can check BP and HR once per day at home until his office visit it would be helpful.    Loel Dubonnet, NP "

## 2022-12-21 MED ORDER — TRIAMCINOLONE ACETONIDE 40 MG/ML IJ SUSP
40.0000 mg | INTRAMUSCULAR | Status: AC | PRN
  Administered 2022-12-09: 40 mg via INTRA_ARTICULAR

## 2022-12-21 MED ORDER — BUPIVACAINE HCL 0.25 % IJ SOLN
5.0000 mL | INTRAMUSCULAR | Status: AC | PRN
  Administered 2022-12-09: 5 mL via INTRA_ARTICULAR

## 2022-12-23 ENCOUNTER — Encounter (HOSPITAL_BASED_OUTPATIENT_CLINIC_OR_DEPARTMENT_OTHER): Payer: Self-pay | Admitting: Family

## 2022-12-23 ENCOUNTER — Ambulatory Visit (INDEPENDENT_AMBULATORY_CARE_PROVIDER_SITE_OTHER): Payer: PPO | Admitting: Family

## 2022-12-23 VITALS — BP 98/64 | HR 103 | Ht 72.0 in | Wt 241.0 lb

## 2022-12-23 DIAGNOSIS — D6859 Other primary thrombophilia: Secondary | ICD-10-CM

## 2022-12-23 DIAGNOSIS — I4892 Unspecified atrial flutter: Secondary | ICD-10-CM | POA: Diagnosis not present

## 2022-12-23 DIAGNOSIS — I5042 Chronic combined systolic (congestive) and diastolic (congestive) heart failure: Secondary | ICD-10-CM

## 2022-12-23 DIAGNOSIS — I480 Paroxysmal atrial fibrillation: Secondary | ICD-10-CM | POA: Diagnosis not present

## 2022-12-23 DIAGNOSIS — E785 Hyperlipidemia, unspecified: Secondary | ICD-10-CM | POA: Diagnosis not present

## 2022-12-23 DIAGNOSIS — I25118 Atherosclerotic heart disease of native coronary artery with other forms of angina pectoris: Secondary | ICD-10-CM | POA: Diagnosis not present

## 2022-12-23 MED ORDER — METOPROLOL SUCCINATE ER 25 MG PO TB24: 37.5000 mg | ORAL_TABLET | Freq: Every day | ORAL | 3 refills | Status: DC

## 2022-12-23 NOTE — Progress Notes (Unsigned)
Office Visit    Patient Name: [name] Date of Encounter: 12/23/2022  PCP:  Corwin Levins, MD   [facility]  Cardiologist:  Chilton Si, MD  Advanced Practice Provider:  No care team member to display Electrophysiologist:  None      Chief Complaint    [name] is a 72 y.o. male presents today for recurrent atrial flutter  Past Medical History    Past Medical History:  Diagnosis Date   ALLERGIC RHINITIS 12/31/2007   Aortic atherosclerosis (HCC) 08/26/2022   Arthritis    Chronic combined systolic and diastolic heart failure (HCC) 06/14/2021   COUGH, CHRONIC 07/18/2007   DEGENERATIVE JOINT DISEASE 07/18/2007   Extrinsic asthma, unspecified 09/08/2009   GERD 07/18/2007   HYPERLIPIDEMIA 08/14/2010   Hypertension    Impaired glucose tolerance 09/23/2011   OBESITY 07/18/2007   Pneumonia    Past Surgical History:  Procedure Laterality Date   CARDIOVERSION N/A 12/28/2020   Procedure: CARDIOVERSION;  Surgeon: Chilton Si, MD;  Location: [facility] ENDOSCOPY;  Service: Cardiovascular;  Laterality: N/A;   CARDIOVERSION N/A 09/04/2022   Procedure: CARDIOVERSION;  Surgeon: Jake Bathe, MD;  Location: [facility] ENDOSCOPY;  Service: Cardiovascular;  Laterality: N/A;   DG FEMUR LEFT  ([facility] HX)     rod placed   LUMBAR DISC SURGERY     TEE WITHOUT CARDIOVERSION N/A 12/28/2020   Procedure: TRANSESOPHAGEAL ECHOCARDIOGRAM (TEE);  Surgeon: Chilton Si, MD;  Location: [facility] ENDOSCOPY;  Service: Cardiovascular;  Laterality: N/A;   TEE WITHOUT CARDIOVERSION N/A 09/04/2022   Procedure: TRANSESOPHAGEAL ECHOCARDIOGRAM (TEE);  Surgeon: Jake Bathe, MD;  Location: [facility] ENDOSCOPY;  Service: Cardiovascular;  Laterality: N/A;   TOOTH EXTRACTION     TOTAL KNEE ARTHROPLASTY Right 07/26/2018   Procedure: RIGHT TOTAL KNEE ARTHROPLASTY;  Surgeon: Kerrin Champagne, MD;  Location: [facility] OR;  Service: Orthopedics;  Laterality: Right;   Allergies  No Known  Allergies  History of Present Illness    [name] is a 72 y.o. male with a hx of paroxysmal atrial fibrillation, chronic systolic and diastolic heart failure with recovered LVEF, hyperlipidemia, hypertension, obesity, minimal nonobstructive coronary disease. GERD.  He was last seen 09/19/2022  Initial evaluation 12/2020 by atrial fibrillation clinic at which time he was started on Eliquis and metoprolol.  TEE/DCCV 12/2020.  Echo LVEF less than 20%, global hypokinesis, secundum ASD.  Repeat echo revealed LVEF improved to 50%.  Coronary CTA 05/2021 minimal noncalcified plaque as well as aortic atherosclerosis. Echocardiogram 01/03/2022 with normal LVEF 55 to 60%, mild LVH, grade 1 diastolic dysfunction, moderate dilation of the aortic root 46 mm and moderate dilation of the ascending aorta 44 mm which is overall unchanged compared to 2022.   Seen 08/26/22 in atrial flutter. 09/04/22 underwent TEE/DCCV revealing EF 45%, trivial MR, mild dilation ascending aorta 21mm and mild dilation aortic root 39mm.  At follow-up 09/19/2022 he was maintaining sinus rhythm.  He did note generalized fatigue and laboratory testing including B12, folate, vitamin D unremarkable.  He contacted the office after being told by his dentist that his heart was racing.  It was scheduled for follow-up. 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 He follows a heart healthy diet and is appreciative of his wife doing most of the cooking.  No formal exercise routine. Reports no shortness of breath and stable mild dyspnea on exertion. Reports no chest pain, pressure, or tightness. No edema,  orthopnea, PND. Reports no palpitations.  Continues to note generalized fatigue. Notes his wife bought him a Investment banker, operational. He has difficulty using it and routinely gets a poor connection error. One cup of coffee in the morning and one glass of tea in the evening.  Does note weight at home recently went up 3  to 4 pounds.  EKGs/Labs/Other Studies Reviewed:   The following studies were reviewed today: TEE 09-Sep-2022 Left Ventricle: Mild to moderately reduced ejection fraction EF 45%   Mitral Valve: Trace mitral regurgitation   Aortic Valve: Trileaflet no regurgitation   Aorta: 42 mm ascending aorta   Tricuspid Valve: Trace tricuspid regurgitation   Left Atrium: Normal, no left atrial appendage thrombus   Right Atrium: Mildly dilated   Intraatrial septum: Redundant interatrial septum (10 mm excursion from centerline).  Small PFO noted.  Color-flow Doppler demonstrated.   Bubble Contrast Study: none  CHA2DS2-VASc Score = 4   This indicates a 4.8% annual risk of stroke. The patient's score is based upon: CHF History: 1 HTN History: 1 Diabetes History: 0 Stroke History: 0 Vascular Disease History: 1 Age Score: 1 Gender Score: 0      EKG:  EKG is ordered today.  The ekg ordered today demonstrates atrial flutter 103 bpm with QTc 521.   Recent Labs: 04/03/2022: TSH 1.36 08/20/2022: ALT 20 09/01/2022: BUN 17; Creatinine, Ser 1.24; Hemoglobin 14.9; Platelets 195; Potassium 4.6; Sodium 143  Recent Lipid Panel    Component Value Date/Time   CHOL 132 08/20/2022 0827   TRIG 96 08/20/2022 0827   HDL 50 08/20/2022 0827   CHOLHDL 2.6 08/20/2022 0827   CHOLHDL 3.7 06/15/2020 1602   VLDL 22.6 02/10/2019 1640   LDLCALC 64 08/20/2022 0827   LDLCALC 109 (H) 06/15/2020 1602   LDLDIRECT 132.9 09/23/2011 1557    Home Medications   Current Meds  Medication Sig   acetaminophen (TYLENOL) 500 MG tablet Take 500 mg by mouth every morning.   albuterol (PROAIR HFA) 108 (90 Base) MCG/ACT inhaler Inhale 2 puffs into the lungs every 6 (six) hours as needed for wheezing or shortness of breath.   apixaban (ELIQUIS) 5 MG TABS tablet Take 1 tablet (5 mg total) by mouth 2 (two) times daily.   budesonide-formoterol (SYMBICORT) 80-4.5 MCG/ACT inhaler Inhale 2 puffs into the lungs 2 (two) times daily.  (Patient taking differently: Inhale 2 puffs into the lungs 2 (two) times daily as needed (Wheezing / SOB).)   esomeprazole (NEXIUM) 40 MG capsule Take 1 capsule (40 mg total) by mouth daily at 12 noon. Take 1 Daily (Patient taking differently: Take 40 mg by mouth daily as needed (acid reflux).)   gabapentin (NEURONTIN) 300 MG capsule Take 1 capsule (300 mg total) by mouth at bedtime. 1 capsule at bedtime   Melatonin 10 MG TABS Take 10 mg by mouth at bedtime as needed (sleep).   metoprolol succinate (TOPROL-XL) 25 MG 24 hr tablet Take 1 tablet (25 mg total) by mouth daily.   Multiple Vitamins-Minerals (CENTRUM ADULTS PO) Take 1 tablet by mouth daily.   rosuvastatin (CRESTOR) 20 MG tablet Take 1 tablet (20 mg total) by mouth every evening.     Review of Systems      All other systems reviewed and are otherwise negative except as noted above.  Physical Exam    VS:  BP 98/64   Pulse (!) 54   Ht 6' (1.829 m)   Wt 241 lb (109.3 kg)   SpO2 94%   BMI 32.69  kg/m  , BMI Body mass index is 32.69 kg/m.  Wt Readings from Last 3 Encounters:  12/23/22 241 lb (109.3 kg)  09/19/22 237 lb 1.6 oz (107.5 kg)  09/04/22 240 lb 15.4 oz (109.3 kg)    GEN: Well nourished, well developed, in no acute distress. HEENT: normal. Neck: Supple, no JVD, carotid bruits, or masses. Cardiac: IRIR, no murmurs, rubs, or gallops. No clubbing, cyanosis, edema.  Radials/PT 2+ and equal bilaterally.  Respiratory:  Respirations regular and unlabored, clear to auscultation bilaterally. GI: Soft, nontender, nondistended. MS: No deformity or atrophy. Skin: Warm and dry, no rash. Neuro:  Strength and sensation are intact. Psych: Normal affect.  Assessment & Plan    Aorta and aortic root dilation -01/2022 echo aortic root 46 mm and ascending aorta 44 mm.  TEE 09/04/22 ascending aorta 9mm. Consider repeat imaging in one year 09/2023 - can be coordinated at follow up. Continue optimal blood pressure /control, metoprolol.   Avoid fluoroquinolones.  CAD / HLD - Minimal nonobstructive disease by cardiac CTA 05/2021. Continue aspirin, metoprolol, rosuvastatin. Stable with no anginal symptoms. No indication for ischemic evaluation. Heart healthy diet and regular cardiovascular exercise encouraged.    HTN - Relatively hypotensive today in clinic though asymptomatic. Continue Metoprolol.  Atrial flutter / hpercoagulable state -s/p DCCV 12/2020 and 08/2022.  He is overall asymptomatic with no palpitations however previously did test for reduction of LVEF to less than 20% with atrial flutter.   Prior sleep study no OSA. We discussed AAD.  No Flecainide due to CAD and hesitant to use Amiodarone given young age. Will refer to atrial fibrillation clinic/EP for discussion of AAD and/or ablation.   Increase metoprolol succinate to 37.5 mg daily.  Careful titration given relative hypotension.   Continue Eliquis 5mg  BID. He is not certain if he has missed dose in last 3 weeks, discussed TEE/DCCV but as he is overall asymptomatic at this time will proceed with EP referral first. CHA2DS2-VASc Score = 4 [CHF History: 1, HTN History: 1, Diabetes History: 0, Stroke History: 0, Vascular Disease History: 1, Age Score: 1, Gender Score: 0].  Therefore, the patient's annual risk of stroke is 4.8 %.   Does not meet dose reduction criteria. Denies bleeding complications.  Thyroid panel, mag, CBC, BMP today.   HFrEF - EF previously reduced <20% at time of previous atrial flutter 12/2020. With recent recurrent of atrial flutter, EF 45% via TEE 08/2022.Euvolemic on exam with no new dyspnea, edema. Relative hypotension precludes addition of ACE/ARB/ARNI.  No indication for loop diuretic at this time.    Disposition: Follow up with Atrial Fib Clinic/EP and as scheduled with Joseph Latch, MD or APP.  Signed, Loel Dubonnet, NP 12/23/2022, 8:50 AM [place]

## 2022-12-23 NOTE — Patient Instructions (Addendum)
Medication Instructions:  Your physician has recommended you make the following change in your medication:   Increase: Metoprolol Succinate 37.5mg  (1.5 tablets) daily   *If you need a refill on your cardiac medications before your next appointment, please call your pharmacy*   Lab Work: Your physician recommends that you return for lab work Today- BMP, CBC, MAG, Thyroid Panel   If you have labs (blood work) drawn today and your tests are completely normal, you will receive your results only by: [place] (if you have MyChart) OR A paper copy in the mail If you have any lab test that is abnormal or we need to change your treatment, we will call you to review the results.  Follow-Up: At [facility], you and your health needs are our priority.  As part of our continuing mission to provide you with exceptional heart care, we have created designated Provider Care Teams.  These Care Teams include your primary Cardiologist (physician) and Advanced Practice Providers (APPs -  Physician Assistants and Nurse Practitioners) who all work together to provide you with the care you need, when you need it.  We recommend signing up for the patient portal called "MyChart".  Sign up information is provided on this After Visit Summary.  MyChart is used to connect with patients for Virtual Visits (Telemedicine).  Patients are able to view lab/test results, encounter notes, upcoming appointments, etc.  Non-urgent messages can be sent to your provider as well.   To learn more about what you can do with MyChart, go to [email or web address].    Your next appointment:   Follow up as scheduled with Dr. Oval Linsey   &  Follow up with either A. Fib Clinic or EP- whoever gets you in first!   Other Instructions We will get you in with either our [place] [facility] or Electrophysiology (heart specialist in electrical systems of the heart). They will discuss anti-arrhythmic medications versus  ablation. If you start to feel more symptomatic with fatigue, shortness of breath, or palpitations let us know and we can get you set up for cardioversion. For now, we will plan to get you into the specialists above to see if we can get a more permanent solution for your atrial flutter/fibrillation.

## 2022-12-24 LAB — THYROID PANEL WITH TSH
Free Thyroxine Index: 1.7 (ref 1.2–4.9)
T3 Uptake Ratio: 28 % (ref 24–39)
T4, Total: 6.2 ug/dL (ref 4.5–12.0)
TSH: 1.22 u[IU]/mL (ref 0.450–4.500)

## 2022-12-24 LAB — CBC
Hematocrit: 43.5 % (ref 37.5–51.0)
Hemoglobin: 14.4 g/dL (ref 13.0–17.7)
MCH: 30.9 pg (ref 26.6–33.0)
MCHC: 33.1 g/dL (ref 31.5–35.7)
MCV: 93 fL (ref 79–97)
Platelets: 198 10*3/uL (ref 150–450)
RBC: 4.66 x10E6/uL (ref 4.14–5.80)
RDW: 12.2 % (ref 11.6–15.4)
WBC: 6.2 10*3/uL (ref 3.4–10.8)

## 2022-12-24 LAB — BASIC METABOLIC PANEL
BUN/Creatinine Ratio: 15 (ref 10–24)
BUN: 19 mg/dL (ref 8–27)
CO2: 25 mmol/L (ref 20–29)
Calcium: 9.6 mg/dL (ref 8.6–10.2)
Chloride: 103 mmol/L (ref 96–106)
Creatinine, Ser: 1.3 mg/dL — ABNORMAL HIGH (ref 0.76–1.27)
Glucose: 95 mg/dL (ref 70–99)
Potassium: 4.5 mmol/L (ref 3.5–5.2)
Sodium: 139 mmol/L (ref 134–144)
eGFR: 59 mL/min/{1.73_m2} — ABNORMAL LOW (ref >59)

## 2022-12-24 LAB — MAGNESIUM: Magnesium: 2.2 mg/dL (ref 1.6–2.3)

## 2023-01-12 ENCOUNTER — Ambulatory Visit (HOSPITAL_COMMUNITY)
Admission: RE | Admit: 2023-01-12 | Discharge: 2023-01-12 | Disposition: A | Discharge status: Home / Self Care | Payer: PPO | Source: Ambulatory Visit | Attending: Physician Assistant | Admitting: Physician Assistant

## 2023-01-12 ENCOUNTER — Encounter (HOSPITAL_COMMUNITY): Payer: Self-pay | Admitting: Physician Assistant

## 2023-01-12 VITALS — BP 132/102 | HR 89 | Ht 72.0 in | Wt 239.8 lb

## 2023-01-12 DIAGNOSIS — J45909 Unspecified asthma, uncomplicated: Secondary | ICD-10-CM | POA: Diagnosis not present

## 2023-01-12 DIAGNOSIS — I110 Hypertensive heart disease with heart failure: Secondary | ICD-10-CM | POA: Diagnosis not present

## 2023-01-12 DIAGNOSIS — I5020 Unspecified systolic (congestive) heart failure: Secondary | ICD-10-CM | POA: Insufficient documentation

## 2023-01-12 DIAGNOSIS — D6869 Other thrombophilia: Secondary | ICD-10-CM | POA: Insufficient documentation

## 2023-01-12 DIAGNOSIS — E785 Hyperlipidemia, unspecified: Secondary | ICD-10-CM | POA: Insufficient documentation

## 2023-01-12 DIAGNOSIS — I483 Typical atrial flutter: Secondary | ICD-10-CM

## 2023-01-12 DIAGNOSIS — I2510 Atherosclerotic heart disease of native coronary artery without angina pectoris: Secondary | ICD-10-CM | POA: Insufficient documentation

## 2023-01-12 DIAGNOSIS — I4892 Unspecified atrial flutter: Secondary | ICD-10-CM | POA: Insufficient documentation

## 2023-01-12 NOTE — Progress Notes (Signed)
Primary Care Physician: Biagio Borg, MD Primary Cardiologist: Dr Oval Linsey Primary Electrophysiologist: none Referring Physician: [facility]   [name] is a 72 y.o. male with a history of hypertension, hyperlipidemia, impaired glucose tolerance, asthma, DVT following knee replacement, CHF with improved EF, atrial flutter who presents for follow up in the [facility]. The patient was initially diagnosed with atrial flutter 12/22/20 after presenting to the ED with symptoms of progressive SOB and elevated heart rates. ECG showed rapid atrial flutter. He was started on Eliquis for a CHADS2VASC score of 4 and metoprolol for rate control. TEE/DCCV 12/2020.  Echo LVEF less than 20%, global hypokinesis, secundum ASD.  Repeat echo revealed LVEF improved to 50%. Coronary CTA 05/2021 minimal noncalcified plaque as well as aortic atherosclerosis. Echocardiogram 01/03/2022 with normal LVEF 55 to 60%. Seen 08/26/22 in atrial flutter. 09/04/22 underwent TEE/DCCV . He was found to be back in atrial flutter at his follow up on 12/23/22. His BB was increased at that time.  On follow up today, patient remains in atrial flutter. His heart rates are better controlled on the higher dose of BB. He does have fatigue with exertion. No bleeding issues on anticoagulation.   Today, he denies symptoms of palpitations, chest pain, shortness of breath, orthopnea, PND, lower extremity edema, dizziness, presyncope, syncope, snoring, daytime somnolence, bleeding, or neurologic sequela. The patient is tolerating medications without difficulties and is otherwise without complaint today.    Atrial Fibrillation Risk Factors:  he does not have symptoms or diagnosis of sleep apnea. Negative sleep study. he does not have a history of rheumatic fever. he does not have a history of alcohol use. The patient does not have a history of early familial atrial fibrillation or other arrhythmias.  he has a BMI  of Body mass index is 32.52 kg/m.Marland Kitchen Filed Weights   01/12/23 0853  Weight: 108.8 kg    Family History  Problem Relation Age of Onset   Lupus Father    Cerebral palsy Sister    Diabetes Maternal Grandmother      Atrial Fibrillation Management history:  Previous antiarrhythmic drugs: none Previous cardioversions: 12/2020, 09/04/22 Previous ablations: none CHADS2VASC score: 4 Anticoagulation history: Eliquis   Past Medical History:  Diagnosis Date   ALLERGIC RHINITIS 12/31/2007   Aortic atherosclerosis ([place]) 08/26/2022   Arthritis    Chronic combined systolic and diastolic heart failure ([place]) 06/14/2021   COUGH, CHRONIC 07/18/2007   DEGENERATIVE JOINT DISEASE 07/18/2007   Extrinsic asthma, unspecified 09/08/2009   GERD 07/18/2007   HYPERLIPIDEMIA 08/14/2010   Hypertension    Impaired glucose tolerance 09/23/2011   OBESITY 07/18/2007   Pneumonia    Past Surgical History:  Procedure Laterality Date   CARDIOVERSION N/A 12/28/2020   Procedure: CARDIOVERSION;  Surgeon: Skeet Latch, MD;  Location: [facility];  Service: Cardiovascular;  Laterality: N/A;   CARDIOVERSION N/A 09/04/2022   Procedure: CARDIOVERSION;  Surgeon: Jerline Pain, MD;  Location: [facility];  Service: Cardiovascular;  Laterality: N/A;   DG FEMUR LEFT  ([place] [facility])     rod placed   LUMBAR [place]     TEE WITHOUT CARDIOVERSION N/A 12/28/2020   Procedure: TRANSESOPHAGEAL ECHOCARDIOGRAM (TEE);  Surgeon: Skeet Latch, MD;  Location: [facility];  Service: Cardiovascular;  Laterality: N/A;   TEE WITHOUT CARDIOVERSION N/A 09/04/2022   Procedure: TRANSESOPHAGEAL ECHOCARDIOGRAM (TEE);  Surgeon: Jerline Pain, MD;  Location[facility] ENDOSCOPY;  Service: Cardiovascular;  Laterality: N/A;   TOOTH EXTRACTION  TOTAL KNEE ARTHROPLASTY Right 07/26/2018   Procedure: RIGHT TOTAL KNEE ARTHROPLASTY;  Surgeon: Jessy Oto, MD;  Location: [facility];  Service: Orthopedics;  Laterality: Right;    Current  Outpatient Medications  Medication Sig Dispense Refill   acetaminophen (TYLENOL) 500 MG tablet Take 500 mg by mouth every morning.     albuterol (PROAIR HFA) 108 (90 Base) MCG/ACT inhaler Inhale 2 puffs into the lungs every 6 (six) hours as needed for wheezing or shortness of breath. 24 g 3   apixaban (ELIQUIS) 5 MG TABS tablet Take 1 tablet (5 mg total) by mouth 2 (two) times daily. 180 tablet 1   budesonide-formoterol (SYMBICORT) 80-4.5 MCG/ACT inhaler Inhale 2 puffs into the lungs 2 (two) times daily. (Patient taking differently: Inhale 2 puffs into the lungs 2 (two) times daily as needed (Wheezing / SOB).) 3 each 3   esomeprazole (NEXIUM) 40 MG capsule Take 1 capsule (40 mg total) by mouth daily at 12 noon. Take 1 Daily (Patient taking differently: Take 40 mg by mouth daily as needed (acid reflux).) 90 capsule 3   gabapentin (NEURONTIN) 300 MG capsule Take 1 capsule (300 mg total) by mouth at bedtime. 1 capsule at bedtime 180 capsule 3   Melatonin 10 MG TABS Take 10 mg by mouth at bedtime as needed (sleep).     metoprolol succinate (TOPROL-XL) 25 MG 24 hr tablet Take 1.5 tablets (37.5 mg total) by mouth daily. 135 tablet 3   Multiple Vitamins-Minerals (CENTRUM ADULTS PO) Take 1 tablet by mouth daily.     rosuvastatin (CRESTOR) 20 MG tablet Take 1 tablet (20 mg total) by mouth every evening. 90 tablet 1   No current facility-administered medications for this encounter.    No Known Allergies  Social History   Socioeconomic History   Marital status: Married    Spouse name: Not on file   Number of children: 1   Years of education: Not on file   Highest education level: Not on file  Occupational History   Occupation: truck driver  Tobacco Use   Smoking status: Never   Smokeless tobacco: Never   Tobacco comments:    Marijuana 30 years  Vaping Use   Vaping Use: Never used  Substance and Sexual Activity   Alcohol use: Yes    Alcohol/week: 2.0 - 4.0 standard drinks of alcohol     Types: 2 - 4 Cans of beer per week    Comment: moderate   Drug use: Not Currently    Types: Marijuana    Comment: as a teenager   Sexual activity: Not on file  Other Topics Concern   Not on file  Social History Narrative   Not on file   Social Determinants of Health   Financial Resource Strain: Not on file  Food Insecurity: Not on file  Transportation Needs: Not on file  Physical Activity: Not on file  Stress: Not on file  Social Connections: Not on file  Intimate Partner Violence: Not on file     ROS- All systems are reviewed and negative except as per the HPI above.  Physical Exam: Vitals:   01/12/23 0853  BP: (!) 132/102  Pulse: 89  Weight: 108.8 kg  Height: 6' (1.829 m)     GEN- The patient is a well appearing male, alert and oriented x 3 today.   HEENT-head normocephalic, atraumatic, sclera clear, conjunctiva pink, hearing intact, trachea midline. Lungs- Clear to ausculation bilaterally, normal work of breathing Heart- irregular rate and rhythm,  no murmurs, rubs or gallops  GI- soft, NT, ND, + BS Extremities- no clubbing, cyanosis, or edema MS- no significant deformity or atrophy Skin- no rash or lesion Psych- euthymic mood, full affect Neuro- strength and sensation are intact   Wt Readings from Last 3 Encounters:  01/12/23 108.8 kg  12/23/22 109.3 kg  09/19/22 107.5 kg    EKG today demonstrates  Typical atrial flutter with variable block Vent. rate 92 BPM PR interval * ms QRS duration 82 ms QT/QTcB 392/484 ms  Echo 01/07/21  1. Left ventricular ejection fraction, by estimation, is 50%. The left  ventricle has mildly decreased function. The left ventricle demonstrates  global hypokinesis. Left ventricular diastolic parameters are consistent  with Grade I diastolic dysfunction (impaired relaxation).   2. Right ventricular systolic function is normal. The right ventricular  size is normal. Tricuspid regurgitation signal is inadequate for assessing   PA pressure.   3. Left atrial size was mildly dilated.   4. Right atrial size was mildly dilated.   5. The mitral valve is normal in structure. No evidence of mitral valve  regurgitation. No evidence of mitral stenosis.   6. The aortic valve is tricuspid. Aortic valve regurgitation is not  visualized. No aortic stenosis is present.   7. Aortic dilatation noted. There is moderate dilatation of the aortic  root, measuring 45 mm.   8. The inferior vena cava is normal in size with greater than 50%  respiratory variability, suggesting right atrial pressure of 3 mmHg.   Epic records are reviewed at length today  CHA2DS2-VASc Score = 4  The patient's score is based upon: CHF History: 1 HTN History: 1 Diabetes History: 0 Stroke History: 0 Vascular Disease History: 1 Age Score: 1 Gender Score: 0      ASSESSMENT AND PLAN: 1. Atrial flutter The patient's CHA2DS2-VASc score is 4, indicating a 4.8% annual risk of stroke.   No afib documented on any ECGs.  We discussed rhythm control options today including AAD and ablation. Patient would like to avoid long term medications, will refer to EP to discuss ablation. Would like to avoid class IC and Multaq with variable EF (most recently 45% on TEE) Continue Eliquis 5 mg BID Continue Toprol 37.5 mg daily  2. Secondary Hypercoagulable State (ICD10:  D68.69) The patient is at significant risk for stroke/thromboembolism based upon his CHA2DS2-VASc Score of 4.  Continue Apixaban (Eliquis).   3. HFrEF TEE EF 45% EF previously recovered with restoration of SR. Fluid status appears stable today.  4. HTN Stable, no changes today.  5. CAD/aortic atherosclerosis Minimal nonobstructive disease on CTA On statin No anginal symptoms.   Follow up with EP to discuss ablation.    [facility] [address] [place], [place] [address] [phone] 01/12/2023 9:33 AM

## 2023-01-20 ENCOUNTER — Encounter (HOSPITAL_BASED_OUTPATIENT_CLINIC_OR_DEPARTMENT_OTHER): Payer: Self-pay | Admitting: Cardiovascular Disease

## 2023-01-20 ENCOUNTER — Ambulatory Visit: Payer: PPO | Attending: Cardiovascular Disease

## 2023-01-20 ENCOUNTER — Ambulatory Visit (INDEPENDENT_AMBULATORY_CARE_PROVIDER_SITE_OTHER): Payer: PPO | Admitting: Cardiovascular Disease

## 2023-01-20 VITALS — BP 124/82 | HR 63 | Ht 72.0 in | Wt 242.4 lb

## 2023-01-20 DIAGNOSIS — I4892 Unspecified atrial flutter: Secondary | ICD-10-CM

## 2023-01-20 DIAGNOSIS — I10 Essential (primary) hypertension: Secondary | ICD-10-CM | POA: Diagnosis not present

## 2023-01-20 DIAGNOSIS — Z01812 Encounter for preprocedural laboratory examination: Secondary | ICD-10-CM | POA: Diagnosis not present

## 2023-01-20 DIAGNOSIS — G629 Polyneuropathy, unspecified: Secondary | ICD-10-CM

## 2023-01-20 DIAGNOSIS — I700 Atherosclerosis of aorta: Secondary | ICD-10-CM | POA: Diagnosis not present

## 2023-01-20 NOTE — Progress Notes (Unsigned)
Enrolled for [employer] to mail a ZIO XT long term holter monitor to the patients address on file.  

## 2023-01-20 NOTE — Patient Instructions (Signed)
Medication Instructions:  Your physician recommends that you continue on your current medications as directed. Please refer to the Current Medication list given to you today.   Labwork: BMET/CBC 1 WEEK PRIOR TO CARDIOVERSION   Testing/Procedures: YOU ARE SCHEDULED FOR CARDIOVERSION 02/05/2023 AT 11  14 DAY ZIO, THIS WILL BE MAILED TO YOU. APPLY AFTER CARDIOVERSION   Follow-Up: 03/05/2023 10:00 AM WITH DR [facility]   Any Other Special Instructions Will Be Listed Below (If Applicable).    You are scheduled for a Cardioversion on Thursday, March 7 with Dr. Gasper Sells.  Please arrive at the [facility] (Main Entrance A) at [facility]: [address] [place], [place] [address] at 10:00 AM.    DIET:  Nothing to eat or drink after midnight except a sip of water with medications (see medication instructions below)  MEDICATION INSTRUCTIONS Continue taking your anticoagulant (blood thinner): Apixaban (Eliquis).  You will need to continue this after your procedure until you are told by your provider that it is safe to stop.    LABS:  Come to [facility] 1 WEEK PRIOR TO CARDIOVERSION   FYI:  For your safety, and to allow [place] to monitor your vital signs accurately during the surgery/procedure we request: If you have artificial nails, gel coating, SNS etc, please have those removed prior to your surgery/procedure. Not having the nail coverings /polish removed may result in cancellation or delay of your surgery/procedure.  You must have a responsible person to drive you home and stay in the waiting area during your procedure. Failure to do so could result in cancellation.  Bring your insurance cards.  *Special Note: Every effort is made to have your procedure done on time. Occasionally there are emergencies that occur at the hospital that may cause delays. Please be patient if a delay does occur.   ZIO XT- Long Term Monitor Instructions  Your physician has requested you  wear a ZIO patch monitor for 14 days.  This is a single patch monitor. [employer] supplies one patch monitor per enrollment. Additional stickers are not available. Please do not apply patch if you will be having a Nuclear Stress Test,  Echocardiogram, Cardiac CT, MRI, or Chest Xray during the period you would be wearing the  monitor. The patch cannot be worn during these tests. You cannot remove and re-apply the  ZIO XT patch monitor.  Your ZIO patch monitor will be mailed 3 day USPS to your address on file. It may take 3-5 days  to receive your monitor after you have been enrolled.  Once you have received your monitor, please review the enclosed instructions. Your monitor  has already been registered assigning a specific monitor serial # to you.  Billing and Patient Assistance Program Information  We have supplied [employer] with any of your insurance information on file for billing purposes. [employer] offers a sliding scale Patient Assistance Program for patients that do not have  insurance, or whose insurance does not completely cover the cost of the ZIO monitor.  You must apply for the Patient Assistance Program to qualify for this discounted rate.  To apply, please call [employer] at [phone], select option 4, select option 2, ask to apply for  Patient Assistance Program. Theodore Demark will ask your household income, and how many people  are in your household. They will quote your out-of-pocket cost based on that information.  [employer] will also be able to set up a 42-month interest-free payment plan if needed.  Applying the  monitor   Shave hair from upper left chest.  Hold abrader disc by orange tab. Rub abrader in 40 strokes over the upper left chest as  indicated in your monitor instructions.  Clean area with 4 enclosed alcohol pads. Let dry.  Apply patch as indicated in monitor instructions. Patch will be placed under collarbone on left  side of chest with arrow pointing upward.  Rub patch  adhesive wings for 2 minutes. Remove white label marked "1". Remove the white  label marked "2". Rub patch adhesive wings for 2 additional minutes.  While looking in a mirror, press and release button in center of patch. A small green light will  flash 3-4 times. This will be your only indicator that the monitor has been turned on.  Do not shower for the first 24 hours. You may shower after the first 24 hours.  Press the button if you feel a symptom. You will hear a small click. Record Date, Time and  Symptom in the Patient Logbook.  When you are ready to remove the patch, follow instructions on the last 2 pages of Patient  Logbook. Stick patch monitor onto the last page of Patient Logbook.  Place Patient Logbook in the blue and white box. Use locking tab on box and tape box closed  securely. The blue and white box has prepaid postage on it. Please place it in the mailbox as  soon as possible. Your physician should have your test results approximately 7 days after the  monitor has been mailed back to [facility].  Call [place] at [phone] if you have questions regarding  your ZIO XT patch monitor. Call them immediately if you see an orange light blinking on your  monitor.  If your monitor falls off in less than 4 days, contact our Monitor department at [phone].  If your monitor becomes loose or falls off after 4 days call [employer] at [phone] for  suggestions on securing your monitor

## 2023-01-20 NOTE — Progress Notes (Signed)
Cardiology Office Note:    Date:  01/20/2023   ID:  Joseph, [name] October 24, 1951, MRN [id number]  PCP:  Biagio Borg, MD   [place] Providers Cardiologist:  Skeet Latch, MD     Referring MD: Biagio Borg, MD   No chief complaint on file.  History of Present Illness:    [name] is a 72 y.o. male with a hx of aortic atherosclerosis, ascending aortic aneurysm, paroxysmal atrial fibrillation, chronic systolic diastolic heart failure (recovered LVEF), hyperlipidemia, hypertension, obesity, and GERD, here for follow-up. He was first seen in the Atrial fibrillation clinic 12/2020 for atrial flutter. He was started on eliquis and metoprolol. He had a TEE/DCCV 12/2020. The echo revealed LVEF less then 20% with global hypokinesis and a secundum ASD. He followed up with Beckie Busing, DNP and was referred for repeat Echo, where his LVEF had improved to 50%.  He reported exertional dyspnea, despite being in sinus rhythm. He had a coronary CTA 05/2021 that showed minimal non-calcified plaque. His calcium score was 0. He had aortic atherosclerosis, and his aorta was 3.9 cm. He had an Echo  01/2022 which revealed LVEF 55-60%, mild LVH, and grade 1 diastolic dysfunction. The aortic root was 4.6 cm, and the ascending aorta was 4.4 cm. He saw Joseph Montana, NP 01/2022 and was feeling well.   At his visit 08/2022 he was in atrial flutter and was feeling tired. His rate was controlled on metoprolol. He was started on Eliquis and referred for TEE/DCCV 08/2022. LVEF was 60-65%; ascending aorta was 4.5 cm and he was found to have a small PFO. He was successfully cardioverted. He had a sleep study that was negative for OSA. He's had recurrent issues with PAF and was seen in Afib clinic 01/2023 and was referred to EP to discuss ablation.  Today, he complains of frequent fatigue and occasional shortness of breath after activity. He is able to force himself to complete activities, but generally he  has a lack of energy. No chest pain or heart racing. After his previous cardioversion he didn't feel very different. He is not sure how long he was in sinus rhythm following his cardioversion. He has tried using a Psychologist, sport and exercise which has not worked for him. Additionally he complains of numbness in his bilateral feet. No pain in his BLE aside from his right knee which he attributes to a prior right total knee arthroplasty. Since he has retired he has been able to sleep more than he used to, but he may still have trouble sleeping at times. He had a prior sleep study with mild snoring and lowest oxygen saturation at 88% for a brief time. He denies any peripheral edema, lightheadedness, headaches, syncope, orthopnea, or PND.   Past Medical History:  Diagnosis Date   ALLERGIC RHINITIS 12/31/2007   Aortic atherosclerosis ([place]) 08/26/2022   Arthritis    Chronic combined systolic and diastolic heart failure ([place]) 06/14/2021   COUGH, CHRONIC 07/18/2007   DEGENERATIVE JOINT DISEASE 07/18/2007   Extrinsic asthma, unspecified 09/08/2009   GERD 07/18/2007   HYPERLIPIDEMIA 08/14/2010   Hypertension    Impaired glucose tolerance 09/23/2011   OBESITY 07/18/2007   Pneumonia     Past Surgical History:  Procedure Laterality Date   CARDIOVERSION N/A 12/28/2020   Procedure: CARDIOVERSION;  Surgeon: Skeet Latch, MD;  Location: [place];  Service: Cardiovascular;  Laterality: N/A;   CARDIOVERSION N/A 09/04/2022   Procedure: CARDIOVERSION;  Surgeon: Jerline Pain, MD;  Location: [facility]  ENDOSCOPY;  Service: Cardiovascular;  Laterality: N/A;   DG FEMUR LEFT  ([place] HX)     rod placed   LUMBAR [place]     TEE WITHOUT CARDIOVERSION N/A 12/28/2020   Procedure: TRANSESOPHAGEAL ECHOCARDIOGRAM (TEE);  Surgeon: Skeet Latch, MD;  Location: [place];  Service: Cardiovascular;  Laterality: N/A;   TEE WITHOUT CARDIOVERSION N/A 09/04/2022   Procedure: TRANSESOPHAGEAL ECHOCARDIOGRAM (TEE);  Surgeon:  Jerline Pain, MD;  Location: [facility] ENDOSCOPY;  Service: Cardiovascular;  Laterality: N/A;   TOOTH EXTRACTION     TOTAL KNEE ARTHROPLASTY Right 07/26/2018   Procedure: RIGHT TOTAL KNEE ARTHROPLASTY;  Surgeon: Jessy Oto, MD;  Location: [place];  Service: Orthopedics;  Laterality: Right;    Current Medications: Current Meds  Medication Sig   acetaminophen (TYLENOL) 500 MG tablet Take 500 mg by mouth every morning.   albuterol (PROAIR HFA) 108 (90 Base) MCG/ACT inhaler Inhale 2 puffs into the lungs every 6 (six) hours as needed for wheezing or shortness of breath.   apixaban (ELIQUIS) 5 MG TABS tablet Take 1 tablet (5 mg total) by mouth 2 (two) times daily.   budesonide-formoterol (SYMBICORT) 80-4.5 MCG/ACT inhaler Inhale 2 puffs into the lungs 2 (two) times daily. (Patient taking differently: Inhale 2 puffs into the lungs 2 (two) times daily as needed (Wheezing / SOB).)   esomeprazole (NEXIUM) 40 MG capsule Take 1 capsule (40 mg total) by mouth daily at 12 noon. Take 1 Daily (Patient taking differently: Take 40 mg by mouth daily as needed (acid reflux).)   gabapentin (NEURONTIN) 300 MG capsule Take 1 capsule (300 mg total) by mouth at bedtime. 1 capsule at bedtime   Melatonin 10 MG TABS Take 10 mg by mouth at bedtime as needed (sleep).   metoprolol succinate (TOPROL-XL) 25 MG 24 hr tablet Take 1.5 tablets (37.5 mg total) by mouth daily.   Multiple Vitamins-Minerals (CENTRUM ADULTS PO) Take 1 tablet by mouth daily.   rosuvastatin (CRESTOR) 20 MG tablet Take 1 tablet (20 mg total) by mouth every evening.     Allergies:   Patient has no known allergies.   Social History   Socioeconomic History   Marital status: Married    Spouse name: Not on file   Number of children: 1   Years of education: Not on file   Highest education level: Not on file  Occupational History   Occupation: truck driver  Tobacco Use   Smoking status: Never   Smokeless tobacco: Never   Tobacco comments:     Marijuana 30 years  Vaping Use   Vaping Use: Never used  Substance and Sexual Activity   Alcohol use: Yes    Alcohol/week: 2.0 - 4.0 standard drinks of alcohol    Types: 2 - 4 Cans of beer per week    Comment: moderate   Drug use: Not Currently    Types: Marijuana    Comment: as a teenager   Sexual activity: Not on file  Other Topics Concern   Not on file  Social History Narrative   Not on file   Social Determinants of Health   Financial Resource Strain: Not on file  Food Insecurity: Not on file  Transportation Needs: Not on file  Physical Activity: Not on file  Stress: Not on file  Social Connections: Not on file     Family History: The patient's family history includes Cerebral palsy in his sister; Diabetes in his maternal grandmother; Lupus in his father.  ROS:   Please  see the history of present illness.    (+) Fatigue/Malaise (+) Shortness of breath (+) Insomnia (+) Numbness of bilateral feet (+) Right knee pain All other systems reviewed and are negative.  EKGs/Labs/Other Studies Reviewed:    The following studies were reviewed today:  Echo  09/04/2022:  1. PFO 0.3 cm.   2. Left ventricular ejection fraction, by estimation, is 60 to 65%. The  left ventricle has normal function. The left ventricle has no regional  wall motion abnormalities.   3. Right ventricular systolic function is normal. The right ventricular  size is normal.   4. No left atrial/left atrial appendage thrombus was detected.   5. The mitral valve is normal in structure. Trivial mitral valve  regurgitation. No evidence of mitral stenosis.   6. The aortic valve is normal in structure. Aortic valve regurgitation is  not visualized. No aortic stenosis is present.   7. Aortic dilatation noted. There is mild dilatation of the ascending  aorta, measuring 42 mm. There is mild dilatation of the aortic root,  measuring 45 mm.   8. The inferior vena cava is normal in size with greater than 50%   respiratory variability, suggesting right atrial pressure of 3 mmHg.   9. Evidence of atrial level shunting detected by color flow Doppler.  There is a moderately sized patent foramen ovale with predominantly left  to right shunting across the atrial septum.   Echo 01/03/2022:  1. Left ventricular ejection fraction, by estimation, is 55 to 60%. The  left ventricle has normal function. The left ventricle has no regional  wall motion abnormalities. There is mild concentric left ventricular  hypertrophy of the septal segment. Left  ventricular diastolic parameters are consistent with Grade I diastolic  dysfunction (impaired relaxation). The average left ventricular global  longitudinal strain is 19.8 %. The global longitudinal strain is normal.   2. Right ventricular systolic function is normal. The right ventricular  size is normal. There is normal pulmonary artery systolic pressure.   3. Left atrial size was mildly dilated.   4. Right atrial size was mildly dilated.   5. The mitral valve is normal in structure. No evidence of mitral valve  regurgitation. No evidence of mitral stenosis.   6. The aortic valve is tricuspid. Aortic valve regurgitation is trivial.  No aortic stenosis is present.   7. Aortic dilatation noted. There is moderate dilatation of the aortic  root, measuring 46 mm. There is moderate dilatation of the ascending  aorta, measuring 44 mm.   8. The inferior vena cava is normal in size with greater than 50%  respiratory variability, suggesting right atrial pressure of 3 mmHg.   CT Chest  09/17/2021: IMPRESSION: 1. Stable small bilateral pulmonary nodules from baseline chest CT of 9 months ago, supporting a benign etiology. This appearance is almost certainly benign, and no dedicated follow-up is required if this patient is low risk for bronchogenic carcinoma (and has no known or suspected primary neoplasm). Non-contrast chest CT can be considered in 12 months if  patient is high-risk. This recommendation follows the consensus statement: Guidelines for Management of Incidental Pulmonary Nodules Detected on CT Images: From the Fleischner Society 2017; Radiology 2017; 284:228-243. 2. Mild Aortic Atherosclerosis (ICD10-I70.0) and Emphysema (ICD10-J43.9).  Coronary CTA  06/28/2021: IMPRESSION: 1. Significant motion artifact, but did not impair interpretation. Minimal non-calcified CAD, CADRADS = 1.   2. Coronary calcium score of 0. This was 0 percentile for age and sex matched control.   3.  Normal coronary origin with right dominance.   4. Aortic atherosclerosis, borderline dilated aorta to 39 mm a the mid-ascending point (level of the PA bifurcation).   5. Borderline dilated main pulmonary artery at 29 mm, suggestive of possible pulmonary hypertension.  Echo 01/07/2021:  1. Left ventricular ejection fraction, by estimation, is 50%. The left  ventricle has mildly decreased function. The left ventricle demonstrates  global hypokinesis. Left ventricular diastolic parameters are consistent  with Grade I diastolic dysfunction  (impaired relaxation).   2. Right ventricular systolic function is normal. The right ventricular  size is normal. Tricuspid regurgitation signal is inadequate for assessing  PA pressure.   3. Left atrial size was mildly dilated.   4. Right atrial size was mildly dilated.   5. The mitral valve is normal in structure. No evidence of mitral valve  regurgitation. No evidence of mitral stenosis.   6. The aortic valve is tricuspid. Aortic valve regurgitation is not  visualized. No aortic stenosis is present.   7. Aortic dilatation noted. There is moderate dilatation of the aortic  root, measuring 45 mm.   8. The inferior vena cava is normal in size with greater than 50%  respiratory variability, suggesting right atrial pressure of 3 mmHg.    EKG:  EKG is personally reviewed. 01/20/2023:  Atrial flutter. Rate 63 bpm. 12/23/2022  Joseph Montana, NP):  Atrial flutter 103 bpm with QTc 521  08/26/2022:  Atrial flutter with variable conduction. Rate 88 bpm. LAD. 06/14/2021: EKG was not ordered.  Recent Labs: 08/20/2022: ALT 20 12/23/2022: BUN 19; Creatinine, Ser 1.30; Hemoglobin 14.4; Magnesium 2.2; Platelets 198; Potassium 4.5; Sodium 139; TSH 1.220   Recent Lipid Panel    Component Value Date/Time   CHOL 132 08/20/2022 0827   TRIG 96 08/20/2022 0827   HDL 50 08/20/2022 0827   CHOLHDL 2.6 08/20/2022 0827   CHOLHDL 3.7 06/15/2020 1602   VLDL 22.6 02/10/2019 1640   LDLCALC 64 08/20/2022 0827   LDLCALC 109 (H) 06/15/2020 1602   LDLDIRECT 132.9 09/23/2011 1557     Risk Assessment/Calculations:    This patients CHA2DS2-VASc Score and unadjusted Ischemic Stroke Rate (% per year) is equal to 2.2 % stroke rate/year from a score of 2  Above score calculated as 1 point each if present [CHF, HTN, DM, Vascular=MI/PAD/Aortic Plaque, Age if 64-74, or Male] Above score calculated as 2 points each if present [Age > 75, or Stroke/TIA/TE]   STOP-Bang Score:  4       Physical Exam:    VS:  BP 124/82 (BP Location: Left Arm, Patient Position: Sitting, Cuff Size: Normal)   Pulse 63   Ht 6' (1.829 m)   Wt 242 lb 6.4 oz (110 kg)   BMI 32.88 kg/m  , BMI Body mass index is 32.88 kg/m. GENERAL:  Well appearing HEENT: Pupils equal round and reactive, fundi not visualized, oral mucosa unremarkable NECK:  No jugular venous distention, waveform within normal limits, carotid upstroke brisk and symmetric, no bruits, no thyromegaly LUNGS:  Clear to auscultation bilaterally HEART:  Irregularly irregular.  PMI not displaced or sustained,S1 and S2 within normal limits, no S3, no S4, no clicks, no rubs, no murmurs ABD:  Flat, positive bowel sounds normal in frequency in pitch, no bruits, no rebound, no guarding, no midline pulsatile mass, no hepatomegaly, no splenomegaly EXT:  2 plus pulses throughout, no edema, no cyanosis no  clubbing SKIN:  No rashes no nodules NEURO:  Cranial nerves II through XII grossly intact,  motor grossly intact throughout PSYCH:  Cognitively intact, oriented to person place and time  ASSESSMENT:    1. Primary hypertension   2. Atrial flutter, unspecified type ([place])   3. Aortic atherosclerosis ([place])   4. Pre-procedure lab exam   5. Neuropathy     PLAN:    HTN (hypertension) BP well-controlled on metoprolol.   Atrial flutter [facility]) Mr. Angelini is in atrial flutter.  His rate is well-controlled on metoprolol.  He is consistent with taking his Eliquis.  His main complaint is fatigue.  It is unclear to me how much of his fatigue is due to the atrial flutter.  He is unsure how long he was in rhythm after his last cardioversion.  He is unsure whether he felt any different after the cardioversion.  We will plan to repeat his cardioversion and have him wear a 14-day Zio after the cardioversion.  This we will know definitively whether or not he is in rhythm.  Will also be able to determine whether or not his symptoms improve in sinus rhythm.  If they do not, I do not think that an ablation will be helpful.  If they do, then a rhythm control strategy makes good sense.  Continue Eliquis and metoprolol.  Thyroid function has been normal and he does not have OSA.  Shared Decision Making/Informed Consent The risks (stroke, cardiac arrhythmias rarely resulting in the need for a temporary or permanent pacemaker, skin irritation or burns and complications associated with conscious sedation including aspiration, arrhythmia, respiratory failure and death), benefits (restoration of normal sinus rhythm) and alternatives of a direct current cardioversion were explained in detail to Mr. Ratledge and he agrees to proceed.     Aortic atherosclerosis (HCC) Lipids are well-controlled on rosuvastatin.  Continue current dosing.  Encouraged him to work on increasing his exercise.   Disposition: FU with Sasha Rueth C.  Oval Linsey, MD, [facility] in 4-6 weeks.  Medication Adjustments/Labs and Tests Ordered: Current medicines are reviewed at length with the patient today.  Concerns regarding medicines are outlined above.   Orders Placed This Encounter  Procedures   CBC with Differential/Platelet   Basic metabolic panel   Ambulatory referral to Neurology   LONG TERM MONITOR (3-14 DAYS)   EKG 12-Lead   No orders of the defined types were placed in this encounter.  I,Mathew Stumpf,acting as a Education administrator for Skeet Latch, MD.,have documented all relevant documentation on the behalf of Skeet Latch, MD,as directed by  Skeet Latch, MD while in the presence of Skeet Latch, MD.  I, [place] Oval Linsey, MD have reviewed all documentation for this visit.  The documentation of the exam, diagnosis, procedures, and orders on 01/20/2023 are all accurate and complete.  Signed, Skeet Latch, MD  01/20/2023 6:01 PM    [facility]

## 2023-01-20 NOTE — Assessment & Plan Note (Addendum)
[name] is in atrial flutter.  His rate is well-controlled on metoprolol.  He is consistent with taking his Eliquis.  His main complaint is fatigue.  It is unclear to me how much of his fatigue is due to the atrial flutter.  He is unsure how long he was in rhythm after his last cardioversion.  He is unsure whether he felt any different after the cardioversion.  We will plan to repeat his cardioversion and have him wear a 14-day Zio after the cardioversion.  This we will know definitively whether or not he is in rhythm.  Will also be able to determine whether or not his symptoms improve in sinus rhythm.  If they do not, I do not think that an ablation will be helpful.  If they do, then a rhythm control strategy makes good sense.  Continue Eliquis and metoprolol.  Thyroid function has been normal and he does not have OSA.  Shared Decision Making/Informed Consent The risks (stroke, cardiac arrhythmias rarely resulting in the need for a temporary or permanent pacemaker, skin irritation or burns and complications associated with conscious sedation including aspiration, arrhythmia, respiratory failure and death), benefits (restoration of normal sinus rhythm) and alternatives of a direct current cardioversion were explained in detail to [name] and he agrees to proceed.

## 2023-01-20 NOTE — Assessment & Plan Note (Signed)
Lipids are well-controlled on rosuvastatin.  Continue current dosing.  Encouraged him to work on increasing his exercise.

## 2023-01-20 NOTE — Assessment & Plan Note (Signed)
BP well-controlled on metoprolol.

## 2023-01-21 ENCOUNTER — Telehealth (HOSPITAL_BASED_OUTPATIENT_CLINIC_OR_DEPARTMENT_OTHER): Payer: Self-pay | Admitting: Cardiovascular Disease

## 2023-01-21 NOTE — Telephone Encounter (Signed)
Patient is calling stating his wife received an email to opt into notifications for the [employer] heart monitor. They were both confused by this due to the patient being the one with the heart monitor ordered and the email we have on file for him not being his wife's. He reports she did not agree to it since it was not for her. Patient is requesting a callback to discuss how this happened as well as having it sent to his email instead. Please advise.

## 2023-01-21 NOTE — Telephone Encounter (Signed)
Explained to Mr. Meester that we are linked with the monitor company [employer] through [employer].  Once his monitor order was processed, his monitor order and demographics was shared with [employer] through electronic medical record integration.  It turns out his wife's cell phone number was listed as their home number.  [employer] sends out a message to the patients listed phone numbers by text, inviting them to download the [employer] App.   After discussing this with Mr. Gillyard, we reviewed his demographic information and will have the home number deleted and also delete the work numbers for both him and his wife since that information is outdated.

## 2023-01-26 ENCOUNTER — Institutional Professional Consult (permissible substitution): Payer: PPO | Admitting: Cardiology

## 2023-01-27 ENCOUNTER — Ambulatory Visit: Payer: PPO | Attending: Cardiology | Admitting: Cardiology

## 2023-01-27 ENCOUNTER — Encounter: Payer: Self-pay | Admitting: Cardiology

## 2023-01-27 VITALS — BP 108/66 | HR 69 | Ht 72.0 in | Wt 241.0 lb

## 2023-01-27 DIAGNOSIS — D6869 Other thrombophilia: Secondary | ICD-10-CM | POA: Diagnosis not present

## 2023-01-27 DIAGNOSIS — I483 Typical atrial flutter: Secondary | ICD-10-CM | POA: Diagnosis not present

## 2023-01-27 DIAGNOSIS — I10 Essential (primary) hypertension: Secondary | ICD-10-CM

## 2023-01-27 NOTE — H&P (View-Only) (Signed)
Electrophysiology Office Note   Date:  01/27/2023   ID:  [name], [name] 04-Aug-1951, MRN [id number]  PCP:  Biagio Borg, MD  Cardiologist:  Oval Linsey Primary Electrophysiologist:  Cecil Vandyke Meredith Leeds, MD    Chief Complaint: atrial flutter   History of Present Illness: [name] is a 72 y.o. male who is being seen today for the evaluation of atrial flutter at the request of Fenton, Clint R, PA. Presenting today for electrophysiology evaluation.  He has a history significant for aortic atherosclerosis with ascending aortic aneurysm, atrial flutter, chronic systolic and diastolic heart failure with recovered ejection fraction, hyperlipidemia, hypertension, obesity, GERD.  January 2022 he had an echo that showed an ejection fraction of less than 20%.  Ejection fraction is improved to 50%.  He has had recurrent episodes of atrial flutter.  He feels weak and fatigued when he is in his arrhythmia.  He has had multiple cardioversions and has continued to have episodes of atrial flutter.  His symptoms in atrial flutter weakness, fatigue, shortness of breath.  He feels well when he is in normal rhythm.  Today, he denies symptoms of palpitations, chest pain, shortness of breath, orthopnea, PND, lower extremity edema, claudication, dizziness, presyncope, syncope, bleeding, or neurologic sequela. The patient is tolerating medications without difficulties.    Past Medical History:  Diagnosis Date   ALLERGIC RHINITIS 12/31/2007   Aortic atherosclerosis ([place]) 08/26/2022   Arthritis    Chronic combined systolic and diastolic heart failure ([place]) 06/14/2021   COUGH, CHRONIC 07/18/2007   DEGENERATIVE JOINT DISEASE 07/18/2007   Extrinsic asthma, unspecified 09/08/2009   GERD 07/18/2007   HYPERLIPIDEMIA 08/14/2010   Hypertension    Impaired glucose tolerance 09/23/2011   OBESITY 07/18/2007   Pneumonia    Past Surgical History:  Procedure Laterality Date   CARDIOVERSION N/A 12/28/2020    Procedure: CARDIOVERSION;  Surgeon: Skeet Latch, MD;  Location: [place];  Service: Cardiovascular;  Laterality: N/A;   CARDIOVERSION N/A 09/04/2022   Procedure: CARDIOVERSION;  Surgeon: Jerline Pain, MD;  Location: [place];  Service: Cardiovascular;  Laterality: N/A;   DG FEMUR LEFT  ([place] HX)     rod placed   LUMBAR [place]     TEE WITHOUT CARDIOVERSION N/A 12/28/2020   Procedure: TRANSESOPHAGEAL ECHOCARDIOGRAM (TEE);  Surgeon: Skeet Latch, MD;  Location: [place];  Service: Cardiovascular;  Laterality: N/A;   TEE WITHOUT CARDIOVERSION N/A 09/04/2022   Procedure: TRANSESOPHAGEAL ECHOCARDIOGRAM (TEE);  Surgeon: Jerline Pain, MD;  Location: [facility];  Service: Cardiovascular;  Laterality: N/A;   TOOTH EXTRACTION     TOTAL KNEE ARTHROPLASTY Right 07/26/2018   Procedure: RIGHT TOTAL KNEE ARTHROPLASTY;  Surgeon: Jessy Oto, MD;  Location: [place];  Service: Orthopedics;  Laterality: Right;     Current Outpatient Medications  Medication Sig Dispense Refill   acetaminophen (TYLENOL) 500 MG tablet Take 500 mg by mouth every morning.     albuterol (PROAIR HFA) 108 (90 Base) MCG/ACT inhaler Inhale 2 puffs into the lungs every 6 (six) hours as needed for wheezing or shortness of breath. 24 g 3   apixaban (ELIQUIS) 5 MG TABS tablet Take 1 tablet (5 mg total) by mouth 2 (two) times daily. 180 tablet 1   budesonide-formoterol (SYMBICORT) 80-4.5 MCG/ACT inhaler Inhale 2 puffs into the lungs 2 (two) times daily. (Patient taking differently: Inhale 2 puffs into the lungs 2 (two) times daily as needed (Wheezing / SOB).) 3 each 3   esomeprazole (NEXIUM) 40  MG capsule Take 1 capsule (40 mg total) by mouth daily at 12 noon. Take 1 Daily (Patient taking differently: Take 40 mg by mouth daily as needed (acid reflux).) 90 capsule 3   gabapentin (NEURONTIN) 300 MG capsule Take 1 capsule (300 mg total) by mouth at bedtime. 1 capsule at bedtime 180 capsule 3   Melatonin 10 MG  TABS Take 10 mg by mouth at bedtime as needed (sleep).     metoprolol succinate (TOPROL-XL) 25 MG 24 hr tablet Take 1.5 tablets (37.5 mg total) by mouth daily. 135 tablet 3   Multiple Vitamins-Minerals (CENTRUM ADULTS PO) Take 1 tablet by mouth daily.     rosuvastatin (CRESTOR) 20 MG tablet Take 1 tablet (20 mg total) by mouth every evening. 90 tablet 1   No current facility-administered medications for this visit.    Allergies:   Patient has no known allergies.   Social History:  The patient  reports that he has never smoked. He has never used smokeless tobacco. He reports current alcohol use of about 2.0 - 4.0 standard drinks of alcohol per week. He reports that he does not currently use drugs after having used the following drugs: Marijuana.   Family History:  The patient's family history includes Cerebral palsy in his sister; Diabetes in his maternal grandmother; Lupus in his father.    ROS:  Please see the history of present illness.   Otherwise, review of systems is positive for none.   All other systems are reviewed and negative.    PHYSICAL EXAM: VS:  BP 108/66   Pulse 69   Ht 6' (1.829 m)   Wt 241 lb (109.3 kg)   SpO2 98%   BMI 32.69 kg/m  , BMI Body mass index is 32.69 kg/m. GEN: Well nourished, well developed, in no acute distress  HEENT: normal  Neck: no JVD, carotid bruits, or masses Cardiac: irregular; no murmurs, rubs, or gallops,no edema  Respiratory:  clear to auscultation bilaterally, normal work of breathing GI: soft, nontender, nondistended, + BS MS: no deformity or atrophy  Skin: warm and dry Neuro:  Strength and sensation are intact Psych: euthymic mood, full affect  EKG:  EKG is ordered today. Personal review of the ekg ordered shows atrial flutter  Recent Labs: 08/20/2022: ALT 20 12/23/2022: BUN 19; Creatinine, Ser 1.30; Hemoglobin 14.4; Magnesium 2.2; Platelets 198; Potassium 4.5; Sodium 139; TSH 1.220    Lipid Panel     Component Value Date/Time    CHOL 132 08/20/2022 0827   TRIG 96 08/20/2022 0827   HDL 50 08/20/2022 0827   CHOLHDL 2.6 08/20/2022 0827   CHOLHDL 3.7 06/15/2020 1602   VLDL 22.6 02/10/2019 1640   LDLCALC 64 08/20/2022 0827   LDLCALC 109 (H) 06/15/2020 1602   LDLDIRECT 132.9 09/23/2011 1557     Wt Readings from Last 3 Encounters:  01/27/23 241 lb (109.3 kg)  01/20/23 242 lb 6.4 oz (110 kg)  01/12/23 239 lb 12.8 oz (108.8 kg)      Other studies Reviewed: Additional studies/ records that were reviewed today include: TEE 09/04/22  Review of the above records today demonstrates:   1. PFO 0.3 cm.   2. Left ventricular ejection fraction, by estimation, is 60 to 65%. The  left ventricle has normal function. The left ventricle has no regional  wall motion abnormalities.   3. Right ventricular systolic function is normal. The right ventricular  size is normal.   4. No left atrial/left atrial appendage thrombus was detected.  5. The mitral valve is normal in structure. Trivial mitral valve  regurgitation. No evidence of mitral stenosis.   6. The aortic valve is normal in structure. Aortic valve regurgitation is  not visualized. No aortic stenosis is present.   7. Aortic dilatation noted. There is mild dilatation of the ascending  aorta, measuring 42 mm. There is mild dilatation of the aortic root,  measuring 45 mm.   8. The inferior vena cava is normal in size with greater than 50%  respiratory variability, suggesting right atrial pressure of 3 mmHg.   9. Evidence of atrial level shunting detected by color flow Doppler.  There is a moderately sized patent foramen ovale with predominantly left  to right shunting across the atrial septum.    ASSESSMENT AND PLAN:  1.  Typical atrial flutter: CHA2DS2-VASc of 4.  Currently on Eliquis and Toprol-XL.  At this point, he would prefer to avoid long-term medications.  Due to that, we Latanga Nedrow plan for ablation.  Risk and benefits have been discussed.  Risk of bleeding,  tamponade, heart block, stroke, damage surrounding organs, damage to vascular structures, death.  He understands these risks and is agreed to the procedure.  There are notes in epic that he has had atrial fibrillation, though I see no evidence of this on his ECGs.  Wyatt Galvan discuss with his primary cardiologist prior to ablation whether or not he has had any AF and if this needs to be ablated as well.  2.  Second hypercoagulable state: Currently on Eliquis for atrial flutter  3.  Hypertension: Currently well-controlled  4.  Aortic atherosclerosis: Statin per primary cardiology    Current medicines are reviewed at length with the patient today.   The patient does not have concerns regarding his medicines.  The following changes were made today:  none  Labs/ tests ordered today include:  Orders Placed This Encounter  Procedures   EKG 12-Lead     Disposition:   FU with Rafal Archuleta 3 months  Signed, Deitra Craine Meredith Leeds, MD  01/27/2023 9:21 AM     [address] [place] [address] [phone] (office) [phone] (fax)

## 2023-01-27 NOTE — Progress Notes (Signed)
Electrophysiology Office Note   Date:  01/27/2023   ID:  [name], [name] December 05, 1950, MRN [id number]  PCP:  Biagio Borg, MD  Cardiologist:  Oval Linsey Primary Electrophysiologist:  Sitara Cashwell Meredith Leeds, MD    Chief Complaint: atrial flutter   History of Present Illness: [name] is a 72 y.o. male who is being seen today for the evaluation of atrial flutter at the request of Fenton, Clint R, PA. Presenting today for electrophysiology evaluation.  He has a history significant for aortic atherosclerosis with ascending aortic aneurysm, atrial flutter, chronic systolic and diastolic heart failure with recovered ejection fraction, hyperlipidemia, hypertension, obesity, GERD.  January 2022 he had an echo that showed an ejection fraction of less than 20%.  Ejection fraction is improved to 50%.  He has had recurrent episodes of atrial flutter.  He feels weak and fatigued when he is in his arrhythmia.  He has had multiple cardioversions and has continued to have episodes of atrial flutter.  His symptoms in atrial flutter weakness, fatigue, shortness of breath.  He feels well when he is in normal rhythm.  Today, he denies symptoms of palpitations, chest pain, shortness of breath, orthopnea, PND, lower extremity edema, claudication, dizziness, presyncope, syncope, bleeding, or neurologic sequela. The patient is tolerating medications without difficulties.    Past Medical History:  Diagnosis Date   ALLERGIC RHINITIS 12/31/2007   Aortic atherosclerosis ([place]) 08/26/2022   Arthritis    Chronic combined systolic and diastolic heart failure ([place]) 06/14/2021   COUGH, CHRONIC 07/18/2007   DEGENERATIVE JOINT DISEASE 07/18/2007   Extrinsic asthma, unspecified 09/08/2009   GERD 07/18/2007   HYPERLIPIDEMIA 08/14/2010   Hypertension    Impaired glucose tolerance 09/23/2011   OBESITY 07/18/2007   Pneumonia    Past Surgical History:  Procedure Laterality Date   CARDIOVERSION N/A 12/28/2020    Procedure: CARDIOVERSION;  Surgeon: Skeet Latch, MD;  Location: [place];  Service: Cardiovascular;  Laterality: N/A;   CARDIOVERSION N/A 09/04/2022   Procedure: CARDIOVERSION;  Surgeon: Jerline Pain, MD;  Location: [place];  Service: Cardiovascular;  Laterality: N/A;   DG FEMUR LEFT  ([place] HX)     rod placed   LUMBAR [place]     TEE WITHOUT CARDIOVERSION N/A 12/28/2020   Procedure: TRANSESOPHAGEAL ECHOCARDIOGRAM (TEE);  Surgeon: Skeet Latch, MD;  Location: [place];  Service: Cardiovascular;  Laterality: N/A;   TEE WITHOUT CARDIOVERSION N/A 09/04/2022   Procedure: TRANSESOPHAGEAL ECHOCARDIOGRAM (TEE);  Surgeon: Jerline Pain, MD;  Location: [facility] ENDOSCOPY;  Service: Cardiovascular;  Laterality: N/A;   TOOTH EXTRACTION     TOTAL KNEE ARTHROPLASTY Right 07/26/2018   Procedure: RIGHT TOTAL KNEE ARTHROPLASTY;  Surgeon: Jessy Oto, MD;  Location: [place];  Service: Orthopedics;  Laterality: Right;     Current Outpatient Medications  Medication Sig Dispense Refill   acetaminophen (TYLENOL) 500 MG tablet Take 500 mg by mouth every morning.     albuterol (PROAIR HFA) 108 (90 Base) MCG/ACT inhaler Inhale 2 puffs into the lungs every 6 (six) hours as needed for wheezing or shortness of breath. 24 g 3   apixaban (ELIQUIS) 5 MG TABS tablet Take 1 tablet (5 mg total) by mouth 2 (two) times daily. 180 tablet 1   budesonide-formoterol (SYMBICORT) 80-4.5 MCG/ACT inhaler Inhale 2 puffs into the lungs 2 (two) times daily. (Patient taking differently: Inhale 2 puffs into the lungs 2 (two) times daily as needed (Wheezing / SOB).) 3 each 3   esomeprazole (NEXIUM) 40  MG capsule Take 1 capsule (40 mg total) by mouth daily at 12 noon. Take 1 Daily (Patient taking differently: Take 40 mg by mouth daily as needed (acid reflux).) 90 capsule 3   gabapentin (NEURONTIN) 300 MG capsule Take 1 capsule (300 mg total) by mouth at bedtime. 1 capsule at bedtime 180 capsule 3   Melatonin 10 MG  TABS Take 10 mg by mouth at bedtime as needed (sleep).     metoprolol succinate (TOPROL-XL) 25 MG 24 hr tablet Take 1.5 tablets (37.5 mg total) by mouth daily. 135 tablet 3   Multiple Vitamins-Minerals (CENTRUM ADULTS PO) Take 1 tablet by mouth daily.     rosuvastatin (CRESTOR) 20 MG tablet Take 1 tablet (20 mg total) by mouth every evening. 90 tablet 1   No current facility-administered medications for this visit.    Allergies:   Patient has no known allergies.   Social History:  The patient  reports that he has never smoked. He has never used smokeless tobacco. He reports current alcohol use of about 2.0 - 4.0 standard drinks of alcohol per week. He reports that he does not currently use drugs after having used the following drugs: Marijuana.   Family History:  The patient's family history includes Cerebral palsy in his sister; Diabetes in his maternal grandmother; Lupus in his father.    ROS:  Please see the history of present illness.   Otherwise, review of systems is positive for none.   All other systems are reviewed and negative.    PHYSICAL EXAM: VS:  BP 108/66   Pulse 69   Ht 6' (1.829 m)   Wt 241 lb (109.3 kg)   SpO2 98%   BMI 32.69 kg/m  , BMI Body mass index is 32.69 kg/m. GEN: Well nourished, well developed, in no acute distress  HEENT: normal  Neck: no JVD, carotid bruits, or masses Cardiac: irregular; no murmurs, rubs, or gallops,no edema  Respiratory:  clear to auscultation bilaterally, normal work of breathing GI: soft, nontender, nondistended, + BS MS: no deformity or atrophy  Skin: warm and dry Neuro:  Strength and sensation are intact Psych: euthymic mood, full affect  EKG:  EKG is ordered today. Personal review of the ekg ordered shows atrial flutter  Recent Labs: 08/20/2022: ALT 20 12/23/2022: BUN 19; Creatinine, Ser 1.30; Hemoglobin 14.4; Magnesium 2.2; Platelets 198; Potassium 4.5; Sodium 139; TSH 1.220    Lipid Panel     Component Value Date/Time    CHOL 132 08/20/2022 0827   TRIG 96 08/20/2022 0827   HDL 50 08/20/2022 0827   CHOLHDL 2.6 08/20/2022 0827   CHOLHDL 3.7 06/15/2020 1602   VLDL 22.6 02/10/2019 1640   LDLCALC 64 08/20/2022 0827   LDLCALC 109 (H) 06/15/2020 1602   LDLDIRECT 132.9 09/23/2011 1557     Wt Readings from Last 3 Encounters:  01/27/23 241 lb (109.3 kg)  01/20/23 242 lb 6.4 oz (110 kg)  01/12/23 239 lb 12.8 oz (108.8 kg)      Other studies Reviewed: Additional studies/ records that were reviewed today include: TEE 09/04/22  Review of the above records today demonstrates:   1. PFO 0.3 cm.   2. Left ventricular ejection fraction, by estimation, is 60 to 65%. The  left ventricle has normal function. The left ventricle has no regional  wall motion abnormalities.   3. Right ventricular systolic function is normal. The right ventricular  size is normal.   4. No left atrial/left atrial appendage thrombus was detected.  5. The mitral valve is normal in structure. Trivial mitral valve  regurgitation. No evidence of mitral stenosis.   6. The aortic valve is normal in structure. Aortic valve regurgitation is  not visualized. No aortic stenosis is present.   7. Aortic dilatation noted. There is mild dilatation of the ascending  aorta, measuring 42 mm. There is mild dilatation of the aortic root,  measuring 45 mm.   8. The inferior vena cava is normal in size with greater than 50%  respiratory variability, suggesting right atrial pressure of 3 mmHg.   9. Evidence of atrial level shunting detected by color flow Doppler.  There is a moderately sized patent foramen ovale with predominantly left  to right shunting across the atrial septum.    ASSESSMENT AND PLAN:  1.  Typical atrial flutter: CHA2DS2-VASc of 4.  Currently on Eliquis and Toprol-XL.  At this point, he would prefer to avoid long-term medications.  Due to that, we Evellyn Tuff plan for ablation.  Risk and benefits have been discussed.  Risk of bleeding,  tamponade, heart block, stroke, damage surrounding organs, damage to vascular structures, death.  He understands these risks and is agreed to the procedure.  There are notes in epic that he has had atrial fibrillation, though I see no evidence of this on his ECGs.  Tacarra Justo discuss with his primary cardiologist prior to ablation whether or not he has had any AF and if this needs to be ablated as well.  2.  Second hypercoagulable state: Currently on Eliquis for atrial flutter  3.  Hypertension: Currently well-controlled  4.  Aortic atherosclerosis: Statin per primary cardiology    Current medicines are reviewed at length with the patient today.   The patient does not have concerns regarding his medicines.  The following changes were made today:  none  Labs/ tests ordered today include:  Orders Placed This Encounter  Procedures   EKG 12-Lead     Disposition:   FU with Tung Pustejovsky 3 months  Signed, Nyasha Rahilly Meredith Leeds, MD  01/27/2023 9:21 AM     [place] [address] [phone] (office) [phone] (fax)

## 2023-01-27 NOTE — Patient Instructions (Signed)
Medication Instructions:  Your physician recommends that you continue on your current medications as directed. Please refer to the Current Medication list given to you today.  *If you need a refill on your cardiac medications before your next appointment, please call your pharmacy*   Lab Work: None ordered   Testing/Procedures: None ordered   Follow-Up: At [facility], you and your health needs are our priority.  As part of our continuing mission to provide you with exceptional heart care, we have created designated Provider Care Teams.  These Care Teams include your primary Cardiologist (physician) and Advanced Practice Providers (APPs -  Physician Assistants and Nurse Practitioners) who all work together to provide you with the care you need, when you need it.   Your next appointment:   To be  determined  The format for your next appointment:   In Person  Provider:   Allegra Lai, MD\    Thank you for choosing [facility]!!   Trinidad Curet, RN [phone]  Other Instructions  Dr. Curt Bears is going to speak with Dr. Oval Linsey before determining what kind of ablation you need (atrial fibrillation vs atrial flutter). We will be in touch to arrange ablation procedure once the doctors discuss your care plan.    Cardiac Ablation Cardiac ablation is a procedure to destroy (ablate) heart tissue that is sending bad signals. These bad signals cause the heart to beat very fast or in a way that is not normal. Destroying some tissues can help make the heart rhythm normal. Tell your doctor about: Any allergies you have. All medicines you are taking. These include vitamins, herbs, eye drops, creams, and over-the-counter medicines. Any problems you or family members have had with anesthesia. Any bleeding problems you have. Any surgeries you have had. Any medical conditions you have. Whether you are pregnant or may be pregnant. What are the risks? Your doctor will talk with  you about risks. These may include: Infection. Bruising and bleeding. Stroke or blood clots. Damage to nearby areas of your body. Allergies to medicines or dyes. Needing a pacemaker if the heart gets damaged. A pacemaker helps the heart beat normally. The procedure not working. What happens before the procedure? Medicines Ask your doctor about changing or stopping: Your normal medicines. Vitamins, herbs, and supplements. Over-the-counter medicines. Do not take aspirin or ibuprofen unless you are told to. General instructions Follow instructions from your doctor about what you may eat and drink. If you will be going home right after the procedure, plan to have a responsible adult: Take you home from the hospital or clinic. You will not be allowed to drive. Care for you for the time you are told. Ask your doctor what steps will be taken to prevent the spread of germs. What happens during the procedure?  An IV tube will be put into one of your veins. You may be given: A sedative. This helps you relax. Anesthesia. This will: Numb certain areas of your body. The skin on your neck or groin will be numbed. A cut (incision) will be made in your neck or groin. A needle will be put through the cut and into a large vein. The small, thin tube (catheter) will be put into the needle. The tube will be moved to your heart. A type of X-ray (fluoroscopy) will be used to help guide the tube. It will also show constant images of the heart on a screen. Dye may be put through the tube. This helps your doctor see  your heart. An electric current will be sent from the tube to destroy heart tissue in certain areas. The tube will be taken out. Pressure will be held on your cut. This helps stop bleeding. A bandage (dressing) will be put over your cut. The procedure may vary among doctors and hospitals. What happens after the procedure? You will be monitored until you leave the hospital or clinic. This  includes checking your blood pressure, heart rate and rhythm, breathing rate, and blood oxygen level. Your cut will be checked for bleeding. You will need to lie still for a few hours. If your groin was used, you will need to keep your leg straight for a few hours after the small, thin tube is removed. This information is not intended to replace advice given to you by your health care provider. Make sure you discuss any questions you have with your health care provider. Document Revised: 05/06/2022 Document Reviewed: 05/06/2022 Elsevier Patient Education  [place].

## 2023-02-02 DIAGNOSIS — Z01812 Encounter for preprocedural laboratory examination: Secondary | ICD-10-CM | POA: Diagnosis not present

## 2023-02-02 DIAGNOSIS — I10 Essential (primary) hypertension: Secondary | ICD-10-CM | POA: Diagnosis not present

## 2023-02-02 DIAGNOSIS — I4892 Unspecified atrial flutter: Secondary | ICD-10-CM | POA: Diagnosis not present

## 2023-02-03 LAB — BASIC METABOLIC PANEL
BUN/Creatinine Ratio: 12 (ref 10–24)
BUN: 18 mg/dL (ref 8–27)
CO2: 21 mmol/L (ref 20–29)
Calcium: 9.4 mg/dL (ref 8.6–10.2)
Chloride: 105 mmol/L (ref 96–106)
Creatinine, Ser: 1.46 mg/dL — ABNORMAL HIGH (ref 0.76–1.27)
Glucose: 89 mg/dL (ref 70–99)
Potassium: 4.9 mmol/L (ref 3.5–5.2)
Sodium: 139 mmol/L (ref 134–144)
eGFR: 51 mL/min/{1.73_m2} — ABNORMAL LOW (ref >59)

## 2023-02-03 LAB — CBC WITH DIFFERENTIAL/PLATELET
Basophils Absolute: 0 10*3/uL (ref 0.0–0.2)
Basos: 0 %
EOS (ABSOLUTE): 0.2 10*3/uL (ref 0.0–0.4)
Eos: 4 %
Hematocrit: 34 % — ABNORMAL LOW (ref 37.5–51.0)
Hemoglobin: 12.6 g/dL — ABNORMAL LOW (ref 13.0–17.7)
Immature Grans (Abs): 0 10*3/uL (ref 0.0–0.1)
Immature Granulocytes: 0 %
Lymphocytes Absolute: 2.1 10*3/uL (ref 0.7–3.1)
Lymphs: 37 %
MCH: 38 pg — ABNORMAL HIGH (ref 26.6–33.0)
MCHC: 37.1 g/dL — ABNORMAL HIGH (ref 31.5–35.7)
MCV: 102 fL — ABNORMAL HIGH (ref 79–97)
Monocytes Absolute: 0.6 10*3/uL (ref 0.1–0.9)
Monocytes: 11 %
Neutrophils Absolute: 2.7 10*3/uL (ref 1.4–7.0)
Neutrophils: 48 %
Platelets: 281 10*3/uL (ref 150–450)
RBC: 3.32 x10E6/uL — ABNORMAL LOW (ref 4.14–5.80)
RDW: 19.1 % — ABNORMAL HIGH (ref 11.6–15.4)
WBC: 5.6 10*3/uL (ref 3.4–10.8)

## 2023-02-04 ENCOUNTER — Encounter: Payer: Self-pay | Admitting: Neurology

## 2023-02-05 ENCOUNTER — Encounter (HOSPITAL_COMMUNITY): Payer: Self-pay | Admitting: Cardiology

## 2023-02-05 ENCOUNTER — Encounter (HOSPITAL_COMMUNITY)
Admission: RE | Disposition: A | Discharge status: Home / Self Care | Payer: Self-pay | Source: Home / Self Care | Attending: Cardiology

## 2023-02-05 ENCOUNTER — Ambulatory Visit (HOSPITAL_COMMUNITY): Payer: PPO | Admitting: Anesthesiology

## 2023-02-05 ENCOUNTER — Other Ambulatory Visit: Payer: Self-pay

## 2023-02-05 ENCOUNTER — Ambulatory Visit (HOSPITAL_COMMUNITY)
Admission: RE | Admit: 2023-02-05 | Discharge: 2023-02-05 | Disposition: A | Discharge status: Home / Self Care | Payer: PPO | Attending: Cardiology | Admitting: Cardiology

## 2023-02-05 ENCOUNTER — Ambulatory Visit (HOSPITAL_BASED_OUTPATIENT_CLINIC_OR_DEPARTMENT_OTHER): Payer: PPO | Admitting: Anesthesiology

## 2023-02-05 DIAGNOSIS — J189 Pneumonia, unspecified organism: Secondary | ICD-10-CM | POA: Diagnosis not present

## 2023-02-05 DIAGNOSIS — I10 Essential (primary) hypertension: Secondary | ICD-10-CM

## 2023-02-05 DIAGNOSIS — I5042 Chronic combined systolic (congestive) and diastolic (congestive) heart failure: Secondary | ICD-10-CM | POA: Diagnosis not present

## 2023-02-05 DIAGNOSIS — D6859 Other primary thrombophilia: Secondary | ICD-10-CM | POA: Diagnosis not present

## 2023-02-05 DIAGNOSIS — M1990 Unspecified osteoarthritis, unspecified site: Secondary | ICD-10-CM

## 2023-02-05 DIAGNOSIS — I4892 Unspecified atrial flutter: Secondary | ICD-10-CM

## 2023-02-05 DIAGNOSIS — J45909 Unspecified asthma, uncomplicated: Secondary | ICD-10-CM | POA: Diagnosis not present

## 2023-02-05 DIAGNOSIS — E669 Obesity, unspecified: Secondary | ICD-10-CM | POA: Diagnosis not present

## 2023-02-05 DIAGNOSIS — I483 Typical atrial flutter: Secondary | ICD-10-CM | POA: Diagnosis not present

## 2023-02-05 DIAGNOSIS — I4891 Unspecified atrial fibrillation: Secondary | ICD-10-CM | POA: Diagnosis not present

## 2023-02-05 DIAGNOSIS — K219 Gastro-esophageal reflux disease without esophagitis: Secondary | ICD-10-CM | POA: Insufficient documentation

## 2023-02-05 DIAGNOSIS — I110 Hypertensive heart disease with heart failure: Secondary | ICD-10-CM | POA: Diagnosis not present

## 2023-02-05 DIAGNOSIS — E785 Hyperlipidemia, unspecified: Secondary | ICD-10-CM | POA: Insufficient documentation

## 2023-02-05 DIAGNOSIS — Z7901 Long term (current) use of anticoagulants: Secondary | ICD-10-CM | POA: Diagnosis not present

## 2023-02-05 DIAGNOSIS — I700 Atherosclerosis of aorta: Secondary | ICD-10-CM | POA: Insufficient documentation

## 2023-02-05 DIAGNOSIS — D649 Anemia, unspecified: Secondary | ICD-10-CM | POA: Diagnosis not present

## 2023-02-05 DIAGNOSIS — Z6832 Body mass index (BMI) 32.0-32.9, adult: Secondary | ICD-10-CM | POA: Insufficient documentation

## 2023-02-05 HISTORY — PX: CARDIOVERSION: SHX1299

## 2023-02-05 SURGERY — CARDIOVERSION
Anesthesia: General

## 2023-02-05 MED ORDER — PROPOFOL 10 MG/ML IV BOLUS
INTRAVENOUS | Status: DC | PRN
  Administered 2023-02-05: 70 mg via INTRAVENOUS

## 2023-02-05 MED ORDER — LIDOCAINE 2% (20 MG/ML) 5 ML SYRINGE
INTRAMUSCULAR | Status: DC | PRN
  Administered 2023-02-05: 100 mg via INTRAVENOUS

## 2023-02-05 MED ORDER — SODIUM CHLORIDE 0.9 % IV SOLN: INTRAVENOUS | Status: DC | PRN

## 2023-02-05 NOTE — CV Procedure (Signed)
Procedure:   DCCV  Indication:  Symptomatic atrial flutter  Procedure Note:  The patient signed informed consent.  They have had had therapeutic anticoagulation with apixaban greater than 3 weeks.  Anesthesia was administered by Dr. Ambrose Pancoast.  Patient received 100 mg IV lidocaine and 70 mg IV propofol.Adequate airway was maintained throughout and vital followed per protocol.  They were cardioverted x 2 with 150, 200J of biphasic synchronized energy.  After the 150 J shock, he converted from atrial flutter to atrial fibrillation. He converted to sinus after the second shock at 200 J.  There were no apparent complications.  The patient had normal neuro status and respiratory status post procedure with vitals stable as recorded elsewhere.    Follow up:  They will continue on current medical therapy and follow up with cardiology as scheduled.  Buford Dresser, MD PhD 02/05/2023 10:51 AM

## 2023-02-05 NOTE — Discharge Instructions (Signed)

## 2023-02-05 NOTE — Interval H&P Note (Signed)
History and Physical Interval Note:  02/05/2023 10:41 AM  [name]  has presented today for surgery, with the diagnosis of AFLUTTER.  The various methods of treatment have been discussed with the patient and family. After consideration of risks, benefits and other options for treatment, the patient has consented to  Procedure(s): CARDIOVERSION (N/A) as a surgical intervention.  The patient's history has been reviewed, patient examined, no change in status, stable for surgery.  I have reviewed the patient's chart and [name].  Questions were answered to the patient's satisfaction.     Anaid Haney Harrell Gave

## 2023-02-05 NOTE — Transfer of Care (Signed)
Immediate Anesthesia Transfer of Care Note  Patient: [name]  Procedure(s) Performed: CARDIOVERSION  Patient Location: Endoscopy Unit  Anesthesia Type:General  Level of Consciousness: drowsy and patient cooperative  Airway & Oxygen Therapy: Patient Spontanous Breathing  Post-op Assessment: Report given to RN, Post -op Vital signs reviewed and stable, and Patient moving all extremities X 4  Post vital signs: Reviewed and stable  Last Vitals:  Vitals Value Taken Time  BP 94/77   Temp    Pulse 65   Resp 15   SpO2 94     Last Pain:  Vitals:   02/05/23 1000  TempSrc: Temporal  PainSc: 0-No pain         Complications: No notable events documented.

## 2023-02-05 NOTE — Anesthesia Postprocedure Evaluation (Signed)
Anesthesia Post Note  Patient: [name]  Procedure(s) Performed: CARDIOVERSION     Patient location during evaluation: PACU Anesthesia Type: General Level of consciousness: awake and alert Pain management: pain level controlled Vital Signs Assessment: post-procedure vital signs reviewed and stable Respiratory status: spontaneous breathing, nonlabored ventilation, respiratory function stable and patient connected to nasal cannula oxygen Cardiovascular status: blood pressure returned to baseline and stable Postop Assessment: no apparent nausea or vomiting Anesthetic complications: no   No notable events documented.  Last Vitals:  Vitals:   02/05/23 1000 02/05/23 1054  BP: (!) 129/95 94/77  Pulse: (!) 120 63  Resp: 17 (!) 29  Temp: 36.6 C 36.4 C  SpO2: 96% 97%    Last Pain:  Vitals:   02/05/23 1054  TempSrc: Oral  PainSc: 0-No pain                 Alliana Mcauliff

## 2023-02-05 NOTE — Anesthesia Preprocedure Evaluation (Addendum)
Anesthesia Evaluation  Patient identified by MRN, date of birth, ID band Patient awake    Reviewed: Allergy & Precautions, NPO status , Patient's Chart, lab work & pertinent test results  History of Anesthesia Complications Negative for: history of anesthetic complications  Airway Mallampati: II  TM Distance: >3 FB Neck ROM: Full    Dental  (+) Dental Advisory Given, Teeth Intact   Pulmonary shortness of breath, asthma , pneumonia   Pulmonary exam normal        Cardiovascular hypertension, Pt. on home beta blockers and Pt. on medications + DOE  + dysrhythmias Atrial Fibrillation  Rhythm:Irregular Rate:Normal   '23 TTE - EF 55 to 60%. There is mild concentric left ventricular hypertrophy of the septal segment. Grade I diastolic dysfunction (impaired relaxation). Left atrial size was mildly dilated. Right atrial size was mildly dilated. Aortic valve regurgitation is trivial. There is moderate dilatation of the aortic root, measuring 46 mm. There is moderate dilatation of the ascending aorta, measuring 44 mm.     Neuro/Psych negative neurological ROS  negative psych ROS   GI/Hepatic Neg liver ROS,GERD  Medicated,,  Endo/Other   Obesity   Renal/GU negative Renal ROS     Musculoskeletal  (+) Arthritis ,    Abdominal   Peds  Hematology  (+) Blood dyscrasia, anemia  On eliquis    Anesthesia Other Findings   Reproductive/Obstetrics                             Anesthesia Physical Anesthesia Plan  ASA: 3  Anesthesia Plan: General   Post-op Pain Management: Minimal or no pain anticipated   Induction: Intravenous  PONV Risk Score and Plan: 2 and Treatment may vary due to age or medical condition and Propofol infusion  Airway Management Planned: Natural Airway, Mask and Simple Face Mask  Additional Equipment: None  Intra-op Plan:   Post-operative Plan:   Informed Consent: I have  reviewed the patients History and Physical, chart, labs and discussed the procedure including the risks, benefits and alternatives for the proposed anesthesia with the patient or authorized representative who has indicated his/her understanding and acceptance.       Plan Discussed with: CRNA and Anesthesiologist  Anesthesia Plan Comments: ( )        Anesthesia Quick Evaluation

## 2023-02-09 ENCOUNTER — Encounter (HOSPITAL_COMMUNITY): Payer: Self-pay | Admitting: Cardiology

## 2023-02-11 ENCOUNTER — Telehealth: Payer: Self-pay

## 2023-02-11 NOTE — Telephone Encounter (Signed)
I called pt back to discuss his ablation. He informed me that he is wearing a 2 week monitor that was ordered by Dr. Oval Linsey. It was put on after his DCCV on 3/7 and ordered for 2 weeks.   After speaking with Sherri, Dr. Curt Bears RN, we moved his procedure date out to 5/28 (holding a spot for him).

## 2023-02-11 NOTE — Telephone Encounter (Signed)
LM for pt to call back to schedule ablation...  We are holding 3/29 @ 1:30 for him.

## 2023-02-11 NOTE — Telephone Encounter (Signed)
Pt is returning call.  

## 2023-02-16 NOTE — Progress Notes (Signed)
Initial neurology clinic note  SERVICE DATE: 02/20/23  Reason for Evaluation: Consultation requested by Skeet Latch, MD for an opinion regarding "neuropathy". My final recommendations will be communicated back to the requesting physician by way of shared medical record or letter to requesting physician via [place] mail.  HPI: This is Mr. [name], a 72 y.o. left-handed male with a medical history of ascending aortic aneurysm, aortic atherosclerosis, pAfib, pre-diabetes, CHF, HTN, HLD, obesity, GERD, OA, lumbar stenosis s/p surgery (~2004) who presents to neurology clinic with the chief complaint of numbness in feet. The patient is alone today.  Patient has had symptoms for several years. He has some mild numbness in bilateral feet, more in the toes. He has to be careful when driving to make sure he feet are on the pedals. He denies any pain. He does endorses chronic back pain. He sometimes gets radiating, burning pain on right buttocks area. He denies any difficulty with ambulation, imbalance, and has had no falls.  The patient does not report symptoms referable to autonomic dysfunction including impaired sweating, heat or cold intolerance, excessive mucosal dryness, gastroparetic early satiety, postprandial abdominal bloating, constipation, bowel or bladder dyscontrol, or syncope/presyncope/orthostatic intolerance.  He does not report any constitutional symptoms like fever, night sweats, anorexia or unintentional weight loss.  He had an EMG in 2020 that showed b/l L5-S1 radiculopathy, right fibular mononeuropathy, possible PN per report.  Patient is on gabapentin 300 mg qhs. He does not have the radiating back pain as much, so he thinks it may help.  Patient feels tired all the time. He does not have the energy to do much. He thinks this is related to his heart. Patient thinks he sleeps okay. He wakes up a lot during night. He thinks he sleeps about 6-7 hours. He falls asleep on the  couch watching TV and then will go to bed. He endorses snoring. He had an in home oxygen study that did not show clear sleep apnea.   EtOH use: 1 drink every few days  Restrictive diet? No Family history of neuropathy/myopathy/neurologic disease? No   MEDICATIONS:  Outpatient Encounter Medications as of 02/20/2023  Medication Sig   acetaminophen (TYLENOL) 650 MG CR tablet Take 650 mg by mouth daily.   albuterol (PROAIR HFA) 108 (90 Base) MCG/ACT inhaler Inhale 2 puffs into the lungs every 6 (six) hours as needed for wheezing or shortness of breath.   apixaban (ELIQUIS) 5 MG TABS tablet Take 1 tablet (5 mg total) by mouth 2 (two) times daily.   budesonide-formoterol (SYMBICORT) 80-4.5 MCG/ACT inhaler Inhale 2 puffs into the lungs 2 (two) times daily. (Patient taking differently: Inhale 2 puffs into the lungs 2 (two) times daily as needed (Wheezing / SOB).)   diclofenac Sodium (VOLTAREN) 1 % GEL Apply 1 Application topically 4 (four) times daily as needed (pain).   esomeprazole (NEXIUM) 40 MG capsule Take 1 capsule (40 mg total) by mouth daily at 12 noon. Take 1 Daily (Patient taking differently: Take 40 mg by mouth daily as needed (acid reflux).)   gabapentin (NEURONTIN) 300 MG capsule Take 1 capsule (300 mg total) by mouth at bedtime. 1 capsule at bedtime   Melatonin 10 MG TABS Take 10 mg by mouth at bedtime as needed (sleep).   metoprolol succinate (TOPROL-XL) 25 MG 24 hr tablet Take 1.5 tablets (37.5 mg total) by mouth daily. (Patient taking differently: Take 37.5 mg by mouth at bedtime.)   Multiple Vitamins-Minerals (CENTRUM ADULTS PO) Take 1 tablet  by mouth 3 (three) times a week.   oxymetazoline (AFRIN) 0.05 % nasal spray Place 1 spray into both nostrils 2 (two) times daily as needed for congestion.   rosuvastatin (CRESTOR) 20 MG tablet Take 1 tablet (20 mg total) by mouth every evening.   No facility-administered encounter medications on file as of 02/20/2023.    PAST MEDICAL  HISTORY: Past Medical History:  Diagnosis Date   ALLERGIC RHINITIS 12/31/2007   Aortic atherosclerosis ([place]) 08/26/2022   Arthritis    Chronic combined systolic and diastolic heart failure ([place]) 06/14/2021   COUGH, CHRONIC 07/18/2007   DEGENERATIVE JOINT DISEASE 07/18/2007   Extrinsic asthma, unspecified 09/08/2009   GERD 07/18/2007   HYPERLIPIDEMIA 08/14/2010   Hypertension    Impaired glucose tolerance 09/23/2011   OBESITY 07/18/2007   Pneumonia     PAST SURGICAL HISTORY: Past Surgical History:  Procedure Laterality Date   CARDIOVERSION N/A 12/28/2020   Procedure: CARDIOVERSION;  Surgeon: Skeet Latch, MD;  Location: [place];  Service: Cardiovascular;  Laterality: N/A;   CARDIOVERSION N/A 09/04/2022   Procedure: CARDIOVERSION;  Surgeon: Jerline Pain, MD;  Location: [facility] ENDOSCOPY;  Service: Cardiovascular;  Laterality: N/A;   CARDIOVERSION N/A 02/05/2023   Procedure: CARDIOVERSION;  Surgeon: Buford Dresser, MD;  Location: [place];  Service: Cardiovascular;  Laterality: N/A;   DG FEMUR LEFT  ([place] [facility])     rod placed   LUMBAR [place]     TEE WITHOUT CARDIOVERSION N/A 12/28/2020   Procedure: TRANSESOPHAGEAL ECHOCARDIOGRAM (TEE);  Surgeon: Skeet Latch, MD;  Location: [place];  Service: Cardiovascular;  Laterality: N/A;   TEE WITHOUT CARDIOVERSION N/A 09/04/2022   Procedure: TRANSESOPHAGEAL ECHOCARDIOGRAM (TEE);  Surgeon: Jerline Pain, MD;  [facility];  Service: Cardiovascular;  Laterality: N/A;   TOOTH EXTRACTION     TOTAL KNEE ARTHROPLASTY Right 07/26/2018   Procedure: RIGHT TOTAL KNEE ARTHROPLASTY;  Surgeon: Jessy Oto, MD;  Location: [place];  Service: Orthopedics;  Laterality: Right;    ALLERGIES: No Known Allergies  FAMILY HISTORY: Family History  Problem Relation Age of Onset   Lupus Father    Cerebral palsy Sister    Diabetes Maternal Grandmother     SOCIAL HISTORY: Social History   Tobacco Use   Smoking  status: Never   Smokeless tobacco: Never   Tobacco comments:    Marijuana 30 years  Vaping Use   Vaping Use: Never used  Substance Use Topics   Alcohol use: Yes    Alcohol/week: 2.0 - 4.0 standard drinks of alcohol    Types: 2 - 4 Cans of beer per week    Comment: moderate   Drug use: Not Currently    Types: Marijuana    Comment: as a teenager   Social History   Social History Narrative   Are you right handed or left handed? left   Are you currently employed ?    What is your current occupation?retired   Do you live at home alone?   Who lives with you? Wife and grandson   What type of home do you live in: 1 story or 2 story? one   Caffeine 1-2 a day     OBJECTIVE: PHYSICAL EXAM: BP 118/80   Pulse 60   Ht 6' (1.829 m)   Wt 238 lb (108 kg)   SpO2 (!) 89%   BMI 32.28 kg/m   General: General appearance: Awake and alert. No distress. Cooperative with exam.  Skin: No obvious rash or jaundice. HEENT:  Atraumatic. Anicteric. Lungs: Non-labored breathing on room air  Extremities: No edema. No obvious deformity.  Psych: Affect appropriate.  Neurological: Mental Status: Alert. Speech fluent. No pseudobulbar affect Cranial Nerves: CNII: No RAPD. Visual fields grossly intact. CNIII, IV, VI: PERRL. No nystagmus. EOMI. CN V: Facial sensation intact bilaterally to fine touch. CN VII: Facial muscles symmetric and strong. No ptosis at rest. CN VIII: Hearing grossly intact bilaterally. CN IX: No hypophonia. CN X: Palate elevates symmetrically. CN XI: Full strength shoulder shrug bilaterally. CN XII: Tongue protrusion full and midline. No atrophy or fasciculations. No significant dysarthria Motor: Tone is normal. No fasciculations in extremities. No atrophy.  Individual muscle group testing (MRC grade out of 5):  Movement     Neck flexion 5    Neck extension 5     Right Left   Shoulder abduction 5 5   Elbow flexion 5 5   Elbow extension 5 5   Finger abduction - FDI  5 5   Finger abduction - ADM 5 5   Finger extension 5 5   Finger distal flexion - 2/3 5 5    Finger distal flexion - 4/5 5 5    Thumb flexion - FPL 5 5   Thumb abduction - APB 5- 5-    Hip flexion 5 5   Hip extension 5 5   Hip adduction 5 5   Hip abduction 5 5   Knee extension 5 5   Knee flexion 5 5   Dorsiflexion 5 5   Plantarflexion 5 5   Inversion 5 5   Eversion 5 5   Great toe extension 4 4+   Great toe flexion 4 4+     Reflexes:  Right Left   Bicep 2+ 2+   Tricep 2+ 2+   BrRad 2+ 2+   Knee 2+ 2+   Ankle 1+ 1+    Pathological Reflexes: Babinski: mute response bilaterally Hoffman: absent bilaterally Troemner: absent bilaterally Sensation: Pinprick: Intact in all extremities Vibration: Intact in upper extremities. Minimal in bilateral great toes, diminished in bilateral ankles, intact at bilateral patella Proprioception: Intact in bilateral great toes. Coordination: Intact finger-to- nose-finger bilaterally. Romberg with moderate sway. Gait: Able to rise from chair with arms crossed unassisted. Normal, narrow-based gait. Able to walk on toes and heels.  Lab and Test Review: Internal labs: 02/02/23: BMP: Cr 1.46 CBC: MCV 102  TSH (12/23/22): 1.22  09/19/22: B12: 542 Folate: > 20 Vit D: 59  A1c (04/03/22): 5.8  Imaging: MRI lumbar spine wo contrast (05/26/2016): FINDINGS: Segmentation:  5 lumbar type vertebral bodies.   Alignment: No curvature. 4 mm anterolisthesis at L4-5. 2 mm anterolisthesis at L2-3.   Vertebrae: No focal bone lesion. Degenerative endplate changes at 075-GRM.   Conus medullaris: Extends to the L1 level and appears normal.   Paraspinal and other soft tissues: No significant finding.   Disc levels:   T12-L1:  Normal.   L1-2: Desiccation and bulging of the disc with a shallow left posterior lateral disc herniation that indents the thecal sac. Narrowing of the left lateral recess could cause neural compression. This has worsened  since the previous study.   L2-3: Bilateral facet degeneration and hypertrophy. 2 mm of anterolisthesis. Mild bulging of the disc. Very shallow left-sided protrusion superimposed. Mild narrowing of the lateral recesses without visible neural compression. Similar when compared to the previous study.   L3-4: Mild bulging of the disc. Mild facet and ligamentous hypertrophy. Mild lateral recess narrowing without visible neural  compression.   L4-5: Advanced bilateral facet arthropathy with 4 mm of anterolisthesis. Circumferential bulging of the disc. Stenosis of both lateral recesses that could cause neural compression on either or both sides. Moderate foraminal narrowing right more than left. Facet edema could be associated with pain. Spinous process abutment could be associated with pain. Findings have worsened considerably since the previous study at this level.   L5-S1: Chronic disc degeneration with desiccation and loss of height. Chronic endplate cystic changes. Mild bulging of the disc. Mild facet degeneration. Mild narrowing of the subarticular lateral recesses without visible neural compression. Mild foraminal narrowing without visible neural compression. Degenerative changes at this level have worsened somewhat since previous study.   IMPRESSION: The dominant finding is worsening of degenerative changes at the L4-5 level. There is advanced facet arthropathy with hypertrophic degenerative change allowing anterolisthesis of 4 mm. Facet joints are edematous and could be painful. The disc bulges circumferentially. There is spinal stenosis that could cause neural compression in either or both lateral recesses or neural foramina. This appearance could worsen with standing or flexion.   L2-3 facet arthropathy with 2 mm of anterolisthesis. Bulging of the disc and a shallow left posterior lateral disc protrusion. Mild stenosis of both lateral recesses.   L1-2 shallow disc  herniation in the left posterior lateral direction with narrowing of left lateral recess that could be symptomatic. This has worsened since the previous study.   Worsened disc degeneration at L5-S1 with endplate changes. Mild facet degeneration. No visible neural compression at this level, but the findings could contribute to low back pain.  EMG (08/16/2019 by Dr. Laurence Spates): EMG & NCV Findings: Evaluation of the left fibular motor, the left tibial motor, the right tibial motor, and the right superficial fibular sensory nerves showed reduced amplitude (L0.3, L2.1, R1.9, R0.7 V).  The right fibular motor nerve showed reduced amplitude (0.7 mV), decreased conduction velocity (B Fib-Ankle, 37 m/s), and decreased conduction velocity (Poplt-B Fib, 26 m/s).  The left saphenous sensory nerve showed no response (14cm).  The left superficial fibular sensory nerve showed no response (14 cm).  The left sural sensory nerve showed prolonged distal peak latency (4.3 ms) and decreased conduction velocity (Calf-Lat Mall, 33 m/s).  The right sural sensory nerve showed prolonged distal peak latency (5.1 ms), reduced amplitude (3.7 V), and decreased conduction velocity (Calf-Lat Mall, 27 m/s).  All remaining nerves (as indicated in the following tables) were within normal limits.  All left vs. right side differences were within normal limits.     Needle evaluation of the left Fibularis Longus muscle showed increased insertional activity and slightly increased spontaneous activity.  The left medial gastrocnemius muscle showed increased insertional activity, moderately increased spontaneous activity, and diminished recruitment.  The right Fibularis Longus muscle showed increased insertional activity and diminished recruitment.  The right medial gastrocnemius muscle showed increased insertional activity.  All remaining muscles (as indicated in the following table) showed no evidence of electrical instability.      Impression: The above electrodiagnostic study is ABNORMAL and somewhat difficult to interpret but does reveal evidence of:   1.  A mild/moderate chronic L5 and S1 radiculopathy on the right and left.     2. An at least  moderate right fibular nerve neuropathy at or above the knee affecting sensory and motor components.    3.  A possible underlying sensory predominant axonal more than demyelinating polyneuropathy.  This is not diagnostic and difficult to ascertain.   Recommendations: 1.  Follow-up with referring physician. 2.  Continue current management of symptoms.  If felt to be more of a polyneuropathy then referral for consultation with neurology.  ASSESSMENT: [name] is a 72 y.o. male who presents for evaluation of numbness in bilateral feet. He has a relevant medical history of ascending aortic aneurysm, aortic atherosclerosis, pAfib, pre-diabetes, CHF, HTN, HLD, obesity, GERD, OA, lumbar stenosis s/p surgery (~2004). His neurological examination is pertinent for diminished sensation in bilateral lower extremities in a length dependent pattern. Available diagnostic data is significant for HbA1c of 5.8, B12 542. MRI lumbar spine from 2017 showed multilevel stenosis. The etiology of patient's symptoms is currently unclear. He exam is most consistent with a distal symmetric polyneuropathy, with only known risk factor being pre-diabetes. He has a history of lumbar radiculopathy which is also likely contributing. An EMG from 2020 also mentioned a right peroneal/fibular neuropathy, which is not clearly present by exam today. I will get labs to look for treatable causes and repeat EMG to clarify.  PLAN: -Blood work: B1, B12, folate, IFE, HbA1c -Repeat EMG: PN (R > L) -Gabapentin 300 mg qhs  -Return to clinic in 6 months  The impression above as well as the plan as outlined below were extensively discussed with the patient who voiced understanding. All questions were answered to their  satisfaction.  When available, results of the above investigations and possible further recommendations will be communicated to the patient via telephone/MyChart. Patient to call office if not contacted after expected testing turnaround time.   Total time spent reviewing records, interview, history/exam, documentation, and coordination of care on day of encounter:  45 min   Thank you for allowing me to participate in patient's care.  If I can answer any additional questions, I would be pleased to do so.  Kai Levins, MD   CC: [address], [place] [id number]  CC: Referring provider: Skeet Latch, [place],  [place] [address]

## 2023-02-20 ENCOUNTER — Other Ambulatory Visit (INDEPENDENT_AMBULATORY_CARE_PROVIDER_SITE_OTHER): Payer: PPO

## 2023-02-20 ENCOUNTER — Ambulatory Visit: Payer: PPO | Admitting: Neurology

## 2023-02-20 ENCOUNTER — Encounter: Payer: Self-pay | Admitting: Neurology

## 2023-02-20 VITALS — BP 118/80 | HR 60 | Ht 72.0 in | Wt 238.0 lb

## 2023-02-20 DIAGNOSIS — M5441 Lumbago with sciatica, right side: Secondary | ICD-10-CM | POA: Diagnosis not present

## 2023-02-20 DIAGNOSIS — G8929 Other chronic pain: Secondary | ICD-10-CM

## 2023-02-20 DIAGNOSIS — R200 Anesthesia of skin: Secondary | ICD-10-CM

## 2023-02-20 DIAGNOSIS — R7303 Prediabetes: Secondary | ICD-10-CM

## 2023-02-20 LAB — HEMOGLOBIN A1C: Hgb A1c MFr Bld: 6 % (ref 4.6–6.5)

## 2023-02-20 LAB — FOLATE: Folate: 13.6 ng/mL (ref >5.9)

## 2023-02-20 LAB — VITAMIN B12: Vitamin B-12: 255 pg/mL (ref 211–911)

## 2023-02-20 NOTE — Patient Instructions (Signed)
I saw you today for numbness in the feet. This could be related to neuropathy or a pinched nerve in the back.  I would like to investigate further to understand why you are having these symptoms: -Blood work today -Muscle and nerve test called EMG (see more information below)  I will be in touch when I have the results. Please let me know if you have any questions or concerns in the meantime.   The physicians and staff at [facility] are committed to providing excellent care. You may receive a survey requesting feedback about your experience at our office. We strive to receive "very good" responses to the survey questions. If you feel that your experience would prevent you from giving the office a "very good " response, please contact our office to try to remedy the situation. We may be reached at [phone]. Thank you for taking the time out of your busy day to complete the survey.  Kai Levins, MD [facility]  ELECTROMYOGRAM AND NERVE CONDUCTION STUDIES (EMG/NCS) INSTRUCTIONS  How to Prepare The neurologist conducting the EMG will need to know if you have certain medical conditions. Tell the neurologist and other EMG lab personnel if you: Have a pacemaker or any other electrical medical device Take blood-thinning medications Have hemophilia, a blood-clotting disorder that causes prolonged bleeding Bathing Take a shower or bath shortly before your exam in order to remove oils from your skin. Don't apply lotions or creams before the exam.  What to Expect You'll likely be asked to change into a hospital gown for the procedure and lie down on an examination table. The following explanations can help you understand what will happen during the exam.  Electrodes. The neurologist or a technician places surface electrodes at various locations on your skin depending on where you're experiencing symptoms. Or the neurologist may insert needle electrodes at different sites depending on  your symptoms.  Sensations. The electrodes will at times transmit a tiny electrical current that you may feel as a twinge or spasm. The needle electrode may cause discomfort or pain that usually ends shortly after the needle is removed. If you are concerned about discomfort or pain, you may want to talk to the neurologist about taking a short break during the exam.  Instructions. During the needle EMG, the neurologist will assess whether there is any spontaneous electrical activity when the muscle is at rest - activity that isn't present in healthy muscle tissue - and the degree of activity when you slightly contract the muscle.  He or she will give you instructions on resting and contracting a muscle at appropriate times. Depending on what muscles and nerves the neurologist is examining, he or she may ask you to change positions during the exam.  After your EMG You may experience some temporary, minor bruising where the needle electrode was inserted into your muscle. This bruising should fade within several days. If it persists, contact your primary care doctor.

## 2023-02-23 ENCOUNTER — Telehealth (HOSPITAL_BASED_OUTPATIENT_CLINIC_OR_DEPARTMENT_OTHER): Payer: Self-pay

## 2023-02-23 DIAGNOSIS — D6869 Other thrombophilia: Secondary | ICD-10-CM

## 2023-02-23 DIAGNOSIS — I10 Essential (primary) hypertension: Secondary | ICD-10-CM

## 2023-02-23 DIAGNOSIS — I483 Typical atrial flutter: Secondary | ICD-10-CM

## 2023-02-23 NOTE — Telephone Encounter (Addendum)
Results called to patient who verbalizes understanding! Patient does state that he finally did get a Chad mobile and he has noticed possible a. Fib readings, explained to him that he could attach them to a mychart reading if he would like them reviewed. He did send his heart monitor back and has not missed any doses of Eliquis so he is comfortable waiting to see what the monitor report says before proceeding with ablation. Labs ordered and mailed to patient.    ----- Message from Skeet Latch, MD sent at 02/23/2023  2:55 AM EDT ----- Very mild anemia which is new for him.  Has he noticed any bleeding?  Dark blood in stools?  Kidney function was also mildly abnormal.  Repeat CBC and BMP.

## 2023-02-25 DIAGNOSIS — I4892 Unspecified atrial flutter: Secondary | ICD-10-CM | POA: Diagnosis not present

## 2023-02-25 LAB — IMMUNOFIXATION ELECTROPHORESIS
IgG (Immunoglobin G), Serum: 1012 mg/dL (ref 600–1540)
IgM, Serum: 59 mg/dL (ref 50–300)
Immunoglobulin A: 210 mg/dL (ref 70–320)

## 2023-02-26 ENCOUNTER — Encounter: Payer: Self-pay | Admitting: Neurology

## 2023-02-27 DIAGNOSIS — I483 Typical atrial flutter: Secondary | ICD-10-CM | POA: Diagnosis not present

## 2023-02-27 DIAGNOSIS — D6869 Other thrombophilia: Secondary | ICD-10-CM | POA: Diagnosis not present

## 2023-02-27 DIAGNOSIS — I10 Essential (primary) hypertension: Secondary | ICD-10-CM | POA: Diagnosis not present

## 2023-02-28 LAB — CBC
Hematocrit: 44.4 % (ref 37.5–51.0)
Hemoglobin: 14.7 g/dL (ref 13.0–17.7)
MCH: 30.2 pg (ref 26.6–33.0)
MCHC: 33.1 g/dL (ref 31.5–35.7)
MCV: 91 fL (ref 79–97)
Platelets: 198 10*3/uL (ref 150–450)
RBC: 4.87 x10E6/uL (ref 4.14–5.80)
RDW: 12.1 % (ref 11.6–15.4)
WBC: 5.1 10*3/uL (ref 3.4–10.8)

## 2023-02-28 LAB — BASIC METABOLIC PANEL
BUN/Creatinine Ratio: 13 (ref 10–24)
BUN: 19 mg/dL (ref 8–27)
CO2: 21 mmol/L (ref 20–29)
Calcium: 9.3 mg/dL (ref 8.6–10.2)
Chloride: 105 mmol/L (ref 96–106)
Creatinine, Ser: 1.42 mg/dL — ABNORMAL HIGH (ref 0.76–1.27)
Glucose: 82 mg/dL (ref 70–99)
Potassium: 4.8 mmol/L (ref 3.5–5.2)
Sodium: 142 mmol/L (ref 134–144)
eGFR: 53 mL/min/{1.73_m2} — ABNORMAL LOW (ref >59)

## 2023-03-02 ENCOUNTER — Other Ambulatory Visit (HOSPITAL_BASED_OUTPATIENT_CLINIC_OR_DEPARTMENT_OTHER): Payer: Self-pay | Admitting: Cardiovascular Disease

## 2023-03-02 ENCOUNTER — Telehealth: Payer: Self-pay | Admitting: Cardiovascular Disease

## 2023-03-02 MED ORDER — APIXABAN 5 MG PO TABS: 5.0000 mg | ORAL_TABLET | Freq: Two times a day (BID) | ORAL | 1 refills | Status: DC

## 2023-03-02 NOTE — Telephone Encounter (Signed)
Please review for refill. Thank you! 

## 2023-03-02 NOTE — Telephone Encounter (Signed)
*  STAT* If patient is at the pharmacy, call can be transferred to refill team.   1. Which medications need to be refilled? (please list name of each medication and dose if known)  apixaban (ELIQUIS) 5 MG TABS tablet    2. Which pharmacy/location (including street and city if local pharmacy) is medication to be sent to?  [id number] - [place], [place] - [address]    3. Do they need a 30 day or 90 day supply? 30 day

## 2023-03-02 NOTE — Telephone Encounter (Signed)
Prescription refill request for Eliquis received. Indication:  AF/DVT Last office visit: 01/2023 - [place] Scr:  1.42 (02/27/23) Age:  72 Weight:  108 kg  Ok to continue on Eliquis 5 mg bid

## 2023-03-03 IMAGING — CT CT HEART MORP W/ CTA COR W/ SCORE W/ CA W/CM &/OR W/O CM
4 of 7 series · 8 of 20 positions shown, 9 images · IV contrast (APPLIED)
Comparison: 12/22/2020
COMPARISON: 12/22/2020

Addendum:
EXAM:
OVER-READ INTERPRETATION  CT CHEST

The following report is an over-read performed by radiologist Dr.
Jul Ansah [REDACTED] on 06/28/2021. This over-read
does not include interpretation of cardiac or coronary anatomy or
pathology. The coronary calcium score/coronary CTA interpretation by
the cardiologist is attached.
HISTORY: 70 yo male with dyspnea on exertion (FUNG)
Cardiac/Coronary CTA
TECHNIQUE: The patient was scanned on a Siemens Force scanner.
PROTOCOL: A 100 kV prospective scan was triggered in the descending thoracic
aorta at 111 HU's. Axial non-contrast 3 mm slices were carried out
through the heart. The data set was analyzed on a dedicated work
station and scored using the Agatson method. Gantry rotation speed
was 250 msecs and collimation was .6 mm. Beta blockade and 0.8 mg of
sl NTG was given. The 3D data set was reconstructed in 5% intervals
of the 35-75 % of the R-R cycle. Diastolic phases were analyzed on a
dedicated work station using MPR, MIP and VRT modes. The patient
received 100mL OMNIPAQUE IOHEXOL 350 MG/ML SOLN of contrast.

[Series 6: best diast · axial · 0.44mm/px · z∈[-207,-166]mm · 2 of 313 slices shown, 3 images]
[im 105/313  vessel]
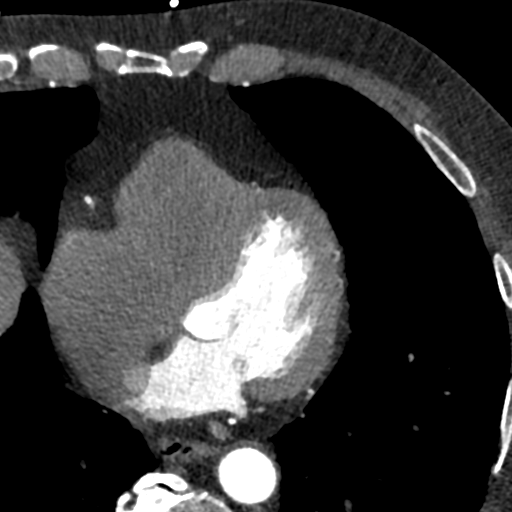
[im 105/313  lung]
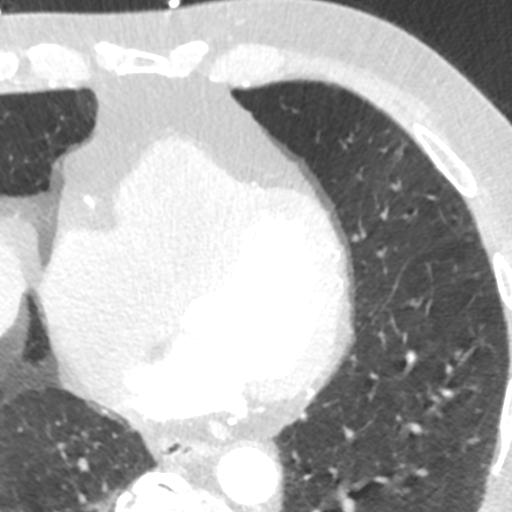
[im 209/313  vessel]
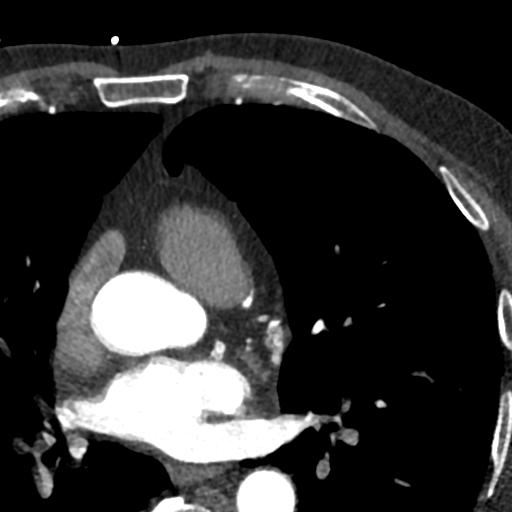

[Series 7: best syst · axial · 0.44mm/px · z∈[-207,-166]mm · 2 of 313 slices shown]
[im 105/313  vessel]
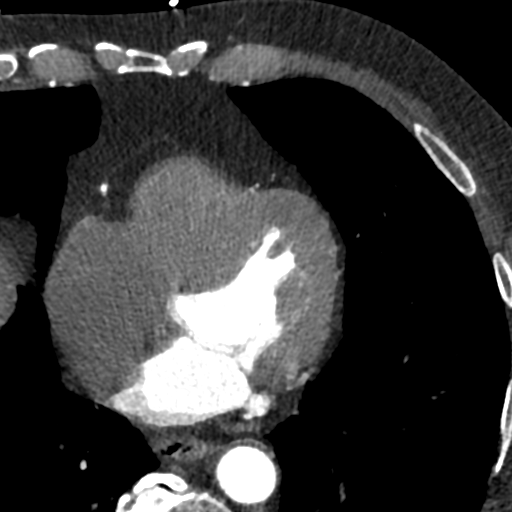
[im 209/313  vessel]
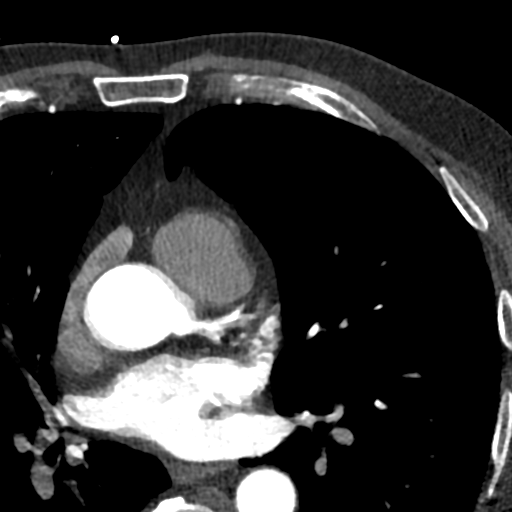

[Series 8: ts diast sharp 71 % · axial · 0.44mm/px · z∈[-207,-166]mm · 2 of 313 slices shown]
[im 105/313  lung]
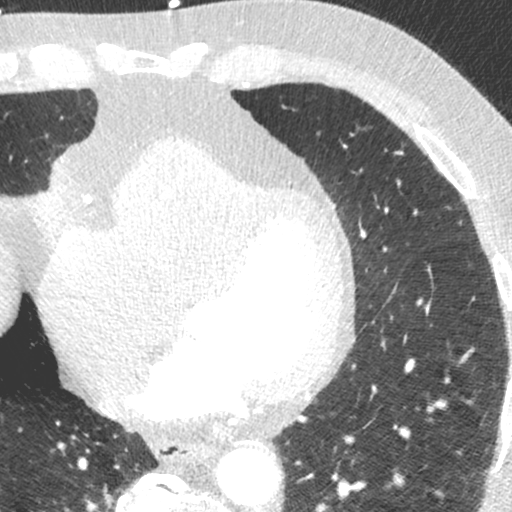
[im 209/313  lung]
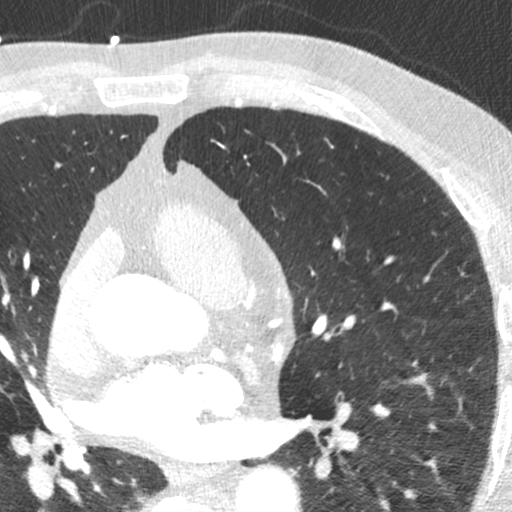

[Series 9: ts syst sharp · axial · 0.44mm/px · z∈[-207,-166]mm · 2 of 313 slices shown]
[im 105/313  lung]
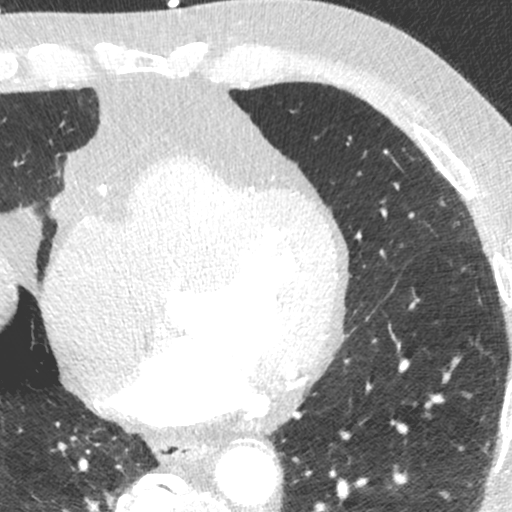
[im 209/313  lung]
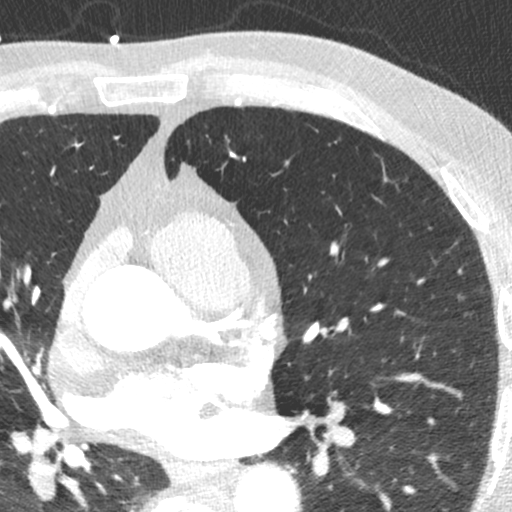

[8 of 20 positions shown; findings below may reference images not displayed]

FINDINGS: Vascular: Normal caliber of the visualized thoracic aorta.

Mediastinum/Nodes: Visualized mediastinal structures are normal.

Lungs/Pleura: Stable scarring in the medial right lower lobe. No
large pleural effusions. Again noted is an oval shaped nodule in the
posterior right lower lobe on sequence 11, image 30 measuring close
to 6 mm. Peripheral nodule in the left lower lobe on sequence 11,
image 38 has a mean diameter of 6 mm and stable. Additional tiny
nodules at the lung bases are similar to the prior examination. No
large areas of airspace disease or consolidation.

Upper Abdomen: Images of the upper abdomen are unremarkable.

Musculoskeletal: Degenerative changes in thoracic spine.
IMPRESSION: 1. No acute abnormality involving the extracardiac structures.
2. Again noted are small pulmonary nodules. The visualized nodules
have not significantly changed since 12/22/2020. Recommend following
the recommendations from the previous examination because the entire
lungs are not imaged on this examination.
FINDINGS: Quality: Fair, HR 45, significant motion artifact

Coronary calcium score: The patient's coronary artery calcium score
is 0, which places the patient in the 0 percentile.

Coronary arteries: Normal coronary origins.  Right dominance.

Right Coronary Artery: Dominant. Notwithstanding motion artifact
seen in all phases, normal vessel. Normal R-PDA and R-PLB branches.

Left Main Coronary Artery: Normal. Bifurcates into the LAD and LCx
arteries.

Left Anterior Descending Coronary Artery: Anterior vessel which
reaches the apex, motion artifact. No disease. Large high diagonal
branch which bifurcates. Minimal proximal non-calcified stenosis
([id number]).

Left Circumflex Artery: Motion artifact, but otherwise normal
vessel. Large mid-vessel OM1 branch without disease.

Aorta: Borderline dilated at 39 mm at the mid ascending aorta (level
of the PA bifurcation) measured double oblique. Aortic
atherosclerosis. No dissection.

Aortic Valve: Trileaflet.  No calcifications.

Other findings:

Normal pulmonary vein drainage into the left atrium.

Normal left atrial appendage without a thrombus.

Borderline dilated main pulmonary artery at 29 mm, suggestive of
possible pulmonary hypertension.
IMPRESSION: 1. Significant motion artifact, but did not impair interpretation.
Minimal non-calcified CAD, CADRADS = 1.

2. Coronary calcium score of 0. This was 0 percentile for age and
sex matched control.

3. Normal coronary origin with right dominance.

4. Aortic atherosclerosis, borderline dilated aorta to 39 mm a the
mid-ascending point (level of the PA bifurcation).

5. Borderline dilated main pulmonary artery at 29 mm, suggestive of
possible pulmonary hypertension.

*** End of Addendum ***
EXAM:
OVER-READ INTERPRETATION  CT CHEST

The following report is an over-read performed by radiologist Dr.
Jul Ansah [REDACTED] on 06/28/2021. This over-read
does not include interpretation of cardiac or coronary anatomy or
pathology. The coronary calcium score/coronary CTA interpretation by
the cardiologist is attached.
FINDINGS: Vascular: Normal caliber of the visualized thoracic aorta.

Mediastinum/Nodes: Visualized mediastinal structures are normal.

Lungs/Pleura: Stable scarring in the medial right lower lobe. No
large pleural effusions. Again noted is an oval shaped nodule in the
posterior right lower lobe on sequence 11, image 30 measuring close
to 6 mm. Peripheral nodule in the left lower lobe on sequence 11,
image 38 has a mean diameter of 6 mm and stable. Additional tiny
nodules at the lung bases are similar to the prior examination. No
large areas of airspace disease or consolidation.

Upper Abdomen: Images of the upper abdomen are unremarkable.

Musculoskeletal: Degenerative changes in thoracic spine.
IMPRESSION: 1. No acute abnormality involving the extracardiac structures.
2. Again noted are small pulmonary nodules. The visualized nodules
have not significantly changed since 12/22/2020. Recommend following
the recommendations from the previous examination because the entire
lungs are not imaged on this examination.

## 2023-03-05 ENCOUNTER — Ambulatory Visit (INDEPENDENT_AMBULATORY_CARE_PROVIDER_SITE_OTHER): Payer: PPO | Admitting: Cardiovascular Disease

## 2023-03-05 ENCOUNTER — Encounter (HOSPITAL_BASED_OUTPATIENT_CLINIC_OR_DEPARTMENT_OTHER): Payer: Self-pay | Admitting: Cardiovascular Disease

## 2023-03-05 VITALS — BP 104/70 | HR 64 | Ht 72.0 in | Wt 239.7 lb

## 2023-03-05 DIAGNOSIS — I700 Atherosclerosis of aorta: Secondary | ICD-10-CM | POA: Diagnosis not present

## 2023-03-05 DIAGNOSIS — I4892 Unspecified atrial flutter: Secondary | ICD-10-CM

## 2023-03-05 DIAGNOSIS — I5042 Chronic combined systolic (congestive) and diastolic (congestive) heart failure: Secondary | ICD-10-CM

## 2023-03-05 DIAGNOSIS — I10 Essential (primary) hypertension: Secondary | ICD-10-CM

## 2023-03-05 NOTE — Patient Instructions (Signed)
Medication Instructions:  Your physician recommends that you continue on your current medications as directed. Please refer to the Current Medication list given to you today.  *If you need a refill on your cardiac medications before your next appointment, please call your pharmacy*  Lab Work: NONE  Testing/Procedures: NONE  Follow-Up: At [facility], you and your health needs are our priority.  As part of our continuing mission to provide you with exceptional heart care, we have created designated Provider Care Teams.  These Care Teams include your primary Cardiologist (physician) and Advanced Practice Providers (APPs -  Physician Assistants and Nurse Practitioners) who all work together to provide you with the care you need, when you need it.  We recommend signing up for the patient portal called "MyChart".  Sign up information is provided on this After Visit Summary.  MyChart is used to connect with patients for Virtual Visits (Telemedicine).  Patients are able to view lab/test results, encounter notes, upcoming appointments, etc.  Non-urgent messages can be sent to your provider as well.   To learn more about what you can do with MyChart, go to [email or web address].    Your next appointment:   6 month(s)  The format for your next appointment:   In Person  Provider:   Dr Oval Linsey

## 2023-03-05 NOTE — Progress Notes (Signed)
Cardiology Office Note:    Date:  03/05/2023   ID:  JAQWAN MICUCCI, DOB 09/09/1951, MRN [id number]  PCP:  Corwin Levins, MD   [facility] Providers Cardiologist:  Chilton Si, MD     Referring MD: Corwin Levins, MD   No chief complaint on file.  History of Present Illness:    [name] is a 72 y.o. male with a hx of aortic atherosclerosis, ascending aortic aneurysm, paroxysmal atrial fibrillation, chronic systolic diastolic heart failure (recovered LVEF), hyperlipidemia, hypertension, obesity, and GERD, here for follow-up. He was first seen in the Atrial fibrillation clinic 12/2020 for atrial flutter. He was started on eliquis and metoprolol. He had a TEE/DCCV 12/2020. The echo revealed LVEF less then 20% with global hypokinesis and a secundum ASD. He followed up with Metta Clines, DNP and was referred for repeat Echo, where his LVEF had improved to 50%.  He reported exertional dyspnea, despite being in sinus rhythm. He had a coronary CTA 05/2021 that showed minimal non-calcified plaque. His calcium score was 0. He had aortic atherosclerosis, and his aorta was 3.9 cm. He had an Echo  01/2022 which revealed LVEF 55-60%, mild LVH, and grade 1 diastolic dysfunction. The aortic root was 4.6 cm, and the ascending aorta was 4.4 cm. He saw [name], NP 01/2022 and was feeling well.   At his visit 08/2022 he was in atrial flutter and was feeling tired. His rate was controlled on metoprolol. He was started on Eliquis and referred for TEE/DCCV 08/2022. LVEF was 60-65%; ascending aorta was 4.5 cm and he was found to have a small PFO. He was successfully cardioverted. He had a sleep study that was negative for OSA. He's had recurrent issues with PAF and was seen in Afib clinic 01/2023 and was referred to EP to discuss ablation.  Last visit, 01/2023 he complained of frequent fatigue and SOB after activity. After his previous cardioversion his symptoms hadn't improved. He complained of  bilateral numbness in his feet. He followed up with Dr. Elberta Fortis on 01/2023 for his atrial flutter where he agreed to ablation. He underwent successful cardioversion on 02/05/2023. He wore a monitor after cardioversion where he maintained sinus rhythm and had 76 episodes of SVT  Today, the patient states that he has still been tired and lethargic. He hasn't felt any better since DCCV.  He has gotten his Lourena Simmonds mobile to work which told him he had been in atrial flutter a few nights ago. He checked the morning after and he had returned to normal rhythm. He hasn't been getting any exercise recently aside from sometimes working in the yard. His wife states that he snores very heavily however his sleep study was negative for OSA. He has recently cut back on sugar and coffee.  He denies any palpitations, chest pain, shortness of breath, or peripheral edema. No lightheadedness, headaches, syncope, orthopnea, or PND.  Past Medical History:  Diagnosis Date   ALLERGIC RHINITIS 12/31/2007   Aortic atherosclerosis (HCC) 08/26/2022   Arthritis    Chronic combined systolic and diastolic heart failure (HCC) 06/14/2021   COUGH, CHRONIC 07/18/2007   DEGENERATIVE JOINT DISEASE 07/18/2007   Extrinsic asthma, unspecified 09/08/2009   GERD 07/18/2007   HYPERLIPIDEMIA 08/14/2010   Hypertension    Impaired glucose tolerance 09/23/2011   OBESITY 07/18/2007   Pneumonia     Past Surgical History:  Procedure Laterality Date   CARDIOVERSION N/A 12/28/2020   Procedure: CARDIOVERSION;  Surgeon: Chilton Si, MD;  Location:  [facility] ENDOSCOPY;  Service: Cardiovascular;  Laterality: N/A;   CARDIOVERSION N/A 09/04/2022   Procedure: CARDIOVERSION;  Surgeon: Jake Bathe, MD;  Location: [facility] ENDOSCOPY;  Service: Cardiovascular;  Laterality: N/A;   CARDIOVERSION N/A 02/05/2023   Procedure: CARDIOVERSION;  Surgeon: Jodelle Red, MD;  Location: [facility] ENDOSCOPY;  Service: Cardiovascular;  Laterality: N/A;   DG FEMUR LEFT   ([facility] HX)     rod placed   LUMBAR DISC SURGERY     TEE WITHOUT CARDIOVERSION N/A 12/28/2020   Procedure: TRANSESOPHAGEAL ECHOCARDIOGRAM (TEE);  Surgeon: Chilton Si, MD;  Location: [facility] ENDOSCOPY;  Service: Cardiovascular;  Laterality: N/A;   TEE WITHOUT CARDIOVERSION N/A 09/04/2022   Procedure: TRANSESOPHAGEAL ECHOCARDIOGRAM (TEE);  Surgeon: Jake Bathe, MD;  Location: [facility] ENDOSCOPY;  Service: Cardiovascular;  Laterality: N/A;   TOOTH EXTRACTION     TOTAL KNEE ARTHROPLASTY Right 07/26/2018   Procedure: RIGHT TOTAL KNEE ARTHROPLASTY;  Surgeon: Kerrin Champagne, MD;  Location: [facility] OR;  Service: Orthopedics;  Laterality: Right;    Current Medications: No outpatient medications have been marked as taking for the 03/05/23 encounter (Appointment) with Chilton Si, MD.     Allergies:   Patient has no known allergies.   Social History   Socioeconomic History   Marital status: Married    Spouse name: Not on file   Number of children: 1   Years of education: Not on file   Highest education level: Not on file  Occupational History   Occupation: truck driver  Tobacco Use   Smoking status: Never   Smokeless tobacco: Never   Tobacco comments:    Marijuana 30 years  Vaping Use   Vaping Use: Never used  Substance and Sexual Activity   Alcohol use: Yes    Alcohol/week: 2.0 - 4.0 standard drinks of alcohol    Types: 2 - 4 Cans of beer per week    Comment: moderate   Drug use: Not Currently    Types: Marijuana    Comment: as a teenager   Sexual activity: Not on file  Other Topics Concern   Not on file  Social History Narrative   Are you right handed or left handed? left   Are you currently employed ?    What is your current occupation?retired   Do you live at home alone?   Who lives with you? Wife and grandson   What type of home do you live in: 1 story or 2 story? one   Caffeine 1-2 a day    Social Determinants of Corporate investment banker Strain: Not on file  Food  Insecurity: Not on file  Transportation Needs: Not on file  Physical Activity: Not on file  Stress: Not on file  Social Connections: Not on file     Family History: The patient's family history includes Cerebral palsy in his sister; Diabetes in his maternal grandmother; Lupus in his father.  ROS:   Please see the history of present illness.    (+) Fatigue/Malaise All other systems reviewed and are negative.  EKGs/Labs/Other Studies Reviewed:    The following studies were reviewed today:  Echo  09/04/2022:  1. PFO 0.3 cm.   2. Left ventricular ejection fraction, by estimation, is 60 to 65%. The  left ventricle has normal function. The left ventricle has no regional  wall motion abnormalities.   3. Right ventricular systolic function is normal. The right ventricular  size is normal.   4. No left atrial/left atrial appendage thrombus was  detected.   5. The mitral valve is normal in structure. Trivial mitral valve  regurgitation. No evidence of mitral stenosis.   6. The aortic valve is normal in structure. Aortic valve regurgitation is  not visualized. No aortic stenosis is present.   7. Aortic dilatation noted. There is mild dilatation of the ascending  aorta, measuring 42 mm. There is mild dilatation of the aortic root,  measuring 45 mm.   8. The inferior vena cava is normal in size with greater than 50%  respiratory variability, suggesting right atrial pressure of 3 mmHg.   9. Evidence of atrial level shunting detected by color flow Doppler.  There is a moderately sized patent foramen ovale with predominantly left  to right shunting across the atrial septum.   Echo 01/03/2022:  1. Left ventricular ejection fraction, by estimation, is 55 to 60%. The  left ventricle has normal function. The left ventricle has no regional  wall motion abnormalities. There is mild concentric left ventricular  hypertrophy of the septal segment. Left  ventricular diastolic parameters are consistent  with Grade I diastolic  dysfunction (impaired relaxation). The average left ventricular global  longitudinal strain is 19.8 %. The global longitudinal strain is normal.   2. Right ventricular systolic function is normal. The right ventricular  size is normal. There is normal pulmonary artery systolic pressure.   3. Left atrial size was mildly dilated.   4. Right atrial size was mildly dilated.   5. The mitral valve is normal in structure. No evidence of mitral valve  regurgitation. No evidence of mitral stenosis.   6. The aortic valve is tricuspid. Aortic valve regurgitation is trivial.  No aortic stenosis is present.   7. Aortic dilatation noted. There is moderate dilatation of the aortic  root, measuring 46 mm. There is moderate dilatation of the ascending  aorta, measuring 44 mm.   8. The inferior vena cava is normal in size with greater than 50%  respiratory variability, suggesting right atrial pressure of 3 mmHg.   CT Chest  09/17/2021: IMPRESSION: 1. Stable small bilateral pulmonary nodules from baseline chest CT of 9 months ago, supporting a benign etiology. This appearance is almost certainly benign, and no dedicated follow-up is required if this patient is low risk for bronchogenic carcinoma (and has no known or suspected primary neoplasm). Non-contrast chest CT can be considered in 12 months if patient is high-risk. This recommendation follows the consensus statement: Guidelines for Management of Incidental Pulmonary Nodules Detected on CT Images: From the Fleischner Society 2017; Radiology 2017; 284:228-243. 2. Mild Aortic Atherosclerosis (ICD10-I70.0) and Emphysema (ICD10-J43.9).  Coronary CTA  06/28/2021: IMPRESSION: 1. Significant motion artifact, but did not impair interpretation. Minimal non-calcified CAD, CADRADS = 1.   2. Coronary calcium score of 0. This was 0 percentile for age and sex matched control.   3. Normal coronary origin with right dominance.   4.  Aortic atherosclerosis, borderline dilated aorta to 39 mm a the mid-ascending point (level of the PA bifurcation).   5. Borderline dilated main pulmonary artery at 29 mm, suggestive of possible pulmonary hypertension.  Echo 01/07/2021:  1. Left ventricular ejection fraction, by estimation, is 50%. The left  ventricle has mildly decreased function. The left ventricle demonstrates  global hypokinesis. Left ventricular diastolic parameters are consistent  with Grade I diastolic dysfunction  (impaired relaxation).   2. Right ventricular systolic function is normal. The right ventricular  size is normal. Tricuspid regurgitation signal is inadequate for assessing  PA pressure.  3. Left atrial size was mildly dilated.   4. Right atrial size was mildly dilated.   5. The mitral valve is normal in structure. No evidence of mitral valve  regurgitation. No evidence of mitral stenosis.   6. The aortic valve is tricuspid. Aortic valve regurgitation is not  visualized. No aortic stenosis is present.   7. Aortic dilatation noted. There is moderate dilatation of the aortic  root, measuring 45 mm.   8. The inferior vena cava is normal in size with greater than 50%  respiratory variability, suggesting right atrial pressure of 3 mmHg.    EKG:  EKG is personally reviewed. 03/05/2023: NSR, 64 bpm 1st degree av block, LAFB 01/20/2023:  Atrial flutter. Rate 63 bpm. 12/23/2022 [name], NP):  Atrial flutter 103 bpm with QTc 521  08/26/2022:  Atrial flutter with variable conduction. Rate 88 bpm. LAD. 06/14/2021: EKG was not ordered.  Recent Labs: 08/20/2022: ALT 20 12/23/2022: Magnesium 2.2; TSH 1.220 02/27/2023: BUN 19; Creatinine, Ser 1.42; Hemoglobin 14.7; Platelets 198; Potassium 4.8; Sodium 142   Recent Lipid Panel    Component Value Date/Time   CHOL 132 08/20/2022 0827   TRIG 96 08/20/2022 0827   HDL 50 08/20/2022 0827   CHOLHDL 2.6 08/20/2022 0827   CHOLHDL 3.7 06/15/2020 1602   VLDL 22.6  02/10/2019 1640   LDLCALC 64 08/20/2022 0827   LDLCALC 109 (H) 06/15/2020 1602   LDLDIRECT 132.9 09/23/2011 1557     Risk Assessment/Calculations:    This patients CHA2DS2-VASc Score and unadjusted Ischemic Stroke Rate (% per year) is equal to 2.2 % stroke rate/year from a score of 2  Above score calculated as 1 point each if present [CHF, HTN, DM, Vascular=MI/PAD/Aortic Plaque, Age if 65-74, or Male] Above score calculated as 2 points each if present [Age > 75, or Stroke/TIA/TE]   STOP-Bang Score:  4       Physical Exam:    VS:  There were no vitals taken for this visit. , BMI There is no height or weight on file to calculate BMI. GENERAL:  Well appearing HEENT: Pupils equal round and reactive, fundi not visualized, oral mucosa unremarkable NECK:  No jugular venous distention, waveform within normal limits, carotid upstroke brisk and symmetric, no bruits, no thyromegaly LUNGS:  Clear to auscultation bilaterally HEART:  Irregularly irregular.   PMI not displaced or sustained,S1 and S2 within normal limits, no S3, no S4, no clicks, no rubs, no murmurs  ABD:  Flat, positive bowel sounds normal in frequency in pitch, no bruits, no rebound, no guarding, no midline pulsatile mass, no hepatomegaly, no splenomegaly EXT:  2 plus pulses throughout, no edema, no cyanosis no clubbing SKIN:  No rashes no nodules NEURO:  Cranial nerves II through XII grossly intact, motor grossly intact throughout PSYCH:  Cognitively intact, oriented to person place and time  ASSESSMENT:    No diagnosis found.   PLAN:    # Persistent atrial fibrillation (AFib): - Currently in sinus rhythm.  He has had episodes of atrial fibrillation but feels no different whether he is in atrial fibrillation or sinus rhythm.   - Continue Eliquis and metoprolol. - Encourage patient to increase exercise and work on weight loss to improve symptoms. - Reassess the need for ablation after reviewing [employer] tracings  and discussing with Dr. Hezzie Bump.  # Pre-diabetes:  A1c 6.0%.  - Continue to reduce sugar and coffee intake. - Consider enrolling in the [facility] program for lifestyle modification and weight loss, which  is covered by the patient's insurance.  # Hyperlipidemia:  Continue rosuvastatin.   Disposition: FU with Winna Golla C. Duke Salvia, MD, [facility] in  6 months  Medication Adjustments/Labs and Tests Ordered: Current medicines are reviewed at length with the patient today.  Concerns regarding medicines are outlined above.   No orders of the defined types were placed in this encounter.  No orders of the defined types were placed in this encounter.  I,Coren O'Brien,acting as a Neurosurgeon for [employer], MD.,have documented all relevant documentation on the behalf of Chilton Si, MD,as directed by  Chilton Si, MD while in the presence of Chilton Si, MD.  I, Kareemah Grounds C. Duke Salvia, MD have reviewed all documentation for this visit.  The documentation of the exam, diagnosis, procedures, and orders on 04/21/2023 are all accurate and complete.

## 2023-03-24 ENCOUNTER — Telehealth: Payer: Self-pay | Admitting: *Deleted

## 2023-03-24 NOTE — Telephone Encounter (Signed)
Pt holding off on ablation for now. He will let office know if things change and need to address. Aware scheduler will call to arrange August follow up for AFLutter. Patient verbalized understanding and agreeable to plan.

## 2023-04-06 ENCOUNTER — Ambulatory Visit (INDEPENDENT_AMBULATORY_CARE_PROVIDER_SITE_OTHER): Payer: PPO | Admitting: Neurology

## 2023-04-06 DIAGNOSIS — R200 Anesthesia of skin: Secondary | ICD-10-CM | POA: Diagnosis not present

## 2023-04-06 DIAGNOSIS — G8929 Other chronic pain: Secondary | ICD-10-CM

## 2023-04-06 DIAGNOSIS — M5417 Radiculopathy, lumbosacral region: Secondary | ICD-10-CM

## 2023-04-06 DIAGNOSIS — R7303 Prediabetes: Secondary | ICD-10-CM

## 2023-04-06 NOTE — Telephone Encounter (Signed)
Discussed the results of patient's EMG after the procedure today. It showed the residuals of bilateral L5 and S1 radiculopathy. There was no electrodiagnostic evidence of a large fiber sensorimotor neuropathy.   All questions were answered.  Jacquelyne Balint, MD [facility]

## 2023-04-06 NOTE — Procedures (Signed)
[facility]  [address] [place], [address]  [place], [place] [address] Tel: [phone] Fax: [phone] Test Date:  04/06/2023  Patient: [name] DOB: 1951/05/04 Physician: Jacquelyne Balint, MD  Sex: Male Height: 6\' 0"  Ref Phys: Jacquelyne Balint, MD  ID#: [id number]   Technician:    History: This is a 72 year old male with numbness in his feet.  NCV & EMG Findings: Extensive electrodiagnostic evaluation of the right lower limb with additional nerve conduction studies and needle examination of the left lower limb shows: Right sural and superficial peroneal/fibular sensory responses are within normal limits. Right peroneal/fibular (EDB) motor response is absent. Left peroneal/fibular (EDB) motor response shows reduced amplitude (1.21 mV). Bilateral tibial (AH) motor responses show reduced amplitude (left 2.5, right 2.4 mV). Bilateral peroneal/fibular (TA) motor responses are within normal limits. Right H reflex is absent. Chronic motor axon loss changes without active denervation changes are seen in bilateral tibialis anterior, bilateral medial head of gastrocnemius, right short head of biceps femoris, and right gluteus medius muscles. Lumbosacral paraspinal muscles were deferred due to patient being on anticoagulation.  Impression: This is an abnormal study. The findings are most consistent with the following: The residuals of old intraspinal canal lesion(s) (ie: motor radiculopathy) at bilateral L5 and bilateral S1 roots or segments, mild in degree electrically. No electrodiagnostic evidence of a large fiber sensorimotor neuropathy.    ___________________________ Jacquelyne Balint, MD    Nerve Conduction Studies Motor Nerve Results    Latency Amplitude F-Lat Segment Distance CV Comment  Site (ms) Norm (mV) Norm (ms)  (cm) (m/s) Norm   Left Fibular (EDB) Motor  Ankle 3.4  < 6.0 *1.21  > 2.5        Bel fib head 12.5 - 0.84 -  Bel fib head-Ankle 36 40  > 40   Pop fossa 14.7 -  0.74 -  Pop fossa-Bel fib head 9 41 -   Right Fibular (EDB) Motor  Ankle *NR  < 6.0 *NR  > 2.5        Bel fib head *NR - *NR -  Bel fib head-Ankle - *NR  > 40   Pop fossa *NR - *NR -  Pop fossa-Bel fib head - *NR -   Left Fibular (TA) Motor  Fib head 2.6  < 4.5 4.4  > 3.0        Pop fossa 4.6  < 6.7 4.3 -  Pop fossa-Fib head 9 45  > 40   Right Fibular (TA) Motor  Fib head 2.5  < 4.5 3.3  > 3.0        Pop fossa 4.0  < 6.7 3.3 -  Pop fossa-Fib head 9 60  > 40   Left Tibial (AH) Motor  Ankle 4.0  < 6.0 *2.5  > 4.0        Knee 14.8 - 1.84 -  Knee-Ankle 44 41  > 40   Right Tibial (AH) Motor  Ankle 4.9  < 6.0 *2.4  > 4.0        Knee 15.8 - 0.84 -  Knee-Ankle 46 42  > 40    Sensory Sites    Neg Peak Lat Amplitude (O-P) Segment Distance Velocity Comment  Site (ms) Norm (V) Norm  (cm) (ms)   Right Superficial Fibular Sensory  14 cm-Ankle 2.9  < 4.6 4  > 3 14 cm-Ankle 14    Right Sural Sensory  Calf-Lat mall 4.2  < 4.6 5  > 3  Calf-Lat mall 14     H-Reflex Results    M-Lat H Lat H Neg Amp H-M Lat  Site (ms) (ms) Norm (mV) (ms)  Right Tibial H-Reflex  Pop fossa 4.8 ---  < 35.0 --- ---   Electromyography   Side Muscle Ins.Act Fibs Fasc Recrt Amp Dur Poly Activation Comment  Right Tib ant Nml Nml Nml *1- *1+ *1+ *1+ Nml N/A  Right Gastroc MH Nml Nml Nml *1- *1+ *1+ *1+ Nml N/A  Right Rectus fem Nml Nml Nml Nml Nml Nml Nml Nml N/A  Right Biceps fem SH Nml Nml Nml *1- *1+ *1+ *1+ Nml N/A  Right Gluteus med Nml Nml Nml *2- *1+ *1+ *1+ Nml N/A  Left Tib ant Nml Nml Nml *1- *1+ *1+ *1+ Nml N/A  Left Gastroc MH Nml Nml Nml *1- *1+ *1+ *1+ Nml N/A      Waveforms:  Motor               Sensory      H-Reflex

## 2023-04-07 ENCOUNTER — Telehealth: Payer: Self-pay | Admitting: Physical Medicine and Rehabilitation

## 2023-04-07 ENCOUNTER — Other Ambulatory Visit: Payer: Self-pay | Admitting: Physical Medicine and Rehabilitation

## 2023-04-07 DIAGNOSIS — G8929 Other chronic pain: Secondary | ICD-10-CM

## 2023-04-07 NOTE — Telephone Encounter (Signed)
See previous encounter

## 2023-04-07 NOTE — Telephone Encounter (Signed)
Patient calling for right shoulder injection. Last injection was 12/09/22 and gave more than 75% relief. No new falls, accidents or injuries. Please advise

## 2023-04-07 NOTE — Telephone Encounter (Signed)
Patient needing an injection in his shoulder please advise

## 2023-04-07 NOTE — Telephone Encounter (Signed)
Pt states he is returning call to [place] [facility] to set an appt. Please call pt at [phone].

## 2023-04-07 NOTE — Progress Notes (Signed)
Patient reports greater than 75% relief of pain with prior right glenohumeral injection on 12/09/2022.

## 2023-04-13 ENCOUNTER — Other Ambulatory Visit: Payer: Self-pay

## 2023-04-13 ENCOUNTER — Ambulatory Visit: Payer: PPO | Admitting: Physical Medicine and Rehabilitation

## 2023-04-13 DIAGNOSIS — M5441 Lumbago with sciatica, right side: Secondary | ICD-10-CM | POA: Diagnosis not present

## 2023-04-13 DIAGNOSIS — M25511 Pain in right shoulder: Secondary | ICD-10-CM

## 2023-04-13 DIAGNOSIS — M961 Postlaminectomy syndrome, not elsewhere classified: Secondary | ICD-10-CM | POA: Diagnosis not present

## 2023-04-13 DIAGNOSIS — R202 Paresthesia of skin: Secondary | ICD-10-CM

## 2023-04-13 DIAGNOSIS — G894 Chronic pain syndrome: Secondary | ICD-10-CM

## 2023-04-13 DIAGNOSIS — G8929 Other chronic pain: Secondary | ICD-10-CM

## 2023-04-13 DIAGNOSIS — M19011 Primary osteoarthritis, right shoulder: Secondary | ICD-10-CM

## 2023-04-13 MED ORDER — GABAPENTIN 300 MG PO CAPS: 300.0000 mg | ORAL_CAPSULE | Freq: Every day | ORAL | 3 refills | Status: DC

## 2023-04-13 NOTE — Progress Notes (Unsigned)
PANTH HIEB - 72 y.o. male MRN [id number]  Date of birth: 1951/07/13  Office Visit Note: Visit Date: 04/13/2023 PCP: Corwin Levins, MD Referred by: Corwin Levins, MD  Subjective: Chief Complaint  Patient presents with   Right Shoulder - Pain   HPI: [name] is a 72 y.o. male who comes in today for evaluation and    {Oswestry Disability Score:26558}  ROS Otherwise per HPI.  Assessment & Plan: Visit Diagnoses:    ICD-10-CM   1. Chronic right shoulder pain  M25.511 XR C-ARM NO REPORT   G89.29     2. Primary osteoarthritis, right shoulder  M19.011 XR C-ARM NO REPORT    3. Paresthesia of skin  R20.2     4. Chronic right-sided low back pain with right-sided sciatica  M54.41    G89.29     5. Chronic pain syndrome  G89.4     6. Post laminectomy syndrome  M96.1        Plan: No additional findings.   Meds & Orders: No orders of the defined types were placed in this encounter.   Orders Placed This Encounter  Procedures   XR C-ARM NO REPORT    Follow-up: No follow-ups on file.   Procedures: Large Joint Inj: R glenohumeral on 04/13/2023 10:20 AM Indications: pain and diagnostic evaluation Details: 22 G 3.5 in needle, fluoroscopy-guided anteromedial approach  Arthrogram: No  Medications: 40 mg triamcinolone acetonide 40 MG/ML; 5 mL bupivacaine 0.25 % Outcome: tolerated well, no immediate complications  There was excellent flow of contrast producing a partial arthrogram of the glenohumeral joint. The patient did have relief of symptoms during the anesthetic phase of the injection. Procedure, treatment alternatives, risks and benefits explained, specific risks discussed. Consent was given by the patient. Immediately prior to procedure a time out was called to verify the correct patient, procedure, equipment, support staff and site/side marked as required. Patient was prepped and draped in the usual sterile fashion.          Clinical History: No specialty  comments available.   He reports that he has never smoked. He has never used smokeless tobacco.  Recent Labs    02/20/23 1034  HGBA1C 6.0    Objective:  VS:  HT:    WT:   BMI:     BP:   HR: bpm  TEMP: ( )  RESP:  Physical Exam  Ortho Exam  Imaging: No results found.  Past Medical/Family/Surgical/Social History: Medications & Allergies reviewed per EMR, new medications updated. Patient Active Problem List   Diagnosis Date Noted   Aortic atherosclerosis (HCC) 08/26/2022   Partial intestinal obstruction (HCC) 04/07/2022   Chronic combined systolic and diastolic heart failure (HCC) 06/14/2021   Atrial flutter (HCC) 12/24/2020   Secondary hypercoagulable state (HCC) 12/24/2020   Tachycardia 12/21/2020   DOE (dyspnea on exertion) 12/21/2020   Anemia due to blood loss, acute 07/29/2018    Class: Acute   DVT (deep venous thrombosis) (HCC) 07/28/2018   Unilateral primary osteoarthritis, right knee 07/26/2018    Class: End Stage   S/P TKR (total knee replacement) using cement, right 07/26/2018   Flu-like symptoms 01/23/2018   Dyspnea 01/07/2018   HTN (hypertension) 10/30/2016   Cough 10/30/2016   Asthma with exacerbation 10/30/2016   Edema 11/22/2015   Erectile dysfunction 01/17/2015   Corneal abrasion, right, sequela 06/27/2014   Preventative health care 09/23/2011   Impaired glucose tolerance 09/23/2011   Hyperlipidemia 08/14/2010   Extrinsic asthma  09/08/2009   ALLERGIC RHINITIS 12/31/2007   OBESITY 07/18/2007   GERD 07/18/2007   DEGENERATIVE JOINT DISEASE 07/18/2007   Past Medical History:  Diagnosis Date   ALLERGIC RHINITIS 12/31/2007   Aortic atherosclerosis (HCC) 08/26/2022   Arthritis    Chronic combined systolic and diastolic heart failure (HCC) 06/14/2021   COUGH, CHRONIC 07/18/2007   DEGENERATIVE JOINT DISEASE 07/18/2007   Extrinsic asthma, unspecified 09/08/2009   GERD 07/18/2007   HYPERLIPIDEMIA 08/14/2010   Hypertension    Impaired glucose  tolerance 09/23/2011   OBESITY 07/18/2007   Pneumonia    Family History  Problem Relation Age of Onset   Lupus Father    Cerebral palsy Sister    Diabetes Maternal Grandmother    Past Surgical History:  Procedure Laterality Date   CARDIOVERSION N/A 12/28/2020   Procedure: CARDIOVERSION;  Surgeon: Chilton Si, MD;  Location: [facility] ENDOSCOPY;  Service: Cardiovascular;  Laterality: N/A;   CARDIOVERSION N/A 09/04/2022   Procedure: CARDIOVERSION;  Surgeon: Jake Bathe, MD;  Location: [facility] ENDOSCOPY;  Service: Cardiovascular;  Laterality: N/A;   CARDIOVERSION N/A 02/05/2023   Procedure: CARDIOVERSION;  Surgeon: Jodelle Red, MD;  Location: [facility] ENDOSCOPY;  Service: Cardiovascular;  Laterality: N/A;   DG FEMUR LEFT  ([facility] HX)     rod placed   LUMBAR DISC SURGERY     TEE WITHOUT CARDIOVERSION N/A 12/28/2020   Procedure: TRANSESOPHAGEAL ECHOCARDIOGRAM (TEE);  Surgeon: Chilton Si, MD;  Location: [facility] ENDOSCOPY;  Service: Cardiovascular;  Laterality: N/A;   TEE WITHOUT CARDIOVERSION N/A 09/04/2022   Procedure: TRANSESOPHAGEAL ECHOCARDIOGRAM (TEE);  Surgeon: Jake Bathe, MD;  Location: [facility] ENDOSCOPY;  Service: Cardiovascular;  Laterality: N/A;   TOOTH EXTRACTION     TOTAL KNEE ARTHROPLASTY Right 07/26/2018   Procedure: RIGHT TOTAL KNEE ARTHROPLASTY;  Surgeon: Kerrin Champagne, MD;  Location: [facility] OR;  Service: Orthopedics;  Laterality: Right;   Social History   Occupational History   Occupation: truck Hospital doctor  Tobacco Use   Smoking status: Never   Smokeless tobacco: Never   Tobacco comments:    Marijuana 30 years  Vaping Use   Vaping Use: Never used  Substance and Sexual Activity   Alcohol use: Yes    Alcohol/week: 2.0 - 4.0 standard drinks of alcohol    Types: 2 - 4 Cans of beer per week    Comment: moderate   Drug use: Not Currently    Types: Marijuana    Comment: as a teenager   Sexual activity: Not on file

## 2023-04-13 NOTE — Progress Notes (Signed)
Functional Pain Scale - descriptive words and definitions  Uncomfortable (3)  Pain is present but can complete all ADL's/sleep is slightly affected and passive distraction only gives marginal relief. Mild range order  Average Pain  varies   +Driver, -BT, -Dye Allergies.  Right shoulder pain

## 2023-04-21 ENCOUNTER — Encounter (HOSPITAL_BASED_OUTPATIENT_CLINIC_OR_DEPARTMENT_OTHER): Payer: Self-pay | Admitting: Cardiovascular Disease

## 2023-04-22 ENCOUNTER — Encounter: Payer: Self-pay | Admitting: Physical Medicine and Rehabilitation

## 2023-04-22 MED ORDER — BUPIVACAINE HCL 0.25 % IJ SOLN
5.0000 mL | INTRAMUSCULAR | Status: AC | PRN
  Administered 2023-04-13: 5 mL via INTRA_ARTICULAR

## 2023-04-22 MED ORDER — TRIAMCINOLONE ACETONIDE 40 MG/ML IJ SUSP
40.0000 mg | INTRAMUSCULAR | Status: AC | PRN
  Administered 2023-04-13: 40 mg via INTRA_ARTICULAR

## 2023-05-31 ENCOUNTER — Other Ambulatory Visit (HOSPITAL_BASED_OUTPATIENT_CLINIC_OR_DEPARTMENT_OTHER): Payer: Self-pay | Admitting: Cardiovascular Disease

## 2023-06-01 NOTE — Telephone Encounter (Signed)
Rx(s) sent to pharmacy electronically.  

## 2023-06-30 DIAGNOSIS — T887XXA Unspecified adverse effect of drug or medicament, initial encounter: Secondary | ICD-10-CM | POA: Diagnosis not present

## 2023-06-30 DIAGNOSIS — S90861A Insect bite (nonvenomous), right foot, initial encounter: Secondary | ICD-10-CM | POA: Diagnosis not present

## 2023-06-30 DIAGNOSIS — S20469A Insect bite (nonvenomous) of unspecified back wall of thorax, initial encounter: Secondary | ICD-10-CM | POA: Diagnosis not present

## 2023-06-30 DIAGNOSIS — S80862A Insect bite (nonvenomous), left lower leg, initial encounter: Secondary | ICD-10-CM | POA: Diagnosis not present

## 2023-06-30 DIAGNOSIS — L821 Other seborrheic keratosis: Secondary | ICD-10-CM | POA: Diagnosis not present

## 2023-06-30 DIAGNOSIS — S80861A Insect bite (nonvenomous), right lower leg, initial encounter: Secondary | ICD-10-CM | POA: Diagnosis not present

## 2023-06-30 DIAGNOSIS — S30861A Insect bite (nonvenomous) of abdominal wall, initial encounter: Secondary | ICD-10-CM | POA: Diagnosis not present

## 2023-06-30 DIAGNOSIS — S90862A Insect bite (nonvenomous), left foot, initial encounter: Secondary | ICD-10-CM | POA: Diagnosis not present

## 2023-06-30 DIAGNOSIS — L570 Actinic keratosis: Secondary | ICD-10-CM | POA: Diagnosis not present

## 2023-06-30 DIAGNOSIS — L298 Other pruritus: Secondary | ICD-10-CM | POA: Diagnosis not present

## 2023-07-21 ENCOUNTER — Ambulatory Visit: Payer: PPO | Admitting: Cardiology

## 2023-08-19 NOTE — Progress Notes (Signed)
I saw [name] in neurology clinic on 09/02/23 in follow up for numbness in feet.  HPI: [name] is a 72 y.o. year old male with a history of ascending aortic aneurysm, aortic atherosclerosis, pAfib, pre-diabetes, CHF, HTN, HLD, obesity, GERD, OA, lumbar stenosis s/p surgery (~2004) who we last saw on 02/20/23.  To briefly review: 02/20/23: Patient has had symptoms for several years. He has some mild numbness in bilateral feet, more in the toes. He has to be careful when driving to make sure he feet are on the pedals. He denies any pain. He does endorses chronic back pain. He sometimes gets radiating, burning pain on right buttocks area. He denies any difficulty with ambulation, imbalance, and has had no falls.   The patient does not report symptoms referable to autonomic dysfunction including impaired sweating, heat or cold intolerance, excessive mucosal dryness, gastroparetic early satiety, postprandial abdominal bloating, constipation, bowel or bladder dyscontrol, or syncope/presyncope/orthostatic intolerance.   He does not report any constitutional symptoms like fever, night sweats, anorexia or unintentional weight loss.   He had an EMG in 2020 that showed b/l L5-S1 radiculopathy, right fibular mononeuropathy, possible PN per report.   Patient is on gabapentin 300 mg qhs. He does not have the radiating back pain as much, so he thinks it may help.   Patient feels tired all the time. He does not have the energy to do much. He thinks this is related to his heart. Patient thinks he sleeps okay. He wakes up a lot during night. He thinks he sleeps about 6-7 hours. He falls asleep on the couch watching TV and then will go to bed. He endorses snoring. He had an in home oxygen study that did not show clear sleep apnea.    EtOH use: 1 drink every few days  Restrictive diet? No Family history of neuropathy/myopathy/neurologic disease? No  Most recent Assessment and Plan  (02/20/23): [name] is a 72 y.o. male who presents for evaluation of numbness in bilateral feet. He has a relevant medical history of ascending aortic aneurysm, aortic atherosclerosis, pAfib, pre-diabetes, CHF, HTN, HLD, obesity, GERD, OA, lumbar stenosis s/p surgery (~2004). His neurological examination is pertinent for diminished sensation in bilateral lower extremities in a length dependent pattern. Available diagnostic data is significant for HbA1c of 5.8, B12 542. MRI lumbar spine from 2017 showed multilevel stenosis. The etiology of patient's symptoms is currently unclear. He exam is most consistent with a distal symmetric polyneuropathy, with only known risk factor being pre-diabetes. He has a history of lumbar radiculopathy which is also likely contributing. An EMG from 2020 also mentioned a right peroneal/fibular neuropathy, which is not clearly present by exam today. I will get labs to look for treatable causes and repeat EMG to clarify.   PLAN: -Blood work: B1, B12, folate, IFE, HbA1c -Repeat EMG: PN (R > L) -Gabapentin 300 mg qhs   -Return to clinic in 6 months  Since their last visit: Labs were significant for HbA1c of 6.0 and borderline low B12. I recommended B12 1000 mcg daily. Patient is taking this  EMG on 04/06/23 showed the residuals of an old bilateral L5-S1 radiculopathy, mild in degree electrically. There was no evidence of a large fiber neuropathy.  Patient continues to have numbness in both feet. He thinks it is about the same as prior. He denies significant pain. He continues gabapentin 300 mg at night. He does not know if this helps.  Patient mentions that wife  noticed his jaw moving when she was talking to him and wanted him to ask about this. Patient and his friends do no notice. Patient denies tremor.   MEDICATIONS:  Outpatient Encounter Medications as of 09/02/2023  Medication Sig   acetaminophen (TYLENOL) 650 MG CR tablet Take 650 mg by mouth daily.    albuterol (PROAIR HFA) 108 (90 Base) MCG/ACT inhaler Inhale 2 puffs into the lungs every 6 (six) hours as needed for wheezing or shortness of breath.   apixaban (ELIQUIS) 5 MG TABS tablet Take 1 tablet (5 mg total) by mouth 2 (two) times daily.   budesonide-formoterol (SYMBICORT) 80-4.5 MCG/ACT inhaler Inhale 2 puffs into the lungs 2 (two) times daily. (Patient taking differently: Inhale 2 puffs into the lungs 2 (two) times daily as needed (Wheezing / SOB).)   cyanocobalamin (VITAMIN B12) 1000 MCG tablet Take 1,000 mcg by mouth daily.   diclofenac Sodium (VOLTAREN) 1 % GEL Apply 1 Application topically 4 (four) times daily as needed (pain).   esomeprazole (NEXIUM) 40 MG capsule Take 1 capsule (40 mg total) by mouth daily at 12 noon. Take 1 Daily (Patient taking differently: Take 40 mg by mouth daily as needed (acid reflux).)   gabapentin (NEURONTIN) 300 MG capsule Take 1 capsule (300 mg total) by mouth at bedtime. 1 capsule at bedtime   Melatonin 10 MG TABS Take 10 mg by mouth at bedtime as needed (sleep).   metoprolol succinate (TOPROL-XL) 25 MG 24 hr tablet Take 1.5 tablets (37.5 mg total) by mouth daily. (Patient taking differently: Take 37.5 mg by mouth at bedtime.)   Multiple Vitamins-Minerals (CENTRUM ADULTS PO) Take 1 tablet by mouth 3 (three) times a week.   oxymetazoline (AFRIN) 0.05 % nasal spray Place 1 spray into both nostrils 2 (two) times daily as needed for congestion.   rosuvastatin (CRESTOR) 20 MG tablet TAKE 1 TABLET BY MOUTH EVERY DAY IN THE EVENING   No facility-administered encounter medications on file as of 09/02/2023.    PAST MEDICAL HISTORY: Past Medical History:  Diagnosis Date   ALLERGIC RHINITIS 12/31/2007   Aortic atherosclerosis (HCC) 08/26/2022   Arthritis    Chronic combined systolic and diastolic heart failure (HCC) 06/14/2021   COUGH, CHRONIC 07/18/2007   DEGENERATIVE JOINT DISEASE 07/18/2007   Extrinsic asthma, unspecified 09/08/2009   GERD 07/18/2007    HYPERLIPIDEMIA 08/14/2010   Hypertension    Impaired glucose tolerance 09/23/2011   OBESITY 07/18/2007   Pneumonia     PAST SURGICAL HISTORY: Past Surgical History:  Procedure Laterality Date   CARDIOVERSION N/A 12/28/2020   Procedure: CARDIOVERSION;  Surgeon: Chilton Si, MD;  Location: [facility] ENDOSCOPY;  Service: Cardiovascular;  Laterality: N/A;   CARDIOVERSION N/A 09/04/2022   Procedure: CARDIOVERSION;  Surgeon: Jake Bathe, MD;  Location: [facility] ENDOSCOPY;  Service: Cardiovascular;  Laterality: N/A;   CARDIOVERSION N/A 02/05/2023   Procedure: CARDIOVERSION;  Surgeon: Jodelle Red, MD;  Location: [facility] ENDOSCOPY;  Service: Cardiovascular;  Laterality: N/A;   DG FEMUR LEFT  ([facility] HX)     rod placed   LUMBAR DISC SURGERY     TEE WITHOUT CARDIOVERSION N/A 12/28/2020   Procedure: TRANSESOPHAGEAL ECHOCARDIOGRAM (TEE);  Surgeon: Chilton Si, MD;  Location: [facility] ENDOSCOPY;  Service: Cardiovascular;  Laterality: N/A;   TEE WITHOUT CARDIOVERSION N/A 09/04/2022   Procedure: TRANSESOPHAGEAL ECHOCARDIOGRAM (TEE);  Surgeon: Jake Bathe, MD;  Location: [facility] ENDOSCOPY;  Service: Cardiovascular;  Laterality: N/A;   TOOTH EXTRACTION     TOTAL KNEE ARTHROPLASTY Right 07/26/2018  Procedure: RIGHT TOTAL KNEE ARTHROPLASTY;  Surgeon: Kerrin Champagne, MD;  Location: [facility] OR;  Service: Orthopedics;  Laterality: Right;    ALLERGIES: No Known Allergies  FAMILY HISTORY: Family History  Problem Relation Age of Onset   Lupus Father    Cerebral palsy Sister    Diabetes Maternal Grandmother     SOCIAL HISTORY: Social History   Tobacco Use   Smoking status: Never   Smokeless tobacco: Never   Tobacco comments:    Marijuana 30 years  Vaping Use   Vaping status: Never Used  Substance Use Topics   Alcohol use: Yes    Alcohol/week: 2.0 - 4.0 standard drinks of alcohol    Types: 2 - 4 Cans of beer per week    Comment: moderate   Drug use: Not Currently    Types: Marijuana    Comment: as  a teenager   Social History   Social History Narrative   Are you right handed or left handed? left   Are you currently employed ?    What is your current occupation?retired   Do you live at home alone?   Who lives with you? Wife and grandson   What type of home do you live in: 1 story or 2 story? one   Caffeine 1-2 a day     Objective:  Vital Signs:  BP 115/80   Pulse 61   Ht 6' (1.829 m)   Wt 240 lb (108.9 kg)   SpO2 95%   BMI 32.55 kg/m   General: No acute distress.  Patient appears well-groomed.   Head:  Normocephalic/atraumatic Neck: supple Lungs: Non-labored breathing on room air   Neurological Exam: Mental status: alert and oriented, speech fluent and not dysarthric, language intact.  Cranial nerves: CN I: not tested CN II: pupils equal, round and reactive to light, visual fields intact CN III, IV, VI:  full range of motion, no nystagmus, no ptosis CN V: facial sensation intact. CN VII: upper and lower face symmetric CN VIII: hearing intact CN IX, X: uvula midline CN XI: sternocleidomastoid and trapezius muscles intact CN XII: tongue midline  Bulk & Tone: normal, no fasciculations. No abnormal movements observed, including in jaw. Motor:  muscle strength 5/5 throughout Deep Tendon Reflexes:  2+ throughout except 1+ at bilateral ankles,  toes downgoing.   Sensation:  Pinprick sensation intact. Finger to nose testing:  Without dysmetria.    Gait:  Normal station and stride.   Lab and Test Review: New results: 02/20/23: HbA1c: 6.0 IFE: no M protein Folate wnl B12: 255  EMG (04/06/23): NCV & EMG Findings: Extensive electrodiagnostic evaluation of the right lower limb with additional nerve conduction studies and needle examination of the left lower limb shows: Right sural and superficial peroneal/fibular sensory responses are within normal limits. Right peroneal/fibular (EDB) motor response is absent. Left peroneal/fibular (EDB) motor response shows  reduced amplitude (1.21 mV). Bilateral tibial (AH) motor responses show reduced amplitude (left 2.5, right 2.4 mV). Bilateral peroneal/fibular (TA) motor responses are within normal limits. Right H reflex is absent. Chronic motor axon loss changes without active denervation changes are seen in bilateral tibialis anterior, bilateral medial head of gastrocnemius, right short head of biceps femoris, and right gluteus medius muscles. Lumbosacral paraspinal muscles were deferred due to patient being on anticoagulation.   Impression: This is an abnormal study. The findings are most consistent with the following: The residuals of old intraspinal canal lesion(s) (ie: motor radiculopathy) at bilateral L5 and bilateral S1  roots or segments, mild in degree electrically. No electrodiagnostic evidence of a large fiber sensorimotor neuropathy.  Previously reviewed results: 02/02/23: BMP: Cr 1.46 CBC: MCV 102   TSH (12/23/22): 1.22   09/19/22: B12: 542 Folate: > 20 Vit D: 59   A1c (04/03/22): 5.8   Imaging: MRI lumbar spine wo contrast (05/26/2016): FINDINGS: Segmentation:  5 lumbar type vertebral bodies.   Alignment: No curvature. 4 mm anterolisthesis at L4-5. 2 mm anterolisthesis at L2-3.   Vertebrae: No focal bone lesion. Degenerative endplate changes at L5-S1.   Conus medullaris: Extends to the L1 level and appears normal.   Paraspinal and other soft tissues: No significant finding.   Disc levels:   T12-L1:  Normal.   L1-2: Desiccation and bulging of the disc with a shallow left posterior lateral disc herniation that indents the thecal sac. Narrowing of the left lateral recess could cause neural compression. This has worsened since the previous study.   L2-3: Bilateral facet degeneration and hypertrophy. 2 mm of anterolisthesis. Mild bulging of the disc. Very shallow left-sided protrusion superimposed. Mild narrowing of the lateral recesses without visible neural compression. Similar  when compared to the previous study.   L3-4: Mild bulging of the disc. Mild facet and ligamentous hypertrophy. Mild lateral recess narrowing without visible neural compression.   L4-5: Advanced bilateral facet arthropathy with 4 mm of anterolisthesis. Circumferential bulging of the disc. Stenosis of both lateral recesses that could cause neural compression on either or both sides. Moderate foraminal narrowing right more than left. Facet edema could be associated with pain. Spinous process abutment could be associated with pain. Findings have worsened considerably since the previous study at this level.   L5-S1: Chronic disc degeneration with desiccation and loss of height. Chronic endplate cystic changes. Mild bulging of the disc. Mild facet degeneration. Mild narrowing of the subarticular lateral recesses without visible neural compression. Mild foraminal narrowing without visible neural compression. Degenerative changes at this level have worsened somewhat since previous study.   IMPRESSION: The dominant finding is worsening of degenerative changes at the L4-5 level. There is advanced facet arthropathy with hypertrophic degenerative change allowing anterolisthesis of 4 mm. Facet joints are edematous and could be painful. The disc bulges circumferentially. There is spinal stenosis that could cause neural compression in either or both lateral recesses or neural foramina. This appearance could worsen with standing or flexion.   L2-3 facet arthropathy with 2 mm of anterolisthesis. Bulging of the disc and a shallow left posterior lateral disc protrusion. Mild stenosis of both lateral recesses.   L1-2 shallow disc herniation in the left posterior lateral direction with narrowing of left lateral recess that could be symptomatic. This has worsened since the previous study.   Worsened disc degeneration at L5-S1 with endplate changes. Mild facet degeneration. No visible neural  compression at this level, but the findings could contribute to low back pain.   EMG (08/16/2019 by Dr. Naaman Plummer): EMG & NCV Findings: Evaluation of the left fibular motor, the left tibial motor, the right tibial motor, and the right superficial fibular sensory nerves showed reduced amplitude (L0.3, L2.1, R1.9, R0.7 V).  The right fibular motor nerve showed reduced amplitude (0.7 mV), decreased conduction velocity (B Fib-Ankle, 37 m/s), and decreased conduction velocity (Poplt-B Fib, 26 m/s).  The left saphenous sensory nerve showed no response (14cm).  The left superficial fibular sensory nerve showed no response (14 cm).  The left sural sensory nerve showed prolonged distal peak latency (4.3 ms) and decreased conduction  velocity (Calf-Lat Mall, 33 m/s).  The right sural sensory nerve showed prolonged distal peak latency (5.1 ms), reduced amplitude (3.7 V), and decreased conduction velocity (Calf-Lat Mall, 27 m/s).  All remaining nerves (as indicated in the following tables) were within normal limits.  All left vs. right side differences were within normal limits.     Needle evaluation of the left Fibularis Longus muscle showed increased insertional activity and slightly increased spontaneous activity.  The left medial gastrocnemius muscle showed increased insertional activity, moderately increased spontaneous activity, and diminished recruitment.  The right Fibularis Longus muscle showed increased insertional activity and diminished recruitment.  The right medial gastrocnemius muscle showed increased insertional activity.  All remaining muscles (as indicated in the following table) showed no evidence of electrical instability.     Impression: The above electrodiagnostic study is ABNORMAL and somewhat difficult to interpret but does reveal evidence of:   1.  A mild/moderate chronic L5 and S1 radiculopathy on the right and left.     2. An at least  moderate right fibular nerve neuropathy at or  above the knee affecting sensory and motor components.    3.  A possible underlying sensory predominant axonal more than demyelinating polyneuropathy.  This is not diagnostic and difficult to ascertain.   Recommendations: 1.  Follow-up with referring physician. 2.  Continue current management of symptoms.  If felt to be more of a polyneuropathy then referral for consultation with neurology.  ASSESSMENT: This is [name], a 72 y.o. male with numbness in feet. EMG showed the residuals of bilateral L5-S1 radiculopathy. His B12 was also borderline low. Patient's symptoms are currently stable.  Plan: -Discussed with patient that he could trial stopping gabapentin. If symptoms of burning or tingling or pain return, would continue gabapentin 300 mg at bedtime  -Continue B12 1000 mcg daily  Return to clinic as needed  Jacquelyne Balint, MD

## 2023-08-25 DIAGNOSIS — D225 Melanocytic nevi of trunk: Secondary | ICD-10-CM | POA: Diagnosis not present

## 2023-08-25 DIAGNOSIS — L814 Other melanin hyperpigmentation: Secondary | ICD-10-CM | POA: Diagnosis not present

## 2023-08-25 DIAGNOSIS — L570 Actinic keratosis: Secondary | ICD-10-CM | POA: Diagnosis not present

## 2023-08-25 DIAGNOSIS — L821 Other seborrheic keratosis: Secondary | ICD-10-CM | POA: Diagnosis not present

## 2023-08-25 DIAGNOSIS — L7211 Pilar cyst: Secondary | ICD-10-CM | POA: Diagnosis not present

## 2023-09-02 ENCOUNTER — Encounter: Payer: Self-pay | Admitting: Neurology

## 2023-09-02 ENCOUNTER — Ambulatory Visit: Payer: PPO | Admitting: Neurology

## 2023-09-02 VITALS — BP 115/80 | HR 61 | Ht 72.0 in | Wt 240.0 lb

## 2023-09-02 DIAGNOSIS — M5441 Lumbago with sciatica, right side: Secondary | ICD-10-CM

## 2023-09-02 DIAGNOSIS — R200 Anesthesia of skin: Secondary | ICD-10-CM | POA: Diagnosis not present

## 2023-09-02 DIAGNOSIS — R259 Unspecified abnormal involuntary movements: Secondary | ICD-10-CM | POA: Diagnosis not present

## 2023-09-02 DIAGNOSIS — M5417 Radiculopathy, lumbosacral region: Secondary | ICD-10-CM

## 2023-09-02 DIAGNOSIS — R7303 Prediabetes: Secondary | ICD-10-CM

## 2023-09-02 DIAGNOSIS — E538 Deficiency of other specified B group vitamins: Secondary | ICD-10-CM

## 2023-09-02 DIAGNOSIS — G8929 Other chronic pain: Secondary | ICD-10-CM | POA: Diagnosis not present

## 2023-09-02 NOTE — Patient Instructions (Addendum)
Your symptoms seem stable. Given that you do not currently have pain, you can try stopping the gabapentin, as this does not help numbness. If you have numbness and tingling, you can continue the gabapentin.  I see nothing concerning about your jaw and do not see abnormal movements today. I would monitor these symptoms.  Follow up with me as needed for new or worsening symptoms.  The physicians and staff at [facility] are committed to providing excellent care. You may receive a survey requesting feedback about your experience at our office. We strive to receive "very good" responses to the survey questions. If you feel that your experience would prevent you from giving the office a "very good " response, please contact our office to try to remedy the situation. We may be reached at [phone]. Thank you for taking the time out of your busy day to complete the survey.  Jacquelyne Balint, MD [facility]

## 2023-09-18 ENCOUNTER — Encounter: Payer: Self-pay | Admitting: Internal Medicine

## 2023-09-18 ENCOUNTER — Ambulatory Visit (INDEPENDENT_AMBULATORY_CARE_PROVIDER_SITE_OTHER): Payer: PPO | Admitting: Internal Medicine

## 2023-09-18 VITALS — BP 122/74 | HR 64 | Temp 98.1°F | Ht 72.0 in | Wt 238.0 lb

## 2023-09-18 DIAGNOSIS — E538 Deficiency of other specified B group vitamins: Secondary | ICD-10-CM

## 2023-09-18 DIAGNOSIS — N1831 Chronic kidney disease, stage 3a: Secondary | ICD-10-CM

## 2023-09-18 DIAGNOSIS — Z1211 Encounter for screening for malignant neoplasm of colon: Secondary | ICD-10-CM | POA: Diagnosis not present

## 2023-09-18 DIAGNOSIS — Z125 Encounter for screening for malignant neoplasm of prostate: Secondary | ICD-10-CM

## 2023-09-18 DIAGNOSIS — E7849 Other hyperlipidemia: Secondary | ICD-10-CM

## 2023-09-18 DIAGNOSIS — Z23 Encounter for immunization: Secondary | ICD-10-CM

## 2023-09-18 DIAGNOSIS — R7302 Impaired glucose tolerance (oral): Secondary | ICD-10-CM | POA: Diagnosis not present

## 2023-09-18 DIAGNOSIS — Z0001 Encounter for general adult medical examination with abnormal findings: Secondary | ICD-10-CM | POA: Diagnosis not present

## 2023-09-18 DIAGNOSIS — I10 Essential (primary) hypertension: Secondary | ICD-10-CM

## 2023-09-18 DIAGNOSIS — N183 Chronic kidney disease, stage 3 unspecified: Secondary | ICD-10-CM | POA: Insufficient documentation

## 2023-09-18 DIAGNOSIS — E559 Vitamin D deficiency, unspecified: Secondary | ICD-10-CM

## 2023-09-18 LAB — BASIC METABOLIC PANEL
BUN: 23 mg/dL (ref 6–23)
CO2: 27 meq/L (ref 19–32)
Calcium: 9.9 mg/dL (ref 8.4–10.5)
Chloride: 103 meq/L (ref 96–112)
Creatinine, Ser: 1.24 mg/dL (ref 0.40–1.50)
GFR: 58.84 mL/min — ABNORMAL LOW (ref >60.00)
Glucose, Bld: 95 mg/dL (ref 70–99)
Potassium: 4.9 meq/L (ref 3.5–5.1)
Sodium: 138 meq/L (ref 135–145)

## 2023-09-18 LAB — CBC WITH DIFFERENTIAL/PLATELET
Basophils Absolute: 0 10*3/uL (ref 0.0–0.1)
Basophils Relative: 0.6 % (ref 0.0–3.0)
Eosinophils Absolute: 0.2 10*3/uL (ref 0.0–0.7)
Eosinophils Relative: 2.9 % (ref 0.0–5.0)
HCT: 46.5 % (ref 39.0–52.0)
Hemoglobin: 14.8 g/dL (ref 13.0–17.0)
Lymphocytes Relative: 29.1 % (ref 12.0–46.0)
Lymphs Abs: 1.8 10*3/uL (ref 0.7–4.0)
MCHC: 31.9 g/dL (ref 30.0–36.0)
MCV: 94 fL (ref 78.0–100.0)
Monocytes Absolute: 0.8 10*3/uL (ref 0.1–1.0)
Monocytes Relative: 13.4 % — ABNORMAL HIGH (ref 3.0–12.0)
Neutro Abs: 3.3 10*3/uL (ref 1.4–7.7)
Neutrophils Relative %: 54 % (ref 43.0–77.0)
Platelets: 198 10*3/uL (ref 150.0–400.0)
RBC: 4.95 Mil/uL (ref 4.22–5.81)
RDW: 13.7 % (ref 11.5–15.5)
WBC: 6.1 10*3/uL (ref 4.0–10.5)

## 2023-09-18 LAB — LIPID PANEL
Cholesterol: 150 mg/dL (ref 0–200)
HDL: 55 mg/dL (ref >39.00)
LDL Cholesterol: 77 mg/dL (ref 0–99)
NonHDL: 95.23
Total CHOL/HDL Ratio: 3
Triglycerides: 92 mg/dL (ref 0.0–149.0)
VLDL: 18.4 mg/dL (ref 0.0–40.0)

## 2023-09-18 LAB — VITAMIN D 25 HYDROXY (VIT D DEFICIENCY, FRACTURES): VITD: 42.72 ng/mL (ref 30.00–100.00)

## 2023-09-18 LAB — VITAMIN B12: Vitamin B-12: 925 pg/mL — ABNORMAL HIGH (ref 211–911)

## 2023-09-18 LAB — URINALYSIS, ROUTINE W REFLEX MICROSCOPIC
Bilirubin Urine: NEGATIVE
Hgb urine dipstick: NEGATIVE
Ketones, ur: NEGATIVE
Leukocytes,Ua: NEGATIVE
Nitrite: NEGATIVE
RBC / HPF: NONE SEEN (ref 0–?)
Specific Gravity, Urine: 1.02 (ref 1.000–1.030)
Total Protein, Urine: NEGATIVE
Urine Glucose: NEGATIVE
Urobilinogen, UA: 0.2 (ref 0.0–1.0)
pH: 5.5 (ref 5.0–8.0)

## 2023-09-18 LAB — MICROALBUMIN / CREATININE URINE RATIO
Creatinine,U: 122.8 mg/dL
Microalb Creat Ratio: 0.8 mg/g (ref 0.0–30.0)
Microalb, Ur: 1 mg/dL (ref 0.0–1.9)

## 2023-09-18 LAB — HEMOGLOBIN A1C: Hgb A1c MFr Bld: 6 % (ref 4.6–6.5)

## 2023-09-18 LAB — HEPATIC FUNCTION PANEL
ALT: 21 U/L (ref 0–53)
AST: 20 U/L (ref 0–37)
Albumin: 4.3 g/dL (ref 3.5–5.2)
Alkaline Phosphatase: 103 U/L (ref 39–117)
Bilirubin, Direct: 0.1 mg/dL (ref 0.0–0.3)
Total Bilirubin: 0.7 mg/dL (ref 0.2–1.2)
Total Protein: 6.8 g/dL (ref 6.0–8.3)

## 2023-09-18 LAB — TSH: TSH: 1.35 u[IU]/mL (ref 0.35–5.50)

## 2023-09-18 LAB — PSA: PSA: 1.13 ng/mL (ref 0.10–4.00)

## 2023-09-18 NOTE — Progress Notes (Signed)
The test results show that your current treatment is OK, as the tests are stable.  Please continue the same plan.  There is no other need for change of treatment or further evaluation based on these results, at this time.  thanks 

## 2023-09-18 NOTE — Patient Instructions (Signed)
Please continue all other medications as before, and refills have been done if requested.  Please have the pharmacy call with any other refills you may need.  Please continue your efforts at being more active, low cholesterol diet, and weight control.  You are otherwise up to date with prevention measures today.  Please keep your appointments with your specialists as you may have planned  You will be contacted regarding the referral for: [email or web address]  Please go to the LAB at the blood drawing area for the tests to be done  You will be contacted by phone if any changes need to be made immediately.  Otherwise, you will receive a letter about your results with an explanation, but please check with MyChart first.  Please make an Appointment to return for your 1 year visit, or sooner if needed

## 2023-09-18 NOTE — Progress Notes (Unsigned)
Patient ID: [name], male   DOB: January 04, 1951, 72 y.o.   MRN: [id number]         Chief Complaint:: wellness exam and hyperglycemia, hld, htn, ckd3a       HPI:  [name] is a 72 y.o. male here for wellness exam; for cologuard, for shignrx at pharmaycy, o/w up to date                Also Pt denies chest pain, increased sob or doe, wheezing, orthopnea, PND, increased LE swelling, palpitations, dizziness or syncope.  Pt denies polydipsia, polyuria, or new focal neuro s/s.    Pt denies fever, wt loss, night sweats, loss of appetite, or other constitutional symptoms     Wt Readings from Last 3 Encounters:  09/18/23 238 lb (108 kg)  09/02/23 240 lb (108.9 kg)  03/05/23 239 lb 11.2 oz (108.7 kg)   BP Readings from Last 3 Encounters:  09/18/23 122/74  09/02/23 115/80  03/05/23 104/70   Immunization History  Administered Date(s) Administered   Fluad Quad(high Dose 65+) 12/16/2022   Fluad Trivalent(High Dose 65+) 09/18/2023   Influenza, High Dose Seasonal PF 08/10/2018   Influenza,inj,Quad PF,6+ Mos 01/17/2015   Influenza-Unspecified 12/01/2017   PFIZER(Purple Top)SARS-COV-2 Vaccination 03/01/2020, 03/26/2020   Pneumococcal Conjugate-13 06/15/2020   Pneumococcal Polysaccharide-23 07/28/2018   Tdap 01/17/2015   Health Maintenance Due  Topic Date Due   Medicare Annual Wellness (AWV)  Never done   Zoster Vaccines- Shingrix (1 of 2) Never done   Fecal DNA (Cologuard)  07/22/2023      Past Medical History:  Diagnosis Date   ALLERGIC RHINITIS 12/31/2007   Aortic atherosclerosis (HCC) 08/26/2022   Arthritis    Chronic combined systolic and diastolic heart failure (HCC) 06/14/2021   COUGH, CHRONIC 07/18/2007   DEGENERATIVE JOINT DISEASE 07/18/2007   Extrinsic asthma, unspecified 09/08/2009   GERD 07/18/2007   HYPERLIPIDEMIA 08/14/2010   Hypertension    Impaired glucose tolerance 09/23/2011   OBESITY 07/18/2007   Pneumonia    Past Surgical History:  Procedure Laterality  Date   CARDIOVERSION N/A 12/28/2020   Procedure: CARDIOVERSION;  Surgeon: Chilton Si, MD;  Location: [facility] ENDOSCOPY;  Service: Cardiovascular;  Laterality: N/A;   CARDIOVERSION N/A 09/04/2022   Procedure: CARDIOVERSION;  Surgeon: Jake Bathe, MD;  Location: [facility] ENDOSCOPY;  Service: Cardiovascular;  Laterality: N/A;   CARDIOVERSION N/A 02/05/2023   Procedure: CARDIOVERSION;  Surgeon: Jodelle Red, MD;  Location: [facility] ENDOSCOPY;  Service: Cardiovascular;  Laterality: N/A;   DG FEMUR LEFT  ([facility] HX)     rod placed   LUMBAR DISC SURGERY     TEE WITHOUT CARDIOVERSION N/A 12/28/2020   Procedure: TRANSESOPHAGEAL ECHOCARDIOGRAM (TEE);  Surgeon: Chilton Si, MD;  Location: [facility] ENDOSCOPY;  Service: Cardiovascular;  Laterality: N/A;   TEE WITHOUT CARDIOVERSION N/A 09/04/2022   Procedure: TRANSESOPHAGEAL ECHOCARDIOGRAM (TEE);  Surgeon: Jake Bathe, MD;  Location: [facility] ENDOSCOPY;  Service: Cardiovascular;  Laterality: N/A;   TOOTH EXTRACTION     TOTAL KNEE ARTHROPLASTY Right 07/26/2018   Procedure: RIGHT TOTAL KNEE ARTHROPLASTY;  Surgeon: Kerrin Champagne, MD;  Location: [facility] OR;  Service: Orthopedics;  Laterality: Right;    reports that he has never smoked. He has never used smokeless tobacco. He reports current alcohol use of about 2.0 - 4.0 standard drinks of alcohol per week. He reports that he does not currently use drugs after having used the following drugs: Marijuana. family history includes Cerebral palsy in his sister; Diabetes  in his maternal grandmother; Lupus in his father. No Known Allergies Current Outpatient Medications on File Prior to Visit  Medication Sig Dispense Refill   acetaminophen (TYLENOL) 650 MG CR tablet Take 650 mg by mouth daily.     albuterol (PROAIR HFA) 108 (90 Base) MCG/ACT inhaler Inhale 2 puffs into the lungs every 6 (six) hours as needed for wheezing or shortness of breath. 24 g 3   apixaban (ELIQUIS) 5 MG TABS tablet Take 1 tablet (5 mg total) by mouth 2  (two) times daily. 180 tablet 1   budesonide-formoterol (SYMBICORT) 80-4.5 MCG/ACT inhaler Inhale 2 puffs into the lungs 2 (two) times daily. (Patient taking differently: Inhale 2 puffs into the lungs 2 (two) times daily as needed (Wheezing / SOB).) 3 each 3   cyanocobalamin (VITAMIN B12) 1000 MCG tablet Take 1,000 mcg by mouth daily.     diclofenac Sodium (VOLTAREN) 1 % GEL Apply 1 Application topically 4 (four) times daily as needed (pain).     esomeprazole (NEXIUM) 40 MG capsule Take 1 capsule (40 mg total) by mouth daily at 12 noon. Take 1 Daily (Patient taking differently: Take 40 mg by mouth daily as needed (acid reflux).) 90 capsule 3   Melatonin 10 MG TABS Take 10 mg by mouth at bedtime as needed (sleep).     metoprolol succinate (TOPROL-XL) 25 MG 24 hr tablet Take 1.5 tablets (37.5 mg total) by mouth daily. (Patient taking differently: Take 37.5 mg by mouth at bedtime.) 135 tablet 3   Multiple Vitamins-Minerals (CENTRUM ADULTS PO) Take 1 tablet by mouth 3 (three) times a week.     oxymetazoline (AFRIN) 0.05 % nasal spray Place 1 spray into both nostrils 2 (two) times daily as needed for congestion.     rosuvastatin (CRESTOR) 20 MG tablet TAKE 1 TABLET BY MOUTH EVERY DAY IN THE EVENING 90 tablet 2   triamcinolone cream (KENALOG) 0.1 % Apply topically 2 (two) times daily as needed.     gabapentin (NEURONTIN) 300 MG capsule Take 1 capsule (300 mg total) by mouth at bedtime. 1 capsule at bedtime (Patient not taking: Reported on 09/18/2023) 180 capsule 3   No current facility-administered medications on file prior to visit.        ROS:  All others reviewed and negative.  Objective        PE:  BP 122/74 (BP Location: Left Arm, Patient Position: Sitting, Cuff Size: Normal)   Pulse 64   Temp 98.1 F (36.7 C) (Oral)   Ht 6' (1.829 m)   Wt 238 lb (108 kg)   SpO2 99%   BMI 32.28 kg/m                 Constitutional: Pt appears in NAD               HENT: Head: NCAT.                Right  Ear: External ear normal.                 Left Ear: External ear normal.                Eyes: . Pupils are equal, round, and reactive to light. Conjunctivae and EOM are normal               Nose: without d/c or deformity               Neck: Neck supple. Gross normal ROM  Cardiovascular: Normal rate and regular rhythm.                 Pulmonary/Chest: Effort normal and breath sounds without rales or wheezing.                Abd:  Soft, NT, ND, + BS, no organomegaly               Neurological: Pt is alert. At baseline orientation, motor grossly intact               Skin: Skin is warm. No rashes, no other new lesions, LE edema - none               Psychiatric: Pt behavior is normal without agitation   Micro: none  Cardiac tracings I have personally interpreted today:  none  Pertinent Radiological findings (summarize): none   Lab Results  Component Value Date   WBC 6.1 09/18/2023   HGB 14.8 09/18/2023   HCT 46.5 09/18/2023   PLT 198.0 09/18/2023   GLUCOSE 95 09/18/2023   CHOL 150 09/18/2023   TRIG 92.0 09/18/2023   HDL 55.00 09/18/2023   LDLDIRECT 132.9 09/23/2011   LDLCALC 77 09/18/2023   ALT 21 09/18/2023   AST 20 09/18/2023   NA 138 09/18/2023   K 4.9 09/18/2023   CL 103 09/18/2023   CREATININE 1.24 09/18/2023   BUN 23 09/18/2023   CO2 27 09/18/2023   TSH 1.35 09/18/2023   PSA 1.13 09/18/2023   INR 0.96 07/16/2018   HGBA1C 6.0 09/18/2023   MICROALBUR 1.0 09/18/2023   Assessment/Plan:  DARRIOUS MACRI is a 72 y.o. White or Caucasian [1] male with  has a past medical history of ALLERGIC RHINITIS (12/31/2007), Aortic atherosclerosis (HCC) (08/26/2022), Arthritis, Chronic combined systolic and diastolic heart failure (HCC) (47/42/5956), COUGH, CHRONIC (07/18/2007), DEGENERATIVE JOINT DISEASE (07/18/2007), Extrinsic asthma, unspecified (09/08/2009), GERD (07/18/2007), HYPERLIPIDEMIA (08/14/2010), Hypertension, Impaired glucose tolerance (09/23/2011), OBESITY  (07/18/2007), and Pneumonia.  Encounter for well adult exam with abnormal findings Age and sex appropriate education and counseling updated with regular exercise and diet Referrals for preventative services - for cologuard Immunizations addressed - for shingrix at pharmacy Smoking counseling  - none needed Evidence for depression or other mood disorder - none significant Most recent labs reviewed. I have personally reviewed and have noted: 1) the patient's medical and social history 2) The patient's current medications and supplements 3) The patient's height, weight, and BMI have been recorded in the chart   Impaired glucose tolerance Lab Results  Component Value Date   HGBA1C 6.0 09/18/2023   Stable, pt to continue current medical treatment  - diet,wt control   Hyperlipidemia Lab Results  Component Value Date   LDLCALC 77 09/18/2023   Uncontrolled, pt to continue current statin crestor 20 every day with good compliance and lower chol  diet, declines other change today  HTN (hypertension) BP Readings from Last 3 Encounters:  09/18/23 122/74  09/02/23 115/80  03/05/23 104/70   Stable, pt to continue medical treatment toprol xl 37.5 qd   CKD (chronic kidney disease) stage 3, GFR 30-59 ml/min (HCC) Lab Results  Component Value Date   CREATININE 1.24 09/18/2023   Stable overall, cont to avoid nephrotoxins  Followup: Return in about 1 year (around 09/17/2024).  Oliver Barre, MD 09/22/2023 7:15 AM [facility]

## 2023-09-22 ENCOUNTER — Encounter: Payer: Self-pay | Admitting: Internal Medicine

## 2023-09-22 NOTE — Assessment & Plan Note (Addendum)
Lab Results  Component Value Date   LDLCALC 77 09/18/2023   Uncontrolled, pt to continue current statin crestor 20 every day with good compliance and lower chol  diet, declines other change today

## 2023-09-22 NOTE — Assessment & Plan Note (Signed)
Lab Results  Component Value Date   HGBA1C 6.0 09/18/2023   Stable, pt to continue current medical treatment  - diet,wt control

## 2023-09-22 NOTE — Assessment & Plan Note (Signed)
Lab Results  Component Value Date   CREATININE 1.24 09/18/2023   Stable overall, cont to avoid nephrotoxins

## 2023-09-22 NOTE — Assessment & Plan Note (Signed)
BP Readings from Last 3 Encounters:  09/18/23 122/74  09/02/23 115/80  03/05/23 104/70   Stable, pt to continue medical treatment toprol xl 37.5 qd

## 2023-09-22 NOTE — Assessment & Plan Note (Signed)
Age and sex appropriate education and counseling updated with regular exercise and diet Referrals for preventative services - for cologuard Immunizations addressed - for shingrix at pharmacy Smoking counseling  - none needed Evidence for depression or other mood disorder - none significant Most recent labs reviewed. I have personally reviewed and have noted: 1) the patient's medical and social history 2) The patient's current medications and supplements 3) The patient's height, weight, and BMI have been recorded in the chart

## 2023-09-23 ENCOUNTER — Ambulatory Visit: Payer: PPO | Attending: Cardiology | Admitting: Cardiology

## 2023-09-23 ENCOUNTER — Encounter: Payer: Self-pay | Admitting: Cardiology

## 2023-09-23 VITALS — BP 120/84 | HR 66 | Ht 72.0 in | Wt 238.8 lb

## 2023-09-23 DIAGNOSIS — I483 Typical atrial flutter: Secondary | ICD-10-CM | POA: Diagnosis not present

## 2023-09-23 DIAGNOSIS — D6869 Other thrombophilia: Secondary | ICD-10-CM | POA: Diagnosis not present

## 2023-09-23 NOTE — Progress Notes (Signed)
Electrophysiology Office Note:   Date:  09/23/2023  ID:  [name], DOB [birth year], MRN [id number]  Primary Cardiologist: Chilton Si, MD Electrophysiologist: Regan Lemming, MD      History of Present Illness:   [name] is a 72 y.o. male with h/o atrial flutter, aortic atherosclerosis with ascending aneurysm, chronic systolic and diastolic heart failure with recovered ejection fraction, hyperlipidemia, hypertension, obesity, GERD seen today for routine electrophysiology followup.  He is post cardioversion 02/05/2023.  Since last being seen in our clinic the patient reports doing well.  He has no chest pain or shortness of breath.  He is able to do all of his daily activities.  He checks his rhythm via cardia mobile and has not noted any further episodes of atrial flutter.  He did not feel much different after his cardioversion.  At this point he is not ready for ablation.  he denies chest pain, palpitations, dyspnea, PND, orthopnea, nausea, vomiting, dizziness, syncope, edema, weight gain, or early satiety.   Review of systems complete and found to be negative unless listed in HPI.   EP Information / Studies Reviewed:    EKG is ordered today. Personal review as below.  EKG Interpretation Date/Time:  Wednesday September 23 2023 08:10:36 EDT Ventricular Rate:  66 PR Interval:  232 QRS Duration:  90 QT Interval:  390 QTC Calculation: 408 R Axis:   -86  Text Interpretation: Sinus rhythm with 1st degree A-V block Left axis deviation When compared with ECG of 05-Feb-2023 10:55, No significant change since last tracing Confirmed by Analyce Tavares ([id number]) on 09/23/2023 8:15:31 AM     Risk Assessment/Calculations:    CHA2DS2-VASc Score = 4   This indicates a 4.8% annual risk of stroke. The patient's score is based upon: CHF History: 1 HTN History: 1 Diabetes History: 0 Stroke History: 0 Vascular Disease History: 1 Age Score: 1 Gender Score: 0        STOP-Bang  Score:          Physical Exam:   VS:  BP 120/84 (BP Location: Left Arm, Patient Position: Sitting, Cuff Size: Large)   Pulse 66   Ht 6' (1.829 m)   Wt 238 lb 12.8 oz (108.3 kg)   SpO2 96%   BMI 32.39 kg/m    Wt Readings from Last 3 Encounters:  09/23/23 238 lb 12.8 oz (108.3 kg)  09/18/23 238 lb (108 kg)  09/02/23 240 lb (108.9 kg)     GEN: Well nourished, well developed in no acute distress NECK: No JVD; No carotid bruits CARDIAC: Regular rate and rhythm, no murmurs, rubs, gallops RESPIRATORY:  Clear to auscultation without rales, wheezing or rhonchi  ABDOMEN: Soft, non-tender, non-distended EXTREMITIES:  No edema; No deformity   ASSESSMENT AND PLAN:    1.  Typical and atypical atrial flutter: Currently feeling well.  He is fortunately not had any recurrences.  He would prefer to hold off on procedures for atrial flutter at this time.  If he does have recurrence, he would be potentially amenable to ablation.  2.  Prediabetes: Plan per primary physician  3.  Hyperlipidemia: Continue Crestor per primary cardiology  4.  Secondary hypercoagulable state: Currently on Eliquis for atrial fibrillation  Follow up with EP APP in 12 months  Signed, Irie Dowson Jorja Loa, MD

## 2023-09-23 NOTE — Patient Instructions (Addendum)
 Medication Instructions:  Your physician recommends that you continue on your current medications as directed. Please refer to the Current Medication list given to you today.  *If you need a refill on your cardiac medications before your next appointment, please call your pharmacy*   Lab Work: None ordered   Testing/Procedures: None ordered   Follow-Up: At [facility], you and your health needs are our priority.  As part of our continuing mission to provide you with exceptional heart care, we have created designated Provider Care Teams.  These Care Teams include your primary Cardiologist (physician) and Advanced Practice Providers (APPs -  Physician Assistants and Nurse Practitioners) who all work together to provide you with the care you need, when you need it.  Your next appointment:   1 year(s)  The format for your next appointment:   In Person  Provider:   You will see one of the following Advanced Practice Providers on your designated Care Team:   Francis Dowse, [place] "Mardelle Matte" [place], [place] Canary Brim, NP   Thank you for choosing [facility]!!   Dory Horn, RN [phone]

## 2023-09-25 ENCOUNTER — Ambulatory Visit (HOSPITAL_BASED_OUTPATIENT_CLINIC_OR_DEPARTMENT_OTHER): Payer: PPO | Admitting: Cardiovascular Disease

## 2023-09-25 ENCOUNTER — Encounter (HOSPITAL_BASED_OUTPATIENT_CLINIC_OR_DEPARTMENT_OTHER): Payer: Self-pay | Admitting: Cardiovascular Disease

## 2023-09-25 VITALS — BP 115/64 | HR 60 | Ht 73.0 in | Wt 240.0 lb

## 2023-09-25 DIAGNOSIS — I700 Atherosclerosis of aorta: Secondary | ICD-10-CM | POA: Diagnosis not present

## 2023-09-25 DIAGNOSIS — I5042 Chronic combined systolic (congestive) and diastolic (congestive) heart failure: Secondary | ICD-10-CM | POA: Diagnosis not present

## 2023-09-25 DIAGNOSIS — I4892 Unspecified atrial flutter: Secondary | ICD-10-CM

## 2023-09-25 DIAGNOSIS — I10 Essential (primary) hypertension: Secondary | ICD-10-CM | POA: Diagnosis not present

## 2023-09-25 DIAGNOSIS — E7849 Other hyperlipidemia: Secondary | ICD-10-CM | POA: Diagnosis not present

## 2023-09-25 NOTE — Patient Instructions (Signed)
Medication Instructions:  Your physician recommends that you continue on your current medications as directed. Please refer to the Current Medication list given to you today.  *If you need a refill on your cardiac medications before your next appointment, please call your pharmacy*   Lab Work: Your physician recommends that you return for lab work in April for Fasting Lipid Panel & CMP   Follow-Up: At [facility], you and your health needs are our priority.  As part of our continuing mission to provide you with exceptional heart care, we have created designated Provider Care Teams.  These Care Teams include your primary Cardiologist (physician) and Advanced Practice Providers (APPs -  Physician Assistants and Nurse Practitioners) who all work together to provide you with the care you need, when you need it.  We recommend signing up for the patient portal called "MyChart".  Sign up information is provided on this After Visit Summary.  MyChart is used to connect with patients for Virtual Visits (Telemedicine).  Patients are able to view lab/test results, encounter notes, upcoming appointments, etc.  Non-urgent messages can be sent to your provider as well.   To learn more about what you can do with MyChart, go to [email or web address].    Your next appointment:   1 year with Dr. Duke Salvia   Other Instructions Exercise recommendations: The American Heart Association recommends 150 minutes of moderate intensity exercise weekly. Try 30 minutes of moderate intensity exercise 4-5 times per week. This could include walking, jogging, or swimming.

## 2023-09-25 NOTE — Progress Notes (Signed)
Cardiology Office Note:  .   Date:  09/25/2023  ID:  JOSES GALLE, DOB 1951-05-15, MRN [id number] PCP: Corwin Levins, MD  [facility] Providers Cardiologist:  Chilton Si, MD Electrophysiologist:  Will Jorja Loa, MD    History of Present Illness: .   [name] is a 72 y.o. male  with a hx of aortic atherosclerosis, ascending aortic aneurysm, atrial flutter, chronic systolic diastolic heart failure (recovered LVEF), hyperlipidemia, hypertension, obesity, and GERD, here for follow-up. He was first seen in the Atrial fibrillation Clinic 12/2020 for atrial flutter. He was started on Eliquis and metoprolol. He had a TEE/DCCV 12/2020. The echo revealed LVEF less then 20% with global hypokinesis and a secundum ASD. He followed up with Bailey Mech, DNP and was referred for repeat Echo, where his LVEF had improved to 50%.   He reported exertional dyspnea, despite being in sinus rhythm. He had a coronary CTA 05/2021 that showed minimal non-calcified plaque. His calcium score was 0. He had aortic atherosclerosis, and his aorta was 3.9 cm. He had an Echo  01/2022 which revealed LVEF 55-60%, mild LVH, and grade 1 diastolic dysfunction. The aortic root was 4.6 cm, and the ascending aorta was 4.4 cm. He saw [name], NP 01/2022 and was feeling well.    At his visit 08/2022 he was in atrial flutter and was feeling tired. His rate was controlled on metoprolol. He was started on Eliquis and referred for TEE/DCCV 08/2022. LVEF was 60-65%; ascending aorta was 4.5 cm and he was found to have a small PFO. He was successfully cardioverted. He had a sleep study that was negative for OSA. He's had recurrent issues with PAF and was seen in Afib clinic 01/2023 and was referred to EP to discuss ablation.   On 01/2023 he complained of frequent fatigue and SOB after activity. After his previous cardioversion his symptoms hadn't improved. He complained of bilateral numbness in his feet. He followed  up with Dr. Elberta Fortis on 01/2023 for his atrial flutter where he agreed to ablation. He underwent successful cardioversion on 02/05/2023. He wore a monitor after cardioversion where he maintained sinus rhythm and had 76 episodes of SVT.  He continued to complain of fatigue 03/2023.  He had a sleep study that was negative for OSA.  It was recommended that he work on increasing his exercise.  He saw Dr. Elberta Fortis 09/2023 and was doing well.  He was not interested in ablation at the time.  [name] has been monitoring his heart rhythm at home, although he initially struggled with the [name] device, he has now mastered its use. He describes his general health as "okay" but admits to not getting enough exercise. His primary physical activity is yard work, which he does regularly, although this has slowed down recently. He reports no chest pain or pressure during strenuous activities or swelling in his legs.  He has been taking B12 supplements due to a previous deficiency, but after noticing high levels in his recent blood work, he has reduced his intake to every other day. He also reports numbness in his feet and occasional severe pain from his buttocks down, which he manages with Neurontin. However, he has been able to reduce his Neurontin intake recently due to a decrease in pain.  Regarding his diet, he reports following a relatively healthy diet with no specific focus on avoiding certain foods. He has recently joined a local gym but has only attended once due to other commitments.  His wife has purchased a walking pad, which she uses regularly, but he has yet to start using it. His cholesterol levels are slightly above the desired range, but he is taking rosuvastatin for this. His blood pressure is well controlled, and he is on Eliquis and metoprolol for atrial flutter.     ROS:  As per HPI  Studies Reviewed: .       Echo 01/2022: 1. Left ventricular ejection fraction, by estimation, is 55 to 60%. The  left  ventricle has normal function. The left ventricle has no regional  wall motion abnormalities. There is mild concentric left ventricular  hypertrophy of the septal segment. Left  ventricular diastolic parameters are consistent with Grade I diastolic  dysfunction (impaired relaxation). The average left ventricular global  longitudinal strain is 19.8 %. The global longitudinal strain is normal.   2. Right ventricular systolic function is normal. The right ventricular  size is normal. There is normal pulmonary artery systolic pressure.   3. Left atrial size was mildly dilated.   4. Right atrial size was mildly dilated.   5. The mitral valve is normal in structure. No evidence of mitral valve  regurgitation. No evidence of mitral stenosis.   6. The aortic valve is tricuspid. Aortic valve regurgitation is trivial.  No aortic stenosis is present.   7. Aortic dilatation noted. There is moderate dilatation of the aortic  root, measuring 46 mm. There is moderate dilatation of the ascending  aorta, measuring 44 mm.   8. The inferior vena cava is normal in size with greater than 50%  respiratory variability, suggesting right atrial pressure of 3 mmHg.   Risk Assessment/Calculations:    CHA2DS2-VASc Score = 4   This indicates a 4.8% annual risk of stroke. The patient's score is based upon: CHF History: 1 HTN History: 1 Diabetes History: 0 Stroke History: 0 Vascular Disease History: 1 Age Score: 1 Gender Score: 0        STOP-Bang Score:         Physical Exam:   VS:  BP 115/64   Pulse 60   Ht 6\' 1"  (1.854 m)   Wt 240 lb (108.9 kg)   BMI 31.66 kg/m  , BMI Body mass index is 31.66 kg/m. GENERAL:  Well appearing HEENT: Pupils equal round and reactive, fundi not visualized, oral mucosa unremarkable NECK:  No jugular venous distention, waveform within normal limits, carotid upstroke brisk and symmetric, no bruits, no thyromegaly LUNGS:  Clear to auscultation bilaterally HEART:  RRR.  PMI  not displaced or sustained,S1 and S2 within normal limits, no S3, no S4, no clicks, no rubs, no murmurs ABD:  Flat, positive bowel sounds normal in frequency in pitch, no bruits, no rebound, no guarding, no midline pulsatile mass, no hepatomegaly, no splenomegaly EXT:  2 plus pulses throughout, no edema, no cyanosis no clubbing SKIN:  No rashes no nodules NEURO:  Cranial nerves II through XII grossly intact, motor grossly intact throughout PSYCH:  Cognitively intact, oriented to person place and time   ASSESSMENT AND PLAN: .    # Atrial Flutter Stable with no reported issues with heart rhythm. Currently on Eliquis and Metoprolol as prophylaxis. -Continue current medication regimen.   # Aortic atherosclerosis: # Hyperlipidemia LDL at 77, slightly above the target of under 70. Currently on Rosuvastatin. Discussed the potential of increasing physical activity to further lower LDL. -Encourage increased physical activity, such as using the newly acquired walking pad. -Recheck cholesterol in six months (April  2025).  # Peripheral Neuropathy Numbness in feet, previously managed with Neurontin. Currently off Neurontin for about a week with no reported increase in symptoms. -Continue current management plan. Restart Neurontin if symptoms return.  # Vitamin B12 Supplementation High B12 levels likely due to supplementation. Previously advised to take 1000mg  daily due to low levels. -Reduce B12 supplementation to three or four days a week.  Follow-up Plan for annual follow-up visit. Earlier if any issues arise.      Dispo: f/u in 1 year  Signed, Chilton Si, MD

## 2023-09-28 DIAGNOSIS — Z1211 Encounter for screening for malignant neoplasm of colon: Secondary | ICD-10-CM | POA: Diagnosis not present

## 2023-10-06 ENCOUNTER — Other Ambulatory Visit: Payer: Self-pay | Admitting: Internal Medicine

## 2023-10-06 DIAGNOSIS — R195 Other fecal abnormalities: Secondary | ICD-10-CM

## 2023-10-06 LAB — COLOGUARD: COLOGUARD: POSITIVE — AB

## 2023-10-08 ENCOUNTER — Telehealth: Payer: Self-pay | Admitting: *Deleted

## 2023-10-08 ENCOUNTER — Telehealth: Payer: Self-pay | Admitting: Cardiovascular Disease

## 2023-10-08 ENCOUNTER — Telehealth: Payer: Self-pay | Admitting: Gastroenterology

## 2023-10-08 NOTE — Telephone Encounter (Signed)
Pt wants to know if he needs to hold Eliquis prior to colonoscopy.

## 2023-10-08 NOTE — Telephone Encounter (Signed)
For preop clearances his gastroenterologist would need to send a request to hold Eliquis to our office. They can fax to [phone]. His GI office should be familiar with this process. Once that request has been obtained then the pharmacy team will address it. The pharmacy team just needs additional info prior to providing clearance.   I would just call him and let him know his GI provider should send our office a request.   Alver Sorrow, NP

## 2023-10-08 NOTE — Telephone Encounter (Signed)
Inbound call from patient stating he spoke with his cardiologist provider Dr. Duke Salvia regarding clearance for blood thinner for colonoscopy. Patient has an office visit for 11/11 to discuss colonoscopy. States he would like to have the request for clearance sent so he is able to have colonoscopy soon after office visit. Please advise, thank you.

## 2023-10-08 NOTE — Telephone Encounter (Signed)
Request for surgical clearance:     Endoscopy Procedure  What type of surgery is being performed?     Colonoscopy  When is this surgery scheduled?     TBD  What type of clearance is required ?   Pharmacy  Are there any medications that need to be held prior to surgery and how long? Eliquis, 2 days  Practice name and name of physician performing surgery?      [facility]  What is your office phone and fax number?      Phone- [phone]  Fax- [phone]  Anesthesia type (None, local, MAC, general) ?       MAC

## 2023-10-08 NOTE — Telephone Encounter (Signed)
Pt wants to know if he should stop taking Eliquis before his colonoscopy. Please advise

## 2023-10-08 NOTE — Telephone Encounter (Signed)
Left message advising patient that eliquis clearance has been requested and we will discuss at his upcoming visit with Dr Sabra Heck on 10/12/23.

## 2023-10-08 NOTE — Telephone Encounter (Signed)
Called patient and informed him that GI office will need to fax request to hold Eliquis. Pt stated he spoke to GI today and the office is aware of sending the request here.

## 2023-10-08 NOTE — Telephone Encounter (Signed)
Pharmacy please advise on holding Eliquis prior to colonoscopy scheduled for TBD. Thank you.   

## 2023-10-11 NOTE — Telephone Encounter (Signed)
Patient with diagnosis of atrial fibrillation on Eliquis for anticoagulation.    Procedure: colonoscopy Date of procedure: TBD   CHA2DS2-VASc Score = 4   This indicates a 4.8% annual risk of stroke. The patient's score is based upon: CHF History: 1 HTN History: 1 Diabetes History: 0 Stroke History: 0 Vascular Disease History: 1 Age Score: 1 Gender Score: 0  Per chart patient has history of provoked DVT pater TKA in 2019  CrCl 85 Platelet count 198  Per office protocol, patient can hold Eliquis for 2 days prior to procedure.   Patient will not need bridging with Lovenox (enoxaparin) around procedure.  **This guidance is not considered finalized until pre-operative APP has relayed final recommendations.**

## 2023-10-12 ENCOUNTER — Ambulatory Visit: Payer: PPO | Admitting: Gastroenterology

## 2023-10-12 ENCOUNTER — Encounter: Payer: Self-pay | Admitting: Gastroenterology

## 2023-10-12 VITALS — BP 110/70 | HR 51 | Ht 73.0 in | Wt 235.0 lb

## 2023-10-12 DIAGNOSIS — I5042 Chronic combined systolic (congestive) and diastolic (congestive) heart failure: Secondary | ICD-10-CM

## 2023-10-12 DIAGNOSIS — R195 Other fecal abnormalities: Secondary | ICD-10-CM

## 2023-10-12 DIAGNOSIS — I4892 Unspecified atrial flutter: Secondary | ICD-10-CM | POA: Diagnosis not present

## 2023-10-12 DIAGNOSIS — Z7901 Long term (current) use of anticoagulants: Secondary | ICD-10-CM

## 2023-10-12 DIAGNOSIS — Z79899 Other long term (current) drug therapy: Secondary | ICD-10-CM

## 2023-10-12 MED ORDER — NA SULFATE-K SULFATE-MG SULF 17.5-3.13-1.6 GM/177ML PO SOLN: 1.0000 | ORAL | 0 refills | Status: DC

## 2023-10-12 NOTE — Progress Notes (Signed)
Chief Complaint:    Cologuard +, perioperative medication management  HPI:     Patient is a 72 y.o. male with a history of HTN, HLD, IFG, atrial flutter s/p cardioversion 12/2020 then TEE/DCCV 08/2022 (on Eliquis), CHF w/ pEF, GERD, presenting to the Gastroenterology Clinic to discuss scheduling colonoscopy for CRC screening and evaluation of + Cologuard.  He was last seen by me on 07/23/2022 for RLQ pain.  Pain had actually resolved by time of that appointment and has not returned.  Recent Cologuard positive last month.  Otherwise without overt bleeding.  In fact, he otherwise feels well and no GI symptoms or active issues/concerns.  He is concerned about the Cologuard result and would like to schedule his colonoscopy ASAP.   Otherwise normal CBC, liver enzymes, TSH, BMP, vitamin D, B12.  No recent abdominal imaging for review.  Was recently seen by his Cardiologist and provided clearance to hold Eliquis x 2 days prior to colonoscopy without need for Lovenox bridge along with cardiology clearance to proceed without need for further cardiovascular testing.   Endoscopic History: - Colonoscopy (01/2005): Normal - Cologuard negative 07/2020 - 09/2023: Cologuard positive   Review of systems:     No chest pain, no SOB, no fevers, no urinary sx   Past Medical History:  Diagnosis Date   ALLERGIC RHINITIS 12/31/2007   Aortic atherosclerosis (HCC) 08/26/2022   Arthritis    Chronic combined systolic and diastolic heart failure (HCC) 06/14/2021   COUGH, CHRONIC 07/18/2007   DEGENERATIVE JOINT DISEASE 07/18/2007   Extrinsic asthma, unspecified 09/08/2009   GERD 07/18/2007   HYPERLIPIDEMIA 08/14/2010   Hypertension    Impaired glucose tolerance 09/23/2011   OBESITY 07/18/2007   Pneumonia     Patient's surgical history, family medical history, social history, medications and allergies were all reviewed in Epic    Current Outpatient Medications  Medication Sig Dispense Refill    acetaminophen (TYLENOL) 650 MG CR tablet Take 650 mg by mouth daily.     apixaban (ELIQUIS) 5 MG TABS tablet Take 1 tablet (5 mg total) by mouth 2 (two) times daily. 180 tablet 1   budesonide-formoterol (SYMBICORT) 80-4.5 MCG/ACT inhaler Inhale 2 puffs into the lungs 2 (two) times daily. (Patient taking differently: Inhale 2 puffs into the lungs 2 (two) times daily as needed (Wheezing / SOB).) 3 each 3   cyanocobalamin (VITAMIN B12) 1000 MCG tablet Take 1,000 mcg by mouth every other day.     diclofenac Sodium (VOLTAREN) 1 % GEL Apply 1 Application topically 4 (four) times daily as needed (pain).     Melatonin 10 MG TABS Take 5 mg by mouth at bedtime as needed (sleep).     metoprolol succinate (TOPROL-XL) 25 MG 24 hr tablet Take 1.5 tablets (37.5 mg total) by mouth daily. (Patient taking differently: Take 37.5 mg by mouth at bedtime.) 135 tablet 3   Multiple Vitamins-Minerals (CENTRUM ADULTS PO) Take 1 tablet by mouth 3 (three) times a week.     oxymetazoline (AFRIN) 0.05 % nasal spray Place 1 spray into both nostrils 2 (two) times daily as needed for congestion.     rosuvastatin (CRESTOR) 20 MG tablet TAKE 1 TABLET BY MOUTH EVERY DAY IN THE EVENING 90 tablet 2   albuterol (PROAIR HFA) 108 (90 Base) MCG/ACT inhaler Inhale 2 puffs into the lungs every 6 (six) hours as needed for wheezing or shortness of breath. (Patient not taking: Reported on 10/12/2023) 24 g 3   esomeprazole (NEXIUM) 40 MG capsule Take  1 capsule (40 mg total) by mouth daily at 12 noon. Take 1 Daily (Patient not taking: Reported on 10/12/2023) 90 capsule 3   gabapentin (NEURONTIN) 300 MG capsule Take 1 capsule (300 mg total) by mouth at bedtime. 1 capsule at bedtime (Patient not taking: Reported on 10/12/2023) 180 capsule 3   No current facility-administered medications for this visit.    Physical Exam:     BP 110/70   Pulse (!) 51   Ht 6\' 1"  (1.854 m)   Wt 235 lb (106.6 kg)   BMI 31.00 kg/m   GENERAL:  Pleasant male in  NAD PSYCH: : Cooperative, normal affect NEURO: Alert and oriented x 3, no focal neurologic deficits   IMPRESSION and PLAN:    1) Cologuard positive - Schedule colonoscopy to evaluate recent positive Cologuard - Eliquis hold - Patient would like to proceed with colonoscopy as soon as possible.  Will schedule to be done this week on 11/13 - Dulcolax 10 mg tonight and tomorrow morning - Start clear liquid diet this evening and carry through tomorrow then bowel preparation per protocol  2) Atrial flutter s/p cardioversion 3) Chronic anticoagulation 4) CHF with preserved EF - Has been provided clearance by his Cardiologist to proceed with colonoscopy - Hold Eliquis x 2 days.  Has already received cardiology clearance for this as well - Will message our Anesthesia staff for chart evaluation and expedited scheduling  The indications, risks, and benefits of colonoscopy were explained to the patient in detail. Risks include but are not limited to bleeding, perforation, adverse reaction to medications, and cardiopulmonary compromise. Sequelae include but are not limited to the possibility of surgery, hospitalization, and mortality. The patient verbalized understanding and wished to proceed. All questions answered, referred to the scheduler and bowel prep ordered. Further recommendations pending results of the exam.             Shellia Cleverly ,DO, FACG 10/12/2023, 9:58 AM

## 2023-10-12 NOTE — Patient Instructions (Addendum)
_______________________________________________________  If your blood pressure at your visit was 140/90 or greater, please contact your primary care physician to follow up on this.  _______________________________________________________  If you are age 70 or older, your body mass index should be between 23-30. Your Body mass index is 31 kg/m. If this is out of the aforementioned range listed, please consider follow up with your Primary Care Provider.  If you are age 26 or younger, your body mass index should be between 19-25. Your Body mass index is 31 kg/m. If this is out of the aformentioned range listed, please consider follow up with your Primary Care Provider.   ________________________________________________________  The [facility] providers would like to encourage you to use [facility] to communicate with providers for non-urgent requests or questions.  Due to long hold times on the telephone, sending your provider a message by [facility] may be a faster and more efficient way to get a response.  Please allow 48 business hours for a response.  Please remember that this is for non-urgent requests.  _______________________________________________________  Bonita Quin have been scheduled for a colonoscopy. Please follow written instructions given to you at your visit today.   Please pick up your prep supplies at the pharmacy within the next 1-3 days.  If you use inhalers (even only as needed), please bring them with you on the day of your procedure.  DO NOT TAKE 7 DAYS PRIOR TO TEST- Trulicity (dulaglutide) Ozempic, Wegovy (semaglutide) Mounjaro (tirzepatide) Bydureon Bcise (exanatide extended release)  DO NOT TAKE 1 DAY PRIOR TO YOUR TEST Rybelsus (semaglutide) Adlyxin (lixisenatide) Victoza (liraglutide) Byetta (exanatide) ___________________________________________________________________________  Due to recent changes in healthcare laws, you may see the results of your imaging and  laboratory studies on MyChart before your provider has had a chance to review them.  We understand that in some cases there may be results that are confusing or concerning to you. Not all laboratory results come back in the same time frame and the provider may be waiting for multiple results in order to interpret others.  Please give [place] 48 hours in order for your provider to thoroughly review all the results before contacting the office for clarification of your results.   It was a pleasure to see you today!  Vito Cirigliano, D.O.

## 2023-10-12 NOTE — Telephone Encounter (Signed)
   Name: [name]  DOB: 1950/12/22  MRN: [id number]   Primary Cardiologist: Chilton Si, MD  Chart reviewed as part of pre-operative protocol coverage. [name] was last seen on 09/23/2023 by Dr. Elberta Fortis and 09/25/2023 by Dr. Duke Salvia.  He was doing well at that time. He is independent with ADLs and performs yard work without difficulty.   Therefore, based on ACC/AHA guidelines, the patient would be an acceptable risk for the planned procedure without further cardiovascular testing.   Per Pharm D, patient may hold Eliquis for 2 days prior to procedure. Patient will not need bridging with Lovenox (enoxaparin) around procedure.    I will route this recommendation to the requesting party via Epic fax function and remove from pre-op pool. Please call with questions.  Carlos Levering, NP 10/12/2023, 7:26 AM

## 2023-10-14 ENCOUNTER — Ambulatory Visit: Payer: PPO | Admitting: Gastroenterology

## 2023-10-14 ENCOUNTER — Encounter: Payer: Self-pay | Admitting: Gastroenterology

## 2023-10-14 VITALS — BP 112/85 | HR 59 | Temp 98.0°F | Resp 14 | Ht 73.0 in | Wt 235.0 lb

## 2023-10-14 DIAGNOSIS — R195 Other fecal abnormalities: Secondary | ICD-10-CM

## 2023-10-14 DIAGNOSIS — K5730 Diverticulosis of large intestine without perforation or abscess without bleeding: Secondary | ICD-10-CM | POA: Diagnosis not present

## 2023-10-14 DIAGNOSIS — D128 Benign neoplasm of rectum: Secondary | ICD-10-CM | POA: Diagnosis not present

## 2023-10-14 DIAGNOSIS — K641 Second degree hemorrhoids: Secondary | ICD-10-CM

## 2023-10-14 DIAGNOSIS — E669 Obesity, unspecified: Secondary | ICD-10-CM | POA: Diagnosis not present

## 2023-10-14 DIAGNOSIS — D122 Benign neoplasm of ascending colon: Secondary | ICD-10-CM

## 2023-10-14 DIAGNOSIS — Z1211 Encounter for screening for malignant neoplasm of colon: Secondary | ICD-10-CM

## 2023-10-14 DIAGNOSIS — I10 Essential (primary) hypertension: Secondary | ICD-10-CM | POA: Diagnosis not present

## 2023-10-14 DIAGNOSIS — D124 Benign neoplasm of descending colon: Secondary | ICD-10-CM | POA: Diagnosis not present

## 2023-10-14 DIAGNOSIS — D120 Benign neoplasm of cecum: Secondary | ICD-10-CM

## 2023-10-14 DIAGNOSIS — D125 Benign neoplasm of sigmoid colon: Secondary | ICD-10-CM

## 2023-10-14 MED ORDER — SODIUM CHLORIDE 0.9 % IV SOLN: 500.0000 mL | Freq: Once | INTRAVENOUS | Status: DC

## 2023-10-14 NOTE — Op Note (Signed)
[facility] Patient Name: [name] Procedure Date: 10/14/2023 1:19 PM MRN: [id number] Endoscopist: Doristine Locks , MD, [id number] Age: 72 Referring MD:  Date of Birth: 31-Oct-1951 Gender: Male Account #: [id number] Procedure:                Colonoscopy Indications:              Positive Cologuard test Medicines:                Monitored Anesthesia Care Procedure:                Pre-Anesthesia Assessment:                           - Prior to the procedure, a History and Physical                            was performed, and patient medications and                            allergies were reviewed. The patient's tolerance of                            previous anesthesia was also reviewed. The risks                            and benefits of the procedure and the sedation                            options and risks were discussed with the patient.                            All questions were answered, and informed consent                            was obtained. Prior Anticoagulants: The patient has                            taken Eliquis (apixaban), last dose was 2 days                            prior to procedure. ASA Grade Assessment: III - A                            patient with severe systemic disease. After                            reviewing the risks and benefits, the patient was                            deemed in satisfactory condition to undergo the                            procedure.  After obtaining informed consent, the colonoscope                            was passed under direct vision. Throughout the                            procedure, the patient's blood pressure, pulse, and                            oxygen saturations were monitored continuously. The                            Olympus Scope SN [id number] was introduced through the                            anus and advanced to the the terminal ileum. The                             colonoscopy was performed without difficulty. The                            patient tolerated the procedure well. The quality                            of the bowel preparation was good. The terminal                            ileum, ileocecal valve, appendiceal orifice, and                            rectum were photographed. Scope In: 1:22:05 PM Scope Out: 1:44:15 PM Scope Withdrawal Time: 0 hours 19 minutes 29 seconds  Total Procedure Duration: 0 hours 22 minutes 10 seconds  Findings:                 The perianal and digital rectal examinations were                            normal.                           Eight sessile polyps were found in the ascending                            colon (5) and cecum (3). The polyps were 3 to 6 mm                            in size. These polyps were removed with a cold                            snare. Resection and retrieval were complete.                            Estimated blood loss was minimal.  Four sessile polyps were found in the sigmoid colon                            (1) and descending colon (3). The polyps were 3 to                            5 mm in size. These polyps were removed with a cold                            snare. Resection and retrieval were complete.                            Estimated blood loss was minimal.                           A 2 mm polyp was found in the rectum. The polyp was                            sessile. The polyp was removed with a cold snare.                            Resection and retrieval were complete. Estimated                            blood loss was minimal.                           A 12 mm polyp was found in the rectum. The polyp                            was pedunculated. The polyp was removed with a hot                            snare. Resection and retrieval were complete.                            Estimated blood loss was minimal.                            A few small-mouthed diverticula were found in the                            sigmoid colon.                           Non-bleeding internal hemorrhoids were found during                            retroflexion. The hemorrhoids were small and Grade                            II (internal hemorrhoids that prolapse but reduce  spontaneously).                           The terminal ileum appeared normal. Complications:            No immediate complications. Estimated Blood Loss:     Estimated blood loss was minimal. Impression:               - Eight 3 to 6 mm polyps in the ascending colon and                            in the cecum, removed with a cold snare. Resected                            and retrieved.                           - Four 3 to 5 mm polyps in the sigmoid colon and in                            the descending colon, removed with a cold snare.                            Resected and retrieved.                           - One 2 mm polyp in the rectum, removed with a cold                            snare. Resected and retrieved.                           - One 12 mm polyp in the rectum, removed with a hot                            snare. Resected and retrieved.                           - Diverticulosis in the sigmoid colon.                           - Non-bleeding internal hemorrhoids.                           - The examined portion of the ileum was normal. Recommendation:           - Patient has a contact number available for                            emergencies. The signs and symptoms of potential                            delayed complications were discussed with the  patient. Return to normal activities tomorrow.                            Written discharge instructions were provided to the                            patient.                           - Resume previous diet.                            - Continue present medications.                           - Await pathology results.                           - Repeat colonoscopy for surveillance based on                            pathology results.                           - Resume Eliquis (apixaban) at prior dose in 2 days.                           - Return to GI clinic PRN. Doristine Locks, MD 10/14/2023 1:52:36 PM

## 2023-10-14 NOTE — Progress Notes (Signed)
GASTROENTEROLOGY PROCEDURE H&P NOTE   Primary Care Physician: Corwin Levins, MD    Reason for Procedure:  Positive Cologuard  Plan:    Colonoscopy  Patient is appropriate for endoscopic procedure(s) in the ambulatory ([facility]) setting.  The nature of the procedure, as well as the risks, benefits, and alternatives were carefully and thoroughly reviewed with the patient. Ample time for discussion and questions allowed. The patient understood, was satisfied, and agreed to proceed.     HPI: [name] is a 72 y.o. male who presents for colonoscopy for evaluation of positive Cologuard.  Patient was most recently seen in the Gastroenterology Clinic on 10/12/2023.  No interval change in medical history since that appointment. Please refer to that note for full details regarding GI history and clinical presentation.   Has been holding his Eliquis for procedure today.  Endoscopic History: - Colonoscopy (01/2005): Normal - Cologuard negative 07/2020 - 09/2023: Cologuard positive  Past Medical History:  Diagnosis Date   ALLERGIC RHINITIS 12/31/2007   Aortic atherosclerosis (HCC) 08/26/2022   Arthritis    Chronic combined systolic and diastolic heart failure (HCC) 06/14/2021   COUGH, CHRONIC 07/18/2007   DEGENERATIVE JOINT DISEASE 07/18/2007   Extrinsic asthma, unspecified 09/08/2009   GERD 07/18/2007   HYPERLIPIDEMIA 08/14/2010   Hypertension    Impaired glucose tolerance 09/23/2011   OBESITY 07/18/2007   Pneumonia     Past Surgical History:  Procedure Laterality Date   CARDIOVERSION N/A 12/28/2020   Procedure: CARDIOVERSION;  Surgeon: Chilton Si, MD;  Location: [facility] ENDOSCOPY;  Service: Cardiovascular;  Laterality: N/A;   CARDIOVERSION N/A 09/04/2022   Procedure: CARDIOVERSION;  Surgeon: Jake Bathe, MD;  Location: [facility] ENDOSCOPY;  Service: Cardiovascular;  Laterality: N/A;   CARDIOVERSION N/A 02/05/2023   Procedure: CARDIOVERSION;  Surgeon: Jodelle Red, MD;   Location: [facility] ENDOSCOPY;  Service: Cardiovascular;  Laterality: N/A;   DG FEMUR LEFT  ([facility] HX)     rod placed   LUMBAR DISC SURGERY     TEE WITHOUT CARDIOVERSION N/A 12/28/2020   Procedure: TRANSESOPHAGEAL ECHOCARDIOGRAM (TEE);  Surgeon: Chilton Si, MD;  Location: [facility] ENDOSCOPY;  Service: Cardiovascular;  Laterality: N/A;   TEE WITHOUT CARDIOVERSION N/A 09/04/2022   Procedure: TRANSESOPHAGEAL ECHOCARDIOGRAM (TEE);  Surgeon: Jake Bathe, MD;  Location: [facility] ENDOSCOPY;  Service: Cardiovascular;  Laterality: N/A;   TOOTH EXTRACTION     TOTAL KNEE ARTHROPLASTY Right 07/26/2018   Procedure: RIGHT TOTAL KNEE ARTHROPLASTY;  Surgeon: Kerrin Champagne, MD;  Location: [facility] OR;  Service: Orthopedics;  Laterality: Right;    Prior to Admission medications   Medication Sig Start Date End Date Taking? Authorizing Provider  acetaminophen (TYLENOL) 650 MG CR tablet Take 650 mg by mouth daily.    [provider]  albuterol (PROAIR HFA) 108 (90 Base) MCG/ACT inhaler Inhale 2 puffs into the lungs every 6 (six) hours as needed for wheezing or shortness of breath. Patient not taking: Reported on 10/12/2023 04/09/22   Corwin Levins, MD  apixaban (ELIQUIS) 5 MG TABS tablet Take 1 tablet (5 mg total) by mouth 2 (two) times daily. 03/02/23   Chilton Si, MD  budesonide-formoterol [facility]) 80-4.5 MCG/ACT inhaler Inhale 2 puffs into the lungs 2 (two) times daily. Patient taking differently: Inhale 2 puffs into the lungs 2 (two) times daily as needed (Wheezing / SOB). 04/04/22   Corwin Levins, MD  cyanocobalamin (VITAMIN B12) 1000 MCG tablet Take 1,000 mcg by mouth every other day.    [provider]  diclofenac Sodium (VOLTAREN) 1 % GEL Apply 1 Application topically 4 (four) times daily as needed (pain).    [provider]  esomeprazole (NEXIUM) 40 MG capsule Take 1 capsule (40 mg total) by mouth daily at 12 noon. Take 1 Daily Patient not taking: Reported on 10/12/2023 04/03/22   Corwin Levins, MD  gabapentin (NEURONTIN) 300 MG capsule Take 1 capsule (300 mg total) by mouth at bedtime. 1 capsule at bedtime Patient not taking: Reported on 10/12/2023 04/13/23   Tyrell Antonio, MD  Melatonin 10 MG TABS Take 5 mg by mouth at bedtime as needed (sleep).    [provider]  metoprolol succinate (TOPROL-XL) 25 MG 24 hr tablet Take 1.5 tablets (37.5 mg total) by mouth daily. Patient taking differently: Take 37.5 mg by mouth at bedtime. 12/23/22   Alver Sorrow, NP  Multiple Vitamins-Minerals (CENTRUM ADULTS PO) Take 1 tablet by mouth 3 (three) times a week.    [provider]  Na Sulfate-K Sulfate-Mg Sulf (SUPREP BOWEL PREP KIT) 17.5-3.13-1.6 GM/177ML SOLN Take 1 kit by mouth as directed. 10/12/23   Siegfried Vieth V, DO  oxymetazoline (AFRIN) 0.05 % nasal spray Place 1 spray into both nostrils 2 (two) times daily as needed for congestion.    [provider]  rosuvastatin (CRESTOR) 20 MG tablet TAKE 1 TABLET BY MOUTH EVERY DAY IN THE EVENING 06/01/23   Chilton Si, MD    Current Outpatient Medications  Medication Sig Dispense Refill   acetaminophen (TYLENOL) 650 MG CR tablet Take 650 mg by mouth daily.     albuterol (PROAIR HFA) 108 (90 Base) MCG/ACT inhaler Inhale 2 puffs into the lungs every 6 (six) hours as needed for wheezing or shortness of breath. (Patient not taking: Reported on 10/12/2023) 24 g 3   apixaban (ELIQUIS) 5 MG TABS tablet Take 1 tablet (5 mg total) by mouth 2 (two) times daily. 180 tablet 1   budesonide-formoterol (SYMBICORT) 80-4.5 MCG/ACT inhaler Inhale 2 puffs into the lungs 2 (two) times daily. (Patient taking differently: Inhale 2 puffs into the lungs 2 (two) times daily as needed (Wheezing / SOB).) 3 each 3   cyanocobalamin (VITAMIN B12) 1000 MCG tablet Take 1,000 mcg by mouth every other day.     diclofenac Sodium (VOLTAREN) 1 % GEL Apply 1 Application topically 4 (four) times daily as needed (pain).     esomeprazole  (NEXIUM) 40 MG capsule Take 1 capsule (40 mg total) by mouth daily at 12 noon. Take 1 Daily (Patient not taking: Reported on 10/12/2023) 90 capsule 3   gabapentin (NEURONTIN) 300 MG capsule Take 1 capsule (300 mg total) by mouth at bedtime. 1 capsule at bedtime (Patient not taking: Reported on 10/12/2023) 180 capsule 3   Melatonin 10 MG TABS Take 5 mg by mouth at bedtime as needed (sleep).     metoprolol succinate (TOPROL-XL) 25 MG 24 hr tablet Take 1.5 tablets (37.5 mg total) by mouth daily. (Patient taking differently: Take 37.5 mg by mouth at bedtime.) 135 tablet 3   Multiple Vitamins-Minerals (CENTRUM ADULTS PO) Take 1 tablet by mouth 3 (three) times a week.     Na Sulfate-K Sulfate-Mg Sulf (SUPREP BOWEL PREP KIT) 17.5-3.13-1.6 GM/177ML SOLN Take 1 kit by mouth as directed. 324 mL 0   oxymetazoline (AFRIN) 0.05 % nasal spray Place 1 spray into both nostrils 2 (two) times daily as needed for congestion.     rosuvastatin (CRESTOR) 20 MG tablet TAKE 1 TABLET BY MOUTH EVERY DAY IN  THE EVENING 90 tablet 2   Current Facility-Administered Medications  Medication Dose Route Frequency Provider Last Rate Last Admin   0.9 %  sodium chloride infusion  500 mL Intravenous Once Manly Nestle V, DO        Allergies as of 10/14/2023   (No Known Allergies)    Family History  Problem Relation Age of Onset   Lupus Father    Cerebral palsy Sister    Diabetes Maternal Grandmother     Social History   Socioeconomic History   Marital status: Married    Spouse name: Not on file   Number of children: 1   Years of education: Not on file   Highest education level: Not on file  Occupational History   Occupation: truck driver  Tobacco Use   Smoking status: Never   Smokeless tobacco: Never   Tobacco comments:    Marijuana 30 years  Vaping Use   Vaping status: Never Used  Substance and Sexual Activity   Alcohol use: Yes    Alcohol/week: 2.0 - 4.0 standard drinks of alcohol    Types: 2 - 4 Cans of  beer per week    Comment: moderate   Drug use: Not Currently    Types: Marijuana    Comment: as a teenager   Sexual activity: Not on file  Other Topics Concern   Not on file  Social History Narrative   Are you right handed or left handed? left   Are you currently employed ?    What is your current occupation?retired   Do you live at home alone?   Who lives with you? Wife and grandson   What type of home do you live in: 1 story or 2 story? one   Caffeine 1-2 a day    Social Determinants of Health   Financial Resource Strain: Not on file  Food Insecurity: Not on file  Transportation Needs: Not on file  Physical Activity: Not on file  Stress: Not on file  Social Connections: Unknown (04/13/2022)   Received from [facility]   Social Network    Social Network: Not on file  Intimate Partner Violence: Unknown (03/06/2022)   Received from [facility], [facility]   HITS    Physically Hurt: Not on file    Insult or Talk Down To: Not on file    Threaten Physical Harm: Not on file    Scream or Curse: Not on file    Physical Exam: Vital signs in last 24 hours: @BP  (!) 145/73   Pulse 65   Temp 98 F (36.7 C) (Skin)   Ht 6\' 1"  (1.854 m)   Wt 235 lb (106.6 kg)   SpO2 97%   BMI 31.00 kg/m  GEN: NAD EYE: Sclerae anicteric ENT: MMM CV: Non-tachycardic Pulm: CTA b/l GI: Soft, NT/ND NEURO:  Alert & Oriented x 3   Doristine Locks, DO [facility]   10/14/2023 1:08 PM

## 2023-10-14 NOTE — Progress Notes (Signed)
Report to PACU, RN, vss, BBS= Clear.  

## 2023-10-14 NOTE — Patient Instructions (Signed)
Handouts given on polyps, diverticulosis and hemorrhoids.  YOU HAD AN ENDOSCOPIC PROCEDURE TODAY AT [facility]:   Refer to the procedure report that was given to you for any specific questions about what was found during the examination.  If the procedure report does not answer your questions, please call your gastroenterologist to clarify.  If you requested that your care partner not be given the details of your procedure findings, then the procedure report has been included in a sealed envelope for you to review at your convenience later.  YOU SHOULD EXPECT: Some feelings of bloating in the abdomen. Passage of more gas than usual.  Walking can help get rid of the air that was put into your GI tract during the procedure and reduce the bloating. If you had a lower endoscopy (such as a colonoscopy or flexible sigmoidoscopy) you may notice spotting of blood in your stool or on the toilet paper. If you underwent a bowel prep for your procedure, you may not have a normal bowel movement for a few days.  Please Note:  You might notice some irritation and congestion in your nose or some drainage.  This is from the oxygen used during your procedure.  There is no need for concern and it should clear up in a day or so.  SYMPTOMS TO REPORT IMMEDIATELY:  Following lower endoscopy (colonoscopy or flexible sigmoidoscopy):  Excessive amounts of blood in the stool  Significant tenderness or worsening of abdominal pains  Swelling of the abdomen that is new, acute  Fever of 100F or higher  For urgent or emergent issues, a gastroenterologist can be reached at any hour by calling [phone]. Do not use MyChart messaging for urgent concerns.    DIET:  We do recommend a small meal at first, but then you may proceed to your regular diet.  Drink plenty of fluids but you should avoid alcoholic beverages for 24 hours.  ACTIVITY:  You should plan to take it easy for the rest of today and you should  NOT DRIVE or use heavy machinery until tomorrow (because of the sedation medicines used during the test).    FOLLOW UP: Our staff will call the number listed on your records the next business day following your procedure.  We will call around 7:15- 8:00 am to check on you and address any questions or concerns that you may have regarding the information given to you following your procedure. If we do not reach you, we will leave a message.     If any biopsies were taken you will be contacted by phone or by letter within the next 1-3 weeks.  Please call us at [phone] if you have not heard about the biopsies in 3 weeks.    SIGNATURES/CONFIDENTIALITY: You and/or your care partner have signed paperwork which will be entered into your electronic medical record.  These signatures attest to the fact that that the information above on your After Visit Summary has been reviewed and is understood.  Full responsibility of the confidentiality of this discharge information lies with you and/or your care-partner.

## 2023-10-14 NOTE — Progress Notes (Signed)
Called to room to assist during endoscopic procedure.  Patient ID and intended procedure confirmed with present staff. Received instructions for my participation in the procedure from the performing physician.  

## 2023-10-15 ENCOUNTER — Telehealth: Payer: Self-pay

## 2023-10-15 NOTE — Telephone Encounter (Signed)
  Follow up Call-     10/14/2023    1:05 PM  Call back number  Post procedure Call Back phone  # [phone]  Permission to leave phone message Yes     Patient questions:  Do you have a fever, pain , or abdominal swelling? No. Pain Score  0 *  Have you tolerated food without any problems? Yes.    Have you been able to return to your normal activities? Yes.    Do you have any questions about your discharge instructions: Diet   No. Medications  No. Follow up visit  No.  Do you have questions or concerns about your Care? No.  Actions: * If pain score is 4 or above: No action needed, pain <4.

## 2023-10-19 LAB — SURGICAL PATHOLOGY

## 2023-11-02 ENCOUNTER — Ambulatory Visit (INDEPENDENT_AMBULATORY_CARE_PROVIDER_SITE_OTHER): Payer: PPO

## 2023-11-02 VITALS — BP 118/80 | HR 61 | Ht 72.0 in | Wt 234.0 lb

## 2023-11-02 DIAGNOSIS — Z0000 Encounter for general adult medical examination without abnormal findings: Secondary | ICD-10-CM | POA: Diagnosis not present

## 2023-11-02 NOTE — Progress Notes (Signed)
Subjective:   [name] is a 72 y.o. male who presents for an Initial Medicare Annual Wellness Visit.  Visit Complete: Virtual I connected with  [name] on 11/02/23 by a audio enabled telemedicine application and verified that I am speaking with the correct person using two identifiers.  Patient Location: Home  Provider Location: Office/Clinic  I discussed the limitations of evaluation and management by telemedicine. The patient expressed understanding and agreed to proceed.  Vital Signs: Because this visit was a virtual/telehealth visit, some criteria may be missing or patient reported. Any vitals not documented were not able to be obtained and vitals that have been documented are patient reported.   Cardiac Risk Factors include: advanced age (>47men, >70 women);hypertension;male gender;Other (see comment);dyslipidemia, Risk factor comments: DVT, A-Fib, Aortic atherosclerosis, CKD     Objective:    Today's Vitals   11/02/23 0943 11/02/23 0946  BP:  118/80  Pulse:  61  Weight: 234 lb (106.1 kg)   Height: 6' (1.829 m)    Body mass index is 31.74 kg/m.     11/02/2023    9:53 AM 09/02/2023    8:57 AM 02/20/2023    9:27 AM 09/04/2022    9:40 AM 04/06/2022    9:51 PM 12/22/2020    9:16 AM 08/27/2018    8:39 AM  Advanced Directives  Does Patient Have a Medical Advance Directive? Yes No Yes No Yes No No  Type of Estate agent of [place];Living will  Living will;Healthcare Power of [employer] Power of [place];Living will    Copy of Healthcare Power of Attorney in Chart? No - copy requested  No - copy requested  No - copy requested    Would patient like information on creating a medical advance directive?    No - Patient declined   No - Patient declined    Current Medications (verified) Outpatient Encounter Medications as of 11/02/2023  Medication Sig   acetaminophen (TYLENOL) 650 MG CR tablet Take 650 mg by mouth daily.   albuterol  (PROAIR HFA) 108 (90 Base) MCG/ACT inhaler Inhale 2 puffs into the lungs every 6 (six) hours as needed for wheezing or shortness of breath.   apixaban (ELIQUIS) 5 MG TABS tablet Take 1 tablet (5 mg total) by mouth 2 (two) times daily.   budesonide-formoterol (SYMBICORT) 80-4.5 MCG/ACT inhaler Inhale 2 puffs into the lungs 2 (two) times daily. (Patient taking differently: Inhale 2 puffs into the lungs 2 (two) times daily as needed (Wheezing / SOB).)   cyanocobalamin (VITAMIN B12) 1000 MCG tablet Take 1,000 mcg by mouth every other day.   diclofenac Sodium (VOLTAREN) 1 % GEL Apply 1 Application topically 4 (four) times daily as needed (pain).   esomeprazole (NEXIUM) 40 MG capsule Take 1 capsule (40 mg total) by mouth daily at 12 noon. Take 1 Daily   gabapentin (NEURONTIN) 300 MG capsule Take 1 capsule (300 mg total) by mouth at bedtime. 1 capsule at bedtime   Melatonin 10 MG TABS Take 5 mg by mouth at bedtime as needed (sleep).   metoprolol succinate (TOPROL-XL) 25 MG 24 hr tablet Take 1.5 tablets (37.5 mg total) by mouth daily. (Patient taking differently: Take 37.5 mg by mouth at bedtime.)   Multiple Vitamins-Minerals (CENTRUM ADULTS PO) Take 1 tablet by mouth 3 (three) times a week.   oxymetazoline (AFRIN) 0.05 % nasal spray Place 1 spray into both nostrils 2 (two) times daily as needed for congestion.   rosuvastatin (CRESTOR)  20 MG tablet TAKE 1 TABLET BY MOUTH EVERY DAY IN THE EVENING   No facility-administered encounter medications on file as of 11/02/2023.    Allergies (verified) Patient has no known allergies.   History: Past Medical History:  Diagnosis Date   ALLERGIC RHINITIS 12/31/2007   Aortic atherosclerosis (HCC) 08/26/2022   Arthritis    Blood transfusion without reported diagnosis    Chronic combined systolic and diastolic heart failure (HCC) 06/14/2021   COUGH, CHRONIC 07/18/2007   DEGENERATIVE JOINT DISEASE 07/18/2007   Extrinsic asthma, unspecified 09/08/2009   GERD  07/18/2007   HYPERLIPIDEMIA 08/14/2010   Hypertension    Impaired glucose tolerance 09/23/2011   OBESITY 07/18/2007   Pneumonia    Past Surgical History:  Procedure Laterality Date   CARDIOVERSION N/A 12/28/2020   Procedure: CARDIOVERSION;  Surgeon: Chilton Si, MD;  Location: [facility] ENDOSCOPY;  Service: Cardiovascular;  Laterality: N/A;   CARDIOVERSION N/A 09/04/2022   Procedure: CARDIOVERSION;  Surgeon: Jake Bathe, MD;  Location: [facility] ENDOSCOPY;  Service: Cardiovascular;  Laterality: N/A;   CARDIOVERSION N/A 02/05/2023   Procedure: CARDIOVERSION;  Surgeon: Jodelle Red, MD;  Location: [facility] ENDOSCOPY;  Service: Cardiovascular;  Laterality: N/A;   COLONOSCOPY     DG FEMUR LEFT  ([facility] HX)     rod placed   LUMBAR DISC SURGERY     TEE WITHOUT CARDIOVERSION N/A 12/28/2020   Procedure: TRANSESOPHAGEAL ECHOCARDIOGRAM (TEE);  Surgeon: Chilton Si, MD;  Location: [facility] ENDOSCOPY;  Service: Cardiovascular;  Laterality: N/A;   TEE WITHOUT CARDIOVERSION N/A 09/04/2022   Procedure: TRANSESOPHAGEAL ECHOCARDIOGRAM (TEE);  Surgeon: Jake Bathe, MD;  Location: [facility] ENDOSCOPY;  Service: Cardiovascular;  Laterality: N/A;   TOOTH EXTRACTION     TOTAL KNEE ARTHROPLASTY Right 07/26/2018   Procedure: RIGHT TOTAL KNEE ARTHROPLASTY;  Surgeon: Kerrin Champagne, MD;  Location: [facility] OR;  Service: Orthopedics;  Laterality: Right;   Family History  Problem Relation Age of Onset   Lupus Father    Cerebral palsy Sister    Diabetes Maternal Grandmother    Colon cancer Neg Hx    Esophageal cancer Neg Hx    Rectal cancer Neg Hx    Stomach cancer Neg Hx    Social History   Socioeconomic History   Marital status: Married    Spouse name: Darl Pikes   Number of children: 1   Years of education: Not on file   Highest education level: Not on file  Occupational History   Occupation: Retired/truck driver  Tobacco Use   Smoking status: Never   Smokeless tobacco: Never   Tobacco comments:    Marijuana  30 years  Vaping Use   Vaping status: Never Used  Substance and Sexual Activity   Alcohol use: Yes    Alcohol/week: 2.0 - 4.0 standard drinks of alcohol    Types: 2 - 4 Cans of beer per week    Comment: moderate   Drug use: Not Currently    Types: Marijuana    Comment: as a teenager   Sexual activity: Not on file  Other Topics Concern   Not on file  Social History Narrative   Are you right handed or left handed? left   Are you currently employed ?    What is your current occupation?retired   Do you live at home alone?   Who lives with you? Wife and grandson   What type of home do you live in: 1 story or 2 story? one   Caffeine 1-2 a day  Social Determinants of Health   Financial Resource Strain: Low Risk  (11/02/2023)   Overall Financial Resource Strain (CARDIA)    Difficulty of Paying Living Expenses: Not hard at all  Food Insecurity: No Food Insecurity (11/02/2023)   Hunger Vital Sign    Worried About Running Out of Food in the Last Year: Never true    Ran Out of Food in the Last Year: Never true  Transportation Needs: No Transportation Needs (11/02/2023)   PRAPARE - Administrator, Civil Service (Medical): No    Lack of Transportation (Non-Medical): No  Physical Activity: Inactive (11/02/2023)   Exercise Vital Sign    Days of Exercise per Week: 0 days    Minutes of Exercise per Session: 0 min  Stress: No Stress Concern Present (11/02/2023)   [employer] of Occupational Health - Occupational Stress Questionnaire    Feeling of Stress : Not at all  Social Connections: Moderately Isolated (11/02/2023)   Social Connection and Isolation Panel [NHANES]    Frequency of Communication with Friends and Family: More than three times a week    Frequency of Social Gatherings with Friends and Family: More than three times a week    Attends Religious Services: Never    Database administrator or Organizations: No    Attends Engineer, structural: Never     Marital Status: Married    Tobacco Counseling Counseling given: Not Answered Tobacco comments: Marijuana 30 years   Clinical Intake:  Pre-visit preparation completed: Yes  Pain : No/denies pain     BMI - recorded: 31.74 Nutritional Status: BMI > 30  Obese Nutritional Risks: None Diabetes: No  How often do you need to have someone help you when you read instructions, pamphlets, or other written materials from your doctor or pharmacy?: 1 - Never  Interpreter Needed?: No  Information entered by :: Kristion Holifield, RMA   Activities of Daily Living    11/02/2023    9:44 AM  In your present state of health, do you have any difficulty performing the following activities:  Hearing? 0  Vision? 0  Difficulty concentrating or making decisions? 0  Walking or climbing stairs? 0  Dressing or bathing? 0  Doing errands, shopping? 0  Preparing Food and eating ? N  Using the Toilet? N  In the past six months, have you accidently leaked urine? N  Do you have problems with loss of bowel control? N  Managing your Medications? N  Managing your Finances? N  Housekeeping or managing your Housekeeping? N    Patient Care Team: Corwin Levins, MD as PCP - General Chilton Si, MD as PCP - Cardiology (Cardiology) Regan Lemming, MD as PCP - Electrophysiology (Cardiology) Blair Promise, OD (Optometry)  Indicate any recent Medical Services you may have received from other than [facility] providers in the past year (date may be approximate).     Assessment:   This is a routine wellness examination for Nikil.  Hearing/Vision screen Hearing Screening - Comments:: Denies hearing difficulties   Vision Screening - Comments:: Denies any vision issues   Goals Addressed               This Visit's Progress     Patient Stated (pt-stated)        To Drop a few pounds      Depression Screen    11/02/2023    9:58 AM 09/18/2023    8:50 AM 04/03/2022    4:42 PM 04/03/2022  4:08 PM 12/21/2020    3:49 PM 06/15/2020    3:32 PM 06/15/2020    3:15 PM  PHQ 2/9 Scores  PHQ - 2 Score 0 0 0 0 0 0 0  PHQ- 9 Score 0   1       Fall Risk    11/02/2023    9:54 AM 09/18/2023    8:50 AM 09/02/2023    8:57 AM 02/20/2023    9:27 AM 04/03/2022    4:42 PM  Fall Risk   Falls in the past year? 0 0 0 0 0  Number falls in past yr:  0 0 0 0  Injury with Fall?  0 0 0 0  Risk for fall due to : No Fall Risks No Fall Risks     Follow up Falls evaluation completed;Falls prevention discussed Falls evaluation completed Falls evaluation completed Falls evaluation completed     MEDICARE RISK AT HOME: Medicare Risk at Home Any stairs in or around the home?: Yes If so, are there any without handrails?: Yes Home free of loose throw rugs in walkways, pet beds, electrical cords, etc?: Yes Adequate lighting in your home to reduce risk of falls?: Yes Life alert?: No Use of a cane, walker or w/c?: No Grab bars in the bathroom?: Yes Shower chair or bench in shower?: Yes Elevated toilet seat or a handicapped toilet?: Yes  TIMED UP AND GO:  Was the test performed? No    Cognitive Function:        11/02/2023    9:55 AM  6CIT Screen  What Year? 0 points  What month? 0 points  What time? 0 points  Count back from 20 0 points  Months in reverse 0 points  Repeat phrase 0 points  Total Score 0 points    Immunizations Immunization History  Administered Date(s) Administered   Fluad Quad(high Dose 65+) 12/16/2022   Fluad Trivalent(High Dose 65+) 09/18/2023   Influenza, High Dose Seasonal PF 08/10/2018   Influenza,inj,Quad PF,6+ Mos 01/17/2015   Influenza-Unspecified 12/01/2017   PFIZER(Purple Top)SARS-COV-2 Vaccination 03/01/2020, 03/26/2020   Pneumococcal Conjugate-13 06/15/2020   Pneumococcal Polysaccharide-23 07/28/2018   Tdap 01/17/2015    TDAP status: Up to date  Flu Vaccine status: Up to date  Pneumococcal vaccine status: Up to date  Covid-19 vaccine status:  Declined, Education has been provided regarding the importance of this vaccine but patient still declined. Advised may receive this vaccine at local pharmacy or Health Dept.or vaccine clinic. Aware to provide a copy of the vaccination record if obtained from local pharmacy or Health Dept. Verbalized acceptance and understanding.  Qualifies for Shingles Vaccine? Yes   Zostavax completed No   Shingrix Completed?: No.    Education has been provided regarding the importance of this vaccine. Patient has been advised to call insurance company to determine out of pocket expense if they have not yet received this vaccine. Advised may also receive vaccine at local pharmacy or Health Dept. Verbalized acceptance and understanding.  Screening Tests Health Maintenance  Topic Date Due   Zoster Vaccines- Shingrix (1 of 2) 01/01/2024 (Originally 01/14/1970)   Colonoscopy  10/13/2024   Medicare Annual Wellness (AWV)  11/01/2024   DTaP/Tdap/Td (2 - Td or Tdap) 01/17/2025   Pneumonia Vaccine 57+ Years old  Completed   INFLUENZA VACCINE  Completed   Hepatitis C Screening  Completed   HPV VACCINES  Aged Out   COVID-19 Vaccine  Discontinued   Fecal DNA (Cologuard)  Discontinued  Health Maintenance  There are no preventive care reminders to display for this patient.   Colorectal cancer screening: Type of screening: Colonoscopy. Completed 10/14/2023. Repeat every 1 years  Lung Cancer Screening: (Low Dose CT Chest recommended if Age 59-80 years, 20 pack-year currently smoking OR have quit w/in 15years.) does not qualify.   Lung Cancer Screening Referral: N/A  Additional Screening:  Hepatitis C Screening: does qualify; Completed 01/07/2018  Vision Screening: Recommended annual ophthalmology exams for early detection of glaucoma and other disorders of the eye. Is the patient up to date with their annual eye exam?  Yes  Who is the provider or what is the name of the office in which the patient attends  annual eye exams? Dr. Dion Body If pt is not established with a provider, would they like to be referred to a provider to establish care? No .   Dental Screening: Recommended annual dental exams for proper oral hygiene   Community Resource Referral / Chronic Care Management: CRR required this visit?  No   CCM required this visit?  No    Plan:     I have personally reviewed and noted the following in the patient's chart:   Medical and social history Use of alcohol, tobacco or illicit drugs  Current medications and supplements including opioid prescriptions. Patient is not currently taking opioid prescriptions. Functional ability and status Nutritional status Physical activity Advanced directives List of other physicians Hospitalizations, surgeries, and ER visits in previous 12 months Vitals Screenings to include cognitive, depression, and falls Referrals and appointments  In addition, I have reviewed and discussed with patient certain preventive protocols, quality metrics, and best practice recommendations. A written personalized care plan for preventive services as well as general preventive health recommendations were provided to patient.     Saraya Tirey L Eulala Newcombe, CMA   11/02/2023   After Visit Summary: (MyChart) Due to this being a telephonic visit, the after visit summary with patients personalized plan was offered to patient via MyChart   Nurse Notes: Patient is due for a Shingrix vaccine.  He is up to date on all other health maintenance.  Patient had no concerns to address today.

## 2023-11-02 NOTE — Patient Instructions (Signed)
[name] , Thank you for taking time to come for your Medicare Wellness Visit. I appreciate your ongoing commitment to your health goals. Please review the following plan we discussed and let me know if I can assist you in the future.   Referrals/Orders/Follow-Ups/Clinician Recommendations: You are due for a Shingles vaccine, which you can get at your local pharmacy.  Each day, aim for 6 glasses of water, plenty of protein in your diet and try to get up and walk/ stretch every hour for 5-10 minutes at a time.    This is a list of the screening recommended for you and due dates:  Health Maintenance  Topic Date Due   Zoster (Shingles) Vaccine (1 of 2) 01/01/2024*   Colon Cancer Screening  10/13/2024   Medicare Annual Wellness Visit  11/01/2024   DTaP/Tdap/Td vaccine (2 - Td or Tdap) 01/17/2025   Pneumonia Vaccine  Completed   Flu Shot  Completed   Hepatitis C Screening  Completed   HPV Vaccine  Aged Out   COVID-19 Vaccine  Discontinued   Cologuard (Stool DNA test)  Discontinued  *Topic was postponed. The date shown is not the original due date.    Advanced directives: (Copy Requested) Please bring a copy of your health care power of attorney and living will to the office to be added to your chart at your convenience.  Next Medicare Annual Wellness Visit scheduled for next year: Yes

## 2023-11-04 DIAGNOSIS — L7211 Pilar cyst: Secondary | ICD-10-CM | POA: Diagnosis not present

## 2023-11-04 DIAGNOSIS — L538 Other specified erythematous conditions: Secondary | ICD-10-CM | POA: Diagnosis not present

## 2023-11-04 DIAGNOSIS — L918 Other hypertrophic disorders of the skin: Secondary | ICD-10-CM | POA: Diagnosis not present

## 2023-11-18 ENCOUNTER — Other Ambulatory Visit: Payer: Self-pay | Admitting: Cardiovascular Disease

## 2023-11-18 ENCOUNTER — Other Ambulatory Visit (HOSPITAL_BASED_OUTPATIENT_CLINIC_OR_DEPARTMENT_OTHER): Payer: Self-pay | Admitting: Family

## 2023-11-18 DIAGNOSIS — I4892 Unspecified atrial flutter: Secondary | ICD-10-CM

## 2023-11-18 DIAGNOSIS — I824Y1 Acute embolism and thrombosis of unspecified deep veins of right proximal lower extremity: Secondary | ICD-10-CM

## 2023-11-18 NOTE — Telephone Encounter (Signed)
Eliquis 5mg  refill request received. Patient is 72 years old, weight-106.1kg, Crea-1.24 on 09/18/23, Diagnosis-Afib & Aflutter, and last seen by Dr. Duke Salvia on 09/25/23. Dose is appropriate based on dosing criteria. Will send in refill to requested pharmacy.

## 2024-01-08 DIAGNOSIS — H25032 Anterior subcapsular polar age-related cataract, left eye: Secondary | ICD-10-CM | POA: Diagnosis not present

## 2024-01-08 DIAGNOSIS — H2512 Age-related nuclear cataract, left eye: Secondary | ICD-10-CM | POA: Diagnosis not present

## 2024-01-19 ENCOUNTER — Ambulatory Visit: Payer: PPO | Admitting: Psychology

## 2024-02-11 ENCOUNTER — Telehealth: Payer: Self-pay | Admitting: Cardiovascular Disease

## 2024-02-11 ENCOUNTER — Ambulatory Visit (HOSPITAL_COMMUNITY)
Admission: RE | Admit: 2024-02-11 | Discharge: 2024-02-11 | Disposition: A | Discharge status: Home / Self Care | Source: Ambulatory Visit | Attending: Physician Assistant | Admitting: Physician Assistant

## 2024-02-11 ENCOUNTER — Encounter (HOSPITAL_COMMUNITY): Payer: Self-pay | Admitting: Physician Assistant

## 2024-02-11 VITALS — BP 90/80 | HR 141 | Ht 72.0 in | Wt 240.0 lb

## 2024-02-11 DIAGNOSIS — I110 Hypertensive heart disease with heart failure: Secondary | ICD-10-CM | POA: Insufficient documentation

## 2024-02-11 DIAGNOSIS — J45909 Unspecified asthma, uncomplicated: Secondary | ICD-10-CM | POA: Insufficient documentation

## 2024-02-11 DIAGNOSIS — I5030 Unspecified diastolic (congestive) heart failure: Secondary | ICD-10-CM | POA: Insufficient documentation

## 2024-02-11 DIAGNOSIS — D6869 Other thrombophilia: Secondary | ICD-10-CM

## 2024-02-11 DIAGNOSIS — I4819 Other persistent atrial fibrillation: Secondary | ICD-10-CM

## 2024-02-11 DIAGNOSIS — Z86718 Personal history of other venous thrombosis and embolism: Secondary | ICD-10-CM | POA: Diagnosis not present

## 2024-02-11 DIAGNOSIS — Z79899 Other long term (current) drug therapy: Secondary | ICD-10-CM | POA: Insufficient documentation

## 2024-02-11 DIAGNOSIS — I483 Typical atrial flutter: Secondary | ICD-10-CM | POA: Insufficient documentation

## 2024-02-11 DIAGNOSIS — I484 Atypical atrial flutter: Secondary | ICD-10-CM

## 2024-02-11 DIAGNOSIS — Z7901 Long term (current) use of anticoagulants: Secondary | ICD-10-CM | POA: Diagnosis not present

## 2024-02-11 DIAGNOSIS — E785 Hyperlipidemia, unspecified: Secondary | ICD-10-CM | POA: Diagnosis not present

## 2024-02-11 LAB — BASIC METABOLIC PANEL
Anion gap: 12 (ref 5–15)
BUN: 22 mg/dL (ref 8–23)
CO2: 21 mmol/L — ABNORMAL LOW (ref 22–32)
Calcium: 9.1 mg/dL (ref 8.9–10.3)
Chloride: 106 mmol/L (ref 98–111)
Creatinine, Ser: 1.36 mg/dL — ABNORMAL HIGH (ref 0.61–1.24)
GFR, Estimated: 56 mL/min — ABNORMAL LOW (ref >60)
Glucose, Bld: 129 mg/dL — ABNORMAL HIGH (ref 70–99)
Potassium: 4.3 mmol/L (ref 3.5–5.1)
Sodium: 139 mmol/L (ref 135–145)

## 2024-02-11 LAB — CBC
HCT: 44.7 % (ref 39.0–52.0)
Hemoglobin: 14.3 g/dL (ref 13.0–17.0)
MCH: 30.1 pg (ref 26.0–34.0)
MCHC: 32 g/dL (ref 30.0–36.0)
MCV: 94.1 fL (ref 80.0–100.0)
Platelets: 203 10*3/uL (ref 150–400)
RBC: 4.75 MIL/uL (ref 4.22–5.81)
RDW: 12.8 % (ref 11.5–15.5)
WBC: 5.9 10*3/uL (ref 4.0–10.5)
nRBC: 0 % (ref 0.0–0.2)

## 2024-02-11 NOTE — Telephone Encounter (Signed)
 Left message to call back

## 2024-02-11 NOTE — Telephone Encounter (Signed)
 Transferred as a stat call,   Patient states that his heart rate is quite elevated. His heart rate has been in the 140s for at least the last day. He has not been checking it lately. He rode stationary bike at gym and it was the same throughout his workout and is still elevated. He did get a possible a fib reading from his [place] mobile. No app openings, but availability at A. Fib clinic, checking to see if patient can be seen today. Patient agreeable to being seen today and get to appointment at 3 if available. Regis Bill, LPN taking over call to get pt BP for a. Fib clinic.

## 2024-02-11 NOTE — Telephone Encounter (Signed)
 Spoke with Afib clinic and they can see patient today at 3 Advised patient of date, time and place

## 2024-02-11 NOTE — Progress Notes (Signed)
 Primary Care Physician: Corwin Levins, MD Primary Cardiologist: Dr Duke Salvia Primary Electrophysiologist: Dr Elberta Fortis  Referring Physician: [facility]   [name] is a 73 y.o. male with a history of hypertension, hyperlipidemia, impaired glucose tolerance, asthma, DVT following knee replacement, CHF with improved EF, atrial flutter who presents for follow up in the [facility]. The patient was initially diagnosed with atrial flutter 12/22/20 after presenting to the ED with symptoms of progressive SOB and elevated heart rates. ECG showed rapid atrial flutter. He was started on Eliquis for stroke prevention and metoprolol for rate control. TEE/DCCV 12/2020.  Echo LVEF less than 20%, global hypokinesis, secundum ASD.  Repeat echo revealed LVEF improved to 50%. Coronary CTA 05/2021 minimal noncalcified plaque as well as aortic atherosclerosis. Echocardiogram 01/03/2022 with normal LVEF 55 to 60%. Seen 08/26/22 in atrial flutter. 09/04/22 underwent TEE/DCCV . He was found to be back in atrial flutter at his follow up on 12/23/22. His BB was increased at that time. Underwent DCCV on 02/05/23  Patient returns for follow up for atrial flutter. He reports that yesterday at the [facility] he noticed his HR was ~140 bpm when he got on the stationary bike. After his workout, he went home and checked his [employer] mobile which showed "unclassified" but HR was still ~ 140 bpm. He is asymptomatic. There were no specific triggers that he could identify.   Today, he denies symptoms of palpitations, chest pain, shortness of breath, orthopnea, PND, lower extremity edema, dizziness, presyncope, syncope, snoring, daytime somnolence, bleeding, or neurologic sequela. The patient is tolerating medications without difficulties and is otherwise without complaint today.    Atrial Fibrillation Risk Factors:  he does not have symptoms or diagnosis of sleep apnea. Negative sleep study. he does not have a  history of rheumatic fever. he does not have a history of alcohol use. The patient does not have a history of early familial atrial fibrillation or other arrhythmias.   Atrial Fibrillation Management history:  Previous antiarrhythmic drugs: none Previous cardioversions: 12/2020, 09/04/22, 02/05/23 Previous ablations: none Anticoagulation history: Eliquis   Past Medical History:  Diagnosis Date   ALLERGIC RHINITIS 12/31/2007   Aortic atherosclerosis (HCC) 08/26/2022   Arthritis    Blood transfusion without reported diagnosis    Chronic combined systolic and diastolic heart failure (HCC) 06/14/2021   COUGH, CHRONIC 07/18/2007   DEGENERATIVE JOINT DISEASE 07/18/2007   Extrinsic asthma, unspecified 09/08/2009   GERD 07/18/2007   HYPERLIPIDEMIA 08/14/2010   Hypertension    Impaired glucose tolerance 09/23/2011   OBESITY 07/18/2007   Pneumonia     Current Outpatient Medications  Medication Sig Dispense Refill   acetaminophen (TYLENOL) 650 MG CR tablet Take 650 mg by mouth daily.     albuterol (PROAIR HFA) 108 (90 Base) MCG/ACT inhaler Inhale 2 puffs into the lungs every 6 (six) hours as needed for wheezing or shortness of breath. 24 g 3   budesonide-formoterol (SYMBICORT) 80-4.5 MCG/ACT inhaler Inhale 2 puffs into the lungs 2 (two) times daily. (Patient taking differently: Inhale 2 puffs into the lungs 2 (two) times daily as needed (Wheezing / SOB).) 3 each 3   cyanocobalamin (VITAMIN B12) 1000 MCG tablet Take 1,000 mcg by mouth every other day.     diclofenac Sodium (VOLTAREN) 1 % GEL Apply 1 Application topically 4 (four) times daily as needed (pain).     ELIQUIS 5 MG TABS tablet TAKE 1 TABLET BY MOUTH TWICE A DAY 180 tablet 1  esomeprazole (NEXIUM) 40 MG capsule Take 1 capsule (40 mg total) by mouth daily at 12 noon. Take 1 Daily 90 capsule 3   gabapentin (NEURONTIN) 300 MG capsule Take 1 capsule (300 mg total) by mouth at bedtime. 1 capsule at bedtime 180 capsule 3   Melatonin 10  MG TABS Take 5 mg by mouth at bedtime as needed (sleep).     metoprolol succinate (TOPROL-XL) 25 MG 24 hr tablet TAKE 1.5 TABLETS BY MOUTH DAILY. 135 tablet 3   Multiple Vitamins-Minerals (CENTRUM ADULTS PO) Take 1 tablet by mouth 3 (three) times a week.     oxymetazoline (AFRIN) 0.05 % nasal spray Place 1 spray into both nostrils 2 (two) times daily as needed for congestion.     rosuvastatin (CRESTOR) 20 MG tablet TAKE 1 TABLET BY MOUTH EVERY DAY IN THE EVENING 90 tablet 2   No current facility-administered medications for this encounter.    ROS- All systems are reviewed and negative except as per the HPI above.  Physical Exam: Vitals:   02/11/24 1459  BP: 90/80  Pulse: (!) 141  Weight: 108.9 kg  Height: 6' (1.829 m)     GEN: Well nourished, well developed in no acute distress CARDIAC: Regular rate and rhythm, tachycardia, no murmurs, rubs, gallops RESPIRATORY:  Clear to auscultation without rales, wheezing or rhonchi  ABDOMEN: Soft, non-tender, non-distended EXTREMITIES:  No edema; No deformity    Wt Readings from Last 3 Encounters:  02/11/24 108.9 kg  11/02/23 106.1 kg  10/14/23 106.6 kg    EKG today demonstrates  Atypical atrial flutter with 2:1 block, LAFB Vent. rate 141 BPM PR interval * ms QRS duration 88 ms QT/QTcB 326/499 ms   Echo 01/07/21  1. Left ventricular ejection fraction, by estimation, is 55 to 60%. The  left ventricle has normal function. The left ventricle has no regional  wall motion abnormalities. There is mild concentric left ventricular  hypertrophy of the septal segment. Left ventricular diastolic parameters are consistent with Grade I diastolic dysfunction (impaired relaxation). The average left ventricular global longitudinal strain is 19.8 %. The global longitudinal strain is normal.   2. Right ventricular systolic function is normal. The right ventricular  size is normal. There is normal pulmonary artery systolic pressure.   3. Left atrial  size was mildly dilated.   4. Right atrial size was mildly dilated.   5. The mitral valve is normal in structure. No evidence of mitral valve  regurgitation. No evidence of mitral stenosis.   6. The aortic valve is tricuspid. Aortic valve regurgitation is trivial.  No aortic stenosis is present.   7. Aortic dilatation noted. There is moderate dilatation of the aortic  root, measuring 46 mm. There is moderate dilatation of the ascending  aorta, measuring 44 mm.   8. The inferior vena cava is normal in size with greater than 50%  respiratory variability, suggesting right atrial pressure of 3 mmHg.    Epic records are reviewed at length today  CHA2DS2-VASc Score = 4  The patient's score is based upon: CHF History: 1 HTN History: 1 Diabetes History: 0 Stroke History: 0 Vascular Disease History: 1 Age Score: 1 Gender Score: 0       ASSESSMENT AND PLAN: Typical and atypical atrial flutter The patient's CHA2DS2-VASc score is 4, indicating a 4.8% annual risk of stroke.   Patient back in atrial flutter with elevated rates. We discussed rhythm control options will arrange for TEE guided DCCV (he missed a dose of  Eliquis last week). If he has quick return of atrial flutter, he would consider ablation with Dr Elberta Fortis.  Check bmet/cbc today.  Continue Eliquis 5 mg BID Continue Toprol 37.5 mg daily Kardia mobile for home monitoring.   Secondary Hypercoagulable State (ICD10:  D68.69) The patient is at significant risk for stroke/thromboembolism based upon his CHA2DS2-VASc Score of 4.  Continue Apixaban (Eliquis). No bleeding issues.  HFrecEF EF 60-65% on TEE, recovered with SR.  Fluid status appears stable today  HTN Low today, asymptomatic No room to up titrate BB    Follow up in the AF clinic post DCCV.    Informed Consent   Shared Decision Making/Informed Consent   The risks [stroke, cardiac arrhythmias rarely resulting in the need for a temporary or permanent pacemaker,  skin irritation or burns, esophageal damage, perforation (1:10,000 risk), bleeding, pharyngeal hematoma as well as other potential complications associated with conscious sedation including aspiration, arrhythmia, respiratory failure and death], benefits (treatment guidance, restoration of normal sinus rhythm, diagnostic support) and alternatives of a transesophageal echocardiogram guided cardioversion were discussed in detail with Mr. Harner and he is willing to proceed.      Jorja Loa PA-C [facility] [address] [place], [place] [address] [phone] 02/11/2024 3:24 PM

## 2024-02-11 NOTE — Telephone Encounter (Signed)
 Patient was calling to talk with Dr. Duke Salvia or nurse

## 2024-02-11 NOTE — Telephone Encounter (Signed)
  STAT if HR is under 50 or over 120 (normal HR is 60-100 beats per minute)  What is your heart rate? 143 - while on the phone   Do you have a log of your heart rate readings (document readings)? 141, 145, 143   Do you have any other symptoms?  No   Pt states he noticed his HR went up to 141-145 while he was riding the bicycle at the [facility] today. He states he uses a [place] mobile that will show "afib" or "unclassified". He denies any SOB, CP, lightheaded, or any other symptoms. Please advise.

## 2024-02-11 NOTE — Patient Instructions (Addendum)
 Cardioversion scheduled for: Monday, March 17th    - Arrive at the [facility] and go to admitting at [facility] not eat or drink anything after midnight the night prior to your procedure.   - Take all your morning medication (except diabetic medications) with a sip of water prior to arrival.  - You will not be able to drive home after your procedure.    - Do NOT miss any doses of your blood thinner - if you should miss a dose please notify our office immediately.   - If you feel as if you go back into normal rhythm prior to scheduled cardioversion, please notify our office immediately.   If your procedure is canceled in the cardioversion suite you will be charged a cancellation fee.

## 2024-02-12 NOTE — Progress Notes (Signed)
 Attempted to reach patient and left generic VM for return call as there was no identifying message with the VM.   Also left generic VM for wife, Darl Pikes who had not identifiers on her VM

## 2024-02-12 NOTE — Telephone Encounter (Signed)
 Patient is returning call, as well as have questions about his procedure on Monday. Please advise

## 2024-02-12 NOTE — Telephone Encounter (Signed)
 Returned call to patient,   Patient wanted to know who he should call if he goes back into normal rhythm over the weekend. Advised him to call after hours lines for directions.

## 2024-02-15 ENCOUNTER — Ambulatory Visit (HOSPITAL_COMMUNITY)
Admission: RE | Admit: 2024-02-15 | Discharge: 2024-02-15 | Disposition: A | Discharge status: Home / Self Care | Source: Ambulatory Visit | Attending: Internal Medicine | Admitting: Internal Medicine

## 2024-02-15 ENCOUNTER — Ambulatory Visit (HOSPITAL_COMMUNITY)

## 2024-02-15 ENCOUNTER — Encounter (HOSPITAL_COMMUNITY)
Admission: RE | Disposition: A | Discharge status: Home / Self Care | Payer: Self-pay | Source: Home / Self Care | Attending: Internal Medicine

## 2024-02-15 ENCOUNTER — Ambulatory Visit (HOSPITAL_COMMUNITY): Admitting: Anesthesiology

## 2024-02-15 ENCOUNTER — Ambulatory Visit (HOSPITAL_COMMUNITY)
Admission: RE | Admit: 2024-02-15 | Discharge: 2024-02-15 | Disposition: A | Discharge status: Home / Self Care | Attending: Internal Medicine | Admitting: Internal Medicine

## 2024-02-15 ENCOUNTER — Other Ambulatory Visit: Payer: Self-pay

## 2024-02-15 ENCOUNTER — Encounter (HOSPITAL_COMMUNITY): Payer: Self-pay | Admitting: Internal Medicine

## 2024-02-15 DIAGNOSIS — I129 Hypertensive chronic kidney disease with stage 1 through stage 4 chronic kidney disease, or unspecified chronic kidney disease: Secondary | ICD-10-CM | POA: Insufficient documentation

## 2024-02-15 DIAGNOSIS — I130 Hypertensive heart and chronic kidney disease with heart failure and stage 1 through stage 4 chronic kidney disease, or unspecified chronic kidney disease: Secondary | ICD-10-CM

## 2024-02-15 DIAGNOSIS — I4819 Other persistent atrial fibrillation: Secondary | ICD-10-CM

## 2024-02-15 DIAGNOSIS — E785 Hyperlipidemia, unspecified: Secondary | ICD-10-CM | POA: Insufficient documentation

## 2024-02-15 DIAGNOSIS — I4892 Unspecified atrial flutter: Secondary | ICD-10-CM | POA: Diagnosis not present

## 2024-02-15 DIAGNOSIS — N183 Chronic kidney disease, stage 3 unspecified: Secondary | ICD-10-CM

## 2024-02-15 DIAGNOSIS — I5042 Chronic combined systolic (congestive) and diastolic (congestive) heart failure: Secondary | ICD-10-CM

## 2024-02-15 DIAGNOSIS — N189 Chronic kidney disease, unspecified: Secondary | ICD-10-CM | POA: Insufficient documentation

## 2024-02-15 DIAGNOSIS — Q2112 Patent foramen ovale: Secondary | ICD-10-CM | POA: Insufficient documentation

## 2024-02-15 DIAGNOSIS — I4891 Unspecified atrial fibrillation: Secondary | ICD-10-CM

## 2024-02-15 LAB — ECHO TEE

## 2024-02-15 SURGERY — TRANSESOPHAGEAL ECHOCARDIOGRAM (TEE) (CATHLAB)
Anesthesia: Monitor Anesthesia Care

## 2024-02-15 MED ORDER — PROPOFOL 10 MG/ML IV BOLUS
INTRAVENOUS | Status: DC | PRN
  Administered 2024-02-15: 20 mg via INTRAVENOUS
  Administered 2024-02-15: 80 mg via INTRAVENOUS
  Administered 2024-02-15: 30 mg via INTRAVENOUS

## 2024-02-15 MED ORDER — SODIUM CHLORIDE 0.9% FLUSH: 3.0000 mL | INTRAVENOUS | Status: DC | PRN

## 2024-02-15 MED ORDER — SODIUM CHLORIDE 0.9% FLUSH: 3.0000 mL | Freq: Two times a day (BID) | INTRAVENOUS | Status: DC

## 2024-02-15 MED ORDER — ONDANSETRON HCL 4 MG/2ML IJ SOLN: 4.0000 mg | Freq: Four times a day (QID) | INTRAMUSCULAR | Status: DC | PRN

## 2024-02-15 MED ORDER — SODIUM CHLORIDE 0.9 % IV SOLN
INTRAVENOUS | Status: DC | PRN
Start: 2024-02-15 — End: 2024-02-15

## 2024-02-15 MED ORDER — FENTANYL CITRATE (PF) 100 MCG/2ML IJ SOLN: 25.0000 ug | INTRAMUSCULAR | Status: DC | PRN

## 2024-02-15 MED ORDER — OXYCODONE HCL 5 MG PO TABS: 5.0000 mg | ORAL_TABLET | Freq: Once | ORAL | Status: DC | PRN

## 2024-02-15 MED ORDER — OXYCODONE HCL 5 MG/5ML PO SOLN: 5.0000 mg | Freq: Once | ORAL | Status: DC | PRN

## 2024-02-15 MED ORDER — PHENYLEPHRINE 80 MCG/ML (10ML) SYRINGE FOR IV PUSH (FOR BLOOD PRESSURE SUPPORT)
PREFILLED_SYRINGE | INTRAVENOUS | Status: DC | PRN
  Administered 2024-02-15: 160 ug via INTRAVENOUS

## 2024-02-15 SURGICAL SUPPLY — 1 items: PAD DEFIB RADIO PHYSIO CONN (PAD) ×1 IMPLANT

## 2024-02-15 NOTE — Anesthesia Postprocedure Evaluation (Signed)
 Anesthesia Post Note  Patient: [name]  Procedure(s) Performed: TRANSESOPHAGEAL ECHOCARDIOGRAM CARDIOVERSION     Patient location during evaluation: Cath Lab Anesthesia Type: MAC Level of consciousness: awake and alert Pain management: pain level controlled Vital Signs Assessment: post-procedure vital signs reviewed and stable Respiratory status: spontaneous breathing, nonlabored ventilation, respiratory function stable and patient connected to nasal cannula oxygen Cardiovascular status: stable and blood pressure returned to baseline Postop Assessment: no apparent nausea or vomiting Anesthetic complications: no   No notable events documented.  Last Vitals:  Vitals:   02/15/24 1110 02/15/24 1120  BP: 97/73 102/77  Pulse: 65 61  Resp: 17 16  Temp:    SpO2: 95% 97%    Last Pain:  Vitals:   02/15/24 1050  TempSrc:   PainSc: 0-No pain                 Emer Onnen S

## 2024-02-15 NOTE — Progress Notes (Signed)
  Echocardiogram Echocardiogram Transesophageal has been performed.  [name] 02/15/2024, 11:30 AM

## 2024-02-15 NOTE — CV Procedure (Signed)
 Procedure: Electrical Cardioversion Indications:  Atrial Flutter  Procedure Details:  Consent: Risks of procedure as well as the alternatives and risks of each were explained to the (patient/caregiver).  Consent for procedure obtained.  Time Out: Verified patient identification, verified procedure, site/side was marked, verified correct patient position, special equipment/implants available, medications/allergies/relevent history reviewed, required imaging and test results available. PERFORMED.  Patient placed on cardiac monitor, pulse oximetry, supplemental oxygen as necessary.  Sedation given:  propofol Pacer pads placed anterior and posterior chest.  Cardioverted 1 time(s).  Cardioversion with synchronized biphasic 200J shock.  Evaluation: Findings: Post procedure EKG shows: NSR Complications: None Patient did tolerate procedure well.  Time Spent Directly with the Patient:  15 minutes   Maisie Fus 02/15/2024, 10:45 AM  INDICATIONS: Atrial Flutter  PROCEDURE:   Informed consent was obtained prior to the procedure. The risks, benefits and alternatives for the procedure were discussed and the patient comprehended these risks.  Risks include, but are not limited to, cough, sore throat, vomiting, nausea, somnolence, esophageal and stomach trauma or perforation, bleeding, low blood pressure, aspiration, pneumonia, infection, trauma to the teeth and death.    After a procedural time-out, the oropharynx was anesthetized with 20% benzocaine spray.   During this procedure the patient was administered  anesthesia (see their note) to achieve and maintain moderate conscious sedation.  The patient's heart rate, blood pressure, and oxygen saturationweare monitored continuously during the procedure. The period of conscious sedation was 15 minutes, of which I was present face-to-face 100% of this time.  The transesophageal probe was inserted in the esophagus and stomach without difficulty  and multiple views were obtained.  The patient was kept under observation until the patient left the procedure room.  The patient left the procedure room in stable condition.   Agitated microbubble saline contrast was not administered.  COMPLICATIONS:    There were no immediate complications.  FINDINGS:  Normal LV/RV fxn No significant valve disease No LAA thrombus Small PFO  RECOMMENDATIONS:     Continue anticoagulation  Time Spent Directly with the Patient:  15 minutes   Maisie Fus 02/15/2024, 10:45 AM

## 2024-02-15 NOTE — Anesthesia Preprocedure Evaluation (Signed)
 Anesthesia Evaluation  Patient identified by MRN, date of birth, ID band Patient awake    Reviewed: Allergy & Precautions, H&P , NPO status , Patient's Chart, lab work & pertinent test results  Airway Mallampati: II   Neck ROM: full    Dental   Pulmonary shortness of breath, asthma    breath sounds clear to auscultation       Cardiovascular hypertension, + dysrhythmias Atrial Fibrillation  Rhythm:irregular Rate:Normal     Neuro/Psych    GI/Hepatic ,GERD  ,,  Endo/Other    Renal/GU      Musculoskeletal  (+) Arthritis ,    Abdominal   Peds  Hematology   Anesthesia Other Findings   Reproductive/Obstetrics                             Anesthesia Physical Anesthesia Plan  ASA: 3  Anesthesia Plan: MAC   Post-op Pain Management:    Induction: Intravenous  PONV Risk Score and Plan: 1 and Propofol infusion and Treatment may vary due to age or medical condition  Airway Management Planned: Nasal Cannula  Additional Equipment:   Intra-op Plan:   Post-operative Plan:   Informed Consent: I have reviewed the patients History and Physical, chart, labs and discussed the procedure including the risks, benefits and alternatives for the proposed anesthesia with the patient or authorized representative who has indicated his/her understanding and acceptance.     Dental advisory given  Plan Discussed with: CRNA, Anesthesiologist and Surgeon  Anesthesia Plan Comments:        Anesthesia Quick Evaluation

## 2024-02-15 NOTE — Discharge Instructions (Signed)
 TEE   YOU HAD AN CARDIAC PROCEDURE TODAY: Refer to the procedure report and other information in the discharge instructions given to you for any specific questions about what was found during the examination. If this information does not answer your questions, please call Dr. Verl Dicker office at [phone] to clarify.   DIET: Your first meal following the procedure should be a light meal and then it is ok to progress to your normal diet. A half-sandwich or bowl of soup is an example of a good first meal. Heavy or fried foods are harder to digest and may make you feel nauseous or bloated. Drink plenty of fluids but you should avoid alcoholic beverages for 24 hours. If you had a esophageal dilation, please see attached instructions for diet.   ACTIVITY: Your care partner should take you home directly after the procedure. You should plan to take it easy, moving slowly for the rest of the day. You can resume normal activity the day after the procedure however YOU SHOULD NOT DRIVE, use power tools, machinery or perform tasks that involve climbing or major physical exertion for 24 hours (because of the sedation medicines used during the test).   SYMPTOMS TO REPORT IMMEDIATELY: A cardiologist can be reached at any hour. Please call [phone] for any of the following symptoms:  Vomiting of blood or coffee ground material  New, significant abdominal pain  New, significant chest pain or pain under the shoulder blades  Painful or persistently difficult swallowing  New shortness of breath  Black, tarry-looking or red, bloody stools  FOLLOW UP:  Please also call with any specific questions about appointments or follow up tests.Electrical Cardioversion Electrical cardioversion is the delivery of a jolt of electricity to restore a normal rhythm to the heart. A rhythm that is too fast or is not regular keeps the heart from pumping well. In this procedure, sticky patches or metal paddles are placed on the  chest to deliver electricity to the heart from a device. This procedure may be done in an emergency if: There is low or no blood pressure as a result of the heart rhythm. Normal rhythm must be restored as fast as possible to protect the brain and heart from further damage. It may save a life. This may also be a scheduled procedure for irregular or fast heart rhythms that are not immediately life-threatening.  What can I expect after the procedure? Your blood pressure, heart rate, breathing rate, and blood oxygen level will be monitored until you leave the hospital or clinic. Your heart rhythm will be watched to make sure it does not change. You may have some redness on the skin where the shocks were given. Over the counter cortizone cream may be helpful.  Follow these instructions at home: Do not drive for 24 hours if you were given a sedative during your procedure. Take over-the-counter and prescription medicines only as told by your health care provider. Ask your health care provider how to check your pulse. Check it often. Rest for 48 hours after the procedure or as told by your health care provider. Avoid or limit your caffeine use as told by your health care provider. Keep all follow-up visits as told by your health care provider. This is important. Contact a health care provider if: You feel like your heart is beating too quickly or your pulse is not regular. You have a serious muscle cramp that does not go away. Get help right away if: You have discomfort in your  chest. You are dizzy or you feel faint. You have trouble breathing or you are short of breath. Your speech is slurred. You have trouble moving an arm or leg on one side of your body. Your fingers or toes turn cold or blue. Summary Electrical cardioversion is the delivery of a jolt of electricity to restore a normal rhythm to the heart. This procedure may be done right away in an emergency or may be a scheduled procedure  if the condition is not an emergency. Generally, this is a safe procedure. After the procedure, check your pulse often as told by your health care provider. This information is not intended to replace advice given to you by your health care provider. Make sure you discuss any questions you have with your health care provider. Document Revised: 06/20/2019 Document Reviewed: 06/20/2019 Elsevier Patient Education  2020 [employer].

## 2024-02-15 NOTE — Progress Notes (Signed)
 ECG shows  Vent. rate 81 BPM PR interval * ms QRS duration 90 ms QT/QTcB 414/480 ms P-R-T axes 98 -52 79 Atrial flutter with variable A-V block with premature ventricular or aberrantly conducted complexes Left anterior fascicular block Nonspecific ST and T wave abnormality Prolonged QT Abnormal ECG When compared with ECG of 11-Feb-2024 15:01, PREVIOUS ECG IS PRESENT   Kardiamobile strips (2) that state NSR still appear to show rate controlled atrial flutter. Proceed with cardioversion and f/u.

## 2024-02-15 NOTE — H&P (View-Only) (Signed)
 ECG shows  Vent. rate 81 BPM PR interval * ms QRS duration 90 ms QT/QTcB 414/480 ms P-R-T axes 98 -52 79 Atrial flutter with variable A-V block with premature ventricular or aberrantly conducted complexes Left anterior fascicular block Nonspecific ST and T wave abnormality Prolonged QT Abnormal ECG When compared with ECG of 11-Feb-2024 15:01, PREVIOUS ECG IS PRESENT   Kardiamobile strips (2) that state NSR still appear to show rate controlled atrial flutter. Proceed with cardioversion and f/u.

## 2024-02-15 NOTE — Interval H&P Note (Signed)
 History and Physical Interval Note:  02/15/2024 10:35 AM  [name]  has presented today for surgery, with the diagnosis of AFIB.  The various methods of treatment have been discussed with the patient and family. After consideration of risks, benefits and other options for treatment, the patient has consented to  Procedure(s): TRANSESOPHAGEAL ECHOCARDIOGRAM (N/A) CARDIOVERSION (N/A) as a surgical intervention.  The patient's history has been reviewed, patient examined, no change in status, stable for surgery.  I have reviewed the patient's chart and labs.  Questions were answered to the patient's satisfaction.     Maisie Fus

## 2024-02-15 NOTE — Transfer of Care (Signed)
 Immediate Anesthesia Transfer of Care Note  Patient: [name]  Procedure(s) Performed: TRANSESOPHAGEAL ECHOCARDIOGRAM CARDIOVERSION  Patient Location: PACU  Anesthesia Type:MAC  Level of Consciousness: drowsy  Airway & Oxygen Therapy: Patient Spontanous Breathing and Patient connected to nasal cannula oxygen  Post-op Assessment: Report given to RN and Post -op Vital signs reviewed and stable  Post vital signs: Reviewed and stable  Last Vitals:  Vitals Value Taken Time  BP 100/76 02/15/24 1050  Temp    Pulse 56 02/15/24 1051  Resp 15 02/15/24 1051  SpO2 95 % 02/15/24 1051  Vitals shown include unfiled device data.  Last Pain:  Vitals:   02/15/24 1017  TempSrc:   PainSc: 0-No pain         Complications: No notable events documented.

## 2024-02-20 ENCOUNTER — Other Ambulatory Visit (HOSPITAL_BASED_OUTPATIENT_CLINIC_OR_DEPARTMENT_OTHER): Payer: Self-pay | Admitting: Cardiovascular Disease

## 2024-02-23 ENCOUNTER — Encounter (HOSPITAL_COMMUNITY): Payer: Self-pay | Admitting: Physician Assistant

## 2024-02-23 ENCOUNTER — Ambulatory Visit (HOSPITAL_COMMUNITY)
Admission: RE | Admit: 2024-02-23 | Discharge: 2024-02-23 | Disposition: A | Discharge status: Home / Self Care | Source: Ambulatory Visit | Attending: Physician Assistant | Admitting: Physician Assistant

## 2024-02-23 VITALS — BP 116/84 | HR 71 | Ht 72.0 in | Wt 242.2 lb

## 2024-02-23 DIAGNOSIS — I110 Hypertensive heart disease with heart failure: Secondary | ICD-10-CM | POA: Insufficient documentation

## 2024-02-23 DIAGNOSIS — I5022 Chronic systolic (congestive) heart failure: Secondary | ICD-10-CM | POA: Insufficient documentation

## 2024-02-23 DIAGNOSIS — I77819 Aortic ectasia, unspecified site: Secondary | ICD-10-CM | POA: Diagnosis not present

## 2024-02-23 DIAGNOSIS — D6869 Other thrombophilia: Secondary | ICD-10-CM | POA: Diagnosis not present

## 2024-02-23 DIAGNOSIS — I440 Atrioventricular block, first degree: Secondary | ICD-10-CM | POA: Insufficient documentation

## 2024-02-23 DIAGNOSIS — I483 Typical atrial flutter: Secondary | ICD-10-CM | POA: Diagnosis not present

## 2024-02-23 DIAGNOSIS — E785 Hyperlipidemia, unspecified: Secondary | ICD-10-CM | POA: Diagnosis not present

## 2024-02-23 DIAGNOSIS — I484 Atypical atrial flutter: Secondary | ICD-10-CM | POA: Diagnosis not present

## 2024-02-23 DIAGNOSIS — Z79899 Other long term (current) drug therapy: Secondary | ICD-10-CM | POA: Insufficient documentation

## 2024-02-23 NOTE — Progress Notes (Signed)
 Primary Care Physician: Corwin Levins, MD Primary Cardiologist: Dr Duke Salvia Primary Electrophysiologist: Dr Elberta Fortis  Referring Physician: [facility]   [name] is a 73 y.o. male with a history of hypertension, hyperlipidemia, impaired glucose tolerance, asthma, DVT following knee replacement, CHF with improved EF, atrial flutter who presents for follow up in the [facility]. The patient was initially diagnosed with atrial flutter 12/22/20 after presenting to the ED with symptoms of progressive SOB and elevated heart rates. ECG showed rapid atrial flutter. He was started on Eliquis for stroke prevention and metoprolol for rate control. TEE/DCCV 12/2020.  Echo LVEF less than 20%, global hypokinesis, secundum ASD.  Repeat echo revealed LVEF improved to 50%. Coronary CTA 05/2021 minimal noncalcified plaque as well as aortic atherosclerosis. Echocardiogram 01/03/2022 with normal LVEF 55 to 60%. Seen 08/26/22 in atrial flutter. 09/04/22 underwent TEE/DCCV . He was found to be back in atrial flutter at his follow up on 12/23/22. His BB was increased at that time. Underwent DCCV on 02/05/23  He was in atrial flutter again at his visit 02/11/24, noted persistently elevated rates while exercising.   Patient returns for follow up for atrial flutter. He is s/p DCCV on 02/15/24. He remains in SR. No bleeding issues on anticoagulation.   Today, he denies symptoms of palpitations, chest pain, shortness of breath, orthopnea, PND, lower extremity edema, dizziness, presyncope, syncope, snoring, daytime somnolence, bleeding, or neurologic sequela. The patient is tolerating medications without difficulties and is otherwise without complaint today.    Atrial Fibrillation Risk Factors:  he does not have symptoms or diagnosis of sleep apnea. Negative sleep study. he does not have a history of rheumatic fever. he does not have a history of alcohol use. The patient does not have a history  of early familial atrial fibrillation or other arrhythmias.   Atrial Fibrillation Management history:  Previous antiarrhythmic drugs: none Previous cardioversions: 12/2020, 09/04/22, 02/05/23, 02/15/24 Previous ablations: none Anticoagulation history: Eliquis   Past Medical History:  Diagnosis Date   ALLERGIC RHINITIS 12/31/2007   Aortic atherosclerosis (HCC) 08/26/2022   Arthritis    Blood transfusion without reported diagnosis    Chronic combined systolic and diastolic heart failure (HCC) 06/14/2021   COUGH, CHRONIC 07/18/2007   DEGENERATIVE JOINT DISEASE 07/18/2007   Extrinsic asthma, unspecified 09/08/2009   GERD 07/18/2007   HYPERLIPIDEMIA 08/14/2010   Hypertension    Impaired glucose tolerance 09/23/2011   OBESITY 07/18/2007   Pneumonia     Current Outpatient Medications  Medication Sig Dispense Refill   acetaminophen (TYLENOL) 650 MG CR tablet Take 650 mg by mouth in the morning.     albuterol (PROAIR HFA) 108 (90 Base) MCG/ACT inhaler Inhale 2 puffs into the lungs every 6 (six) hours as needed for wheezing or shortness of breath. 24 g 3   budesonide-formoterol (SYMBICORT) 80-4.5 MCG/ACT inhaler Inhale 2 puffs into the lungs 2 (two) times daily. (Patient taking differently: Inhale 2 puffs into the lungs 2 (two) times daily as needed (Wheezing / SOB).) 3 each 3   cyanocobalamin (VITAMIN B12) 1000 MCG tablet Take 1,000 mcg by mouth every other day.     diclofenac Sodium (VOLTAREN) 1 % GEL Apply 1 Application topically 4 (four) times daily as needed (pain).     ELIQUIS 5 MG TABS tablet TAKE 1 TABLET BY MOUTH TWICE A DAY 180 tablet 1   esomeprazole (NEXIUM) 40 MG capsule Take 1 capsule (40 mg total) by mouth daily at 12 noon.  Take 1 Daily (Patient taking differently: Take 40 mg by mouth daily as needed (Heartburm).) 90 capsule 3   gabapentin (NEURONTIN) 300 MG capsule Take 1 capsule (300 mg total) by mouth at bedtime. 1 capsule at bedtime 180 capsule 3   Krill Oil 500 MG CAPS  Take 500 mg by mouth daily.     melatonin 5 MG TABS Take 5 mg by mouth at bedtime.     metoprolol succinate (TOPROL-XL) 25 MG 24 hr tablet TAKE 1.5 TABLETS BY MOUTH DAILY. 135 tablet 3   Multiple Vitamins-Minerals (CENTRUM ADULTS PO) Take 1 tablet by mouth 3 (three) times a week.     rosuvastatin (CRESTOR) 20 MG tablet TAKE 1 TABLET BY MOUTH EVERY DAY IN THE EVENING 90 tablet 2   No current facility-administered medications for this encounter.    ROS- All systems are reviewed and negative except as per the HPI above.  Physical Exam: Vitals:   02/23/24 1457  BP: 116/84  Pulse: 71  Weight: 109.9 kg  Height: 6' (1.829 m)    GEN: Well nourished, well developed in no acute distress CARDIAC: Regular rate and rhythm, no murmurs, rubs, gallops RESPIRATORY:  Clear to auscultation without rales, wheezing or rhonchi  ABDOMEN: Soft, non-tender, non-distended EXTREMITIES:  No edema; No deformity    Wt Readings from Last 3 Encounters:  02/23/24 109.9 kg  02/15/24 108 kg  02/11/24 108.9 kg    EKG today demonstrates  SR, 1st degree AV block, LAFB Vent. rate 71 BPM PR interval 218 ms QRS duration 90 ms QT/QTcB 394/428 ms   Echo 01/07/21  1. Left ventricular ejection fraction, by estimation, is 55 to 60%. The  left ventricle has normal function. The left ventricle has no regional  wall motion abnormalities. There is mild concentric left ventricular  hypertrophy of the septal segment. Left ventricular diastolic parameters are consistent with Grade I diastolic dysfunction (impaired relaxation). The average left ventricular global longitudinal strain is 19.8 %. The global longitudinal strain is normal.   2. Right ventricular systolic function is normal. The right ventricular  size is normal. There is normal pulmonary artery systolic pressure.   3. Left atrial size was mildly dilated.   4. Right atrial size was mildly dilated.   5. The mitral valve is normal in structure. No evidence of  mitral valve  regurgitation. No evidence of mitral stenosis.   6. The aortic valve is tricuspid. Aortic valve regurgitation is trivial.  No aortic stenosis is present.   7. Aortic dilatation noted. There is moderate dilatation of the aortic  root, measuring 46 mm. There is moderate dilatation of the ascending  aorta, measuring 44 mm.   8. The inferior vena cava is normal in size with greater than 50%  respiratory variability, suggesting right atrial pressure of 3 mmHg.    Epic records are reviewed at length today  CHA2DS2-VASc Score = 4  The patient's score is based upon: CHF History: 1 HTN History: 1 Diabetes History: 0 Stroke History: 0 Vascular Disease History: 1 Age Score: 1 Gender Score: 0       ASSESSMENT AND PLAN: Typical and atypical atrial flutter The patient's CHA2DS2-VASc score is 4, indicating a 4.8% annual risk of stroke.   S/p DCCV 02/15/24 Patient appears to be maintaining SR. If he has quick return of his flutter, he would consider ablation.  Continue Eliquis 5 mg BID Continue Toprol 37.5 mg daily Kardia mobile for home monitoring  Secondary Hypercoagulable State (ICD10:  D68.69) The  patient is at significant risk for stroke/thromboembolism based upon his CHA2DS2-VASc Score of 4.  Continue Apixaban (Eliquis). No bleeding issues.   HFrecEF EF 60-65% on TEE, recovered with SR Fluid status appears stable today  HTN Stable on current regimen    Follow up in the AF clinic in 6 months.     Jorja Loa PA-C [facility] [address] [place], [place] [address] [phone] 02/23/2024 3:01 PM

## 2024-03-03 ENCOUNTER — Encounter (HOSPITAL_COMMUNITY): Payer: Self-pay | Admitting: Physician Assistant

## 2024-03-03 ENCOUNTER — Ambulatory Visit (HOSPITAL_COMMUNITY)
Admission: RE | Admit: 2024-03-03 | Discharge: 2024-03-03 | Disposition: A | Discharge status: Home / Self Care | Source: Ambulatory Visit | Attending: Physician Assistant | Admitting: Physician Assistant

## 2024-03-03 VITALS — BP 140/100 | HR 73 | Ht 72.0 in | Wt 243.0 lb

## 2024-03-03 DIAGNOSIS — E785 Hyperlipidemia, unspecified: Secondary | ICD-10-CM | POA: Diagnosis not present

## 2024-03-03 DIAGNOSIS — I5030 Unspecified diastolic (congestive) heart failure: Secondary | ICD-10-CM | POA: Diagnosis not present

## 2024-03-03 DIAGNOSIS — I483 Typical atrial flutter: Secondary | ICD-10-CM | POA: Diagnosis not present

## 2024-03-03 DIAGNOSIS — I110 Hypertensive heart disease with heart failure: Secondary | ICD-10-CM | POA: Diagnosis not present

## 2024-03-03 DIAGNOSIS — I484 Atypical atrial flutter: Secondary | ICD-10-CM | POA: Insufficient documentation

## 2024-03-03 DIAGNOSIS — J45909 Unspecified asthma, uncomplicated: Secondary | ICD-10-CM | POA: Diagnosis not present

## 2024-03-03 DIAGNOSIS — D6869 Other thrombophilia: Secondary | ICD-10-CM | POA: Diagnosis not present

## 2024-03-03 DIAGNOSIS — Z86718 Personal history of other venous thrombosis and embolism: Secondary | ICD-10-CM | POA: Diagnosis not present

## 2024-03-03 DIAGNOSIS — Z79899 Other long term (current) drug therapy: Secondary | ICD-10-CM | POA: Diagnosis not present

## 2024-03-03 DIAGNOSIS — Z7901 Long term (current) use of anticoagulants: Secondary | ICD-10-CM | POA: Diagnosis not present

## 2024-03-03 MED ORDER — FLECAINIDE ACETATE 50 MG PO TABS: 50.0000 mg | ORAL_TABLET | Freq: Two times a day (BID) | ORAL | 3 refills | Status: DC

## 2024-03-03 NOTE — H&P (View-Only) (Signed)
 Primary Care Physician: Corwin Levins, MD Primary Cardiologist: Dr Duke Salvia Primary Electrophysiologist: Dr Elberta Fortis  Referring Physician: [facility]   [name] is a 73 y.o. male with a history of hypertension, hyperlipidemia, impaired glucose tolerance, asthma, DVT following knee replacement, CHF with improved EF, atrial flutter who presents for follow up in the [facility]. The patient was initially diagnosed with atrial flutter 12/22/20 after presenting to the ED with symptoms of progressive SOB and elevated heart rates. ECG showed rapid atrial flutter. He was started on Eliquis for stroke prevention and metoprolol for rate control. TEE/DCCV 12/2020.  Echo LVEF less than 20%, global hypokinesis, secundum ASD.  Repeat echo revealed LVEF improved to 50%. Coronary CTA 05/2021 minimal noncalcified plaque as well as aortic atherosclerosis. Echocardiogram 01/03/2022 with normal LVEF 55 to 60%. Seen 08/26/22 in atrial flutter. 09/04/22 underwent TEE/DCCV . He was found to be back in atrial flutter at his follow up on 12/23/22. His BB was increased at that time. Underwent DCCV on 02/05/23  He was in atrial flutter again at his visit 02/11/24, noted persistently elevated rates while exercising. He is s/p DCCV on 02/15/24.   Patient returns for follow up for atrial flutter. Unfortunately, patient is back in atrial flutter with mostly elevated heart rates in the 140's. He is asymptomatic. There were no specific triggers that he could identify. No bleeding issues on anticoagulation.   Today, he  denies symptoms of palpitations, chest pain, shortness of breath, orthopnea, PND, lower extremity edema, dizziness, presyncope, syncope, snoring, daytime somnolence, bleeding, or neurologic sequela. The patient is tolerating medications without difficulties and is otherwise without complaint today.    Atrial Fibrillation Risk Factors:  he does not have symptoms or diagnosis of sleep  apnea. Negative sleep study. he does not have a history of rheumatic fever. he does not have a history of alcohol use. The patient does not have a history of early familial atrial fibrillation or other arrhythmias.   Atrial Fibrillation Management history:  Previous antiarrhythmic drugs: none Previous cardioversions: 12/2020, 09/04/22, 02/05/23, 02/15/24 Previous ablations: none Anticoagulation history: Eliquis   Past Medical History:  Diagnosis Date   ALLERGIC RHINITIS 12/31/2007   Aortic atherosclerosis (HCC) 08/26/2022   Arthritis    Blood transfusion without reported diagnosis    Chronic combined systolic and diastolic heart failure (HCC) 06/14/2021   COUGH, CHRONIC 07/18/2007   DEGENERATIVE JOINT DISEASE 07/18/2007   Extrinsic asthma, unspecified 09/08/2009   GERD 07/18/2007   HYPERLIPIDEMIA 08/14/2010   Hypertension    Impaired glucose tolerance 09/23/2011   OBESITY 07/18/2007   Pneumonia     Current Outpatient Medications  Medication Sig Dispense Refill   acetaminophen (TYLENOL) 650 MG CR tablet Take 650 mg by mouth in the morning.     albuterol (PROAIR HFA) 108 (90 Base) MCG/ACT inhaler Inhale 2 puffs into the lungs every 6 (six) hours as needed for wheezing or shortness of breath. 24 g 3   budesonide-formoterol (SYMBICORT) 80-4.5 MCG/ACT inhaler Inhale 2 puffs into the lungs 2 (two) times daily. (Patient taking differently: Inhale 2 puffs into the lungs 2 (two) times daily as needed (Wheezing / SOB).) 3 each 3   cyanocobalamin (VITAMIN B12) 1000 MCG tablet Take 1,000 mcg by mouth every other day.     diclofenac Sodium (VOLTAREN) 1 % GEL Apply 1 Application topically 4 (four) times daily as needed (pain).     ELIQUIS 5 MG TABS tablet TAKE 1 TABLET BY MOUTH TWICE  A DAY 180 tablet 1   esomeprazole (NEXIUM) 40 MG capsule Take 1 capsule (40 mg total) by mouth daily at 12 noon. Take 1 Daily (Patient taking differently: Take 40 mg by mouth daily as needed (Heartburm).) 90  capsule 3   gabapentin (NEURONTIN) 300 MG capsule Take 1 capsule (300 mg total) by mouth at bedtime. 1 capsule at bedtime 180 capsule 3   Krill Oil 500 MG CAPS Take 500 mg by mouth daily.     melatonin 5 MG TABS Take 5 mg by mouth at bedtime.     metoprolol succinate (TOPROL-XL) 25 MG 24 hr tablet TAKE 1.5 TABLETS BY MOUTH DAILY. 135 tablet 3   Multiple Vitamins-Minerals (CENTRUM ADULTS PO) Take 1 tablet by mouth 3 (three) times a week.     rosuvastatin (CRESTOR) 20 MG tablet TAKE 1 TABLET BY MOUTH EVERY DAY IN THE EVENING 90 tablet 2   No current facility-administered medications for this encounter.    ROS- All systems are reviewed and negative except as per the HPI above.  Physical Exam: Vitals:   03/03/24 0927  BP: (!) 140/100  Pulse: 73  Weight: 110.2 kg  Height: 6' (1.829 m)    GEN: Well nourished, well developed in no acute distress CARDIAC: Regular rate and rhythm, no murmurs, rubs, gallops RESPIRATORY:  Clear to auscultation without rales, wheezing or rhonchi  ABDOMEN: Soft, non-tender, non-distended EXTREMITIES:  No edema; No deformity    Wt Readings from Last 3 Encounters:  03/03/24 110.2 kg  02/23/24 109.9 kg  02/15/24 108 kg    EKG today demonstrates  Typical atrial flutter with predominantly 4:1 block Vent. rate 73 BPM PR interval * ms QRS duration 82 ms QT/QTcB 414/456 ms   Echo 01/07/21  1. Left ventricular ejection fraction, by estimation, is 55 to 60%. The  left ventricle has normal function. The left ventricle has no regional  wall motion abnormalities. There is mild concentric left ventricular  hypertrophy of the septal segment. Left ventricular diastolic parameters are consistent with Grade I diastolic dysfunction (impaired relaxation). The average left ventricular global longitudinal strain is 19.8 %. The global longitudinal strain is normal.   2. Right ventricular systolic function is normal. The right ventricular  size is normal. There is normal  pulmonary artery systolic pressure.   3. Left atrial size was mildly dilated.   4. Right atrial size was mildly dilated.   5. The mitral valve is normal in structure. No evidence of mitral valve  regurgitation. No evidence of mitral stenosis.   6. The aortic valve is tricuspid. Aortic valve regurgitation is trivial.  No aortic stenosis is present.   7. Aortic dilatation noted. There is moderate dilatation of the aortic  root, measuring 46 mm. There is moderate dilatation of the ascending  aorta, measuring 44 mm.   8. The inferior vena cava is normal in size with greater than 50%  respiratory variability, suggesting right atrial pressure of 3 mmHg.    Epic records are reviewed at length today  CHA2DS2-VASc Score = 4  The patient's score is based upon: CHF History: 1 HTN History: 1 Diabetes History: 0 Stroke History: 0 Vascular Disease History: 1 Age Score: 1 Gender Score: 0       ASSESSMENT AND PLAN: Typical and atypical atrial flutter The patient's CHA2DS2-VASc score is 4, indicating a 4.8% annual risk of stroke.   S/p DCCV 02/15/24 with quick return of flutter We discussed rhythm control options today. After discussing the risks and  benefits, will start flecainide 50 mg BID. If he does not chemically convert, will increase dose and arrange for DCCV. CAC score 0 in 2022. Patient also agreeable to consultation with Dr Elberta Fortis for ablation.  Continue Eliquis 5 mg BID Continue Toprol 37.5 mg daily Kardia mobile for home monitoring.   Secondary Hypercoagulable State (ICD10:  D68.69) The patient is at significant risk for stroke/thromboembolism based upon his CHA2DS2-VASc Score of 4.  Continue Apixaban (Eliquis). No bleeding issues.   HFrecEF EF 60-65% on TEE, normalized with SR Fluid status appears stable today  HTN Diastolic elevated today, will reassess in SR.    Follow up in the AF clinic next week for ECG.     Jorja Loa PA-C Af[facility] Clinic [facility] [address] [place], [place] [address] [phone] 03/03/2024 9:37 AM

## 2024-03-03 NOTE — Progress Notes (Signed)
 Primary Care Physician: Corwin Levins, MD Primary Cardiologist: Dr Duke Salvia Primary Electrophysiologist: Dr Elberta Fortis  Referring Physician: [facility]   [name] is a 73 y.o. male with a history of hypertension, hyperlipidemia, impaired glucose tolerance, asthma, DVT following knee replacement, CHF with improved EF, atrial flutter who presents for follow up in the [facility]. The patient was initially diagnosed with atrial flutter 12/22/20 after presenting to the ED with symptoms of progressive SOB and elevated heart rates. ECG showed rapid atrial flutter. He was started on Eliquis for stroke prevention and metoprolol for rate control. TEE/DCCV 12/2020.  Echo LVEF less than 20%, global hypokinesis, secundum ASD.  Repeat echo revealed LVEF improved to 50%. Coronary CTA 05/2021 minimal noncalcified plaque as well as aortic atherosclerosis. Echocardiogram 01/03/2022 with normal LVEF 55 to 60%. Seen 08/26/22 in atrial flutter. 09/04/22 underwent TEE/DCCV . He was found to be back in atrial flutter at his follow up on 12/23/22. His BB was increased at that time. Underwent DCCV on 02/05/23  He was in atrial flutter again at his visit 02/11/24, noted persistently elevated rates while exercising. He is s/p DCCV on 02/15/24.   Patient returns for follow up for atrial flutter. Unfortunately, patient is back in atrial flutter with mostly elevated heart rates in the 140's. He is asymptomatic. There were no specific triggers that he could identify. No bleeding issues on anticoagulation.   Today, he  denies symptoms of palpitations, chest pain, shortness of breath, orthopnea, PND, lower extremity edema, dizziness, presyncope, syncope, snoring, daytime somnolence, bleeding, or neurologic sequela. The patient is tolerating medications without difficulties and is otherwise without complaint today.    Atrial Fibrillation Risk Factors:  he does not have symptoms or diagnosis of sleep  apnea. Negative sleep study. he does not have a history of rheumatic fever. he does not have a history of alcohol use. The patient does not have a history of early familial atrial fibrillation or other arrhythmias.   Atrial Fibrillation Management history:  Previous antiarrhythmic drugs: none Previous cardioversions: 12/2020, 09/04/22, 02/05/23, 02/15/24 Previous ablations: none Anticoagulation history: Eliquis   Past Medical History:  Diagnosis Date   ALLERGIC RHINITIS 12/31/2007   Aortic atherosclerosis (HCC) 08/26/2022   Arthritis    Blood transfusion without reported diagnosis    Chronic combined systolic and diastolic heart failure (HCC) 06/14/2021   COUGH, CHRONIC 07/18/2007   DEGENERATIVE JOINT DISEASE 07/18/2007   Extrinsic asthma, unspecified 09/08/2009   GERD 07/18/2007   HYPERLIPIDEMIA 08/14/2010   Hypertension    Impaired glucose tolerance 09/23/2011   OBESITY 07/18/2007   Pneumonia     Current Outpatient Medications  Medication Sig Dispense Refill   acetaminophen (TYLENOL) 650 MG CR tablet Take 650 mg by mouth in the morning.     albuterol (PROAIR HFA) 108 (90 Base) MCG/ACT inhaler Inhale 2 puffs into the lungs every 6 (six) hours as needed for wheezing or shortness of breath. 24 g 3   budesonide-formoterol (SYMBICORT) 80-4.5 MCG/ACT inhaler Inhale 2 puffs into the lungs 2 (two) times daily. (Patient taking differently: Inhale 2 puffs into the lungs 2 (two) times daily as needed (Wheezing / SOB).) 3 each 3   cyanocobalamin (VITAMIN B12) 1000 MCG tablet Take 1,000 mcg by mouth every other day.     diclofenac Sodium (VOLTAREN) 1 % GEL Apply 1 Application topically 4 (four) times daily as needed (pain).     ELIQUIS 5 MG TABS tablet TAKE 1 TABLET BY MOUTH TWICE  A DAY 180 tablet 1   esomeprazole (NEXIUM) 40 MG capsule Take 1 capsule (40 mg total) by mouth daily at 12 noon. Take 1 Daily (Patient taking differently: Take 40 mg by mouth daily as needed (Heartburm).) 90  capsule 3   gabapentin (NEURONTIN) 300 MG capsule Take 1 capsule (300 mg total) by mouth at bedtime. 1 capsule at bedtime 180 capsule 3   Krill Oil 500 MG CAPS Take 500 mg by mouth daily.     melatonin 5 MG TABS Take 5 mg by mouth at bedtime.     metoprolol succinate (TOPROL-XL) 25 MG 24 hr tablet TAKE 1.5 TABLETS BY MOUTH DAILY. 135 tablet 3   Multiple Vitamins-Minerals (CENTRUM ADULTS PO) Take 1 tablet by mouth 3 (three) times a week.     rosuvastatin (CRESTOR) 20 MG tablet TAKE 1 TABLET BY MOUTH EVERY DAY IN THE EVENING 90 tablet 2   No current facility-administered medications for this encounter.    ROS- All systems are reviewed and negative except as per the HPI above.  Physical Exam: Vitals:   03/03/24 0927  BP: (!) 140/100  Pulse: 73  Weight: 110.2 kg  Height: 6' (1.829 m)    GEN: Well nourished, well developed in no acute distress CARDIAC: Regular rate and rhythm, no murmurs, rubs, gallops RESPIRATORY:  Clear to auscultation without rales, wheezing or rhonchi  ABDOMEN: Soft, non-tender, non-distended EXTREMITIES:  No edema; No deformity    Wt Readings from Last 3 Encounters:  03/03/24 110.2 kg  02/23/24 109.9 kg  02/15/24 108 kg    EKG today demonstrates  Typical atrial flutter with predominantly 4:1 block Vent. rate 73 BPM PR interval * ms QRS duration 82 ms QT/QTcB 414/456 ms   Echo 01/07/21  1. Left ventricular ejection fraction, by estimation, is 55 to 60%. The  left ventricle has normal function. The left ventricle has no regional  wall motion abnormalities. There is mild concentric left ventricular  hypertrophy of the septal segment. Left ventricular diastolic parameters are consistent with Grade I diastolic dysfunction (impaired relaxation). The average left ventricular global longitudinal strain is 19.8 %. The global longitudinal strain is normal.   2. Right ventricular systolic function is normal. The right ventricular  size is normal. There is normal  pulmonary artery systolic pressure.   3. Left atrial size was mildly dilated.   4. Right atrial size was mildly dilated.   5. The mitral valve is normal in structure. No evidence of mitral valve  regurgitation. No evidence of mitral stenosis.   6. The aortic valve is tricuspid. Aortic valve regurgitation is trivial.  No aortic stenosis is present.   7. Aortic dilatation noted. There is moderate dilatation of the aortic  root, measuring 46 mm. There is moderate dilatation of the ascending  aorta, measuring 44 mm.   8. The inferior vena cava is normal in size with greater than 50%  respiratory variability, suggesting right atrial pressure of 3 mmHg.    Epic records are reviewed at length today  CHA2DS2-VASc Score = 4  The patient's score is based upon: CHF History: 1 HTN History: 1 Diabetes History: 0 Stroke History: 0 Vascular Disease History: 1 Age Score: 1 Gender Score: 0       ASSESSMENT AND PLAN: Typical and atypical atrial flutter The patient's CHA2DS2-VASc score is 4, indicating a 4.8% annual risk of stroke.   S/p DCCV 02/15/24 with quick return of flutter We discussed rhythm control options today. After discussing the risks and  benefits, will start flecainide 50 mg BID. If he does not chemically convert, will increase dose and arrange for DCCV. CAC score 0 in 2022. Patient also agreeable to consultation with Dr Elberta Fortis for ablation.  Continue Eliquis 5 mg BID Continue Toprol 37.5 mg daily Kardia mobile for home monitoring.   Secondary Hypercoagulable State (ICD10:  D68.69) The patient is at significant risk for stroke/thromboembolism based upon his CHA2DS2-VASc Score of 4.  Continue Apixaban (Eliquis). No bleeding issues.   HFrecEF EF 60-65% on TEE, normalized with SR Fluid status appears stable today  HTN Diastolic elevated today, will reassess in SR.    Follow up in the AF clinic next week for ECG.     Jorja Loa PA-C Af[facility] Clinic [facility] [address] [place], [place] [address] [phone] 03/03/2024 9:37 AM

## 2024-03-03 NOTE — Patient Instructions (Addendum)
Start flecainide 50mg twice a day 

## 2024-03-07 DIAGNOSIS — H2512 Age-related nuclear cataract, left eye: Secondary | ICD-10-CM | POA: Diagnosis not present

## 2024-03-07 DIAGNOSIS — H25043 Posterior subcapsular polar age-related cataract, bilateral: Secondary | ICD-10-CM | POA: Diagnosis not present

## 2024-03-07 DIAGNOSIS — H2513 Age-related nuclear cataract, bilateral: Secondary | ICD-10-CM | POA: Diagnosis not present

## 2024-03-07 DIAGNOSIS — H18413 Arcus senilis, bilateral: Secondary | ICD-10-CM | POA: Diagnosis not present

## 2024-03-07 DIAGNOSIS — H25013 Cortical age-related cataract, bilateral: Secondary | ICD-10-CM | POA: Diagnosis not present

## 2024-03-09 ENCOUNTER — Ambulatory Visit (HOSPITAL_COMMUNITY)
Admission: RE | Admit: 2024-03-09 | Discharge: 2024-03-09 | Disposition: A | Discharge status: Home / Self Care | Source: Ambulatory Visit | Attending: Physician Assistant | Admitting: Physician Assistant

## 2024-03-09 DIAGNOSIS — Z5181 Encounter for therapeutic drug level monitoring: Secondary | ICD-10-CM

## 2024-03-09 DIAGNOSIS — I483 Typical atrial flutter: Secondary | ICD-10-CM | POA: Diagnosis not present

## 2024-03-09 DIAGNOSIS — D6869 Other thrombophilia: Secondary | ICD-10-CM | POA: Diagnosis not present

## 2024-03-09 DIAGNOSIS — I4819 Other persistent atrial fibrillation: Secondary | ICD-10-CM

## 2024-03-09 NOTE — Patient Instructions (Signed)
 Cardioversion scheduled for: Thursday, April 10th    - Arrive at the [facility] and go to admitting at 10:00AM   - Do not eat or drink anything after midnight the night prior to your procedure.   - Take all your morning medication (except diabetic medications) with a sip of water prior to arrival.  - You will not be able to drive home after your procedure.    - Do NOT miss any doses of your blood thinner - if you should miss a dose please notify our office immediately.   - If you feel as if you go back into normal rhythm prior to scheduled cardioversion, please notify our office immediately.   If your procedure is canceled in the cardioversion suite you will be charged a cancellation fee.

## 2024-03-09 NOTE — Progress Notes (Signed)
 Patient returns for ECG after starting flecainide. ECG shows:  Atrial flutter with variable AV block Vent. rate 86 BPM PR interval * ms QRS duration 100 ms QT/QTcB 398/476 ms  Will arrange for repeat DCCV since he is on flecainide. Will not increase dose of flecainide given his baseline 1st degree block. Follow up with Dr Elberta Fortis as scheduled to discuss ablation.    Informed Consent   Shared Decision Making/Informed Consent The risks (stroke, cardiac arrhythmias rarely resulting in the need for a temporary or permanent pacemaker, skin irritation or burns and complications associated with conscious sedation including aspiration, arrhythmia, respiratory failure and death), benefits (restoration of normal sinus rhythm) and alternatives of a direct current cardioversion were explained in detail to [name] and he agrees to proceed.

## 2024-03-09 NOTE — Addendum Note (Signed)
 Encounter addended by: Shona Simpson, RN on: 03/09/2024 10:00 AM  Actions taken: Order list changed

## 2024-03-10 NOTE — Progress Notes (Signed)
 Message left on unidentified answering machine with time of arrival, 1200.  NPO after MN. Take Eliquis in the morning.  Instructed will need driver and someone to stay with tomorrow night.  Encouraged to call back for instructions.

## 2024-03-11 ENCOUNTER — Ambulatory Visit (HOSPITAL_COMMUNITY): Admitting: Anesthesiology

## 2024-03-11 ENCOUNTER — Encounter (HOSPITAL_COMMUNITY): Payer: Self-pay | Admitting: Cardiovascular Disease

## 2024-03-11 ENCOUNTER — Ambulatory Visit (HOSPITAL_COMMUNITY)
Admission: RE | Admit: 2024-03-11 | Discharge: 2024-03-11 | Disposition: A | Discharge status: Home / Self Care | Attending: Cardiovascular Disease | Admitting: Cardiovascular Disease

## 2024-03-11 ENCOUNTER — Encounter (HOSPITAL_COMMUNITY)
Admission: RE | Disposition: A | Discharge status: Home / Self Care | Payer: Self-pay | Source: Home / Self Care | Attending: Cardiovascular Disease

## 2024-03-11 ENCOUNTER — Other Ambulatory Visit: Payer: Self-pay

## 2024-03-11 DIAGNOSIS — I4892 Unspecified atrial flutter: Secondary | ICD-10-CM

## 2024-03-11 DIAGNOSIS — Z79899 Other long term (current) drug therapy: Secondary | ICD-10-CM | POA: Insufficient documentation

## 2024-03-11 DIAGNOSIS — D6869 Other thrombophilia: Secondary | ICD-10-CM | POA: Diagnosis not present

## 2024-03-11 DIAGNOSIS — I483 Typical atrial flutter: Secondary | ICD-10-CM | POA: Diagnosis not present

## 2024-03-11 DIAGNOSIS — Z7901 Long term (current) use of anticoagulants: Secondary | ICD-10-CM | POA: Insufficient documentation

## 2024-03-11 DIAGNOSIS — I4819 Other persistent atrial fibrillation: Secondary | ICD-10-CM | POA: Insufficient documentation

## 2024-03-11 DIAGNOSIS — I5042 Chronic combined systolic (congestive) and diastolic (congestive) heart failure: Secondary | ICD-10-CM | POA: Diagnosis not present

## 2024-03-11 DIAGNOSIS — J45909 Unspecified asthma, uncomplicated: Secondary | ICD-10-CM | POA: Diagnosis not present

## 2024-03-11 DIAGNOSIS — I110 Hypertensive heart disease with heart failure: Secondary | ICD-10-CM

## 2024-03-11 DIAGNOSIS — E785 Hyperlipidemia, unspecified: Secondary | ICD-10-CM | POA: Insufficient documentation

## 2024-03-11 DIAGNOSIS — K219 Gastro-esophageal reflux disease without esophagitis: Secondary | ICD-10-CM | POA: Insufficient documentation

## 2024-03-11 DIAGNOSIS — I509 Heart failure, unspecified: Secondary | ICD-10-CM | POA: Diagnosis not present

## 2024-03-11 DIAGNOSIS — I484 Atypical atrial flutter: Secondary | ICD-10-CM | POA: Insufficient documentation

## 2024-03-11 DIAGNOSIS — Z006 Encounter for examination for normal comparison and control in clinical research program: Secondary | ICD-10-CM

## 2024-03-11 SURGERY — CARDIOVERSION (CATH LAB)
Anesthesia: General

## 2024-03-11 MED ORDER — LIDOCAINE 2% (20 MG/ML) 5 ML SYRINGE
INTRAMUSCULAR | Status: DC | PRN
  Administered 2024-03-11: 40 mg via INTRAVENOUS

## 2024-03-11 MED ORDER — SODIUM CHLORIDE 0.9% FLUSH
3.0000 mL | Freq: Two times a day (BID) | INTRAVENOUS | Status: DC
  Administered 2024-03-11: 10 mL via INTRAVENOUS

## 2024-03-11 MED ORDER — PROPOFOL 10 MG/ML IV BOLUS
INTRAVENOUS | Status: DC | PRN
  Administered 2024-03-11: 80 mg via INTRAVENOUS

## 2024-03-11 MED ORDER — PHENYLEPHRINE 80 MCG/ML (10ML) SYRINGE FOR IV PUSH (FOR BLOOD PRESSURE SUPPORT)
PREFILLED_SYRINGE | INTRAVENOUS | Status: DC | PRN
  Administered 2024-03-11: 80 ug via INTRAVENOUS

## 2024-03-11 MED ORDER — SODIUM CHLORIDE 0.9% FLUSH: 3.0000 mL | INTRAVENOUS | Status: DC | PRN

## 2024-03-11 SURGICAL SUPPLY — 1 items: PAD DEFIB RADIO PHYSIO CONN (PAD) ×1 IMPLANT

## 2024-03-11 NOTE — Interval H&P Note (Signed)
 History and Physical Interval Note:  03/11/2024 12:11 PM  [name]  has presented today for surgery, with the diagnosis of AFIB.  The various methods of treatment have been discussed with the patient and family. After consideration of risks, benefits and other options for treatment, the patient has consented to  Procedure(s): CARDIOVERSION (N/A) as a surgical intervention.  The patient's history has been reviewed, patient examined, no change in status, stable for surgery.  I have reviewed the patient's chart and labs.  Questions were answered to the patient's satisfaction.     Trung Wenzl

## 2024-03-11 NOTE — Op Note (Signed)
 Procedure: Electrical Cardioversion Indications:  Atrial Flutter  Procedure Details:  Consent: Risks of procedure as well as the alternatives and risks of each were explained to the (patient/caregiver).  Consent for procedure obtained.  Time Out: Verified patient identification, verified procedure, site/side was marked, verified correct patient position, special equipment/implants available, medications/allergies/relevent history reviewed, required imaging and test results available.  Performed  Patient placed on cardiac monitor, pulse oximetry, supplemental oxygen as necessary.  Sedation given:  propofol 80 mg IV, Dr. Freida Busman Pacer pads placed anterior and posterior chest.  Cardioverted 1 time(s).  Cardioversion with synchronized biphasic 200J shock.  Evaluation: Findings: Post procedure EKG shows: NSR Complications: None Patient did tolerate procedure well.  Time Spent Directly with the Patient:  30 minutes   [name] 03/11/2024, 12:30 PM

## 2024-03-11 NOTE — Anesthesia Preprocedure Evaluation (Addendum)
 Anesthesia Evaluation  Patient identified by MRN, date of birth, ID band Patient awake    Reviewed: Allergy & Precautions, NPO status , Patient's Chart, lab work & pertinent test results  History of Anesthesia Complications Negative for: history of anesthetic complications  Airway Mallampati: III  TM Distance: >3 FB Neck ROM: Full    Dental  (+) Dental Advisory Given   Pulmonary neg shortness of breath, asthma , neg sleep apnea, neg COPD, neg recent URI, neg PE Chronic cough   Pulmonary exam normal breath sounds clear to auscultation       Cardiovascular hypertension, Pt. on home beta blockers (-) angina +CHF, + DOE and + DVT  (-) Past MI, (-) Cardiac Stents and (-) CABG + dysrhythmias Atrial Fibrillation  Rhythm:Regular Rate:Normal  HLD  TEE 02/15/2024: IMPRESSIONS    1. Left ventricular ejection fraction, by estimation, is 60 to 65%. The  left ventricle has normal function.   2. Right ventricular systolic function is normal. The right ventricular  size is normal.   3. No left atrial/left atrial appendage thrombus was detected.   4. The mitral valve is normal in structure. No evidence of mitral valve  regurgitation.   5. The aortic valve is normal in structure. Aortic valve regurgitation is  not visualized.   6. Evidence of atrial level shunting detected by color flow Doppler.  There is a small patent foramen ovale.     Neuro/Psych negative neurological ROS     GI/Hepatic Neg liver ROS,GERD  Medicated,,  Endo/Other  negative endocrine ROS    Renal/GU CRFRenal disease     Musculoskeletal  (+) Arthritis , Osteoarthritis,    Abdominal  (+) + obese  Peds  Hematology negative hematology ROS (+) Lab Results      Component                Value               Date                      WBC                      5.9                 02/11/2024                HGB                      14.3                02/11/2024                 HCT                      44.7                02/11/2024                MCV                      94.1                02/11/2024                PLT                      203  02/11/2024              Anesthesia Other Findings Last Eliquis: this morning  Reproductive/Obstetrics                             Anesthesia Physical Anesthesia Plan  ASA: 3  Anesthesia Plan: General   Post-op Pain Management:    Induction: Intravenous  PONV Risk Score and Plan: 2 and Treatment may vary due to age or medical condition  Airway Management Planned: Natural Airway and Nasal Cannula  Additional Equipment:   Intra-op Plan:   Post-operative Plan:   Informed Consent: I have reviewed the patients History and Physical, chart, labs and discussed the procedure including the risks, benefits and alternatives for the proposed anesthesia with the patient or authorized representative who has indicated his/her understanding and acceptance.     Dental advisory given  Plan Discussed with: CRNA and Anesthesiologist  Anesthesia Plan Comments: (Risks of general anesthesia discussed including, but not limited to, sore throat, hoarse voice, chipped/damaged teeth, injury to vocal cords, nausea and vomiting, allergic reactions, lung infection, heart attack, stroke, and death. All questions answered. )        Anesthesia Quick Evaluation

## 2024-03-11 NOTE — Transfer of Care (Signed)
 Immediate Anesthesia Transfer of Care Note  Patient: [name]  Procedure(s) Performed: CARDIOVERSION  Patient Location: PACU  Anesthesia Type:MAC  Level of Consciousness: sedated  Airway & Oxygen Therapy: Patient Spontanous Breathing  Post-op Assessment: Report given to RN  Post vital signs: Reviewed and stable  Last Vitals:  Vitals Value Taken Time  BP 103/83   Temp    Pulse 60   Resp 18   SpO2 98     Last Pain:  Vitals:   03/11/24 1210  TempSrc: Temporal         Complications: No notable events documented.

## 2024-03-11 NOTE — Research (Signed)
 [facility] Cardioversion Informed Consent   Subject Name: [name]  Subject met inclusion and exclusion criteria.  The informed consent form, study requirements and expectations were reviewed with the subject and questions and concerns were addressed prior to the signing of the consent form.  The subject verbalized understanding of the trial requirements.  The subject agreed to participate in the [facility] Cardioversion trial and signed the informed consent at 1210 on 11/Apr/2025.  The informed consent was obtained prior to performance of any protocol-specific procedures for the subject.  A copy of the signed informed consent was given to the subject and a copy was placed in the subject's medical record.   Kandis Mannan [name]

## 2024-03-11 NOTE — Anesthesia Postprocedure Evaluation (Signed)
 Anesthesia Post Note  Patient: [name]  Procedure(s) Performed: CARDIOVERSION     Patient location during evaluation: PACU Anesthesia Type: General Level of consciousness: awake Pain management: pain level controlled Vital Signs Assessment: post-procedure vital signs reviewed and stable Respiratory status: spontaneous breathing, nonlabored ventilation and respiratory function stable Cardiovascular status: blood pressure returned to baseline and stable Postop Assessment: no apparent nausea or vomiting Anesthetic complications: no   No notable events documented.  Last Vitals:  Vitals:   03/11/24 1210 03/11/24 1236  BP: 116/79   Pulse: 81   Resp: (!) 21   Temp: (!) 36.3 C 36.5 C  SpO2: 96%     Last Pain:  Vitals:   03/11/24 1236  TempSrc: Temporal  PainSc: 0-No pain                 Linton Rump

## 2024-03-21 ENCOUNTER — Other Ambulatory Visit: Payer: Self-pay | Admitting: Cardiovascular Disease

## 2024-03-21 DIAGNOSIS — I4892 Unspecified atrial flutter: Secondary | ICD-10-CM

## 2024-03-21 DIAGNOSIS — I824Y1 Acute embolism and thrombosis of unspecified deep veins of right proximal lower extremity: Secondary | ICD-10-CM

## 2024-03-21 NOTE — Telephone Encounter (Signed)
 Prescription refill request for Eliquis  received. Indication:aflutter Last office visit:4/25 Scr:1.36  3/25 Age: 73 Weight:108.9  kg  Prescription refilled

## 2024-03-28 NOTE — Progress Notes (Unsigned)
 Electrophysiology Office Note:   Date:  03/29/2024  ID:  QUINTEZ SOBIECK, DOB [birth year], MRN [id number]  Primary Cardiologist: Maudine Sos, MD Primary Heart Failure: None Electrophysiologist: Pearly Bartosik Cortland Ding, MD      History of Present Illness:   [name] is a 73 y.o. 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   He presented to the A-fib clinic 02/11/2024 with elevated heart rates.  He was found to be in atrial flutter.  He had a cardioversion.  He Sherrlyn Dolores presented for 325 again in atrial flutter and is now post cardioversion 03/11/2024.  He is now on flecainide .  Since last being seen in our clinic the patient reports doing well.  He has noted no further episodes of atrial flutter.  He is somewhat minimally symptomatic from his episodes, but his heart rates are quite rapid.  He has been started on flecainide  and has been doing well.  he denies chest pain, palpitations, dyspnea, PND, orthopnea, nausea, vomiting, dizziness, syncope, edema, weight gain, or early satiety.   Review of systems complete and found to be negative unless listed in HPI.   EP Information / Studies Reviewed:    EKG is ordered today. Personal review as below.  EKG Interpretation Date/Time:  Tuesday March 29 2024 10:34:52 EDT Ventricular Rate:  58 PR Interval:  266 QRS Duration:  96 QT Interval:  446 QTC Calculation: 437 R Axis:   -77  Text Interpretation: Sinus bradycardia with 1st degree A-V block Left axis deviation When compared with ECG of 11-Mar-2024 13:05, No significant change was found Confirmed by Harvey Matlack ([id number]) on 03/29/2024 10:42:08 AM     Risk Assessment/Calculations:    CHA2DS2-VASc Score = 4   This indicates a 4.8% annual risk of stroke. The patient's score is based  upon: CHF History: 1 HTN History: 1 Diabetes History: 0 Stroke History: 0 Vascular Disease History: 1 Age Score: 1 Gender Score: 0       STOP-Bang Score:          Physical Exam:   VS:  BP 130/74 (BP Location: Right Arm, Patient Position: Sitting, Cuff Size: Large)   Pulse (!) 58   Ht 6' (1.829 m)   Wt 244 lb 8 oz (110.9 kg)   SpO2 96%   BMI 33.16 kg/m    Wt Readings from Last 3 Encounters:  03/29/24 244 lb 8 oz (110.9 kg)  03/11/24 240 lb (108.9 kg)  03/03/24 243 lb (110.2 kg)     GEN: Well nourished, well developed in no acute distress NECK: No JVD; No carotid bruits CARDIAC: Regular rate and rhythm, no murmurs, rubs, gallops RESPIRATORY:  Clear to auscultation without rales, wheezing or rhonchi  ABDOMEN: Soft, non-tender, non-distended EXTREMITIES:  No edema; No deformity   ASSESSMENT AND PLAN:    1.  Typical and atypical atrial flutter: Currently on flecainide  50 mg twice daily and Toprol -XL.  He remains in sinus rhythm after his cardioversion.  Despite this, he has had multiple cardioversions and would prefer alternative rhythm control.  He would like to avoid long-term antiarrhythmic medications.  Due to that, we Schawn Byas plan for ablation.  We Papa Piercefield set this ablation up as an atrial fibrillation ablation.  Treana Lacour have him hold his flecainide  for 4 days prior to ablation.  Risk, benefits, and alternatives to EP study and radiofrequency/pulse field ablation for afib  were also discussed in detail today. These risks include but are not limited to stroke, bleeding, vascular damage, tamponade, perforation, damage to the esophagus, lungs, and other structures, pulmonary vein stenosis, worsening renal function, and death. The patient understands these risk and wishes to proceed.  We Osha Errico therefore proceed with catheter ablation at the next available time.  Carto, ICE, anesthesia are requested for the procedure.  Caretha Rumbaugh also obtain CT PV protocol prior to the procedure to exclude LAA  thrombus and further evaluate atrial anatomy.  2.  Secondary hypercoagulable state: On Eliquis  for atrial fibrillation  3.  Hyperlipidemia: Continue Crestor  per primary cardiology primary physician  Follow up with Dr. Lawana Pray as usual post procedure  Signed, Loise Esguerra Cortland Ding, MD

## 2024-03-29 ENCOUNTER — Ambulatory Visit: Attending: Cardiology | Admitting: Cardiology

## 2024-03-29 ENCOUNTER — Encounter: Payer: Self-pay | Admitting: Cardiology

## 2024-03-29 VITALS — BP 130/74 | HR 58 | Ht 72.0 in | Wt 244.5 lb

## 2024-03-29 DIAGNOSIS — I483 Typical atrial flutter: Secondary | ICD-10-CM

## 2024-03-29 DIAGNOSIS — Z01812 Encounter for preprocedural laboratory examination: Secondary | ICD-10-CM

## 2024-03-29 DIAGNOSIS — D6869 Other thrombophilia: Secondary | ICD-10-CM | POA: Diagnosis not present

## 2024-03-29 DIAGNOSIS — I484 Atypical atrial flutter: Secondary | ICD-10-CM

## 2024-03-29 NOTE — Patient Instructions (Signed)
 Medication Instructions:  Your physician recommends that you continue on your current medications as directed. Please refer to the Current Medication list given to you today.  *If you need a refill on your cardiac medications before your next appointment, please call your pharmacy*   Lab Work: Pre procedure labs -- we will call you to schedule:  BMP & CBC  If you have a lab test that is abnormal and we need to change your treatment, we will call you to review the results -- otherwise no news is good news.    Testing/Procedures: Your physician has requested that you have cardiac CT 1 month PRIOR to your ablation. Cardiac computed tomography (CT) is a painless test that uses an x-ray machine to take clear, detailed pictures of your heart. We will contact you if the result is abnormal. We will call you to schedule.  Your physician has recommended that you have an ablation. Catheter ablation is a medical procedure used to treat some cardiac arrhythmias (irregular heartbeats). During catheter ablation, a long, thin, flexible tube is put into a blood vessel in your groin (upper thigh), or neck. This tube is called an ablation catheter. It is then guided to your heart through the blood vessel. Radio frequency waves destroy small areas of heart tissue where abnormal heartbeats may cause an arrhythmia to start.   Your ablation is scheduled for 07/01/2024. Please arrive at [facility] at 8:00 am.  We will call/send instructions at a later date.   Follow-Up: At [facility], you and your health needs are our priority.  As part of our continuing mission to provide you with exceptional heart care, we have created designated Provider Care Teams.  These Care Teams include your primary Cardiologist (physician) and Advanced Practice Providers (APPs -  Physician Assistants and Nurse Practitioners) who all work together to provide you with the care you need, when you need it.  Your next appointment:    1 month(s) after your ablation  The format for your next appointment:   In Person  Provider:   AFib clinic   Thank you for choosing [facility]!!   Reece Cane, RN [phone]    Other Instructions   Cardiac Ablation Cardiac ablation is a procedure to destroy (ablate) some heart tissue that is sending bad signals. These bad signals cause problems in heart rhythm. The heart has many areas that make these signals. If there are problems in these areas, they can make the heart beat in a way that is not normal. Destroying some tissues can help make the heart rhythm normal. Tell your doctor about: Any allergies you have. All medicines you are taking. These include vitamins, herbs, eye drops, creams, and over-the-counter medicines. Any problems you or family members have had with medicines that make you fall asleep (anesthetics). Any blood disorders you have. Any surgeries you have had. Any medical conditions you have, such as kidney failure. Whether you are pregnant or may be pregnant. What are the risks? This is a safe procedure. But problems may occur, including: Infection. Bruising and bleeding. Bleeding into the chest. Stroke or blood clots. Damage to nearby areas of your body. Allergies to medicines or dyes. The need for a pacemaker if the normal system is damaged. Failure of the procedure to treat the problem. What happens before the procedure? Medicines Ask your doctor about: Changing or stopping your normal medicines. This is important. Taking aspirin  and ibuprofen . Do not take these medicines unless your doctor tells you  to take them. Taking other medicines, vitamins, herbs, and supplements. General instructions Follow instructions from your doctor about what you cannot eat or drink. Plan to have someone take you home from the hospital or clinic. If you will be going home right after the procedure, plan to have someone with you for 24 hours. Ask your  doctor what steps will be taken to prevent infection. What happens during the procedure?  An IV tube will be put into one of your veins. You will be given a medicine to help you relax. The skin on your neck or groin will be numbed. A cut (incision) will be made in your neck or groin. A needle will be put through your cut and into a large vein. A tube (catheter) will be put into the needle. The tube will be moved to your heart. Dye may be put through the tube. This helps your doctor see your heart. Small devices (electrodes) on the tube will send out signals. A type of energy will be used to destroy some heart tissue. The tube will be taken out. Pressure will be held on your cut. This helps stop bleeding. A bandage will be put over your cut. The exact procedure may vary among doctors and hospitals. What happens after the procedure? You will be watched until you leave the hospital or clinic. This includes checking your heart rate, breathing rate, oxygen, and blood pressure. Your cut will be watched for bleeding. You will need to lie still for a few hours. Do not drive for 24 hours or as long as your doctor tells you. Summary Cardiac ablation is a procedure to destroy some heart tissue. This is done to treat heart rhythm problems. Tell your doctor about any medical conditions you may have. Tell him or her about all medicines you are taking to treat them. This is a safe procedure. But problems may occur. These include infection, bruising, bleeding, and damage to nearby areas of your body. Follow what your doctor tells you about food and drink. You may also be told to change or stop some of your medicines. After the procedure, do not drive for 24 hours or as long as your doctor tells you. This information is not intended to replace advice given to you by your health care provider. Make sure you discuss any questions you have with your health care provider. Document Revised: 02/07/2022 Document  Reviewed: 10/20/2019 Elsevier Patient Education  2023 [employer] Inc[employer]   Cardiac Ablation, Care After  This sheet gives you information about how to care for yourself after your procedure. Your health care provider may also give you more specific instructions. If you have problems or questions, contact your health care provider. What can I expect after the procedure? After the procedure, it is common to have: Bruising around your puncture site. Tenderness around your puncture site. Skipped heartbeats. If you had an atrial fibrillation ablation, you may have atrial fibrillation during the first several months after your procedure.  Tiredness (fatigue).  Follow these instructions at home: Puncture site care  Follow instructions from your health care provider about how to take care of your puncture site. Make sure you: If present, leave stitches (sutures), skin glue, or adhesive strips in place. These skin closures may need to stay in place for up to 2 weeks. If adhesive strip edges start to loosen and curl up, you may trim the loose edges. Do not remove adhesive strips completely unless your health care provider tells you to do  that. If a large square bandage is present, this may be removed 24 hours after surgery.  Check your puncture site every day for signs of infection. Check for: Redness, swelling, or pain. Fluid or blood. If your puncture site starts to bleed, lie down on your back, apply firm pressure to the area, and contact your health care provider. Warmth. Pus or a bad smell. A pea or small marble sized lump at the site is normal and can take up to three months to resolve.  Driving Do not drive for at least 4 days after your procedure or however long your health care provider recommends. (Do not resume driving if you have previously been instructed not to drive for other health reasons.) Do not drive or use heavy machinery while taking prescription pain medicine. Activity Avoid  activities that take a lot of effort for at least 7 days after your procedure. Do not lift anything that is heavier than 5 lb (4.5 kg) for one week.  No sexual activity for 1 week.  Return to your normal activities as told by your health care provider. Ask your health care provider what activities are safe for you. General instructions Take over-the-counter and prescription medicines only as told by your health care provider. Do not use any products that contain nicotine or tobacco, such as cigarettes and e-cigarettes. If you need help quitting, ask your health care provider. You may shower after 24 hours, but Do not take baths, swim, or use a hot tub for 1 week.  Do not drink alcohol for 24 hours after your procedure. Keep all follow-up visits as told by your health care provider. This is important. Contact a health care provider if: You have redness, mild swelling, or pain around your puncture site. You have fluid or blood coming from your puncture site that stops after applying firm pressure to the area. Your puncture site feels warm to the touch. You have pus or a bad smell coming from your puncture site. You have a fever. You have chest pain or discomfort that spreads to your neck, jaw, or arm. You have chest pain that is worse with lying on your back or taking a deep breath. You are sweating a lot. You feel nauseous. You have a fast or irregular heartbeat. You have shortness of breath. You are dizzy or light-headed and feel the need to lie down. You have pain or numbness in the arm or leg closest to your puncture site. Get help right away if: Your puncture site suddenly swells. Your puncture site is bleeding and the bleeding does not stop after applying firm pressure to the area. These symptoms may represent a serious problem that is an emergency. Do not wait to see if the symptoms will go away. Get medical help right away. Call your local emergency services (911 in the [place] Do not  drive yourself to the hospital. Summary After the procedure, it is normal to have bruising and tenderness at the puncture site in your groin, neck, or forearm. Check your puncture site every day for signs of infection. Get help right away if your puncture site is bleeding and the bleeding does not stop after applying firm pressure to the area. This is a medical emergency. This information is not intended to replace advice given to you by your health care provider. Make sure you discuss any questions you have with your health care provider.

## 2024-05-29 ENCOUNTER — Other Ambulatory Visit (HOSPITAL_COMMUNITY): Payer: Self-pay | Admitting: Physician Assistant

## 2024-05-31 ENCOUNTER — Other Ambulatory Visit: Payer: Self-pay

## 2024-05-31 DIAGNOSIS — Z01812 Encounter for preprocedural laboratory examination: Secondary | ICD-10-CM

## 2024-06-06 ENCOUNTER — Telehealth: Payer: Self-pay

## 2024-06-06 DIAGNOSIS — Z01812 Encounter for preprocedural laboratory examination: Secondary | ICD-10-CM | POA: Diagnosis not present

## 2024-06-06 LAB — CBC
Hematocrit: 45.5 % (ref 37.5–51.0)
Hemoglobin: 14.7 g/dL (ref 13.0–17.7)
MCH: 30.4 pg (ref 26.6–33.0)
MCHC: 32.3 g/dL (ref 31.5–35.7)
MCV: 94 fL (ref 79–97)
Platelets: 209 x10E3/uL (ref 150–450)
RBC: 4.83 x10E6/uL (ref 4.14–5.80)
RDW: 12.6 % (ref 11.6–15.4)
WBC: 5.6 x10E3/uL (ref 3.4–10.8)

## 2024-06-06 LAB — BASIC METABOLIC PANEL WITH GFR
BUN/Creatinine Ratio: 11 (ref 10–24)
BUN: 15 mg/dL (ref 8–27)
CO2: 20 mmol/L (ref 20–29)
Calcium: 9.4 mg/dL (ref 8.6–10.2)
Chloride: 106 mmol/L (ref 96–106)
Creatinine, Ser: 1.35 mg/dL — ABNORMAL HIGH (ref 0.76–1.27)
Glucose: 87 mg/dL (ref 70–99)
Potassium: 4.8 mmol/L (ref 3.5–5.2)
Sodium: 144 mmol/L (ref 134–144)
eGFR: 56 mL/min/1.73 — ABNORMAL LOW (ref >59)

## 2024-06-06 NOTE — Telephone Encounter (Signed)
 LM for pt to call back to offer sooner AF Ablation with Dr. Inocencio.

## 2024-06-07 ENCOUNTER — Encounter: Payer: Self-pay | Admitting: Emergency Medicine

## 2024-06-07 NOTE — Telephone Encounter (Signed)
 Pt returned my call - His Afib/flutter Ablation with Dr. Inocencio has been moved from 8/1 to 7/17 at 7:30 am.   He would like to proceed with his CT that is scheduled on 7/11...  I will send pt a message via MyChart with updated instructions.

## 2024-06-09 ENCOUNTER — Telehealth (HOSPITAL_COMMUNITY): Payer: Self-pay

## 2024-06-09 NOTE — Telephone Encounter (Signed)
 Attempted to reach patient to discuss upcoming procedure, no answer. Left VM for patient to return call.

## 2024-06-10 ENCOUNTER — Ambulatory Visit (HOSPITAL_COMMUNITY)
Admission: RE | Admit: 2024-06-10 | Discharge: 2024-06-10 | Disposition: A | Discharge status: Home / Self Care | Source: Ambulatory Visit | Attending: Cardiology

## 2024-06-10 ENCOUNTER — Other Ambulatory Visit (HOSPITAL_COMMUNITY)

## 2024-06-10 DIAGNOSIS — I484 Atypical atrial flutter: Secondary | ICD-10-CM | POA: Diagnosis not present

## 2024-06-10 DIAGNOSIS — I700 Atherosclerosis of aorta: Secondary | ICD-10-CM | POA: Diagnosis not present

## 2024-06-10 DIAGNOSIS — I517 Cardiomegaly: Secondary | ICD-10-CM | POA: Insufficient documentation

## 2024-06-10 MED ORDER — IOHEXOL 350 MG/ML SOLN
100.0000 mL | Freq: Once | INTRAVENOUS | Status: AC | PRN
  Administered 2024-06-10: 100 mL via INTRAVENOUS

## 2024-06-10 NOTE — Telephone Encounter (Addendum)
 Patient returned call to discuss upcoming procedure.   CT: completed.  Labs: completed.   Any recent signs of acute illness or been started on antibiotics? No Any new medications started? No Any medications to hold? Hold Flecainide  4 days prior to our procedure- lase dose on July 12.  Any missed doses of blood thinner?  No Advised patient to continue taking ANTICOAGULANT: Eliquis  (Apixaban ) twice daily without missing any doses.  Medication instructions:  On the morning of your procedure DO NOT take any medication., including Eliquis  or the procedure may be rescheduled. Nothing to eat or drink after midnight prior to your procedure.  Confirmed patient is scheduled for Atrial Fibrillation Ablation on Thursday, July 17 with Dr. Soyla Norton. Instructed patient to arrive at the Main Entrance A at [facility]: [address] [place], [place] [address] and check in at Admitting at 5:30 AM.   Advised of plan to go home the same day and will only stay overnight if medically necessary. You MUST have a responsible adult to drive you home and MUST be with you the first 24 hours after you arrive home or your procedure could be cancelled.  Patient verbalized understanding to all instructions provided and agreed to proceed with procedure.

## 2024-06-13 ENCOUNTER — Telehealth: Payer: Self-pay

## 2024-06-13 NOTE — Telephone Encounter (Signed)
 Pt called in stating that he had missed a dose of Eliquis  on Friday night (7/11). He is scheduled for an Afib Ablation with Dr. Inocencio on 7/18. Per Dr. Inocencio he is ok to proceed but will schedule TEE (if needed) for day of procedure.   I called pt back and made him aware of this plan.

## 2024-06-15 NOTE — Anesthesia Preprocedure Evaluation (Signed)
 Anesthesia Evaluation  Patient identified by MRN, date of birth, ID band Patient awake    Reviewed: Allergy & Precautions, NPO status , Patient's Chart, lab work & pertinent test results, reviewed documented beta blocker date and time   Airway Mallampati: II  TM Distance: >3 FB Neck ROM: Full    Dental  (+) Teeth Intact, Dental Advisory Given   Pulmonary asthma    Pulmonary exam normal breath sounds clear to auscultation       Cardiovascular hypertension, Pt. on home beta blockers (-) angina + DOE  (-) CAD, (-) Past MI and (-) Cardiac Stents + dysrhythmias Atrial Fibrillation  Rhythm:Regular Rate:Normal     Neuro/Psych negative neurological ROS  negative psych ROS   GI/Hepatic Neg liver ROS,GERD  Medicated,,  Endo/Other  Obesity   Renal/GU Renal InsufficiencyRenal disease     Musculoskeletal  (+) Arthritis ,    Abdominal   Peds  Hematology  (+) Blood dyscrasia (Eliquis )   Anesthesia Other Findings   Reproductive/Obstetrics                              Anesthesia Physical Anesthesia Plan  ASA: 3  Anesthesia Plan: General   Post-op Pain Management: Tylenol  PO (pre-op)*   Induction: Intravenous  PONV Risk Score and Plan: 2 and Dexamethasone  and Ondansetron   Airway Management Planned: Oral ETT  Additional Equipment:   Intra-op Plan:   Post-operative Plan: Extubation in OR  Informed Consent: I have reviewed the patients History and Physical, chart, labs and discussed the procedure including the risks, benefits and alternatives for the proposed anesthesia with the patient or authorized representative who has indicated his/her understanding and acceptance.     Dental advisory given  Plan Discussed with: CRNA  Anesthesia Plan Comments:          Anesthesia Quick Evaluation

## 2024-06-15 NOTE — Pre-Procedure Instructions (Signed)
 Attempted to  call patient regarding procedure instructions for tomorrow.  Left voicemail on the following items: Arrival time 0515 Nothing to eat or drink after midnight No meds AM of procedure Responsible person to drive you home and stay with you for 24 hrs  Have you missed any doses of anti-coagulant Eliquis - should be taken twice a day, be sure to take both doses today.  Aware you missed a dose last week.  We will do EKG on arrival.

## 2024-06-16 ENCOUNTER — Encounter (HOSPITAL_COMMUNITY): Payer: Self-pay | Admitting: Cardiology

## 2024-06-16 ENCOUNTER — Other Ambulatory Visit: Payer: Self-pay

## 2024-06-16 ENCOUNTER — Ambulatory Visit (HOSPITAL_COMMUNITY)
Admission: RE | Disposition: A | Discharge status: Home / Self Care | Payer: Self-pay | Source: Home / Self Care | Attending: Cardiology

## 2024-06-16 ENCOUNTER — Ambulatory Visit (HOSPITAL_COMMUNITY)
Admission: RE | Admit: 2024-06-16 | Discharge: 2024-06-16 | Disposition: A | Discharge status: Home / Self Care | Attending: Cardiology | Admitting: Cardiology

## 2024-06-16 ENCOUNTER — Ambulatory Visit (HOSPITAL_BASED_OUTPATIENT_CLINIC_OR_DEPARTMENT_OTHER): Payer: Self-pay | Admitting: Anesthesiology

## 2024-06-16 ENCOUNTER — Ambulatory Visit (HOSPITAL_COMMUNITY): Payer: Self-pay | Admitting: Anesthesiology

## 2024-06-16 DIAGNOSIS — Z7901 Long term (current) use of anticoagulants: Secondary | ICD-10-CM | POA: Diagnosis not present

## 2024-06-16 DIAGNOSIS — I700 Atherosclerosis of aorta: Secondary | ICD-10-CM | POA: Diagnosis not present

## 2024-06-16 DIAGNOSIS — I4892 Unspecified atrial flutter: Secondary | ICD-10-CM

## 2024-06-16 DIAGNOSIS — Z79899 Other long term (current) drug therapy: Secondary | ICD-10-CM | POA: Diagnosis not present

## 2024-06-16 DIAGNOSIS — I110 Hypertensive heart disease with heart failure: Secondary | ICD-10-CM | POA: Diagnosis not present

## 2024-06-16 DIAGNOSIS — I5042 Chronic combined systolic (congestive) and diastolic (congestive) heart failure: Secondary | ICD-10-CM | POA: Insufficient documentation

## 2024-06-16 DIAGNOSIS — E669 Obesity, unspecified: Secondary | ICD-10-CM | POA: Insufficient documentation

## 2024-06-16 DIAGNOSIS — I10 Essential (primary) hypertension: Secondary | ICD-10-CM | POA: Diagnosis not present

## 2024-06-16 DIAGNOSIS — I480 Paroxysmal atrial fibrillation: Secondary | ICD-10-CM | POA: Diagnosis not present

## 2024-06-16 DIAGNOSIS — E785 Hyperlipidemia, unspecified: Secondary | ICD-10-CM | POA: Diagnosis not present

## 2024-06-16 DIAGNOSIS — I7121 Aneurysm of the ascending aorta, without rupture: Secondary | ICD-10-CM | POA: Diagnosis not present

## 2024-06-16 DIAGNOSIS — I483 Typical atrial flutter: Secondary | ICD-10-CM

## 2024-06-16 DIAGNOSIS — I484 Atypical atrial flutter: Secondary | ICD-10-CM | POA: Insufficient documentation

## 2024-06-16 DIAGNOSIS — Z6832 Body mass index (BMI) 32.0-32.9, adult: Secondary | ICD-10-CM | POA: Insufficient documentation

## 2024-06-16 DIAGNOSIS — K219 Gastro-esophageal reflux disease without esophagitis: Secondary | ICD-10-CM | POA: Diagnosis not present

## 2024-06-16 DIAGNOSIS — I4891 Unspecified atrial fibrillation: Secondary | ICD-10-CM | POA: Diagnosis not present

## 2024-06-16 LAB — POCT ACTIVATED CLOTTING TIME: Activated Clotting Time: 250 s

## 2024-06-16 MED ORDER — SODIUM CHLORIDE 0.9 % IV SOLN: INTRAVENOUS | Status: DC

## 2024-06-16 MED ORDER — ACETAMINOPHEN 325 MG PO TABS: 650.0000 mg | ORAL_TABLET | ORAL | Status: DC | PRN

## 2024-06-16 MED ORDER — PROPOFOL 1000 MG/100ML IV EMUL
INTRAVENOUS | Status: AC
  Filled 2024-06-16: qty 100

## 2024-06-16 MED ORDER — SODIUM CHLORIDE 0.9% FLUSH: 3.0000 mL | Freq: Two times a day (BID) | INTRAVENOUS | Status: DC

## 2024-06-16 MED ORDER — GLYCOPYRROLATE 0.2 MG/ML IJ SOLN
INTRAMUSCULAR | Status: DC | PRN
  Administered 2024-06-16: .2 mg via INTRAVENOUS

## 2024-06-16 MED ORDER — PROPOFOL 500 MG/50ML IV EMUL
INTRAVENOUS | Status: DC | PRN
  Administered 2024-06-16: 70 ug/kg/min via INTRAVENOUS

## 2024-06-16 MED ORDER — EPHEDRINE SULFATE-NACL 50-0.9 MG/10ML-% IV SOSY
PREFILLED_SYRINGE | INTRAVENOUS | Status: DC | PRN
  Administered 2024-06-16 (×2): 10 mg via INTRAVENOUS

## 2024-06-16 MED ORDER — HEPARIN (PORCINE) IN NACL 1000-0.9 UT/500ML-% IV SOLN
INTRAVENOUS | Status: DC | PRN
  Administered 2024-06-16 (×3): 500 mL

## 2024-06-16 MED ORDER — PHENYLEPHRINE 80 MCG/ML (10ML) SYRINGE FOR IV PUSH (FOR BLOOD PRESSURE SUPPORT)
PREFILLED_SYRINGE | INTRAVENOUS | Status: DC | PRN
Start: 2024-06-16 — End: 2024-06-16
  Administered 2024-06-16: 80 ug via INTRAVENOUS
  Administered 2024-06-16: 160 ug via INTRAVENOUS

## 2024-06-16 MED ORDER — PHENYLEPHRINE HCL-NACL 20-0.9 MG/250ML-% IV SOLN
INTRAVENOUS | Status: DC | PRN
  Administered 2024-06-16: 50 ug/min via INTRAVENOUS

## 2024-06-16 MED ORDER — ROCURONIUM BROMIDE 10 MG/ML (PF) SYRINGE
PREFILLED_SYRINGE | INTRAVENOUS | Status: DC | PRN
  Administered 2024-06-16: 20 mg via INTRAVENOUS
  Administered 2024-06-16: 60 mg via INTRAVENOUS

## 2024-06-16 MED ORDER — HEPARIN SODIUM (PORCINE) 1000 UNIT/ML IJ SOLN
INTRAMUSCULAR | Status: AC
Start: 2024-06-16 — End: 2024-06-16
  Filled 2024-06-16: qty 10

## 2024-06-16 MED ORDER — SUGAMMADEX SODIUM 200 MG/2ML IV SOLN
INTRAVENOUS | Status: DC | PRN
  Administered 2024-06-16: 200 mg via INTRAVENOUS

## 2024-06-16 MED ORDER — ONDANSETRON HCL 4 MG/2ML IJ SOLN: 4.0000 mg | Freq: Four times a day (QID) | INTRAMUSCULAR | Status: DC | PRN

## 2024-06-16 MED ORDER — SODIUM CHLORIDE 0.9 % IV SOLN
250.0000 mL | INTRAVENOUS | Status: DC | PRN
Start: 2024-06-16 — End: 2024-06-16

## 2024-06-16 MED ORDER — DEXAMETHASONE SODIUM PHOSPHATE 10 MG/ML IJ SOLN
INTRAMUSCULAR | Status: DC | PRN
  Administered 2024-06-16: 10 mg via INTRAVENOUS

## 2024-06-16 MED ORDER — SODIUM CHLORIDE 0.9% FLUSH
3.0000 mL | INTRAVENOUS | Status: DC | PRN
Start: 2024-06-16 — End: 2024-06-16

## 2024-06-16 MED ORDER — ATROPINE SULFATE 1 MG/10ML IJ SOSY
PREFILLED_SYRINGE | INTRAMUSCULAR | Status: AC
  Filled 2024-06-16: qty 10

## 2024-06-16 MED ORDER — FENTANYL CITRATE (PF) 250 MCG/5ML IJ SOLN
INTRAMUSCULAR | Status: DC | PRN
  Administered 2024-06-16: 100 ug via INTRAVENOUS

## 2024-06-16 MED ORDER — ATROPINE SULFATE 1 MG/10ML IJ SOSY
PREFILLED_SYRINGE | INTRAMUSCULAR | Status: DC | PRN
  Administered 2024-06-16: 1 mg via INTRAVENOUS

## 2024-06-16 MED ORDER — PROTAMINE SULFATE 10 MG/ML IV SOLN
INTRAVENOUS | Status: DC | PRN
  Administered 2024-06-16: 40 mg via INTRAVENOUS

## 2024-06-16 MED ORDER — HEPARIN SODIUM (PORCINE) 1000 UNIT/ML IJ SOLN
INTRAMUSCULAR | Status: DC | PRN
Start: 2024-06-16 — End: 2024-06-16
  Administered 2024-06-16: 15000 [IU] via INTRAVENOUS
  Administered 2024-06-16: 5000 [IU] via INTRAVENOUS

## 2024-06-16 MED ORDER — LIDOCAINE 2% (20 MG/ML) 5 ML SYRINGE
INTRAMUSCULAR | Status: DC | PRN
  Administered 2024-06-16: 100 mg via INTRAVENOUS

## 2024-06-16 MED ORDER — ONDANSETRON HCL 4 MG/2ML IJ SOLN
INTRAMUSCULAR | Status: DC | PRN
  Administered 2024-06-16: 4 mg via INTRAVENOUS

## 2024-06-16 MED ORDER — FENTANYL CITRATE (PF) 100 MCG/2ML IJ SOLN
INTRAMUSCULAR | Status: AC
  Filled 2024-06-16: qty 2

## 2024-06-16 MED ORDER — ACETAMINOPHEN 500 MG PO TABS
1000.0000 mg | ORAL_TABLET | Freq: Once | ORAL | Status: AC
  Administered 2024-06-16: 1000 mg via ORAL
  Filled 2024-06-16: qty 2

## 2024-06-16 MED ORDER — PROPOFOL 10 MG/ML IV BOLUS
INTRAVENOUS | Status: DC | PRN
  Administered 2024-06-16: 150 mg via INTRAVENOUS

## 2024-06-16 NOTE — Discharge Instructions (Signed)

## 2024-06-16 NOTE — Transfer of Care (Signed)
 Immediate Anesthesia Transfer of Care Note  Patient: [name]  Procedure(s) Performed: ATRIAL FIBRILLATION ABLATION A-FLUTTER ABLATION  Patient Location: PACU  Anesthesia Type:General  Level of Consciousness: awake, alert , and oriented  Airway & Oxygen Therapy: Patient Spontanous Breathing and Patient connected to nasal cannula oxygen  Post-op Assessment: Report given to RN and Post -op Vital signs reviewed and stable  Post vital signs: stable  Last Vitals:  Vitals Value Taken Time  BP 102/68 06/16/24 09:05  Temp    Pulse 68 06/16/24 09:06  Resp 14 06/16/24 09:06  SpO2 95 % 06/16/24 09:06  Vitals shown include unfiled device data.  Last Pain:  Vitals:   06/16/24 0544  TempSrc:   PainSc: 0-No pain         Complications: There were no known notable events for this encounter.

## 2024-06-16 NOTE — H&P (Signed)
  Electrophysiology Office Note:   Date:  06/16/2024  ID:  BANNER HUCKABA, DOB 07/26/1951, MRN [id number]  Primary Cardiologist: Annabella Scarce, MD Primary Heart Failure: None Electrophysiologist: Farryn Linares Gladis Norton, MD      History of Present Illness:   [name] is a 73 y.o. 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   He presented to the A-fib clinic 02/11/2024 with elevated heart rates.  He was found to be in atrial flutter.  He had a cardioversion.  He Eilene presented for 325 again in atrial flutter and is now post cardioversion 03/11/2024.  He is now on flecainide .  Today, denies symptoms of palpitations, chest pain, dyspnea, orthopnea, PND, lower extremity edema, claudication, dizziness, presyncope, syncope, bleeding, or neurologic sequela. The patient is tolerating medications without difficulties. Plan for ablation today.   EP Information / Studies Reviewed:    EKG is ordered today. Personal review as below.        Risk Assessment/Calculations:    CHA2DS2-VASc Score = 4   This indicates a 4.8% annual risk of stroke. The patient's score is based upon: CHF History: 1 HTN History: 1 Diabetes History: 0 Stroke History: 0 Vascular Disease History: 1 Age Score: 1 Gender Score: 0       STOP-Bang Score:          Physical Exam:   VS:  BP 118/85   Pulse 63   Temp 98.5 F (36.9 C) (Oral)   Resp 20   Ht 6' (1.829 m)   Wt 108 kg   SpO2 96%   BMI 32.28 kg/m    Wt Readings from Last 3 Encounters:  06/16/24 108 kg  03/29/24 110.9 kg  03/11/24 108.9 kg    GEN: No acute distress.   Neck: No JVD Cardiac: RRR, no murmurs, rubs, or gallops.  Respiratory: normal BS bilaterally. GI: Soft, nontender, non-distended  MS: No edema; No deformity. Neuro:  Nonfocal   Skin: warm and dry Psych: Normal affect    ASSESSMENT AND PLAN:    1.  Typical and atypical atrial flutter:Selwyn BURRELL HODAPP has presented today for surgery, with the diagnosis of AF/flutter.  The various methods of treatment have been discussed with the patient and family. After consideration of risks, benefits and other options for treatment, the patient has consented to  Procedure(s): Catheter ablation as a surgical intervention .  Risks include but not limited to complete heart block, stroke, esophageal damage, nerve damage, bleeding, vascular damage, tamponade, perforation, MI, and death. The patient's history has been reviewed, patient examined, no change in status, stable for surgery.  I have reviewed the patient's chart and labs.  Questions were answered to the patient's satisfaction.    Keshan Reha Norton, MD 06/16/2024 7:04 AM

## 2024-06-16 NOTE — Anesthesia Procedure Notes (Signed)
 Procedure Name: Intubation Date/Time: 06/16/2024 7:32 AM  Performed by: Moishe Reyes CROME, CRNAPre-anesthesia Checklist: Patient identified, Emergency Drugs available, Suction available and Patient being monitored Patient Re-evaluated:Patient Re-evaluated prior to induction Oxygen Delivery Method: Circle system utilized Preoxygenation: Pre-oxygenation with 100% oxygen Induction Type: IV induction Ventilation: Mask ventilation without difficulty Laryngoscope Size: Miller and 2 Grade View: Grade I Tube type: Oral Tube size: 7.5 mm Number of attempts: 1 Airway Equipment and Method: Stylet and Oral airway Placement Confirmation: ETT inserted through vocal cords under direct vision, positive ETCO2 and breath sounds checked- equal and bilateral Secured at: 23 cm Tube secured with: Tape Dental Injury: Teeth and Oropharynx as per pre-operative assessment

## 2024-06-17 ENCOUNTER — Telehealth (HOSPITAL_COMMUNITY): Payer: Self-pay

## 2024-06-17 MED FILL — Propofol IV Emul 1000 MG/100ML (10 MG/ML): INTRAVENOUS | Qty: 65 | Status: AC

## 2024-06-17 MED FILL — Fentanyl Citrate Preservative Free (PF) Inj 100 MCG/2ML: INTRAMUSCULAR | Qty: 2 | Status: AC

## 2024-06-17 NOTE — Telephone Encounter (Signed)
 Spoke with patient to complete post procedure follow up call.  Patient reports no complications with groin sites.   Instructions reviewed with patient:  Remove large bandage at puncture site after 24 hours. It is normal to have bruising, tenderness, mild swelling, and a pea or marble sized lump/knot at the groin site which can take up to three months to resolve.  Get help right away if you notice sudden swelling at the puncture site.  Check your puncture site every day for signs of infection: fever, redness, swelling, pus drainage, warmth, foul odor or excessive pain. If this occurs, please call the office at [phone], to speak with the nurse. Get help right away if your puncture site is bleeding and the bleeding does not stop after applying firm pressure to the area.  You may continue to have skipped beats/ atrial fibrillation during the first several months after your procedure.  It is very important not to miss any doses of your blood thinner Eliquis .    You will follow up with the Afib clinic on 07/14/24 and follow up with the Afib clinic on 09/13/24.  Patient verbalized understanding to all instructions provided.

## 2024-06-18 NOTE — Anesthesia Postprocedure Evaluation (Signed)
 Anesthesia Post Note  Patient: [name]  Procedure(s) Performed: ATRIAL FIBRILLATION ABLATION A-FLUTTER ABLATION     Patient location during evaluation: PACU Anesthesia Type: General Level of consciousness: awake and alert Pain management: pain level controlled Vital Signs Assessment: post-procedure vital signs reviewed and stable Respiratory status: spontaneous breathing, nonlabored ventilation and respiratory function stable Cardiovascular status: blood pressure returned to baseline and stable Postop Assessment: no apparent nausea or vomiting Anesthetic complications: no   There were no known notable events for this encounter.  Last Vitals:  Vitals:   06/16/24 1100 06/16/24 1200  BP: 120/78 111/79  Pulse: 65 61  Resp: 18 19  Temp:    SpO2: 96% 95%    Last Pain:  Vitals:   06/16/24 0955  TempSrc:   PainSc: 0-No pain   Pain Goal:                   Garnette FORBES Skillern

## 2024-07-06 DIAGNOSIS — H18413 Arcus senilis, bilateral: Secondary | ICD-10-CM | POA: Diagnosis not present

## 2024-07-06 DIAGNOSIS — H2513 Age-related nuclear cataract, bilateral: Secondary | ICD-10-CM | POA: Diagnosis not present

## 2024-07-06 DIAGNOSIS — H0102A Squamous blepharitis right eye, upper and lower eyelids: Secondary | ICD-10-CM | POA: Diagnosis not present

## 2024-07-06 DIAGNOSIS — H2512 Age-related nuclear cataract, left eye: Secondary | ICD-10-CM | POA: Diagnosis not present

## 2024-07-06 DIAGNOSIS — H25043 Posterior subcapsular polar age-related cataract, bilateral: Secondary | ICD-10-CM | POA: Diagnosis not present

## 2024-07-06 DIAGNOSIS — H25013 Cortical age-related cataract, bilateral: Secondary | ICD-10-CM | POA: Diagnosis not present

## 2024-07-11 ENCOUNTER — Telehealth: Payer: Self-pay

## 2024-07-11 NOTE — Telephone Encounter (Signed)
   Pre-operative Risk Assessment    Patient Name: [name]  DOB: 02-01-1951 MRN: [id number]   Date of last office visit: 03/29/24 WILL CAMNITZ, MD Date of next office visit: NONE   Request for Surgical Clearance    Procedure:  CATARACT EXTRACTION W/ INTRAOCULAR LENS IMPLANTATION OF THE LEFT EYE  Date of Surgery:  Clearance 08/23/24                                Surgeon:  NOT INDICATED Surgeon's Group or Practice Name:  [facility] Phone number:  [phone] Fax number:  [phone]   Type of Clearance Requested:   - Medical  - Pharmacy:  Hold Apixaban  (Eliquis ) (PER REQUEST PT DOES NOT NEED TO STOP ANY MEDICATION)   Type of Anesthesia:  TOPICAL ANESTHESIA W/ IV MEDICATION   Additional requests/questions:    Signed, Lucie DELENA Ku   07/11/2024, 4:04 PM

## 2024-07-13 NOTE — Telephone Encounter (Signed)
   Name: [name]  DOB: 1951-11-15  MRN: [id number]  Primary Cardiologist: Annabella Scarce, MD  Chart reviewed as part of pre-operative protocol coverage. The patient has an upcoming visit scheduled with Dr. Inocencio on 07/14/2024 at which time clearance can be addressed in case there are any issues that would impact surgical recommendations.  CATARACT EXTRACTION W/ INTRAOCULAR LENS IMPLANTATION OF THE LEFT EYE Is not scheduled until 08/23/2024 as below. I added preop FYI to appointment note so that provider is aware to address at time of outpatient visit.  Per office protocol the cardiology provider should forward their finalized clearance decision and recommendations regarding antiplatelet therapy to the requesting party below.    I will route this message as FYI to requesting party and remove this message from the preop box as separate preop APP input not needed at this time.   Please call with any questions.  Lamarr Satterfield, NP  07/13/2024, 8:44 AM

## 2024-07-13 NOTE — Telephone Encounter (Signed)
   Name: GENTLE HOGE  DOB: 1951-02-15  MRN: [id number]  Primary Cardiologist: Annabella Scarce, MD  Chart reviewed as part of pre-operative protocol coverage. Because of Saatvik Thielman Ulloa's past medical history and time since last visit, he will require a follow-up in-office visit in order to better assess preoperative cardiovascular risk.  Pre-op covering staff: - Please schedule appointment and call patient to inform them. If patient already had an upcoming appointment within acceptable timeframe, please add pre-op clearance to the appointment notes so provider is aware. - Please contact requesting surgeon's office via preferred method (i.e, phone, fax) to inform them of need for appointment prior to surgery.   Lamarr Satterfield, NP  07/13/2024, 11:42 AM

## 2024-07-13 NOTE — Telephone Encounter (Signed)
 Pt is scheduled for an appt at the Afib clinic on 8/14 not with Dr. Inocencio. Per Daril Kicks, they do not do cardiac clearances at his office. Please advise. Thank you.

## 2024-07-14 ENCOUNTER — Ambulatory Visit (HOSPITAL_COMMUNITY)
Admission: RE | Admit: 2024-07-14 | Discharge: 2024-07-14 | Disposition: A | Discharge status: Home / Self Care | Source: Ambulatory Visit | Attending: Physician Assistant | Admitting: Physician Assistant

## 2024-07-14 VITALS — BP 122/80 | HR 53 | Ht 72.0 in | Wt 242.6 lb

## 2024-07-14 DIAGNOSIS — I4891 Unspecified atrial fibrillation: Secondary | ICD-10-CM | POA: Diagnosis not present

## 2024-07-14 DIAGNOSIS — I480 Paroxysmal atrial fibrillation: Secondary | ICD-10-CM

## 2024-07-14 DIAGNOSIS — Z5181 Encounter for therapeutic drug level monitoring: Secondary | ICD-10-CM

## 2024-07-14 DIAGNOSIS — D6869 Other thrombophilia: Secondary | ICD-10-CM

## 2024-07-14 DIAGNOSIS — I483 Typical atrial flutter: Secondary | ICD-10-CM | POA: Diagnosis not present

## 2024-07-14 DIAGNOSIS — I484 Atypical atrial flutter: Secondary | ICD-10-CM | POA: Diagnosis not present

## 2024-07-14 DIAGNOSIS — Z79899 Other long term (current) drug therapy: Secondary | ICD-10-CM

## 2024-07-14 NOTE — Telephone Encounter (Signed)
 Patient has been scheduled for IN OFFICE visit

## 2024-07-14 NOTE — Progress Notes (Signed)
 Primary Care Physician: Norleen Lynwood ORN, MD Primary Cardiologist: Dr Raford Primary Electrophysiologist: Dr Inocencio  Referring Physician: [facility]   [name] is a 73 y.o. male with a history of hypertension, hyperlipidemia, impaired glucose tolerance, asthma, DVT following knee replacement, CHF with improved EF, atrial flutter who presents for follow up in the [facility]. The patient was initially diagnosed with atrial flutter 12/22/20 after presenting to the ED with symptoms of progressive SOB and elevated heart rates. ECG showed rapid atrial flutter. He was started on Eliquis  for stroke prevention and metoprolol  for rate control. TEE/DCCV 12/2020.  Echo LVEF less than 20%, global hypokinesis, secundum ASD.  Repeat echo revealed LVEF improved to 50%. Coronary CTA 05/2021 minimal noncalcified plaque as well as aortic atherosclerosis. Echocardiogram 01/03/2022 with normal LVEF 55 to 60%. Seen 08/26/22 in atrial flutter. 09/04/22 underwent TEE/DCCV . He was found to be back in atrial flutter at his follow up on 12/23/22. His BB was increased at that time. Underwent DCCV on 02/05/23  He was in atrial flutter again at his visit 02/11/24, noted persistently elevated rates while exercising. He is s/p DCCV on 02/15/24. He was started on flecainide  as a bridge to ablation. He is s/p afib and flutter ablation with Dr Inocencio on 06/16/24.  Patient returns for follow up for atrial fibrillation and flecainide  monitoring. Patient remains in SR and is feeling well. He denies chest pain or groin issues. No afib episodes detected on his Kardia mobile since ablation.   Today, he  denies symptoms of palpitations, chest pain, shortness of breath, orthopnea, PND, lower extremity edema, dizziness, presyncope, syncope, snoring, daytime somnolence, bleeding, or neurologic sequela. The patient is tolerating medications without difficulties and is otherwise without complaint today.    Atrial  Fibrillation Risk Factors:  he does not have symptoms or diagnosis of sleep apnea. Negative sleep study. he does not have a history of rheumatic fever. he does not have a history of alcohol use. The patient does not have a history of early familial atrial fibrillation or other arrhythmias.   Atrial Fibrillation Management history:  Previous antiarrhythmic drugs: flecainide   Previous cardioversions: 12/2020, 09/04/22, 02/05/23, 02/15/24, 03/11/24 Previous ablations: 06/16/24 Anticoagulation history: Eliquis    Past Medical History:  Diagnosis Date   ALLERGIC RHINITIS 12/31/2007   Aortic atherosclerosis (HCC) 08/26/2022   Arthritis    Blood transfusion without reported diagnosis    Chronic combined systolic and diastolic heart failure (HCC) 06/14/2021   COUGH, CHRONIC 07/18/2007   DEGENERATIVE JOINT DISEASE 07/18/2007   Extrinsic asthma, unspecified 09/08/2009   GERD 07/18/2007   HYPERLIPIDEMIA 08/14/2010   Hypertension    Impaired glucose tolerance 09/23/2011   OBESITY 07/18/2007   Pneumonia     Current Outpatient Medications  Medication Sig Dispense Refill   acetaminophen  (TYLENOL ) 650 MG CR tablet Take 650 mg by mouth in the morning.     albuterol  (PROAIR  HFA) 108 (90 Base) MCG/ACT inhaler Inhale 2 puffs into the lungs every 6 (six) hours as needed for wheezing or shortness of breath. 24 g 3   budesonide -formoterol  (SYMBICORT ) 80-4.5 MCG/ACT inhaler Inhale 2 puffs into the lungs 2 (two) times daily. 3 each 3   ELIQUIS  5 MG TABS tablet TAKE 1 TABLET BY MOUTH TWICE A DAY 180 tablet 1   esomeprazole  (NEXIUM ) 40 MG capsule Take 1 capsule (40 mg total) by mouth daily at 12 noon. Take 1 Daily (Patient taking differently: Take 40 mg by mouth as needed (Heartburm).)  90 capsule 3   flecainide  (TAMBOCOR ) 50 MG tablet TAKE 1 TABLET BY MOUTH TWICE A DAY 180 tablet 1   Krill Oil 500 MG CAPS Take 500 mg by mouth daily.     melatonin 5 MG TABS Take 5 mg by mouth at bedtime.     metoprolol   succinate (TOPROL -XL) 25 MG 24 hr tablet TAKE 1.5 TABLETS BY MOUTH DAILY. 135 tablet 3   Multiple Vitamins-Minerals (CENTRUM ADULTS PO) Take 1 tablet by mouth 3 (three) times a week.     rosuvastatin  (CRESTOR ) 20 MG tablet TAKE 1 TABLET BY MOUTH EVERY DAY IN THE EVENING 90 tablet 2   VEVYE 0.1 % SOLN Place 1 drop into both eyes 2 (two) times daily.     No current facility-administered medications for this encounter.    ROS- All systems are reviewed and negative except as per the HPI above.  Physical Exam: Vitals:   07/14/24 1129  BP: 122/80  Pulse: (!) 53  Weight: 110 kg  Height: 6' (1.829 m)    GEN: Well nourished, well developed in no acute distress CARDIAC: Regular rate and rhythm, no murmurs, rubs, gallops RESPIRATORY:  Clear to auscultation without rales, wheezing or rhonchi  ABDOMEN: Soft, non-tender, non-distended EXTREMITIES:  No edema; No deformity    Wt Readings from Last 3 Encounters:  07/14/24 110 kg  06/16/24 108 kg  03/29/24 110.9 kg    EKG today demonstrates  SB, 1st degree AV block Vent. rate 53 BPM PR interval 260 ms QRS duration 98 ms QT/QTcB 442/414 ms   Echo 01/07/21  1. Left ventricular ejection fraction, by estimation, is 55 to 60%. The  left ventricle has normal function. The left ventricle has no regional  wall motion abnormalities. There is mild concentric left ventricular  hypertrophy of the septal segment. Left ventricular diastolic parameters are consistent with Grade I diastolic dysfunction (impaired relaxation). The average left ventricular global longitudinal strain is 19.8 %. The global longitudinal strain is normal.   2. Right ventricular systolic function is normal. The right ventricular  size is normal. There is normal pulmonary artery systolic pressure.   3. Left atrial size was mildly dilated.   4. Right atrial size was mildly dilated.   5. The mitral valve is normal in structure. No evidence of mitral valve  regurgitation. No  evidence of mitral stenosis.   6. The aortic valve is tricuspid. Aortic valve regurgitation is trivial.  No aortic stenosis is present.   7. Aortic dilatation noted. There is moderate dilatation of the aortic  root, measuring 46 mm. There is moderate dilatation of the ascending  aorta, measuring 44 mm.   8. The inferior vena cava is normal in size with greater than 50%  respiratory variability, suggesting right atrial pressure of 3 mmHg.    Epic records are reviewed at length today  CHA2DS2-VASc Score = 4  The patient's score is based upon: CHF History: 1 HTN History: 1 Diabetes History: 0 Stroke History: 0 Vascular Disease History: 1 Age Score: 1 Gender Score: 0       ASSESSMENT AND PLAN: Paroxysmal Atrial Fibrillation/typical and atypical atrial flutter (ICD10:  I48.0) The patient's CHA2DS2-VASc score is 4, indicating a 4.8% annual risk of stroke.   S/p afib and flutter ablation 06/16/24 Patient appears to be maintaining SR Continue flecainide  50 mg BID for now Continue Eliquis  5 mg BID with no missed doses for 3 months post ablation.  Continue Toprol  37.5 mg daily Kardia mobile for home  monitoring.   Secondary Hypercoagulable State (ICD10:  D68.69) The patient is at significant risk for stroke/thromboembolism based upon his CHA2DS2-VASc Score of 4.  Continue Apixaban  (Eliquis ). No bleeding issues.   High Risk Medication Monitoring (ICD 10: Z79.899) Intervals on ECG acceptable for flecainide  monitoring. Would not increase dose with prolonged PR.   HFrecEF EF 55-60% GDMT per primary cardiology team Fluid status appears stable today  HTN Stable on current regimen    Follow up in the AF clinic in 2 months.     Daril Kicks PA-C Afib Clinic [facility] [address] [place], [place] [address] [phone] 07/14/2024 11:54 AM

## 2024-08-03 DIAGNOSIS — L570 Actinic keratosis: Secondary | ICD-10-CM | POA: Diagnosis not present

## 2024-08-03 DIAGNOSIS — L821 Other seborrheic keratosis: Secondary | ICD-10-CM | POA: Diagnosis not present

## 2024-08-03 DIAGNOSIS — D225 Melanocytic nevi of trunk: Secondary | ICD-10-CM | POA: Diagnosis not present

## 2024-08-03 DIAGNOSIS — B079 Viral wart, unspecified: Secondary | ICD-10-CM | POA: Diagnosis not present

## 2024-08-03 DIAGNOSIS — D492 Neoplasm of unspecified behavior of bone, soft tissue, and skin: Secondary | ICD-10-CM | POA: Diagnosis not present

## 2024-08-03 DIAGNOSIS — L814 Other melanin hyperpigmentation: Secondary | ICD-10-CM | POA: Diagnosis not present

## 2024-08-03 NOTE — Progress Notes (Signed)
 " Cardiology Office Note   Date:  08/04/2024  ID:  [name], DOB [birth year], MRN [id number] PCP: Norleen Lynwood ORN, MD  [facility] Providers Cardiologist:  Annabella Scarce, MD Electrophysiologist:  Soyla Gladis Norton, MD     [place] Atrial fibrillation/flutter on chronic anticoagulation Hypertension Ascending aortic aneurysm Chronic HFpEF, recovered LVEF Hyperlipidemia Obesity Aortic atherosclerosis Coronary CTA 05/2021 with calcium  score of 0  Initially seen in A-fib clinic 12/2020 for atrial flutter.  He was started on Eliquis  and metoprolol  and underwent TEE/DCCV 12/2020.  Echo revealed LVEF less than 20% with global hypokinesis and a secundum ASD. EF recovered to 50% on echo 01/2021.  Coronary CTA 05/2021 showed minimal noncalcified plaque, calcium  score of 0, and aortic atherosclerosis.  Aorta was dilated at 3.9 cm.  Echo 01/2022 revealed LVEF 55 to 60%, mild LVH, grade 1 diastolic dysfunction.  Aortic root was 4.6 cm and the ascending aorta was 4.4 cm.  At office visit 08/2022 he was in atrial flutter and was feeling tired.  Rate was well-controlled on metoprolol .  He was referred for TEE/DCCV 08/2022.  LVEF was 60 to 65% and he was found to have small PFO.  He was successfully cardioverted.  He underwent sleep study that was negative for OSA.    He has been followed in the A-fib clinic.  He underwent repeat cardioversion 03/11/24 and was placed on flecainide .  Seen by Dr. Norton 03/29/2024 reporting no further episodes of atrial fibrillation.  EKG revealed sinus bradycardia at 58 bpm.  He was maintained on flecainide  50 mg twice daily and Toprol  XL.  Despite maintaining sinus rhythm, he wanted to avoid long-term antiarrhythmic medication and was interested in ablation.  He underwent atrial flutter ablation on 06/16/2024 with Dr. Norton.  He was in sinus rhythm at follow-up with A Fib clinic on 07/14/24.  He was advised to continue Eliquis  5 mg twice daily for 3 months post ablation  and to continue flecainide  50 mg twice daily and Toprol -XL 37.5 mg daily for now.  History of Present Illness Discussed the use of AI scribe software for clinical note transcription with the patient, who gave verbal consent to proceed.  History of Present Illness [name] is a very pleasant 73 year old male who presents today for preoperative cardiac evaluation.  He reports he is feeling well but experiences increased fatigue since the ablation procedure, which he partially attributes to age and hydration. He maintains a physical activity routine, attending the [facility] once a week, performing 15 minutes each on the elliptical and treadmill, and 15-20 minutes on the stationary bike, along with weight training. Previously, he exercised more frequently. His blood pressure is well-controlled. He recalls a past concern with his heart rate reaching 140 bpm during exercise, which led to treatment with flecainide  and multiple cardioversions and ultimate ablation.  He denies chest pain, shortness of breath, orthopnea, PND, edema, presyncope, syncope.   ROS: See HPI  Studies Reviewed EKG Interpretation Date/Time:  Thursday August 04 2024 09:15:05 EDT Ventricular Rate:  54 PR Interval:  264 QRS Duration:  100 QT Interval:  456 QTC Calculation: 432 R Axis:   -62  Text Interpretation: Sinus bradycardia with 1st degree A-V block Left axis deviation When compared with ECG of 14-Jul-2024 11:40, No significant change was found Confirmed by Percy Browning [id number]) on 08/04/2024 9:46:05 AM     No results found for: LIPOA  Risk Assessment/Calculations  CHA2DS2-VASc Score = 4   This indicates a 4.8% annual  risk of stroke. The patient's score is based upon: CHF History: 1 HTN History: 1 Diabetes History: 0 Stroke History: 0 Vascular Disease History: 1 Age Score: 1 Gender Score: 0        STOP-Bang Score:         Physical Exam VS:  BP 120/80 (BP Location: Left Arm, Patient Position:  Sitting, Cuff Size: Large)   Pulse (!) 54   Ht 6' (1.829 m)   Wt 240 lb 11.2 oz (109.2 kg)   SpO2 95%   BMI 32.64 kg/m    Wt Readings from Last 3 Encounters:  08/04/24 240 lb 11.2 oz (109.2 kg)  07/14/24 242 lb 9.6 oz (110 kg)  06/16/24 238 lb (108 kg)    GEN: Well nourished, well developed in no acute distress NECK: No JVD; No carotid bruits CARDIAC: RRR, no murmurs, rubs, gallops RESPIRATORY:  Clear to auscultation without rales, wheezing or rhonchi  ABDOMEN: Soft, non-tender, non-distended EXTREMITIES:  No edema; No deformity    Assessment & Plan Preoperative cardiac evaluation He is scheduled to undergo cataract extraction which is a low risk procedure.According to the Revised Cardiac Risk Index (RCRI), his Perioperative Risk of Major Cardiac Event is (%): 0.4. His Functional Capacity in METs is: 8.23 according to the Duke Activity Status Index (DASI). The patient is doing well from a cardiac perspective. Therefore, based on ACC/AHA guidelines, the patient would be at acceptable risk for the planned procedure without further cardiovascular testing.  He does not need to hold Eliquis  for cataract surgery.  I will forward clearance to requesting provider.  Persistent atrial fibrillation status post ablation  First degree AV block Sinus bradycardia  Chronic anticoagulation EKG today reveals sinus bradycardia at 54 bpm, first-degree AV block. He is status post ablation for atrial fibrillation and continues on Eliquis  5 mg twice daily.  He is not having any symptoms concerning for return of A-fib. He reports he is feeling well but continues to be somewhat fatigued but attributes this to age. No dizziness, presyncope, syncope. -Consider Reducing Toprol  XL dose at next A Fib clinic visit  Cardiac risk Coronary calcium  score of 0 on CT 06/10/2024.  He reports fatigue, but denies chest pain, dyspnea, or other symptoms concerning for angina.  No indication for further ischemic evaluation at  this time. We discussed the importance of healthy diet and regular exercise.  He is exercising consistently at the [facility]. - Continue regular exercise - Aim to eat a heart healthy, mostly whole food diet avoiding processed food, saturated fat, sugar, and other simple carbohydrates        Dispo: Oct f/u with A-fib clinic/1 year with Dr. Raford or APP  Signed, Rosaline Bane, NP-C "

## 2024-08-04 ENCOUNTER — Encounter (HOSPITAL_BASED_OUTPATIENT_CLINIC_OR_DEPARTMENT_OTHER): Payer: Self-pay | Admitting: Nurse Practitioner

## 2024-08-04 ENCOUNTER — Ambulatory Visit (HOSPITAL_BASED_OUTPATIENT_CLINIC_OR_DEPARTMENT_OTHER): Admitting: Nurse Practitioner

## 2024-08-04 VITALS — BP 120/80 | HR 54 | Ht 72.0 in | Wt 240.7 lb

## 2024-08-04 DIAGNOSIS — I440 Atrioventricular block, first degree: Secondary | ICD-10-CM

## 2024-08-04 DIAGNOSIS — D6869 Other thrombophilia: Secondary | ICD-10-CM | POA: Diagnosis not present

## 2024-08-04 DIAGNOSIS — Z9889 Other specified postprocedural states: Secondary | ICD-10-CM

## 2024-08-04 DIAGNOSIS — Z7189 Other specified counseling: Secondary | ICD-10-CM

## 2024-08-04 DIAGNOSIS — I480 Paroxysmal atrial fibrillation: Secondary | ICD-10-CM

## 2024-08-04 DIAGNOSIS — I484 Atypical atrial flutter: Secondary | ICD-10-CM | POA: Diagnosis not present

## 2024-08-04 DIAGNOSIS — R001 Bradycardia, unspecified: Secondary | ICD-10-CM

## 2024-08-04 DIAGNOSIS — Z8679 Personal history of other diseases of the circulatory system: Secondary | ICD-10-CM | POA: Diagnosis not present

## 2024-08-04 DIAGNOSIS — Z01810 Encounter for preprocedural cardiovascular examination: Secondary | ICD-10-CM

## 2024-08-04 NOTE — Patient Instructions (Signed)
 Medication Instructions:  Your physician recommends that you continue on your current medications as directed. Please refer to the Current Medication list given to you today.   *If you need a refill on your cardiac medications before your next appointment, please call your pharmacy*  Lab Work: NONE  Testing/Procedures: NONE  Follow-Up: At [facility], you and your health needs are our priority.  As part of our continuing mission to provide you with exceptional heart care, our providers are all part of one team.  This team includes your primary Cardiologist (physician) and Advanced Practice Providers or APPs (Physician Assistants and Nurse Practitioners) who all work together to provide you with the care you need, when you need it.  Your next appointment:   12 month(s)  Provider:   Rosaline Bane, NP     We recommend signing up for the patient portal called MyChart.  Sign up information is provided on this After Visit Summary.  MyChart is used to connect with patients for Virtual Visits (Telemedicine).  Patients are able to view lab/test results, encounter notes, upcoming appointments, etc.  Non-urgent messages can be sent to your provider as well.   To learn more about what you can do with MyChart, go to [email or web address].

## 2024-08-04 NOTE — Telephone Encounter (Signed)
 2nd request received. Pt has IN OFFICE appt 08/04/24 with Rosaline Bane, NP  Will route to Preop APP to review.

## 2024-08-04 NOTE — Telephone Encounter (Signed)
 Hey, just a reminder that pt does not need clearance for cataract surgery. Very nice man, did not mind seeing him at all, but he didn't really need the appt.      Patient Name: [name]  DOB: [birth year] MRN: [id number]  Primary Cardiologist: Annabella Scarce, MD  Chart reviewed as part of pre-operative protocol coverage. Cataract extractions are recognized in guidelines as low risk surgeries that do not typically require specific preoperative testing or holding of blood thinner therapy. Therefore, given past medical history and time since last visit, based on ACC/AHA guidelines, [name] would be at acceptable risk for the planned procedure without further cardiovascular testing.   I will route this recommendation to the requesting party via Epic fax function and remove from pre-op pool.  Please call with questions.  Keyler Hoge, Rosaline CHRISTELLA, NP 08/04/2024, 1:10 PM

## 2024-08-05 NOTE — Telephone Encounter (Signed)
   Patient Name: [name]  DOB: 1951/10/11 MRN: [id number]  Primary Cardiologist: Annabella Scarce, MD  Chart reviewed as part of pre-operative protocol coverage. Cataract extractions are recognized in guidelines as low risk surgeries that do not typically require specific preoperative testing or holding of blood thinner therapy. Therefore, given past medical history and time since last visit, based on ACC/AHA guidelines, [name] would be at acceptable risk for the planned procedure without further cardiovascular testing.   I will route this recommendation to the requesting party via Epic fax function and remove from pre-op pool.  Please call with questions.  Lamarr Satterfield, NP 08/05/2024, 7:24 AM

## 2024-08-18 ENCOUNTER — Ambulatory Visit: Admitting: Orthopedic Surgery

## 2024-08-18 DIAGNOSIS — M65352 Trigger finger, left little finger: Secondary | ICD-10-CM | POA: Diagnosis not present

## 2024-08-18 MED ORDER — BETAMETHASONE SOD PHOS & ACET 6 (3-3) MG/ML IJ SUSP
6.0000 mg | INTRAMUSCULAR | Status: AC | PRN
  Administered 2024-08-18: 6 mg via INTRA_ARTICULAR

## 2024-08-18 MED ORDER — LIDOCAINE HCL 1 % IJ SOLN
1.0000 mL | INTRAMUSCULAR | Status: AC | PRN
  Administered 2024-08-18: 1 mL

## 2024-08-18 NOTE — Progress Notes (Signed)
 [name] - 73 y.o. male MRN [id number]  Date of birth: [birth year]  Office Visit Note: Visit Date: 08/18/2024 PCP: Norleen Lynwood ORN, MD Referred by: Norleen Lynwood ORN, MD  Subjective: No chief complaint on file.  HPI: [name] is a pleasant 73 y.o. male who presents today for specific and surgical hand surgical evaluation for ongoing left small finger trigger digit that is been present now for multiple months.  He is experiencing ongoing clicking and locking of the digit, often requiring manual correction.  Has not undergone any prior workup or treatment for this.  Does have a history of prior right ring finger trigger digit that underwent injection years prior with improvement of symptoms.  Pertinent ROS were reviewed with the patient and found to be negative unless otherwise specified above in HPI.   Visit Reason: left little finger trigger Duration of symptoms: 2 months Hand dominance: Left Occupation: retired Diabetic: No Smoking: No Heart/Lung History: Cardiovascular and Mediastinum HTN (hypertension) DVT (deep venous thrombosis) (HCC) Atrial flutter (HCC) Blood Thinners: Eliquis   Prior Testing/EMG: none Injections (Date): none Treatments: none Prior Surgery: none  Assessment & Plan: Visit Diagnoses:  1. Trigger little finger of left hand     Plan: Extensive discussion was had with the patient today regarding his left small finger trigger digit.  We discussed the etiology and pathophysiology of stenosing tenosynovitis.  We discussed conservative versus surgical treatment modalities.  From a conservative standpoint, we discussed activity modification, splinting, therapy and injections.  From a surgical standpoint, we discussed the possibility for trigger digit release as well as all risk and benefits associated.  Given that he has not trialed conservative treatments, patient is appropriate candidate for cortisone injection to the left small finger A1 pulley for  symptom relief.  Risks and benefit of the cortisone injection were discussed in detail, patient agreed to proceed.  Injection was provided today without issue, patient will return in approximate 6 weeks time for a recheck.   Follow-up: No follow-ups on file.   Meds & Orders: No orders of the defined types were placed in this encounter.   Orders Placed This Encounter  Procedures   Hand/UE Inj     Procedures: Hand/UE Inj: L small A1 for trigger finger on 08/18/2024 8:28 PM Indications: pain Details: 25 G needle, volar approach Medications: 1 mL lidocaine  1 %; 6 mg betamethasone  acetate-betamethasone  sodium phosphate  6 (3-3) MG/ML Outcome: tolerated well, no immediate complications Consent was given by the patient. Patient was prepped and draped in the usual sterile fashion.          Clinical History: No specialty comments available.  He reports that he has never smoked. He has never used smokeless tobacco.  Recent Labs    09/18/23 0936  HGBA1C 6.0    Objective:   Vital Signs: There were no vitals taken for this visit.  Physical Exam  Gen: Well-appearing, in no acute distress; non-toxic CV: Regular Rate. Well-perfused. Warm.  Resp: Breathing unlabored on room air; no wheezing. Psych: Fluid speech in conversation; appropriate affect; normal thought process  Ortho Exam Left hand: - Notable tenderness over the small finger A1 pulley, observable clicking of the digit with deep flexion - Hand is warm well-perfused  Imaging: No results found.  Past Medical/Family/Surgical/Social History: Medications & Allergies reviewed per EMR, new medications updated. Patient Active Problem List   Diagnosis Date Noted   Encounter for well adult exam with abnormal findings 09/18/2023   CKD (chronic kidney  disease) stage 3, GFR 30-59 ml/min (HCC) 09/18/2023   Aortic atherosclerosis (HCC) 08/26/2022   Partial intestinal obstruction (HCC) 04/07/2022   Chronic combined systolic and  diastolic heart failure (HCC) 06/14/2021   Atrial flutter (HCC) 12/24/2020   Secondary hypercoagulable state (HCC) 12/24/2020   Tachycardia 12/21/2020   DOE (dyspnea on exertion) 12/21/2020   Anemia due to blood loss, acute 07/29/2018    Class: Acute   DVT (deep venous thrombosis) (HCC) 07/28/2018   Unilateral primary osteoarthritis, right knee 07/26/2018    Class: End Stage   S/P TKR (total knee replacement) using cement, right 07/26/2018   Flu-like symptoms 01/23/2018   Dyspnea 01/07/2018   HTN (hypertension) 10/30/2016   Cough 10/30/2016   Asthma with exacerbation 10/30/2016   Edema 11/22/2015   Erectile dysfunction 01/17/2015   Corneal abrasion, right, sequela 06/27/2014   Preventative health care 09/23/2011   Impaired glucose tolerance 09/23/2011   Hyperlipidemia 08/14/2010   Extrinsic asthma 09/08/2009   ALLERGIC RHINITIS 12/31/2007   OBESITY 07/18/2007   GERD 07/18/2007   DEGENERATIVE JOINT DISEASE 07/18/2007   Past Medical History:  Diagnosis Date   ALLERGIC RHINITIS 12/31/2007   Aortic atherosclerosis (HCC) 08/26/2022   Arthritis    Blood transfusion without reported diagnosis    Chronic combined systolic and diastolic heart failure (HCC) 06/14/2021   COUGH, CHRONIC 07/18/2007   DEGENERATIVE JOINT DISEASE 07/18/2007   Extrinsic asthma, unspecified 09/08/2009   GERD 07/18/2007   HYPERLIPIDEMIA 08/14/2010   Hypertension    Impaired glucose tolerance 09/23/2011   OBESITY 07/18/2007   Pneumonia    Family History  Problem Relation Age of Onset   Lupus Father    Cerebral palsy Sister    Diabetes Maternal Grandmother    Colon cancer Neg Hx    Esophageal cancer Neg Hx    Rectal cancer Neg Hx    Stomach cancer Neg Hx    Past Surgical History:  Procedure Laterality Date   A-FLUTTER ABLATION N/A 06/16/2024   Procedure: A-FLUTTER ABLATION;  Surgeon: Inocencio Soyla Lunger, MD;  Location: [facility] INVASIVE CV LAB;  Service: Cardiovascular;  Laterality: N/A;   ATRIAL  FIBRILLATION ABLATION N/A 06/16/2024   Procedure: ATRIAL FIBRILLATION ABLATION;  Surgeon: Inocencio Soyla Lunger, MD;  Location: [facility] INVASIVE CV LAB;  Service: Cardiovascular;  Laterality: N/A;   CARDIOVERSION N/A 12/28/2020   Procedure: CARDIOVERSION;  Surgeon: Raford Riggs, MD;  Location: [facility] ENDOSCOPY;  Service: Cardiovascular;  Laterality: N/A;   CARDIOVERSION N/A 09/04/2022   Procedure: CARDIOVERSION;  Surgeon: Jeffrie Oneil BROCKS, MD;  Location: [facility] ENDOSCOPY;  Service: Cardiovascular;  Laterality: N/A;   CARDIOVERSION N/A 02/05/2023   Procedure: CARDIOVERSION;  Surgeon: Lonni Slain, MD;  Location: [facility] ENDOSCOPY;  Service: Cardiovascular;  Laterality: N/A;   CARDIOVERSION N/A 02/15/2024   Procedure: CARDIOVERSION;  Surgeon: Alvan Ronal BRAVO, MD;  Location: [facility] INVASIVE CV LAB;  Service: Cardiovascular;  Laterality: N/A;   CARDIOVERSION N/A 03/11/2024   Procedure: CARDIOVERSION;  Surgeon: Francyne Headland, MD;  Location: [facility] INVASIVE CV LAB;  Service: Cardiovascular;  Laterality: N/A;   COLONOSCOPY     DG FEMUR LEFT  ([facility] HX)     rod placed   LUMBAR DISC SURGERY     TEE WITHOUT CARDIOVERSION N/A 12/28/2020   Procedure: TRANSESOPHAGEAL ECHOCARDIOGRAM (TEE);  Surgeon: Raford Riggs, MD;  Location: [facility] ENDOSCOPY;  Service: Cardiovascular;  Laterality: N/A;   TEE WITHOUT CARDIOVERSION N/A 09/04/2022   Procedure: TRANSESOPHAGEAL ECHOCARDIOGRAM (TEE);  Surgeon: Jeffrie Oneil BROCKS, MD;  Location: [facility] ENDOSCOPY;  Service: Cardiovascular;  Laterality: N/A;   TOOTH EXTRACTION     TOTAL KNEE ARTHROPLASTY Right 07/26/2018   Procedure: RIGHT TOTAL KNEE ARTHROPLASTY;  Surgeon: Lucilla Lynwood BRAVO, MD;  Location: [facility] OR;  Service: Orthopedics;  Laterality: Right;   TRANSESOPHAGEAL ECHOCARDIOGRAM (CATH LAB) N/A 02/15/2024   Procedure: TRANSESOPHAGEAL ECHOCARDIOGRAM;  Surgeon: Alvan Ronal BRAVO, MD;  Location: [facility] INVASIVE CV LAB;  Service: Cardiovascular;  Laterality: N/A;   Social History   Occupational History    Occupation: Administrator  Tobacco Use   Smoking status: Never   Smokeless tobacco: Never   Tobacco comments:    Marijuana 30 years  Vaping Use   Vaping status: Never Used  Substance and Sexual Activity   Alcohol use: Yes    Alcohol/week: 2.0 - 4.0 standard drinks of alcohol    Types: 2 - 4 Cans of beer per week    Comment: moderate   Drug use: Not Currently    Comment: as a teenager   Sexual activity: Not on file    Margaux Engen Estela) Arlinda, M.D. [facility], Hand Surgery

## 2024-08-19 ENCOUNTER — Other Ambulatory Visit: Payer: Self-pay | Admitting: Cardiovascular Disease

## 2024-08-19 DIAGNOSIS — I4892 Unspecified atrial flutter: Secondary | ICD-10-CM

## 2024-08-19 DIAGNOSIS — I824Y1 Acute embolism and thrombosis of unspecified deep veins of right proximal lower extremity: Secondary | ICD-10-CM

## 2024-08-19 NOTE — Telephone Encounter (Signed)
 Prescription refill request for Eliquis  received. Indication:afib Last office visit:9/25 Scr:1.35  7/25 Age: 73 Weight:109.2  kg  Prescription refilled

## 2024-08-23 DIAGNOSIS — Z961 Presence of intraocular lens: Secondary | ICD-10-CM | POA: Diagnosis not present

## 2024-08-23 DIAGNOSIS — H2512 Age-related nuclear cataract, left eye: Secondary | ICD-10-CM | POA: Diagnosis not present

## 2024-08-23 DIAGNOSIS — H268 Other specified cataract: Secondary | ICD-10-CM | POA: Diagnosis not present

## 2024-08-23 DIAGNOSIS — H25032 Anterior subcapsular polar age-related cataract, left eye: Secondary | ICD-10-CM | POA: Diagnosis not present

## 2024-08-23 DIAGNOSIS — I10 Essential (primary) hypertension: Secondary | ICD-10-CM | POA: Diagnosis not present

## 2024-09-06 DIAGNOSIS — I10 Essential (primary) hypertension: Secondary | ICD-10-CM | POA: Diagnosis not present

## 2024-09-06 DIAGNOSIS — H268 Other specified cataract: Secondary | ICD-10-CM | POA: Diagnosis not present

## 2024-09-06 DIAGNOSIS — H2511 Age-related nuclear cataract, right eye: Secondary | ICD-10-CM | POA: Diagnosis not present

## 2024-09-13 ENCOUNTER — Ambulatory Visit (HOSPITAL_COMMUNITY)
Admission: RE | Admit: 2024-09-13 | Discharge: 2024-09-13 | Disposition: A | Discharge status: Home / Self Care | Source: Ambulatory Visit | Attending: Physician Assistant | Admitting: Physician Assistant

## 2024-09-13 VITALS — BP 100/62 | HR 65 | Ht 72.0 in | Wt 242.6 lb

## 2024-09-13 DIAGNOSIS — I480 Paroxysmal atrial fibrillation: Secondary | ICD-10-CM | POA: Diagnosis not present

## 2024-09-13 DIAGNOSIS — D6869 Other thrombophilia: Secondary | ICD-10-CM

## 2024-09-13 DIAGNOSIS — I4891 Unspecified atrial fibrillation: Secondary | ICD-10-CM

## 2024-09-13 NOTE — Addendum Note (Signed)
 Encounter addended by: Franchot Glade RAMAN, RN on: 09/13/2024 11:34 AM  Actions taken: Order list changed, Medication long-term status modified

## 2024-09-13 NOTE — Progress Notes (Signed)
 Primary Care Physician: Norleen Lynwood ORN, MD Primary Cardiologist: Dr Raford Primary Electrophysiologist: Dr Inocencio  Referring Physician: [facility]   [name] is a 73 y.o. male with a history of hypertension, hyperlipidemia, impaired glucose tolerance, asthma, DVT following knee replacement, CHF with improved EF, atrial flutter who presents for follow up in the [facility]. The patient was initially diagnosed with atrial flutter 12/22/20 after presenting to the ED with symptoms of progressive SOB and elevated heart rates. ECG showed rapid atrial flutter. He was started on Eliquis  for stroke prevention and metoprolol  for rate control. TEE/DCCV 12/2020.  Echo LVEF less than 20%, global hypokinesis, secundum ASD.  Repeat echo revealed LVEF improved to 50%. Coronary CTA 05/2021 minimal noncalcified plaque as well as aortic atherosclerosis. Echocardiogram 01/03/2022 with normal LVEF 55 to 60%. Seen 08/26/22 in atrial flutter. 09/04/22 underwent TEE/DCCV . He was found to be back in atrial flutter at his follow up on 12/23/22. His BB was increased at that time. Underwent DCCV on 02/05/23  He was in atrial flutter again at his visit 02/11/24, noted persistently elevated rates while exercising. He is s/p DCCV on 02/15/24. He was started on flecainide  as a bridge to ablation. He is s/p afib and flutter ablation with Dr Inocencio on 06/16/24.  Patient returns for follow up for atrial fibrillation. He remains in SR today and feels well. He denies any interim episodes of afib. He has a Optician, dispensing mobile device to monitor at home. No bleeding issues on anticoagulation.   Today, he  denies symptoms of palpitations, chest pain, shortness of breath, orthopnea, PND, lower extremity edema, dizziness, presyncope, syncope, snoring, daytime somnolence, bleeding, or neurologic sequela. The patient is tolerating medications without difficulties and is otherwise without complaint today.    Atrial  Fibrillation Risk Factors:  he does not have symptoms or diagnosis of sleep apnea. Negative sleep study. he does not have a history of rheumatic fever. he does not have a history of alcohol use. The patient does not have a history of early familial atrial fibrillation or other arrhythmias.   Atrial Fibrillation Management history:  Previous antiarrhythmic drugs: flecainide   Previous cardioversions: 12/2020, 09/04/22, 02/05/23, 02/15/24, 03/11/24 Previous ablations: 06/16/24 Anticoagulation history: Eliquis    Past Medical History:  Diagnosis Date   ALLERGIC RHINITIS 12/31/2007   Aortic atherosclerosis 08/26/2022   Arthritis    Blood transfusion without reported diagnosis    Chronic combined systolic and diastolic heart failure (HCC) 06/14/2021   COUGH, CHRONIC 07/18/2007   DEGENERATIVE JOINT DISEASE 07/18/2007   Extrinsic asthma, unspecified 09/08/2009   GERD 07/18/2007   HYPERLIPIDEMIA 08/14/2010   Hypertension    Impaired glucose tolerance 09/23/2011   OBESITY 07/18/2007   Pneumonia     Current Outpatient Medications  Medication Sig Dispense Refill   acetaminophen  (TYLENOL ) 650 MG CR tablet Take 650 mg by mouth in the morning.     albuterol  (PROAIR  HFA) 108 (90 Base) MCG/ACT inhaler Inhale 2 puffs into the lungs every 6 (six) hours as needed for wheezing or shortness of breath. 24 g 3   BESIVANCE 0.6 % SUSP Apply 1 drop to eye 3 (three) times daily. (Patient taking differently: Apply 1 drop to eye 2 (two) times daily.)     budesonide -formoterol  (SYMBICORT ) 80-4.5 MCG/ACT inhaler Inhale 2 puffs into the lungs 2 (two) times daily. (Patient taking differently: Inhale 2 puffs into the lungs as needed.) 3 each 3   ELIQUIS  5 MG TABS tablet TAKE 1 TABLET  BY MOUTH TWICE A DAY 180 tablet 1   esomeprazole  (NEXIUM ) 40 MG capsule Take 1 capsule (40 mg total) by mouth daily at 12 noon. Take 1 Daily 90 capsule 3   flecainide  (TAMBOCOR ) 50 MG tablet TAKE 1 TABLET BY MOUTH TWICE A DAY 180 tablet  1   Krill Oil 500 MG CAPS Take 500 mg by mouth daily.     melatonin 5 MG TABS Take 5 mg by mouth at bedtime.     metoprolol  succinate (TOPROL -XL) 25 MG 24 hr tablet TAKE 1.5 TABLETS BY MOUTH DAILY. 135 tablet 3   Multiple Vitamins-Minerals (CENTRUM ADULTS PO) Take 1 tablet by mouth 3 (three) times a week.     rosuvastatin  (CRESTOR ) 20 MG tablet TAKE 1 TABLET BY MOUTH EVERY DAY IN THE EVENING 90 tablet 2   VEVYE 0.1 % SOLN Place 1 drop into both eyes 2 (two) times daily. (Patient not taking: Reported on 09/13/2024)     No current facility-administered medications for this encounter.    ROS- All systems are reviewed and negative except as per the HPI above.  Physical Exam: Vitals:   09/13/24 1057  BP: 100/62  Pulse: 65  Weight: 110 kg  Height: 6' (1.829 m)    GEN: Well nourished, well developed in no acute distress CARDIAC: Regular rate and rhythm, no murmurs, rubs, gallops RESPIRATORY:  Clear to auscultation without rales, wheezing or rhonchi  ABDOMEN: Soft, non-tender, non-distended EXTREMITIES:  No edema; No deformity    Wt Readings from Last 3 Encounters:  09/13/24 110 kg  08/04/24 109.2 kg  07/14/24 110 kg    EKG today demonstrates  SR, 1st degree AV block, LAFB Vent. rate 65 BPM PR interval 246 ms QRS duration 98 ms QT/QTcB 414/430 ms   Echo 01/07/21  1. Left ventricular ejection fraction, by estimation, is 55 to 60%. The  left ventricle has normal function. The left ventricle has no regional  wall motion abnormalities. There is mild concentric left ventricular  hypertrophy of the septal segment. Left ventricular diastolic parameters are consistent with Grade I diastolic dysfunction (impaired relaxation). The average left ventricular global longitudinal strain is 19.8 %. The global longitudinal strain is normal.   2. Right ventricular systolic function is normal. The right ventricular  size is normal. There is normal pulmonary artery systolic pressure.   3. Left  atrial size was mildly dilated.   4. Right atrial size was mildly dilated.   5. The mitral valve is normal in structure. No evidence of mitral valve  regurgitation. No evidence of mitral stenosis.   6. The aortic valve is tricuspid. Aortic valve regurgitation is trivial.  No aortic stenosis is present.   7. Aortic dilatation noted. There is moderate dilatation of the aortic  root, measuring 46 mm. There is moderate dilatation of the ascending  aorta, measuring 44 mm.   8. The inferior vena cava is normal in size with greater than 50%  respiratory variability, suggesting right atrial pressure of 3 mmHg.    Epic records are reviewed at length today  CHA2DS2-VASc Score = 4  The patient's score is based upon: CHF History: 1 HTN History: 1 Diabetes History: 0 Stroke History: 0 Vascular Disease History: 1 Age Score: 1 Gender Score: 0       ASSESSMENT AND PLAN: Paroxysmal Atrial Fibrillation/atrial flutter (ICD10:  I48.0) The patient's CHA2DS2-VASc score is 4, indicating a 4.8% annual risk of stroke.   S/p afib and flutter ablation 06/16/24 Patient appears to be  maintaining SR Will stop flecainide  today. Continue Eliquis  5 mg BID Continue Toprol  37.5 mg daily Kardia mobile for home monitoring.   Secondary Hypercoagulable State (ICD10:  D68.69) The patient is at significant risk for stroke/thromboembolism based upon his CHA2DS2-VASc Score of 4.  Continue Apixaban  (Eliquis ). No bleeding issues.   HFrecEF EF 55-60% Fluid status appears stable today  HTN Stable on current regimen   Follow up with Dr Inocencio in 6 months.     Daril Kicks PA-C [facility] [address] [place], [place] [address] [phone] 09/13/2024 11:12 AM

## 2024-09-19 ENCOUNTER — Encounter: Payer: PPO | Admitting: Internal Medicine

## 2024-09-22 ENCOUNTER — Ambulatory Visit: Payer: Self-pay | Admitting: Internal Medicine

## 2024-09-22 ENCOUNTER — Ambulatory Visit: Admitting: Internal Medicine

## 2024-09-22 ENCOUNTER — Encounter: Payer: Self-pay | Admitting: Internal Medicine

## 2024-09-22 VITALS — BP 122/78 | HR 49 | Temp 98.2°F | Ht 72.0 in | Wt 241.0 lb

## 2024-09-22 DIAGNOSIS — Z125 Encounter for screening for malignant neoplasm of prostate: Secondary | ICD-10-CM | POA: Diagnosis not present

## 2024-09-22 DIAGNOSIS — E559 Vitamin D deficiency, unspecified: Secondary | ICD-10-CM

## 2024-09-22 DIAGNOSIS — Z23 Encounter for immunization: Secondary | ICD-10-CM | POA: Diagnosis not present

## 2024-09-22 DIAGNOSIS — R7302 Impaired glucose tolerance (oral): Secondary | ICD-10-CM | POA: Diagnosis not present

## 2024-09-22 DIAGNOSIS — I10 Essential (primary) hypertension: Secondary | ICD-10-CM

## 2024-09-22 DIAGNOSIS — E538 Deficiency of other specified B group vitamins: Secondary | ICD-10-CM

## 2024-09-22 DIAGNOSIS — E7849 Other hyperlipidemia: Secondary | ICD-10-CM | POA: Diagnosis not present

## 2024-09-22 DIAGNOSIS — Z0001 Encounter for general adult medical examination with abnormal findings: Secondary | ICD-10-CM

## 2024-09-22 DIAGNOSIS — I5042 Chronic combined systolic (congestive) and diastolic (congestive) heart failure: Secondary | ICD-10-CM | POA: Diagnosis not present

## 2024-09-22 DIAGNOSIS — Z0000 Encounter for general adult medical examination without abnormal findings: Secondary | ICD-10-CM

## 2024-09-22 DIAGNOSIS — N1831 Chronic kidney disease, stage 3a: Secondary | ICD-10-CM | POA: Diagnosis not present

## 2024-09-22 DIAGNOSIS — Z860101 Personal history of adenomatous and serrated colon polyps: Secondary | ICD-10-CM

## 2024-09-22 LAB — HEMOGLOBIN A1C: Hgb A1c MFr Bld: 6.1 % (ref 4.6–6.5)

## 2024-09-22 LAB — LIPID PANEL
Cholesterol: 123 mg/dL (ref 0–200)
HDL: 49.6 mg/dL (ref >39.00)
LDL Cholesterol: 54 mg/dL (ref 0–99)
NonHDL: 73.46
Total CHOL/HDL Ratio: 2
Triglycerides: 99 mg/dL (ref 0.0–149.0)
VLDL: 19.8 mg/dL (ref 0.0–40.0)

## 2024-09-22 LAB — CBC WITH DIFFERENTIAL/PLATELET
Basophils Absolute: 0 K/uL (ref 0.0–0.1)
Basophils Relative: 0.6 % (ref 0.0–3.0)
Eosinophils Absolute: 0.2 K/uL (ref 0.0–0.7)
Eosinophils Relative: 3.8 % (ref 0.0–5.0)
HCT: 41.4 % (ref 39.0–52.0)
Hemoglobin: 13.7 g/dL (ref 13.0–17.0)
Lymphocytes Relative: 38.2 % (ref 12.0–46.0)
Lymphs Abs: 1.9 K/uL (ref 0.7–4.0)
MCHC: 33.1 g/dL (ref 30.0–36.0)
MCV: 92.4 fl (ref 78.0–100.0)
Monocytes Absolute: 0.7 K/uL (ref 0.1–1.0)
Monocytes Relative: 13.4 % — ABNORMAL HIGH (ref 3.0–12.0)
Neutro Abs: 2.2 K/uL (ref 1.4–7.7)
Neutrophils Relative %: 44 % (ref 43.0–77.0)
Platelets: 186 K/uL (ref 150.0–400.0)
RBC: 4.48 Mil/uL (ref 4.22–5.81)
RDW: 13 % (ref 11.5–15.5)
WBC: 5 K/uL (ref 4.0–10.5)

## 2024-09-22 LAB — BASIC METABOLIC PANEL WITH GFR
BUN: 16 mg/dL (ref 6–23)
CO2: 29 meq/L (ref 19–32)
Calcium: 9.2 mg/dL (ref 8.4–10.5)
Chloride: 105 meq/L (ref 96–112)
Creatinine, Ser: 1.16 mg/dL (ref 0.40–1.50)
GFR: 63.29 mL/min (ref >60.00)
Glucose, Bld: 86 mg/dL (ref 70–99)
Potassium: 4.3 meq/L (ref 3.5–5.1)
Sodium: 141 meq/L (ref 135–145)

## 2024-09-22 LAB — URINALYSIS, ROUTINE W REFLEX MICROSCOPIC
Bilirubin Urine: NEGATIVE
Hgb urine dipstick: NEGATIVE
Ketones, ur: NEGATIVE
Leukocytes,Ua: NEGATIVE
Nitrite: NEGATIVE
RBC / HPF: NONE SEEN (ref 0–?)
Specific Gravity, Urine: <=1.005 — AB (ref 1.000–1.030)
Total Protein, Urine: NEGATIVE
Urine Glucose: NEGATIVE
Urobilinogen, UA: 0.2 (ref 0.0–1.0)
WBC, UA: NONE SEEN (ref 0–?)
pH: 6 (ref 5.0–8.0)

## 2024-09-22 LAB — HEPATIC FUNCTION PANEL
ALT: 21 U/L (ref 0–53)
AST: 21 U/L (ref 0–37)
Albumin: 4.3 g/dL (ref 3.5–5.2)
Alkaline Phosphatase: 100 U/L (ref 39–117)
Bilirubin, Direct: 0.2 mg/dL (ref 0.0–0.3)
Total Bilirubin: 0.7 mg/dL (ref 0.2–1.2)
Total Protein: 6.5 g/dL (ref 6.0–8.3)

## 2024-09-22 LAB — VITAMIN D 25 HYDROXY (VIT D DEFICIENCY, FRACTURES): VITD: 39.7 ng/mL (ref 30.00–100.00)

## 2024-09-22 LAB — PSA: PSA: 1.06 ng/mL (ref 0.10–4.00)

## 2024-09-22 LAB — VITAMIN B12: Vitamin B-12: 254 pg/mL (ref 211–911)

## 2024-09-22 LAB — TSH: TSH: 0.75 u[IU]/mL (ref 0.35–5.50)

## 2024-09-22 NOTE — Assessment & Plan Note (Addendum)
 Lab Results  Component Value Date   LDLCALC 77 09/18/2023   Uncontrolled mild, pt to continue current statin crestor  20 mg every day - for lower chol diet, f/u lab today

## 2024-09-22 NOTE — Patient Instructions (Signed)
 You had the flu shot today  Please have your Shingrix (shingles) shots done at your local pharmacy.  Please continue all other medications as before, and refills have been done if requested.  Please have the pharmacy call with any other refills you may need.  Please continue your efforts at being more active, low cholesterol diet, and weight control.  You are otherwise up to date with prevention measures today.  Please keep your appointments with your specialists as you may have planned  You will be contacted regarding the referral for: colonoscopy as you are due next month  Please go to the LAB at the blood drawing area for the tests to be done  You will be contacted by phone if any changes need to be made immediately.  Otherwise, you will receive a letter about your results with an explanation, but please check with MyChart first.  Please make an Appointment to return for your 1 year visit, or sooner if needed

## 2024-09-22 NOTE — Assessment & Plan Note (Signed)
Lab Results  Component Value Date   HGBA1C 6.0 09/18/2023   Stable, pt to continue current medical treatment  - diet,wt control

## 2024-09-22 NOTE — Assessment & Plan Note (Signed)
 BP Readings from Last 3 Encounters:  09/22/24 122/78  09/13/24 100/62  08/04/24 120/80   Stable, pt to continue medical treatment toprol  xl 25  - 1.5 tab every day

## 2024-09-22 NOTE — Progress Notes (Signed)
 The test results show that your current treatment is OK, as the tests are stable.  Please continue the same plan.  There is no other need for change of treatment or further evaluation based on these results, at this time.  thanks

## 2024-09-22 NOTE — Assessment & Plan Note (Signed)
 Age and sex appropriate education and counseling updated with regular exercise and diet Referrals for preventative services - for colonoscopy as is due next month Immunizations addressed - for flu shot today, for shingrix at pharmacy Smoking counseling  - none needed Evidence for depression or other mood disorder - none significant Most recent labs reviewed. I have personally reviewed and have noted: 1) the patient's medical and social history 2) The patient's current medications and supplements 3) The patient's height, weight, and BMI have been recorded in the chart

## 2024-09-22 NOTE — Assessment & Plan Note (Signed)
 Ckd3a  Lab Results  Component Value Date   CREATININE 1.35 (H) 06/06/2024   Stable overall, cont to avoid nephrotoxins, for f/u lab today

## 2024-09-22 NOTE — Assessment & Plan Note (Signed)
 S/p recent ablation, volume stable, cont current med tx, f/u cardiology as planned

## 2024-09-22 NOTE — Assessment & Plan Note (Signed)
 For colonoscopy referral as should be due next month, pt states not yet contacted

## 2024-09-22 NOTE — Progress Notes (Signed)
 Patient ID: [name], male   DOB: [birth year], 73 y.o.   MRN: [id number]         Chief Complaint:: wellness exam and hx of adenomatous colon polyps nov 2024, chf, hyperglycemia, htn, hld, ckd3a       HPI:  [name] is a 73 y.o. male here for wellness exam; due for f/u colonoscopy nov 2025 after several adenoma found nov 2024 per Dr San. ; due for flu shot today, and shingrx at the pharmacy, o/w up to date                        Also s/p cataract bilateral and vision improved.  Pt denies chest pain, increased sob or doe, wheezing, orthopnea, PND, increased LE swelling, palpitations, dizziness or syncope.   Pt denies polydipsia, polyuria, or new focal neuro s/s.    Pt denies fever, wt loss, night sweats, loss of appetite, or other constitutional symptoms  Has small umbilical hernia persistent but no pain or other worsening.     Wt Readings from Last 3 Encounters:  09/22/24 241 lb (109.3 kg)  09/13/24 242 lb 9.6 oz (110 kg)  08/04/24 240 lb 11.2 oz (109.2 kg)   BP Readings from Last 3 Encounters:  09/22/24 122/78  09/13/24 100/62  08/04/24 120/80   Immunization History  Administered Date(s) Administered   Fluad Quad(high Dose 65+) 12/16/2022   Fluad Trivalent(High Dose 65+) 09/18/2023   INFLUENZA, HIGH DOSE SEASONAL PF 08/10/2018, 09/22/2024   Influenza,inj,Quad PF,6+ Mos 01/17/2015   Influenza-Unspecified 12/01/2017   PFIZER(Purple Top)SARS-COV-2 Vaccination 03/01/2020, 03/26/2020   Pneumococcal Conjugate-13 06/15/2020   Pneumococcal Polysaccharide-23 07/28/2018   Tdap 01/17/2015   Health Maintenance Due  Topic Date Due   Zoster Vaccines- Shingrix (1 of 2) Never done   Colonoscopy  10/13/2024   Medicare Annual Wellness (AWV)  11/01/2024      Past Medical History:  Diagnosis Date   ALLERGIC RHINITIS 12/31/2007   Aortic atherosclerosis 08/26/2022   Arthritis    Blood transfusion without reported diagnosis    Chronic combined systolic and diastolic heart  failure (HCC) 92/84/7977   COUGH, CHRONIC 07/18/2007   DEGENERATIVE JOINT DISEASE 07/18/2007   Extrinsic asthma, unspecified 09/08/2009   GERD 07/18/2007   HYPERLIPIDEMIA 08/14/2010   Hypertension    Impaired glucose tolerance 09/23/2011   OBESITY 07/18/2007   Pneumonia    Past Surgical History:  Procedure Laterality Date   A-FLUTTER ABLATION N/A 06/16/2024   Procedure: A-FLUTTER ABLATION;  Surgeon: Inocencio Soyla Lunger, MD;  Location: [facility] INVASIVE CV LAB;  Service: Cardiovascular;  Laterality: N/A;   ATRIAL FIBRILLATION ABLATION N/A 06/16/2024   Procedure: ATRIAL FIBRILLATION ABLATION;  Surgeon: Inocencio Soyla Lunger, MD;  Location: [facility] INVASIVE CV LAB;  Service: Cardiovascular;  Laterality: N/A;   CARDIOVERSION N/A 12/28/2020   Procedure: CARDIOVERSION;  Surgeon: Raford Riggs, MD;  Location: [facility] ENDOSCOPY;  Service: Cardiovascular;  Laterality: N/A;   CARDIOVERSION N/A 09/04/2022   Procedure: CARDIOVERSION;  Surgeon: Jeffrie Oneil BROCKS, MD;  Location: [facility] ENDOSCOPY;  Service: Cardiovascular;  Laterality: N/A;   CARDIOVERSION N/A 02/05/2023   Procedure: CARDIOVERSION;  Surgeon: Lonni Slain, MD;  Location: [facility] ENDOSCOPY;  Service: Cardiovascular;  Laterality: N/A;   CARDIOVERSION N/A 02/15/2024   Procedure: CARDIOVERSION;  Surgeon: Alvan Ronal BRAVO, MD;  Location: [facility] INVASIVE CV LAB;  Service: Cardiovascular;  Laterality: N/A;   CARDIOVERSION N/A 03/11/2024   Procedure: CARDIOVERSION;  Surgeon: Francyne Headland, MD;  Location: [facility] INVASIVE CV LAB;  Service: Cardiovascular;  Laterality: N/A;   COLONOSCOPY     DG FEMUR LEFT  ([facility] HX)     rod placed   LUMBAR DISC SURGERY     TEE WITHOUT CARDIOVERSION N/A 12/28/2020   Procedure: TRANSESOPHAGEAL ECHOCARDIOGRAM (TEE);  Surgeon: Raford Riggs, MD;  Location: [facility] ENDOSCOPY;  Service: Cardiovascular;  Laterality: N/A;   TEE WITHOUT CARDIOVERSION N/A 09/04/2022   Procedure: TRANSESOPHAGEAL ECHOCARDIOGRAM (TEE);  Surgeon: Jeffrie Oneil BROCKS, MD;   Location: [facility] ENDOSCOPY;  Service: Cardiovascular;  Laterality: N/A;   TOOTH EXTRACTION     TOTAL KNEE ARTHROPLASTY Right 07/26/2018   Procedure: RIGHT TOTAL KNEE ARTHROPLASTY;  Surgeon: Lucilla Lynwood BRAVO, MD;  Location: [facility] OR;  Service: Orthopedics;  Laterality: Right;   TRANSESOPHAGEAL ECHOCARDIOGRAM (CATH LAB) N/A 02/15/2024   Procedure: TRANSESOPHAGEAL ECHOCARDIOGRAM;  Surgeon: Alvan Ronal BRAVO, MD;  Location: [facility] INVASIVE CV LAB;  Service: Cardiovascular;  Laterality: N/A;    reports that he has never smoked. He has never used smokeless tobacco. He reports current alcohol use of about 2.0 - 4.0 standard drinks of alcohol per week. He reports that he does not currently use drugs. family history includes Cerebral palsy in his sister; Diabetes in his maternal grandmother; Lupus in his father. No Known Allergies Current Outpatient Medications on File Prior to Visit  Medication Sig Dispense Refill   acetaminophen  (TYLENOL ) 650 MG CR tablet Take 650 mg by mouth in the morning.     albuterol  (PROAIR  HFA) 108 (90 Base) MCG/ACT inhaler Inhale 2 puffs into the lungs every 6 (six) hours as needed for wheezing or shortness of breath. 24 g 3   BESIVANCE 0.6 % SUSP Apply 1 drop to eye 3 (three) times daily. (Patient taking differently: Apply 1 drop to eye 2 (two) times daily.)     budesonide -formoterol  (SYMBICORT ) 80-4.5 MCG/ACT inhaler Inhale 2 puffs into the lungs 2 (two) times daily. (Patient taking differently: Inhale 2 puffs into the lungs as needed.) 3 each 3   ELIQUIS  5 MG TABS tablet TAKE 1 TABLET BY MOUTH TWICE A DAY 180 tablet 1   esomeprazole  (NEXIUM ) 40 MG capsule Take 1 capsule (40 mg total) by mouth daily at 12 noon. Take 1 Daily 90 capsule 3   Krill Oil 500 MG CAPS Take 500 mg by mouth daily.     melatonin 5 MG TABS Take 5 mg by mouth at bedtime.     metoprolol  succinate (TOPROL -XL) 25 MG 24 hr tablet TAKE 1.5 TABLETS BY MOUTH DAILY. 135 tablet 3   Multiple Vitamins-Minerals (CENTRUM ADULTS PO)  Take 1 tablet by mouth 3 (three) times a week.     rosuvastatin  (CRESTOR ) 20 MG tablet TAKE 1 TABLET BY MOUTH EVERY DAY IN THE EVENING 90 tablet 2   VEVYE 0.1 % SOLN Place 1 drop into both eyes 2 (two) times daily.     No current facility-administered medications on file prior to visit.        ROS:  All others reviewed and negative.  Objective        PE:  BP 122/78 (BP Location: Right Arm, Patient Position: Sitting, Cuff Size: Normal)   Pulse (!) 49   Temp 98.2 F (36.8 C) (Oral)   Ht 6' (1.829 m)   Wt 241 lb (109.3 kg)   SpO2 94%   BMI 32.69 kg/m                 Constitutional: Pt appears in NAD  HENT: Head: NCAT.                Right Ear: External ear normal.                 Left Ear: External ear normal.                Eyes: . Pupils are equal, round, and reactive to light. Conjunctivae and EOM are normal               Nose: without d/c or deformity               Neck: Neck supple. Gross normal ROM               Cardiovascular: Normal rate and regular rhythm.                 Pulmonary/Chest: Effort normal and breath sounds without rales or wheezing.                Abd:  Soft, NT, ND, + BS, no organomegaly               Neurological: Pt is alert. At baseline orientation, motor grossly intact               Skin: Skin is warm. No rashes, no other new lesions, LE edema - none               Psychiatric: Pt behavior is normal without agitation   Micro: none  Cardiac tracings I have personally interpreted today:  none  Pertinent Radiological findings (summarize): none   Lab Results  Component Value Date   WBC 5.6 06/06/2024   HGB 14.7 06/06/2024   HCT 45.5 06/06/2024   PLT 209 06/06/2024   GLUCOSE 87 06/06/2024   CHOL 150 09/18/2023   TRIG 92.0 09/18/2023   HDL 55.00 09/18/2023   LDLDIRECT 132.9 09/23/2011   LDLCALC 77 09/18/2023   ALT 21 09/18/2023   AST 20 09/18/2023   NA 144 06/06/2024   K 4.8 06/06/2024   CL 106 06/06/2024   CREATININE 1.35 (H)  06/06/2024   BUN 15 06/06/2024   CO2 20 06/06/2024   TSH 1.35 09/18/2023   PSA 1.13 09/18/2023   INR 0.96 07/16/2018   HGBA1C 6.0 09/18/2023   Assessment/Plan:  [name] is a 73 y.o. White or Caucasian [1] male with  has a past medical history of ALLERGIC RHINITIS (12/31/2007), Aortic atherosclerosis (08/26/2022), Arthritis, Blood transfusion without reported diagnosis, Chronic combined systolic and diastolic heart failure (HCC) (92/84/7977), COUGH, CHRONIC (07/18/2007), DEGENERATIVE JOINT DISEASE (07/18/2007), Extrinsic asthma, unspecified (09/08/2009), GERD (07/18/2007), HYPERLIPIDEMIA (08/14/2010), Hypertension, Impaired glucose tolerance (09/23/2011), OBESITY (07/18/2007), and Pneumonia.  Encounter for well adult exam with abnormal findings Age and sex appropriate education and counseling updated with regular exercise and diet Referrals for preventative services - for colonoscopy as is due next month Immunizations addressed - for flu shot today, for shingrix at pharmacy Smoking counseling  - none needed Evidence for depression or other mood disorder - none significant Most recent labs reviewed. I have personally reviewed and have noted: 1) the patient's medical and social history 2) The patient's current medications and supplements 3) The patient's height, weight, and BMI have been recorded in the chart   Chronic combined systolic and diastolic heart failure (HCC) S/p recent ablation, volume stable, cont current med tx, f/u cardiology as planned  Impaired glucose tolerance Lab Results  Component Value Date   HGBA1C  6.0 09/18/2023   Stable, pt to continue current medical treatment  - diet, wt control   Hyperlipidemia Lab Results  Component Value Date   LDLCALC 77 09/18/2023   Uncontrolled mild, pt to continue current statin crestor  20 mg every day - for lower chol diet, f/u lab today   HTN (hypertension) BP Readings from Last 3 Encounters:  09/22/24 122/78   09/13/24 100/62  08/04/24 120/80   Stable, pt to continue medical treatment toprol  xl 25  - 1.5 tab every day    History of adenomatous polyp of colon For colonoscopy referral as should be due next month, pt states not yet contacted  CKD (chronic kidney disease) stage 3, GFR 30-59 ml/min (HCC) Ckd3a  Lab Results  Component Value Date   CREATININE 1.35 (H) 06/06/2024   Stable overall, cont to avoid nephrotoxins, for f/u lab today  Followup: Return in about 1 year (around 09/22/2025).  Lynwood Rush, MD 09/22/2024 1:47 PM [facility]

## 2024-09-29 ENCOUNTER — Ambulatory Visit: Admitting: Orthopedic Surgery

## 2024-09-29 DIAGNOSIS — M79673 Pain in unspecified foot: Secondary | ICD-10-CM | POA: Diagnosis not present

## 2024-09-29 DIAGNOSIS — M65341 Trigger finger, right ring finger: Secondary | ICD-10-CM | POA: Diagnosis not present

## 2024-09-29 MED ORDER — BETAMETHASONE SOD PHOS & ACET 6 (3-3) MG/ML IJ SUSP
6.0000 mg | INTRAMUSCULAR | Status: AC | PRN
  Administered 2024-09-29: 6 mg via INTRA_ARTICULAR

## 2024-09-29 MED ORDER — LIDOCAINE HCL 1 % IJ SOLN
1.0000 mL | INTRAMUSCULAR | Status: AC | PRN
  Administered 2024-09-29: 1 mL

## 2024-09-29 NOTE — Progress Notes (Signed)
 SAMMUEL [name] - 73 y.o. male MRN [id number]  Date of birth: 11-Sep-1951  Office Visit Note: Visit Date: 09/29/2024 PCP: Norleen Lynwood ORN, MD Referred by: Norleen Lynwood ORN, MD  Subjective: No chief complaint on file.  HPI: [name] is a pleasant 73 y.o. male who returns today for follow-up of left small finger trigger digit that underwent injection approximate 6 weeks prior.  States that the injection helped alleviate symptoms entirely, he is very pleased with his progress and outcome.  He is having some notable clicking and locking of the right ring finger that is more progressive in nature, has not undergone prior treatments for this.   Pertinent ROS were reviewed with the patient and found to be negative unless otherwise specified above in HPI.     Assessment & Plan: Visit Diagnoses:  1. Trigger finger, right ring finger   2. Pain of foot, unspecified laterality     Plan: I am pleased to see that he has responded so well to the left small finger injection.  At this juncture, he is appropriate candidate for cortisone injection to the right ring finger A1 pulley as well for symptom relief.  We once again discussed the underlying nature and etiology of stenosing tenosynovitis as well as treatment modalities ranging from conservative to surgical.  Understanding risks and benefits, he like to proceed with cortisone injection to the right ring finger A1 pulley which was tolerated well today.  He will return to me as needed in the future.  He did also mention some ongoing tendinitis in the left foot that had been previously seen by Dr. Lucilla in the past.  He is interested in potentially seeing Dr. Harden in the future to reestablish care for this problem and discuss potential treatment modalities.   Follow-up: No follow-ups on file.   Meds & Orders: No orders of the defined types were placed in this encounter.   Orders Placed This Encounter  Procedures   Hand/UE Inj   Ambulatory  referral to Orthopedic Surgery     Procedures: Hand/UE Inj: R ring A1 for trigger finger on 09/29/2024 9:30 AM Indications: pain Details: 25 G needle, volar approach Medications: 1 mL lidocaine  1 %; 6 mg betamethasone  acetate-betamethasone  sodium phosphate  6 (3-3) MG/ML Outcome: tolerated well, no immediate complications Consent was given by the patient. Patient was prepped and draped in the usual sterile fashion.          Clinical History: No specialty comments available.  He reports that he has never smoked. He has never used smokeless tobacco.  Recent Labs    09/22/24 1352  HGBA1C 6.1    Objective:   Vital Signs: There were no vitals taken for this visit.  Physical Exam  Gen: Well-appearing, in no acute distress; non-toxic CV: Regular Rate. Well-perfused. Warm.  Resp: Breathing unlabored on room air; no wheezing. Psych: Fluid speech in conversation; appropriate affect; normal thought process  Ortho Exam Right hand: - Notable tenderness over the ring finger A1 pulley, observable clicking of the digit with deep flexion - Hand is warm well-perfused  Imaging: No results found.  Past Medical/Family/Surgical/Social History: Medications & Allergies reviewed per EMR, new medications updated. Patient Active Problem List   Diagnosis Date Noted   History of adenomatous polyp of colon 09/22/2024   Encounter for well adult exam with abnormal findings 09/18/2023   CKD (chronic kidney disease) stage 3, GFR 30-59 ml/min (HCC) 09/18/2023   Aortic atherosclerosis 08/26/2022   Partial intestinal  obstruction (HCC) 04/07/2022   Chronic combined systolic and diastolic heart failure (HCC) 06/14/2021   Atrial flutter (HCC) 12/24/2020   Secondary hypercoagulable state 12/24/2020   Tachycardia 12/21/2020   DOE (dyspnea on exertion) 12/21/2020   Anemia due to blood loss, acute 07/29/2018    Class: Acute   DVT (deep venous thrombosis) (HCC) 07/28/2018   Unilateral primary  osteoarthritis, right knee 07/26/2018    Class: End Stage   S/P TKR (total knee replacement) using cement, right 07/26/2018   Flu-like symptoms 01/23/2018   Dyspnea 01/07/2018   HTN (hypertension) 10/30/2016   Cough 10/30/2016   Asthma with exacerbation 10/30/2016   Edema 11/22/2015   Erectile dysfunction 01/17/2015   Corneal abrasion, right, sequela 06/27/2014   Preventative health care 09/23/2011   Impaired glucose tolerance 09/23/2011   Hyperlipidemia 08/14/2010   Extrinsic asthma 09/08/2009   ALLERGIC RHINITIS 12/31/2007   OBESITY 07/18/2007   GERD 07/18/2007   DEGENERATIVE JOINT DISEASE 07/18/2007   Past Medical History:  Diagnosis Date   ALLERGIC RHINITIS 12/31/2007   Aortic atherosclerosis 08/26/2022   Arthritis    Blood transfusion without reported diagnosis    Chronic combined systolic and diastolic heart failure (HCC) 06/14/2021   COUGH, CHRONIC 07/18/2007   DEGENERATIVE JOINT DISEASE 07/18/2007   Extrinsic asthma, unspecified 09/08/2009   GERD 07/18/2007   HYPERLIPIDEMIA 08/14/2010   Hypertension    Impaired glucose tolerance 09/23/2011   OBESITY 07/18/2007   Pneumonia    Family History  Problem Relation Age of Onset   Lupus Father    Cerebral palsy Sister    Diabetes Maternal Grandmother    Colon cancer Neg Hx    Esophageal cancer Neg Hx    Rectal cancer Neg Hx    Stomach cancer Neg Hx    Past Surgical History:  Procedure Laterality Date   A-FLUTTER ABLATION N/A 06/16/2024   Procedure: A-FLUTTER ABLATION;  Surgeon: Inocencio Soyla Lunger, MD;  [facility];  Service: Cardiovascular;  Laterality: N/A;   ATRIAL FIBRILLATION ABLATION N/A 06/16/2024   Procedure: ATRIAL FIBRILLATION ABLATION;  Surgeon: Inocencio Soyla Lunger, MD;  Location: [facility];  Service: Cardiovascular;  Laterality: N/A;   CARDIOVERSION N/A 12/28/2020   Procedure: CARDIOVERSION;  Surgeon: Raford Riggs, MD;  Location: [facility] ENDOSCOPY;  Service: Cardiovascular;   Laterality: N/A;   CARDIOVERSION N/A 09/04/2022   Procedure: CARDIOVERSION;  Surgeon: Jeffrie Oneil BROCKS, MD;  Location: [facility] ENDOSCOPY;  Service: Cardiovascular;  Laterality: N/A;   CARDIOVERSION N/A 02/05/2023   Procedure: CARDIOVERSION;  Surgeon: Lonni Slain, MD;  Location: [facility] ENDOSCOPY;  Service: Cardiovascular;  Laterality: N/A;   CARDIOVERSION N/A 02/15/2024   Procedure: CARDIOVERSION;  Surgeon: Alvan Ronal BRAVO, MD;  [facility]: [facility];  Service: Cardiovascular;  Laterality: N/A;   CARDIOVERSION N/A 03/11/2024   Procedure: CARDIOVERSION;  Surgeon: Francyne Headland, MD;  [facility]: [facility];  Service: Cardiovascular;  Laterality: N/A;   COLONOSCOPY     DG FEMUR LEFT  ([facility] HX)     rod placed   LUMBAR DISC SURGERY     TEE WITHOUT CARDIOVERSION N/A 12/28/2020   Procedure: TRANSESOPHAGEAL ECHOCARDIOGRAM (TEE);  Surgeon: Raford Riggs, MD;  Location: [facility] ENDOSCOPY;  Service: Cardiovascular;  Laterality: N/A;   TEE WITHOUT CARDIOVERSION N/A 09/04/2022   Procedure: TRANSESOPHAGEAL ECHOCARDIOGRAM (TEE);  Surgeon: Jeffrie Oneil BROCKS, MD;  Location: [facility] ENDOSCOPY;  Service: Cardiovascular;  Laterality: N/A;   TOOTH EXTRACTION     TOTAL KNEE ARTHROPLASTY Right 07/26/2018   Procedure: RIGHT  TOTAL KNEE ARTHROPLASTY;  Surgeon: Lucilla Lynwood BRAVO, MD;  Location: [facility] OR;  Service: Orthopedics;  Laterality: Right;   TRANSESOPHAGEAL ECHOCARDIOGRAM (CATH LAB) N/A 02/15/2024   Procedure: TRANSESOPHAGEAL ECHOCARDIOGRAM;  Surgeon: Alvan Ronal BRAVO, MD;  Location: [facility] INVASIVE CV LAB;  Service: Cardiovascular;  Laterality: N/A;   Social History   Occupational History   Occupation: Administrator  Tobacco Use   Smoking status: Never   Smokeless tobacco: Never   Tobacco comments:    Marijuana 30 years  Vaping Use   Vaping status: Never Used  Substance and Sexual Activity   Alcohol use: Yes    Alcohol/week: 2.0 - 4.0 standard drinks of alcohol    Types: 2 - 4 Cans of beer per week     Comment: moderate   Drug use: Not Currently    Comment: as a teenager   Sexual activity: Not on file    Valene Villa Estela) Arlinda, M.D. [facility], Hand Surgery

## 2024-10-03 ENCOUNTER — Encounter: Payer: Self-pay | Admitting: Radiology

## 2024-10-06 ENCOUNTER — Other Ambulatory Visit: Payer: Self-pay

## 2024-10-06 ENCOUNTER — Ambulatory Visit: Admitting: Physician Assistant

## 2024-10-06 ENCOUNTER — Encounter: Payer: Self-pay | Admitting: Physician Assistant

## 2024-10-06 ENCOUNTER — Telehealth: Payer: Self-pay

## 2024-10-06 DIAGNOSIS — M7741 Metatarsalgia, right foot: Secondary | ICD-10-CM

## 2024-10-06 DIAGNOSIS — M25572 Pain in left ankle and joints of left foot: Secondary | ICD-10-CM

## 2024-10-06 NOTE — Progress Notes (Signed)
 Office Visit Note   Patient: [name]           Date of Birth: December 25, 1950           MRN: [id number] Visit Date: 10/06/2024              Requested by: Arlinda Buster, MD [address] [place],  [place] [address] PCP: Norleen Lynwood ORN, MD  Chief Complaint  Patient presents with   Left Foot - Pain      HPI: He has left foot pain with weight bearing that as he walks it goes away.  Sometimes it gets painful with just sitting.  He states it feels like his dorsal tendons are painful.  He denies injury, he has used a metatarsal pad in his shoes in the past that seems to have helped.  He has not used these much anymore.    He also has lateral foot pain on the plantar aspect that hurt when he walks barefoot.  No erythema or swelling.    Assessment & Plan: Visit Diagnoses:  1. Pain in left ankle and joints of left foot   2. Metatarsalgia, right foot     Plan: Orthotics: Use over-the-counter or custom-fitted arch supports and metatarsal pads to help with shock absorption and reduce pressure on the metatarsal bones. Some pads can provide immediate relief if placed correctly just ahead of the painful area.  Recommended Sole cork orthotics and he has a NB walking shoe with a stiffer sole.  He does go to the gym and does strengthening and stretching.  We specifically discussed Gastrocnemius stretches.   Follow-Up Instructions: Return if symptoms worsen or fail to improve.   Ortho Exam  Patient is alert, oriented, no adenopathy, well-dressed, normal affect, normal respiratory effort. Tight achillis to neutral.  Non tender to palpation dorsal and plantar foot.  Palpable DP pulse.  No edema good skin lines.  Non antalgic gait with ambulation.      Imaging: Metatarsal arthritis with dorsal bone spurs.   alignment of the bones is normal without rocker bottom deformity.     Labs: Lab Results  Component Value Date   HGBA1C 6.1 09/22/2024   HGBA1C 6.0 09/18/2023   HGBA1C 6.0 02/20/2023    REPTSTATUS 05/12/2012 FINAL 05/12/2012   REPTSTATUS 05/14/2012 FINAL 05/12/2012   GRAMSTAIN  05/12/2012    FEW WBC PRESENT,BOTH PMN AND MONONUCLEAR NO ORGANISMS SEEN   GRAMSTAIN  05/12/2012    FEW WBC PRESENT,BOTH PMN AND MONONUCLEAR NO ORGANISMS SEEN Performed at [facility]   CULT  05/12/2012    FEW STAPHYLOCOCCUS AUREUS Note: RIFAMPIN AND GENTAMICIN SHOULD NOT BE USED AS SINGLE DRUGS FOR TREATMENT OF STAPH INFECTIONS. CRITICAL RESULT CALLED TO, READ BACK BY AND VERIFIED WITH: TONI IN ER @0705  05/14/12 BY KRAWS This organism is presumed to be Clindamycin resistant based  on detection of inducible Clindamycin resistance.   LABORGA STAPHYLOCOCCUS AUREUS 05/12/2012     Lab Results  Component Value Date   ALBUMIN 4.3 09/22/2024   ALBUMIN 4.3 09/18/2023   ALBUMIN 4.3 08/20/2022    Lab Results  Component Value Date   MG 2.2 12/23/2022   Lab Results  Component Value Date   VD25OH 39.70 09/22/2024   VD25OH 42.72 09/18/2023   VD25OH 41.26 04/03/2022    No results found for: PREALBUMIN    Latest Ref Rng & Units 09/22/2024    1:52 PM 06/06/2024   10:32 AM 02/11/2024    3:25 PM  CBC EXTENDED  WBC 4.0 - 10.5 K/uL 5.0  5.6  5.9   RBC 4.22 - 5.81 Mil/uL 4.48  4.83  4.75   Hemoglobin 13.0 - 17.0 g/dL 86.2  85.2  85.6   HCT 39.0 - 52.0 % 41.4  45.5  44.7   Platelets 150.0 - 400.0 K/uL 186.0  209  203   NEUT# 1.4 - 7.7 K/uL 2.2     Lymph# 0.7 - 4.0 K/uL 1.9        There is no height or weight on file to calculate BMI.  Orders:  Orders Placed This Encounter  Procedures   XR Foot 2 Views Left   No orders of the defined types were placed in this encounter.    Procedures: No procedures performed  Clinical Data: No additional findings.  ROS:  All other systems negative, except as noted in the HPI. Review of Systems  Objective: Vital Signs: There were no vitals taken for this visit.  Specialty Comments:  No specialty comments available.  PMFS  History: Patient Active Problem List   Diagnosis Date Noted   History of adenomatous polyp of colon 09/22/2024   Encounter for well adult exam with abnormal findings 09/18/2023   CKD (chronic kidney disease) stage 3, GFR 30-59 ml/min (HCC) 09/18/2023   Aortic atherosclerosis 08/26/2022   Partial intestinal obstruction (HCC) 04/07/2022   Chronic combined systolic and diastolic heart failure (HCC) 06/14/2021   Atrial flutter (HCC) 12/24/2020   Secondary hypercoagulable state 12/24/2020   Tachycardia 12/21/2020   DOE (dyspnea on exertion) 12/21/2020   Anemia due to blood loss, acute 07/29/2018    Class: Acute   DVT (deep venous thrombosis) (HCC) 07/28/2018   Unilateral primary osteoarthritis, right knee 07/26/2018    Class: End Stage   S/P TKR (total knee replacement) using cement, right 07/26/2018   Flu-like symptoms 01/23/2018   Dyspnea 01/07/2018   HTN (hypertension) 10/30/2016   Cough 10/30/2016   Asthma with exacerbation 10/30/2016   Edema 11/22/2015   Erectile dysfunction 01/17/2015   Corneal abrasion, right, sequela 06/27/2014   Preventative health care 09/23/2011   Impaired glucose tolerance 09/23/2011   Hyperlipidemia 08/14/2010   Extrinsic asthma 09/08/2009   ALLERGIC RHINITIS 12/31/2007   OBESITY 07/18/2007   GERD 07/18/2007   DEGENERATIVE JOINT DISEASE 07/18/2007   Past Medical History:  Diagnosis Date   ALLERGIC RHINITIS 12/31/2007   Aortic atherosclerosis 08/26/2022   Arthritis    Blood transfusion without reported diagnosis    Chronic combined systolic and diastolic heart failure (HCC) 06/14/2021   COUGH, CHRONIC 07/18/2007   DEGENERATIVE JOINT DISEASE 07/18/2007   Extrinsic asthma, unspecified 09/08/2009   GERD 07/18/2007   HYPERLIPIDEMIA 08/14/2010   Hypertension    Impaired glucose tolerance 09/23/2011   OBESITY 07/18/2007   Pneumonia     Family History  Problem Relation Age of Onset   Lupus Father    Cerebral palsy Sister    Diabetes Maternal  Grandmother    Colon cancer Neg Hx    Esophageal cancer Neg Hx    Rectal cancer Neg Hx    Stomach cancer Neg Hx     Past Surgical History:  Procedure Laterality Date   A-FLUTTER ABLATION N/A 06/16/2024   Procedure: A-FLUTTER ABLATION;  Surgeon: Inocencio Soyla Lunger, MD;  Location: [facility];  Service: Cardiovascular;  Laterality: N/A;   ATRIAL FIBRILLATION ABLATION N/A 06/16/2024   Procedure: ATRIAL FIBRILLATION ABLATION;  Surgeon: Inocencio Soyla Lunger, MD;  Location: [facility] LAB;  Service: Cardiovascular;  Laterality: N/A;   CARDIOVERSION N/A 12/28/2020   Procedure: CARDIOVERSION;  Surgeon: Raford Riggs, MD;  Location: [facility] ENDOSCOPY;  Service: Cardiovascular;  Laterality: N/A;   CARDIOVERSION N/A 09/04/2022   Procedure: CARDIOVERSION;  Surgeon: Jeffrie Oneil BROCKS, MD;  Location: [facility] ENDOSCOPY;  Service: Cardiovascular;  Laterality: N/A;   CARDIOVERSION N/A 02/05/2023   Procedure: CARDIOVERSION;  Surgeon: Lonni Slain, MD;  Location: [facility] ENDOSCOPY;  Service: Cardiovascular;  Laterality: N/A;   CARDIOVERSION N/A 02/15/2024   Procedure: CARDIOVERSION;  Surgeon: Alvan Ronal BRAVO, MD;  Location: [facility]VASIVE [facility] LAB;  Service: Cardiovascular;  Laterality: N/A;   CARDIOVERSION N/A 03/11/2024   Procedure: CARDIOVERSION;  Surgeon: Francyne Headland, MD;  Location: [facility] INVASIVE CV LAB;  Service: Cardiovascular;  Laterality: N/A;   COLONOSCOPY     DG FEMUR LEFT  ([facility] HX)     rod placed   LUMBAR DISC SURGERY     TEE WITHOUT CARDIOVERSION N/A 12/28/2020   Procedure: TRANSESOPHAGEAL ECHOCARDIOGRAM (TEE);  Surgeon: Raford Riggs, MD;  Location: [facility] ENDOSCOPY;  Service: Cardiovascular;  Laterality: N/A;   TEE WITHOUT CARDIOVERSION N/A 09/04/2022   Procedure: TRANSESOPHAGEAL ECHOCARDIOGRAM (TEE);  Surgeon: Jeffrie Oneil BROCKS, MD;  Location: [facility] ENDOSCOPY;  Service: Cardiovascular;  Laterality: N/A;   TOOTH EXTRACTION     TOTAL KNEE ARTHROPLASTY Right 07/26/2018   Procedure: RIGHT TOTAL  KNEE ARTHROPLASTY;  Surgeon: Lucilla Lynwood BRAVO, MD;  Location: [facility] OR;  Service: Orthopedics;  Laterality: Right;   TRANSESOPHAGEAL ECHOCARDIOGRAM (CATH LAB) N/A 02/15/2024   Procedure: TRANSESOPHAGEAL ECHOCARDIOGRAM;  Surgeon: Alvan Ronal BRAVO, MD;  Location: [facility] INVASIVE CV LAB;  Service: Cardiovascular;  Laterality: N/A;   Social History   Occupational History   Occupation: Administrator  Tobacco Use   Smoking status: Never   Smokeless tobacco: Never   Tobacco comments:    Marijuana 30 years  Vaping Use   Vaping status: Never Used  Substance and Sexual Activity   Alcohol use: Yes    Alcohol/week: 2.0 - 4.0 standard drinks of alcohol    Types: 2 - 4 Cans of beer per week    Comment: moderate   Drug use: Not Currently    Comment: as a teenager   Sexual activity: Not on file

## 2024-10-06 NOTE — Telephone Encounter (Signed)
 Copied from CRM #[id number]. Topic: Appointments - Transfer of Care >> Oct 06, 2024  2:01 PM Berneda FALCON wrote: Pt is requesting to transfer FROM: Dr. Norleen Pt is requesting to transfer TO: [place] Reason for requested transfer: PCP retiring It is the responsibility of the team the patient would like to transfer to (Dr. Bennett) to reach out to the patient if for any reason this transfer is not acceptable.

## 2024-10-07 NOTE — Telephone Encounter (Signed)
Okay for transfer 

## 2024-11-09 ENCOUNTER — Telehealth: Payer: Self-pay | Admitting: Physical Medicine and Rehabilitation

## 2024-11-09 NOTE — Telephone Encounter (Signed)
 Pt called and wants to schedule for shoulder injection. CB#[phone]

## 2024-11-16 ENCOUNTER — Other Ambulatory Visit: Payer: Self-pay | Admitting: Physical Medicine and Rehabilitation

## 2024-11-16 ENCOUNTER — Telehealth: Payer: Self-pay

## 2024-11-16 DIAGNOSIS — G8929 Other chronic pain: Secondary | ICD-10-CM

## 2024-11-16 NOTE — Telephone Encounter (Signed)
 Last injection 5/24 % 90 relief/ improvement Duration of relief- 8 months Current pain score--8 Recent falls or injuries--None Same pain  and same location for bilateral shoulders

## 2024-11-21 ENCOUNTER — Ambulatory Visit: Admitting: Physical Medicine and Rehabilitation

## 2024-11-21 ENCOUNTER — Other Ambulatory Visit: Payer: Self-pay

## 2024-11-21 DIAGNOSIS — G8929 Other chronic pain: Secondary | ICD-10-CM | POA: Diagnosis not present

## 2024-11-21 DIAGNOSIS — M25511 Pain in right shoulder: Secondary | ICD-10-CM | POA: Diagnosis not present

## 2024-11-21 DIAGNOSIS — M25512 Pain in left shoulder: Secondary | ICD-10-CM | POA: Diagnosis not present

## 2024-11-21 MED ORDER — TRIAMCINOLONE ACETONIDE 40 MG/ML IJ SUSP
40.0000 mg | INTRAMUSCULAR | Status: AC | PRN
  Administered 2024-11-21: 40 mg via INTRA_ARTICULAR

## 2024-11-21 MED ORDER — BUPIVACAINE HCL 0.5 % IJ SOLN
5.0000 mL | INTRAMUSCULAR | Status: AC | PRN
  Administered 2024-11-21: 5 mL via INTRA_ARTICULAR

## 2024-11-21 NOTE — Progress Notes (Signed)
 Pain Scale   Average Pain 6 Patient advising he has chronic bilateral shoulder pain pain is constant at night.        +Driver, -BT, -Dye Allergies.

## 2024-11-21 NOTE — Progress Notes (Signed)
" ° °  [name] - 73 y.o. male MRN [id number]  Date of birth: 1951/08/16  Office Visit Note: Visit Date: 11/21/2024 PCP: Norleen Lynwood ORN, MD Referred by: Norleen Lynwood ORN, MD  Subjective: Chief Complaint  Patient presents with   Right Shoulder - Pain   Left Shoulder - Pain   HPI:  [name] is a 73 y.o. male who comes in today at the request of Duwaine Pouch, FNP for planned Bilateral anesthetic glenohumeral arthrogram with fluoroscopic guidance.  The patient has failed conservative care including home exercise, medications, time and activity modification.  This injection will be diagnostic and hopefully therapeutic.  Please see requesting physician notes for further details and justification.   ROS Otherwise per HPI.  Assessment & Plan: Visit Diagnoses:    ICD-10-CM   1. Chronic right shoulder pain  M25.511 Large Joint Inj: bilateral glenohumeral   G89.29     2. Chronic left shoulder pain  M25.512 Large Joint Inj: bilateral glenohumeral   G89.29       Plan: No additional findings.   Meds & Orders: No orders of the defined types were placed in this encounter.   Orders Placed This Encounter  Procedures   Large Joint Inj: bilateral glenohumeral    Follow-up: Return if symptoms worsen or fail to improve.   Procedures: Large Joint Inj: bilateral glenohumeral on 11/21/2024 8:26 AM Indications: pain and diagnostic evaluation Details: 22 G 3.5 in needle, fluoroscopy-guided anteromedial approach  Arthrogram: No  Medications (Right): 40 mg triamcinolone  acetonide 40 MG/ML; 5 mL bupivacaine  0.5 % Medications (Left): 40 mg triamcinolone  acetonide 40 MG/ML; 5 mL bupivacaine  0.5 % Outcome: tolerated well, no immediate complications  There was excellent flow of contrast producing a partial arthrogram of the glenohumeral joint. The patient did have relief of symptoms during the anesthetic phase of the injection. Procedure, treatment alternatives, risks and benefits explained,  specific risks discussed. Consent was given by the patient. Immediately prior to procedure a time out was called to verify the correct patient, procedure, equipment, support staff and site/side marked as required. Patient was prepped and draped in the usual sterile fashion.          Clinical History: No specialty comments available.     Objective:  VS:  HT:    WT:   BMI:     BP:   HR: bpm  TEMP: ( )  RESP:  Physical Exam   Imaging: No results found. "

## 2024-11-24 ENCOUNTER — Other Ambulatory Visit (HOSPITAL_BASED_OUTPATIENT_CLINIC_OR_DEPARTMENT_OTHER): Payer: Self-pay | Admitting: Cardiovascular Disease

## 2024-11-25 ENCOUNTER — Ambulatory Visit: Admitting: Physical Medicine and Rehabilitation

## 2024-12-08 ENCOUNTER — Encounter

## 2024-12-30 ENCOUNTER — Encounter: Payer: Self-pay | Admitting: Gastroenterology

## 2024-12-30 ENCOUNTER — Telehealth: Payer: Self-pay

## 2024-12-30 ENCOUNTER — Ambulatory Visit: Admitting: Gastroenterology

## 2024-12-30 VITALS — BP 122/74 | HR 69 | Ht 72.0 in | Wt 250.1 lb

## 2024-12-30 DIAGNOSIS — Z860101 Personal history of adenomatous and serrated colon polyps: Secondary | ICD-10-CM

## 2024-12-30 DIAGNOSIS — K648 Other hemorrhoids: Secondary | ICD-10-CM

## 2024-12-30 DIAGNOSIS — Z860100 Personal history of colon polyps, unspecified: Secondary | ICD-10-CM

## 2024-12-30 DIAGNOSIS — K625 Hemorrhage of anus and rectum: Secondary | ICD-10-CM | POA: Diagnosis not present

## 2024-12-30 MED ORDER — NA SULFATE-K SULFATE-MG SULF 17.5-3.13-1.6 GM/177ML PO SOLN: 1.0000 | Freq: Once | ORAL | 0 refills | Status: AC

## 2024-12-30 NOTE — Patient Instructions (Signed)
 We have sent the following medications to your pharmacy for you to pick up at your convenience: SUPREP  You have been scheduled for a colonoscopy. Please follow written instructions given to you at your visit today.   If you use inhalers (even only as needed), please bring them with you on the day of your procedure.  DO NOT TAKE 7 DAYS PRIOR TO TEST- Trulicity (dulaglutide) Ozempic, Wegovy (semaglutide) Mounjaro, Zepbound (tirzepatide) Bydureon Bcise (exanatide extended release)  DO NOT TAKE 1 DAY PRIOR TO YOUR TEST Rybelsus (semaglutide) Adlyxin (lixisenatide) Victoza (liraglutide) Byetta (exanatide) ___________________________________________________________________________  Due to recent changes in healthcare laws, you may see the results of your imaging and laboratory studies on MyChart before your provider has had a chance to review them.  We understand that in some cases there may be results that are confusing or concerning to you. Not all laboratory results come back in the same time frame and the provider may be waiting for multiple results in order to interpret others.  Please give us  48 hours in order for your provider to thoroughly review all the results before contacting the office for clarification of your results.   _______________________________________________________  If your blood pressure at your visit was 140/90 or greater, please contact your primary care physician to follow up on this.  _______________________________________________________  If you are age 52 or older, your body mass index should be between 23-30. Your Body mass index is 33.92 kg/m. If this is out of the aforementioned range listed, please consider follow up with your Primary Care Provider.  If you are age 72 or younger, your body mass index should be between 19-25. Your Body mass index is 33.92 kg/m. If this is out of the aformentioned range listed, please consider follow up with your Primary  Care Provider.   ________________________________________________________  The [facility] providers would like to encourage you to use MYCHART to communicate with providers for non-urgent requests or questions.  Due to long hold times on the telephone, sending your provider a message by [facility] may be a faster and more efficient way to get a response.  Please allow 48 business hours for a response.  Please remember that this is for non-urgent requests.  _______________________________________________________  [facility] is using a team-based approach to care.  Your team is made up of your doctor and two to three APPS. Our APPS (Nurse Practitioners and Physician Assistants) work with your physician to ensure care continuity for you. They are fully qualified to address your health concerns and develop a treatment plan. They communicate directly with your gastroenterologist to care for you. Seeing the Advanced Practice Practitioners on your physician's team can help you by facilitating care more promptly, often allowing for earlier appointments, access to diagnostic testing, procedures, and other specialty referrals.   Thank you for trusting me with your gastrointestinal care. Deanna May, FNP-C

## 2024-12-30 NOTE — Telephone Encounter (Signed)
 [facility] Pre-operative Risk Assessment     Request for surgical clearance:     Endoscopy Procedure  What type of surgery is being performed?     Endoscopic  When is this surgery scheduled?     01/31/25  What type of clearance is required ?   Pharmacy  Are there any medications that need to be held prior to surgery and how long? Eliquis , 2 days  Practice name and name of physician performing surgery?      [facility]  What is your office phone and fax number?      Phone- [phone]  Fax- [phone]  Anesthesia type (None, local, MAC, general) ?       MAC   Please route your response to Karna Louder, RMA

## 2024-12-30 NOTE — Progress Notes (Signed)
 "  Chief Complaint:recall colonoscopy Primary GI Doctor:Dr. San  HPI: [name] is a 74 y.o. male with a history of HTN, HLD, IFG, atrial flutter s/p cardioversion 12/2020 then TEE/DCCV 08/2022 (on Eliquis ), CHF w/ pEF, GERD, presenting to the [facility]ology [facility] to discuss recall colonoscopy   [name] last seen in GI office on 10/12/23 by Dr. San for scheduling colonoscopy for CRC screening and evaluation of + Cologuard.   09/13/24 seen by cardiology for follow up, reviewed entire note.   Interval History [name] presents for evaluation of colonoscopy for history of colonic polyps.   [name] had intermittent BRB with wiping few weeks ago, he noted hemorrhoids. He put OTC Prep H on it couple days and as resolved. [name] denies constipation and diarrhea. [name] denies straining or pushing.   [name] on Eliquis  5 mg BID.   Medical Hx update: cataract surgeries    Endoscopic History: - Colonoscopy (01/2005): Normal - Cologuard negative 07/2020 - 09/2023: Cologuard positive --10/2023 colonoscopy: Recall 1 year - Eight 3 to 6 mm polyps in the ascending colon and in the cecum, removed with a cold snare. Resected and retrieved.  - Four 3 to 5 mm polyps in the sigmoid colon and in the descending colon, removed with a cold snare. Resected and retrieved.  - One 2 mm polyp in the rectum, removed with a cold snare. Resected and retrieved.  - One 12 mm polyp in the rectum, removed with a hot snare. Resected and retrieved.  - Diverticulosis in the sigmoid colon.  - Non- bleeding internal hemorrhoids.  - The examined portion of the ileum was normal. BX: TA's and SSP  Wt Readings from Last 3 Encounters:  12/30/24 250 lb 2 oz (113.5 kg)  09/22/24 241 lb (109.3 kg)  09/13/24 242 lb 9.6 oz (110 kg)    Past Medical History:  Diagnosis Date   ALLERGIC RHINITIS 12/31/2007   Aortic atherosclerosis 08/26/2022   Arthritis    Blood transfusion without reported diagnosis    Chronic  combined systolic and diastolic heart failure (HCC) 06/14/2021   COUGH, CHRONIC 07/18/2007   DEGENERATIVE JOINT DISEASE 07/18/2007   Extrinsic asthma, unspecified 09/08/2009   GERD 07/18/2007   HYPERLIPIDEMIA 08/14/2010   Hypertension    Impaired glucose tolerance 09/23/2011   OBESITY 07/18/2007   Pneumonia     Past Surgical History:  Procedure Laterality Date   A-FLUTTER ABLATION N/A 06/16/2024   Procedure: A-FLUTTER ABLATION;  Surgeon: Inocencio Soyla Lunger, MD;  Location: [facility] INVASIVE CV LAB;  Service: Cardiovascular;  Laterality: N/A;   ATRIAL FIBRILLATION ABLATION N/A 06/16/2024   Procedure: ATRIAL FIBRILLATION ABLATION;  Surgeon: Inocencio Soyla Lunger, MD;  Location: [facility] INVASIVE CV LAB;  Service: Cardiovascular;  Laterality: N/A;   CARDIOVERSION N/A 12/28/2020   Procedure: CARDIOVERSION;  Surgeon: Raford Riggs, MD;  Location: [facility] ENDOSCOPY;  Service: Cardiovascular;  Laterality: N/A;   CARDIOVERSION N/A 09/04/2022   Procedure: CARDIOVERSION;  Surgeon: Jeffrie Oneil BROCKS, MD;  Location: [facility] ENDOSCOPY;  Service: Cardiovascular;  Laterality: N/A;   CARDIOVERSION N/A 02/05/2023   Procedure: CARDIOVERSION;  Surgeon: Lonni Slain, MD;  Location: [facility] ENDOSCOPY;  Service: Cardiovascular;  Laterality: N/A;   CARDIOVERSION N/A 02/15/2024   Procedure: CARDIOVERSION;  Surgeon: Alvan Ronal BRAVO, MD;  Location: [facility] INVASIVE CV LAB;  Service: Cardiovascular;  Laterality: N/A;   CARDIOVERSION N/A 03/11/2024   Procedure: CARDIOVERSION;  Surgeon: Francyne Headland, MD;  Location: [facility] INVASIVE CV LAB;  Service: Cardiovascular;  Laterality: N/A;   COLONOSCOPY     DG FEMUR  LEFT  ([facility] HX)     rod placed   LUMBAR DISC SURGERY     TEE WITHOUT CARDIOVERSION N/A 12/28/2020   Procedure: TRANSESOPHAGEAL ECHOCARDIOGRAM (TEE);  Surgeon: Raford Riggs, MD;  Location: [facility] ENDOSCOPY;  Service: Cardiovascular;  Laterality: N/A;   TEE WITHOUT CARDIOVERSION N/A 09/04/2022   Procedure: TRANSESOPHAGEAL  ECHOCARDIOGRAM (TEE);  Surgeon: Jeffrie Oneil BROCKS, MD;  Location: [facility] ENDOSCOPY;  Service: Cardiovascular;  Laterality: N/A;   TOOTH EXTRACTION     TOTAL KNEE ARTHROPLASTY Right 07/26/2018   Procedure: RIGHT TOTAL KNEE ARTHROPLASTY;  Surgeon: Lucilla Lynwood BRAVO, MD;  Location: [facility] OR;  Service: Orthopedics;  Laterality: Right;   TRANSESOPHAGEAL ECHOCARDIOGRAM (CATH LAB) N/A 02/15/2024   Procedure: TRANSESOPHAGEAL ECHOCARDIOGRAM;  Surgeon: Alvan Ronal BRAVO, MD;  Location: [facility] INVASIVE CV LAB;  Service: Cardiovascular;  Laterality: N/A;    Current Outpatient Medications  Medication Sig Dispense Refill   acetaminophen  (TYLENOL ) 650 MG CR tablet Take 650 mg by mouth in the morning.     albuterol  (PROAIR  HFA) 108 (90 Base) MCG/ACT inhaler Inhale 2 puffs into the lungs every 6 (six) hours as needed for wheezing or shortness of breath. 24 g 3   budesonide -formoterol  (SYMBICORT ) 80-4.5 MCG/ACT inhaler Inhale 2 puffs into the lungs 2 (two) times daily. 3 each 3   ELIQUIS  5 MG TABS tablet TAKE 1 TABLET BY MOUTH TWICE A DAY 180 tablet 1   esomeprazole  (NEXIUM ) 40 MG capsule Take 1 capsule (40 mg total) by mouth daily at 12 noon. Take 1 Daily 90 capsule 3   Krill Oil 500 MG CAPS Take 500 mg by mouth daily.     melatonin 5 MG TABS Take 5 mg by mouth at bedtime.     metoprolol  succinate (TOPROL -XL) 25 MG 24 hr tablet TAKE 1.5 TABLETS BY MOUTH DAILY. 135 tablet 3   Multiple Vitamins-Minerals (CENTRUM ADULTS PO) Take 1 tablet by mouth 3 (three) times a week.     rosuvastatin  (CRESTOR ) 20 MG tablet TAKE 1 TABLET BY MOUTH EVERY DAY IN THE EVENING 90 tablet 2   No current facility-administered medications for this visit.    Allergies as of 12/30/2024   (No Known Allergies)    Family History  Problem Relation Age of Onset   Lupus Father    Cerebral palsy Sister    Diabetes Maternal Grandmother    Colon cancer Neg Hx    Esophageal cancer Neg Hx    Rectal cancer Neg Hx    Stomach cancer Neg Hx     Review of  Systems:    Constitutional: No weight loss, fever, chills, weakness or fatigue HEENT: Eyes: No change in vision               Ears, Nose, Throat:  No change in hearing or congestion Skin: No rash or itching Cardiovascular: No chest pain, chest pressure or palpitations   Respiratory: No SOB or cough Gastrointestinal: See HPI and otherwise negative Genitourinary: No dysuria or change in urinary frequency Neurological: No headache, dizziness or syncope Musculoskeletal: No new muscle or joint pain Hematologic: No bleeding or bruising Psychiatric: No history of depression or anxiety    Physical Exam:  Vital signs: BP 122/74   Pulse 69   Ht 6' (1.829 m)   Wt 250 lb 2 oz (113.5 kg)   BMI 33.92 kg/m   Constitutional:   Pleasant male appears to be in NAD, Well developed, Well nourished, alert and cooperative Eyes:   PEERL, EOMI. No icterus. Conjunctiva pink.  Neck:  Supple Throat: Oral cavity and pharynx without inflammation, swelling or lesion.  Respiratory: Respirations even and unlabored. Lungs clear to auscultation bilaterally.   No wheezes, crackles, or rhonchi.  Cardiovascular: Normal S1, S2. Regular rate and rhythm. No peripheral edema, cyanosis or pallor.  Gastrointestinal:  Soft, nondistended, nontender. No rebound or guarding. Normal bowel sounds. No appreciable masses or hepatomegaly. Rectal:  Not performed.  Msk:  Symmetrical without gross deformities. Without edema, no deformity or joint abnormality.  Neurologic:  Alert and  oriented x4;  grossly normal neurologically.  Skin:   Dry and intact without significant lesions or rashes.  RELEVANT LABS AND IMAGING: CBC    Latest Ref Rng & Units 09/22/2024    1:52 PM 06/06/2024   10:32 AM 02/11/2024    3:25 PM  CBC  WBC 4.0 - 10.5 K/uL 5.0  5.6  5.9   Hemoglobin 13.0 - 17.0 g/dL 86.2  85.2  85.6   Hematocrit 39.0 - 52.0 % 41.4  45.5  44.7   Platelets 150.0 - 400.0 K/uL 186.0  209  203      CMP     Latest Ref Rng & Units  09/22/2024    1:52 PM 06/06/2024   10:32 AM 02/11/2024    3:25 PM  CMP  Glucose 70 - 99 mg/dL 86  87  870   BUN 6 - 23 mg/dL 16  15  22    Creatinine 0.40 - 1.50 mg/dL 8.83  8.64  8.63   Sodium 135 - 145 mEq/L 141  144  139   Potassium 3.5 - 5.1 mEq/L 4.3  4.8  4.3   Chloride 96 - 112 mEq/L 105  106  106   CO2 19 - 32 mEq/L 29  20  21    Calcium  8.4 - 10.5 mg/dL 9.2  9.4  9.1   Total Protein 6.0 - 8.3 g/dL 6.5     Total Bilirubin 0.2 - 1.2 mg/dL 0.7     Alkaline Phos 39 - 117 U/L 100     AST 0 - 37 U/L 21     ALT 0 - 53 U/L 21        Lab Results  Component Value Date   TSH 0.75 09/22/2024     Assessment/Plan: Encounter Diagnoses  Name Primary?   Hx of colonic polyps Yes   Internal hemorrhoids    BRBPR (bright red blood per rectum)    1. History of multiple tubular adenomas, and Sessile serrated polyps, due for recall colonoscopy  -Eliquis  hold 2 days -Obtain cardiac clearance -Schedule for a colonoscopy in [facility] with Dr. San. The risks and benefits of colonoscopy with possible polypectomy / biopsies were discussed and the [name] agrees to proceed.  2) Intermittent BRBPR, internal hemorrhoids- improved with OTC Prep H -if reoccurring problem can consider hemorrhoid banding  3) Atrial flutter s/p cardioversion 4) Chronic anticoagulation 5) CHF with preserved EF, Echo (01/2024 showed EF 60-65%)  The indications, risks, and benefits of colonoscopy were explained to the [name] in detail. Risks include but are not limited to bleeding, perforation, adverse reaction to medications, and cardiopulmonary compromise. Sequelae include but are not limited to the possibility of surgery, hospitalization, and mortality. The [name] verbalized understanding and wished to proceed. All questions answered, referred to the scheduler and bowel prep ordered. Further recommendations pending results of the exam.   Thank you for the courtesy of this consult. Please call me with any questions or  concerns.   Kena Limon, FNP-C  [facility] 12/30/2024, 9:22 AM  Cc: Norleen Lynwood ORN, MD  "

## 2025-01-02 NOTE — Telephone Encounter (Signed)
" ° °  Name: [name]  DOB: [birth year]  MRN: [id number]   Primary Cardiologist: Annabella Scarce, MD  Chart reviewed as part of pre-operative protocol coverage.   Per Pharm D, patient has not had an Afib/aflutter ablation within the last 3 months, DCCV within the last 4 weeks, or Watchman in the last 45 days. Patient may hold Eliquis  for 2 days prior to procedure. Patient will not need bridging with Lovenox around procedure.     I will route this recommendation to the requesting party via Epic fax function and remove from pre-op pool. Please call with questions.  Barnie Hila, NP 01/02/2025, 11:41 AM  "

## 2025-01-02 NOTE — Telephone Encounter (Signed)
 Patient with diagnosis of atrial fibrillation on Eliquis  for anticoagulation.    What type of surgery is being performed?     Endoscopic  When is this surgery scheduled?     01/31/25    CHA2DS2-VASc Score = 3   This indicates a 3.2% annual risk of stroke. The patient's score is based upon: CHF History: 1 HTN History: 1 Diabetes History: 0 Stroke History: 0 Vascular Disease History: 0 Age Score: 1 Gender Score: 0    CrCl 94  Platelet count 186  Patient has not had an Afib/aflutter ablation in the last 3 months, DCCV within the last 4 weeks or a watchman implanted in the last 45 days    Per office protocol, patient can hold Eliquis  for 2 days prior to procedure.   Patient will not need bridging with Lovenox (enoxaparin) around procedure.  **This guidance is not considered finalized until pre-operative APP has relayed final recommendations.**

## 2025-01-03 NOTE — Telephone Encounter (Signed)
 Pt called with questions regarding clearance. Please advise. Thank you.

## 2025-01-19 ENCOUNTER — Encounter

## 2025-01-31 ENCOUNTER — Encounter: Admitting: Gastroenterology

## 2025-09-25 ENCOUNTER — Encounter: Admitting: Internal Medicine
